# Patient Record
Sex: Male | Born: 1945 | Race: Black or African American | Hispanic: No | Marital: Married | Smoking: Former smoker
Health system: Southern US, Community
[De-identification: ages and names within clinical notes are randomized; demographics above are authoritative.]

## PROBLEM LIST (undated history)

## (undated) DIAGNOSIS — I214 Non-ST elevation (NSTEMI) myocardial infarction: Secondary | ICD-10-CM

## (undated) DIAGNOSIS — N189 Chronic kidney disease, unspecified: Secondary | ICD-10-CM

## (undated) DIAGNOSIS — I6529 Occlusion and stenosis of unspecified carotid artery: Secondary | ICD-10-CM

## (undated) DIAGNOSIS — I2510 Atherosclerotic heart disease of native coronary artery without angina pectoris: Secondary | ICD-10-CM

## (undated) DIAGNOSIS — D62 Acute posthemorrhagic anemia: Secondary | ICD-10-CM

## (undated) DIAGNOSIS — E119 Type 2 diabetes mellitus without complications: Secondary | ICD-10-CM

## (undated) DIAGNOSIS — I10 Essential (primary) hypertension: Secondary | ICD-10-CM

## (undated) DIAGNOSIS — J810 Acute pulmonary edema: Secondary | ICD-10-CM

## (undated) DIAGNOSIS — Z951 Presence of aortocoronary bypass graft: Secondary | ICD-10-CM

## (undated) HISTORY — PX: APPENDECTOMY: SHX54

## (undated) HISTORY — PX: JOINT REPLACEMENT: SHX530

## (undated) HISTORY — DX: Non-ST elevation (NSTEMI) myocardial infarction: I21.4

## (undated) HISTORY — DX: Acute posthemorrhagic anemia: D62

## (undated) HISTORY — DX: Acute pulmonary edema: J81.0

## (undated) HISTORY — PX: TONSILECTOMY, ADENOIDECTOMY, BILATERAL MYRINGOTOMY AND TUBES: SHX2538

## (undated) HISTORY — DX: Presence of aortocoronary bypass graft: Z95.1

---

## 2001-10-22 ENCOUNTER — Encounter: Admission: RE | Admit: 2001-10-22 | Discharge: 2001-10-22 | Payer: Self-pay | Admitting: Orthopaedic Surgery

## 2001-10-22 ENCOUNTER — Encounter: Payer: Self-pay | Admitting: Orthopaedic Surgery

## 2001-11-05 ENCOUNTER — Encounter: Admission: RE | Admit: 2001-11-05 | Discharge: 2001-11-05 | Payer: Self-pay | Admitting: Orthopedic Surgery

## 2001-11-05 ENCOUNTER — Encounter: Payer: Self-pay | Admitting: Orthopedic Surgery

## 2001-11-22 ENCOUNTER — Encounter: Admission: RE | Admit: 2001-11-22 | Discharge: 2001-11-22 | Payer: Self-pay | Admitting: Orthopaedic Surgery

## 2001-11-22 ENCOUNTER — Encounter: Payer: Self-pay | Admitting: Orthopaedic Surgery

## 2002-03-20 ENCOUNTER — Encounter: Payer: Self-pay | Admitting: Neurosurgery

## 2002-03-22 ENCOUNTER — Inpatient Hospital Stay (HOSPITAL_COMMUNITY): Admission: RE | Admit: 2002-03-22 | Discharge: 2002-03-24 | Payer: Self-pay | Admitting: Neurosurgery

## 2002-03-22 ENCOUNTER — Encounter: Payer: Self-pay | Admitting: Neurosurgery

## 2002-04-21 ENCOUNTER — Ambulatory Visit (HOSPITAL_COMMUNITY): Admission: RE | Admit: 2002-04-21 | Discharge: 2002-04-21 | Payer: Self-pay | Admitting: Neurosurgery

## 2002-04-21 ENCOUNTER — Encounter: Payer: Self-pay | Admitting: Neurosurgery

## 2002-04-22 ENCOUNTER — Inpatient Hospital Stay (HOSPITAL_COMMUNITY): Admission: RE | Admit: 2002-04-22 | Discharge: 2002-04-24 | Payer: Self-pay | Admitting: Neurosurgery

## 2003-02-15 HISTORY — PX: LUMBAR FUSION: SHX111

## 2003-07-06 ENCOUNTER — Ambulatory Visit (HOSPITAL_COMMUNITY): Admission: RE | Admit: 2003-07-06 | Discharge: 2003-07-06 | Payer: Self-pay | Admitting: Neurosurgery

## 2003-07-22 ENCOUNTER — Inpatient Hospital Stay (HOSPITAL_COMMUNITY): Admission: RE | Admit: 2003-07-22 | Discharge: 2003-07-26 | Payer: Self-pay | Admitting: Neurosurgery

## 2006-07-24 ENCOUNTER — Encounter: Admission: RE | Admit: 2006-07-24 | Discharge: 2006-07-24 | Payer: Self-pay | Admitting: Neurosurgery

## 2006-10-06 ENCOUNTER — Inpatient Hospital Stay (HOSPITAL_COMMUNITY): Admission: RE | Admit: 2006-10-06 | Discharge: 2006-10-10 | Payer: Self-pay | Admitting: Neurosurgery

## 2008-04-10 ENCOUNTER — Ambulatory Visit (HOSPITAL_COMMUNITY): Admission: RE | Admit: 2008-04-10 | Discharge: 2008-04-10 | Payer: Self-pay | Admitting: Ophthalmology

## 2010-06-01 LAB — BASIC METABOLIC PANEL
BUN: 27 mg/dL — ABNORMAL HIGH (ref 6–23)
Chloride: 105 mEq/L (ref 96–112)
GFR calc Af Amer: 43 mL/min — ABNORMAL LOW (ref >60)
Glucose, Bld: 187 mg/dL — ABNORMAL HIGH (ref 70–99)
Sodium: 138 mEq/L (ref 135–145)

## 2010-06-01 LAB — HEMOGLOBIN AND HEMATOCRIT, BLOOD
HCT: 30.6 % — ABNORMAL LOW (ref 39.0–52.0)
Hemoglobin: 10.6 g/dL — ABNORMAL LOW (ref 13.0–17.0)

## 2010-06-01 LAB — GLUCOSE, CAPILLARY: Glucose-Capillary: 113 mg/dL — ABNORMAL HIGH (ref 70–99)

## 2010-06-29 NOTE — Op Note (Signed)
[name], [name]                 ACCOUNT NO.:  [id number]   MEDICAL RECORD NO.:  [id number]          PATIENT TYPE:  INP   LOCATION:  2899                         FACILITY:  [facility]   PHYSICIAN:  Elizabeth Sauer, M.D.      DATE OF BIRTH:  April 16, 1945   DATE OF PROCEDURE:  10/06/2006  DATE OF DISCHARGE:                               OPERATIVE REPORT   PREOPERATIVE DIAGNOSIS:  Left L5 radiculopathy.   POSTOPERATIVE DIAGNOSIS:  Left L5 radiculopathy.   OPERATIVE PROCEDURE:  Exploration of left sided of L4-L5 fusion.   SURGEON:  Elizabeth Sauer, M.D.   ASSISTANT:  Marchia Meiers. Vertell Limber, M.D.   ANESTHESIA:  General endotracheal anesthesia.   PREPARATION:  Betadine prep with alcohol wipe.   COMPLICATIONS:  None.   BODY OF TEXT:  65 year old gentleman with left L5 radiculopathy and  status post Ray cage fusion at L4-L5 five years ago.  He is taken to the  operating room, smoothly anesthetized and intubated, placed prone on the  operating table. Following shave, prep, and drape in the usual sterile  fashion, the skin was incised in the old incision and the left side was  explored out to the transverse process of L5.  X-ray confirmed  correctness of the level. Working up over the pedicle of L5 and medially  to its medial aspect, which was the old superior articular process of  L5, the high speed drill was used to remove the medial portion as a  superior articular facet.  This allowed access to the lateral aspect of  the lateral recess just medial to the most medial portion of the  pedicle. The L5 nerve root was easily identified and carefully explored  along its length. Just anterior to it was a large mass of scar and  possible disc.  This was removed without difficulty and the nerve root  explored in all quadrants and found to be open.  Following complete  exploration, the wound was irrigated, hemostasis assured, the bone  defect filled with Depo-Medrol soaked fat.  Successive layers of 2-0  Vicryl and 3-0 nylon were used to close.  Betadine and Telfa dressing  was applied and made occlusive with OpSite.  The patient returned to the  recovery room in good condition.           ______________________________  Elizabeth Sauer, M.D.     MWR/MEDQ  D:  10/06/2006  T:  10/07/2006  Job:  [id number]

## 2010-06-29 NOTE — H&P (Signed)
[name], [name]                 ACCOUNT NO.:  [id number]   MEDICAL RECORD NO.:  [id number]          PATIENT TYPE:  INP   LOCATION:  3302                         FACILITY:  [facility]   PHYSICIAN:  Elizabeth Sauer, M.D.      DATE OF BIRTH:  January 21, 1946   DATE OF ADMISSION:  10/06/2006  DATE OF DISCHARGE:                              HISTORY & PHYSICAL   ADMITTING DIAGNOSIS:  Left L5 radiculopathy.   DICTATING PHYSICIAN:  Dr. Carloyn Manner.   SERVICES:  Neurosurgery.   BODY OF TEXT:  This is a very nice, now 65 year old, right-handed, black  gentleman, who I operated on 4 years ago.  He has had 3 disc operations  for recurrences at L4-5 and in 2004 underwent an L4-5 fusion.  He did  well and then he had increasing pain in his left calf.  MR has  demonstrated a narrowing of the left lateral recess at L4-5 and he is  now admitted for decompression of the left lateral recess and  exploration of his fusion.   PAST MEDICAL HISTORY:  Remarkable for:  1. Insulin dependent diabetes.  2. Hypertension.   MEDICATIONS:  He takes:  1. Humulin 70/30, 30 units in the morning and 20 in the evening.  2. Diltiazem 300 mg once a day.  3. Doxazosin 4 mg twice a day.  4. Coreg 12.5 mg twice a day.  5. Lisinopril, hydrochlorothiazide 20/25 once a day.  6. Aspirin a day.  7. He is also on Famvir.  8. Doxycycline.  9. Zantac.   ALLERGIES:  HE IS ALLERGIC TO SULFA.   PAST SURGICAL HISTORY:  A hip replacement, 3 times on the left and once  on the right.   SOCIAL HISTORY:  He does not smoke and does not drink.  He is on  disability.   FAMILY HISTORY:  Mom is 15 and in fair health with hypertension.  Dad  deceased at 65.   REVIEW OF SYSTEMS:  Remarkable for glasses, occasional infection,  tinnitus, balance disturbance, nasal congestion, sinus problems,  headache, chest pain, hypertension, leg pain, shortness of breath,  indigestion, difficulty with bowel habits, UTIs, leg weakness, back  pain, arm pain, leg  pain, joint pain, difficulties with memory,  difficulties with speech, double vision, diabetes, increased thirst and  inhalant allergies.   PHYSICAL EXAMINATION:  HEENT EXAM:  Within normal limits.  He has  reasonable range of motion.  NECK:  Clear.  CHEST:  Clear.  CARDIAC EXAM:  Regular rate and rhythm.  ABDOMEN:  Nontender with no hepatosplenomegaly.  EXTREMITIES:  Without clubbing or cyanosis.  GU EXAM:  Deferred.  EXTREMITIES:  Peripheral pulses are good.  NEUROLOGICALLY:  He is awake, alert and oriented.  Cranial nerves are  intact.  Motor exam shows 5/5 strength throughout the upper extremities  and lower extremities strength is full.  He has a flicker left ankle  jerk.  Ankle jerk is more on the right.  Straight leg raise on the left  is positive.   His MR demonstrates a mass in the left lateral recess, just medial  to  the left L5 pedicle.   CLINICAL IMPRESSION:  Left lumbar radiculopathy.   PLAN:  Exploration of the left lateral recess at the L4-5 level.  The  risks and benefits have been discussed with him and he wished to  proceed.           ______________________________  Elizabeth Sauer, M.D.     MWR/MEDQ  D:  10/06/2006  T:  10/07/2006  Job:  [id number]

## 2010-06-29 NOTE — Discharge Summary (Signed)
NAMEBRAYSON, SHERFEY                 ACCOUNT NO.:  [id number]   MEDICAL RECORD NO.:  [id number]          PATIENT TYPE:  INP   LOCATION:  3031                         FACILITY:  [facility]   PHYSICIAN:  Elizabeth Sauer, M.D.      DATE OF BIRTH:  May 10, 1945   DATE OF ADMISSION:  10/06/2006  DATE OF DISCHARGE:  10/10/2006                               DISCHARGE SUMMARY   ADMITTING DIAGNOSIS:  Left L5 radiculopathy.   DISCHARGE DIAGNOSIS:  Left L5 radiculopathy.   PROCEDURE:  Left L4-5 foraminotomy.   SURGEON:  Dr. Carloyn Manner   COMPLICATIONS:  None.   DISCHARGE STATUS:  Alive and well.   A 65 year old right-handed black gentleman whose history and physical is  recounted in the chart.  He has had fusion at 4-5, developed left L5  radiculopathy.  MR showed entrapment of the left L5 root and he was  admitted for decompression.   HISTORY:  Just remarkable for diabetes and hypertension.   Exam was intact save for the left L5 radiculopathy.   He was admitted after ascertaining normal laboratory values and  underwent a 4-5 decompression.   Postoperatively, he has done well.  He spent one or two days in the ACU  monitoring his sugars, seem to be doing well there.  He has been up and  about walking with PT with marked diminution of his left leg pain.  His  incision is dry and well healing; his strength is full and his sugars  are good.   He is being discharged home in the care of his family with Percocet for  pain.  His followup will be in the [facility] in a week for  sutures.           ______________________________  Elizabeth Sauer, M.D.     MWR/MEDQ  D:  10/10/2006  T:  10/10/2006  Job:  [id number]

## 2010-07-02 NOTE — H&P (Signed)
NAME:  [name], [name]                           ACCOUNT NO.:  [id number]   MEDICAL RECORD NO.:  [id number]                   PATIENT TYPE:  OIB   LOCATION:  3008                                 FACILITY:  [facility]   PHYSICIAN:  Elizabeth Sauer, M.D.                   DATE OF BIRTH:  [birth year]   DATE OF ADMISSION:  04/22/2002  DATE OF DISCHARGE:                                HISTORY & PHYSICAL   ADMISSION DIAGNOSIS:  Epidural abscess.   HISTORY OF PRESENT ILLNESS:  This 65 year old right-handed black gentleman  who had discectomy a little over a month ago.  He has been doing relatively  well.  He has developed some pain in his left leg that has become  intractable.  A MRI demonstrated an enhancing lesion at L4/5 and he is  admitted for exploration of this lesion.   PAST MEDICAL HISTORY:  Remarkable for insulin-dependent diabetes mellitus,  hypertension.   MEDICATIONS:  1. He takes Humulin 70/30 with 30 in the morning and 20 in the evening.  2. Diltiazem 300 mg 1 q.d.  3. Doxazosin 4 mg b.i.d.  4. Coreg 12.5 mg b.i.d.  5. Lisinopril/hydrochlorothiazide 20/25 mg 1 q.d.  6. Aspirin q.d.  7. Legatrin.  8. Stool softener.  9. Famvir 500 mg b.i.d.  10.      Doxycycline 100 mg b.i.d. for his prostate.  11.      Zantac 75  mg p.r.n. basis.   ALLERGIES:  SULFA.   PAST SURGICAL HISTORY:  Hip replacement two or three times on the left and  once on the right.   SOCIAL HISTORY:  Does not smoke and does not drink.  He is on disability.   FAMILY HISTORY:  Mom is 69.  She has a history of hypertension.  Dad is  deceased at 25, cause was not given.   REVIEW OF SYMPTOMS:  Remarkable for glasses and occasional infection.  Sinus  problems.  Headache.  Chest pain.  Hypertension.  Leg pain.  Shortness of  breath.  Indigestion.  Difficulty with bowel habits.  Urinary tract  infection.  Insomnia.  Extreme leg weakness, back pain, arm pain, leg pain,  and joint pain.  Difficulty with memory.   Difficulty with speech and vision.  Diabetes with increased thirst.   ALLERGIES:  INHALANT ALLERGIES.   PHYSICAL EXAMINATION:  HEENT:  Normal.  NECK:  Reasonable range of motion.  BACK:  Clear.  CHEST:  Clear.  CARDIOVASCULAR:  Regular rate and rhythm.  ABDOMEN:  Nontender with no hepatosplenomegaly.  EXTREMITIES:  Without clubbing or cyanosis.  GENITOURINARY:  Deferred.  PULSES:  Good.  NEUROLOGICAL:  Awake, alert, and oriented.  Cranial nerves are intact.  Motor exam shows 5/5 strength throughout the upper and lower extremities  save for the flexion weakness on the left side.  He is still not tripping  over it.  Left foot is numb in the L5 and S1 distribution.  Reflexes are 2  at each knee, 1 to right ankle, absent at the left.   LABORATORY DATA:  MRA results have been reviewed above.   IMPRESSION:  Possible epidural abscess.   PLAN:  Re-exploration of the lumbar wound.  The risks and benefits of this  approach have been discussed and he wishes to proceed.                                               Elizabeth Sauer, M.D.    MWR/MEDQ  D:  04/22/2002  T:  04/22/2002  Job:  [id number]

## 2010-07-02 NOTE — H&P (Signed)
NAME:  JESSIAH, HIPPLE                           ACCOUNT NO.:  [id number]   MEDICAL RECORD NO.:  [id number]                   PATIENT TYPE:  INP   LOCATION:  [id number]                                 FACILITY:  [facility]   PHYSICIAN:  Elizabeth Sauer, M.D.                   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   This very  nice 65 year old right-handed black gentleman who had a  discectomy and a redo discectomy a year ago, at that time it was discussed  whether or not it was appropriate to diffuse him, he declined that.  He has  had marked increase in right leg pain, an MRI that shows another disc  recurrence.  He is now admitted for laminectomy and discectomy at 4-5.   PAST MEDICAL HISTORY:  1. Insulin dependent diabetes.  2. Hypertension.   MEDICATIONS:  1. He takes Humulin 70/30 30 in the morning and 20 in the evening.  2. Diltiazem 30 mg a day.  3. Doxycin 4 mg b.i.d.  4. Coreg 12.5 mg b.i.d.  5. Lisinopril/hydrochlorothiazide 20/25 once a day.  6. Aspirin a day.  7. Legatrin.  8. Stool softener.  9. Pamvir 500 mg b.i.d.  10.      Doxycycline 100 mg b.i.d. for prostate.  11.      Zantac 75 mg on a p.r.n. basis.   ALLERGIES:  SULFA.   PAST SURGICAL HISTORY:  Hip replacement two or three times on the left and  once on the right.   SOCIAL HISTORY:  Does not smoke.  Does not drink.  He is on disability.   FAMILY HISTORY:  Mom is 91 with history of hypertension.  Dad deceased at  51, cause not given.   REVIEW OF SYMPTOMS:  Remarkable for glasses and occasional infections, sinus  problems, headache, chest pain, hypertension, leg pain, shortness of breath,  indigestion, difficulty with bowel habits, urinary tract infection,  insomnia, leg weakness, back pain, arm pain, leg pain, joint pain,  difficulty with memory,  difficulty with speech and vision, and diabetes.   PHYSICAL EXAMINATION:  HEENT:  Within normal limits.  NECK:  He has reasonable range of motion of the neck.  CHEST:  Clear.  CARDIOVASCULAR:  Regular rate and rhythm.  ABDOMEN:  Large but nontender with no hepatosplenomegaly.  EXTREMITIES:  Without clubbing or cyanosis.  GU:  Examination is deferred.  VASCULAR:  Peripheral pulses are good.  NEUROLOGIC:  He is awake, alert and oriented. Cranial nerves are intact.  Motor examination shows 5/5 strength throughout the upper and lower  extremities save for dorsiflexion of the left side.  Left foot is numb in 4-  5 and S1 distribution.  Reflexes are 2 at  each knee, 1 at the right ankle,  absent at the left.  Straight leg raising is positive.   MR demonstrates recurrent disc at 4-5.   IMPRESSION:  Left L5 radiculopathy related to herniated disc.   PLAN:  Laminectomy, discectomy, posterior lumbar interbody fusion L4-5.  The  risks and benefits of this approach have been discussed with him and he  wishes to proceed.                                                Elizabeth Sauer, M.D.    MWR/MEDQ  D:  07/22/2003  T:  07/23/2003  Job:  [id number]

## 2010-07-02 NOTE — Op Note (Signed)
NAME:  BUN, RINNE                           ACCOUNT NO.:  [id number]   MEDICAL RECORD NO.:  [id number]                   PATIENT TYPE:  INP   LOCATION:  [id number]                                 FACILITY:  [facility]   PHYSICIAN:  Elizabeth Sauer, M.D.                   DATE OF BIRTH:  06-20-1945   DATE OF PROCEDURE:  07/22/2003  DATE OF DISCHARGE:                                 OPERATIVE REPORT   PREOPERATIVE DIAGNOSIS:  Recurrent herniated disk, L4-5.   POSTOPERATIVE DIAGNOSIS:  Recurrent herniated disk, L4-5.   OPERATIVE PROCEDURE:  L4-5 laminectomy and diskectomy, posterior lumbar  interbody fusion with Ray Threaded Fusion Cages, posterolateral arthrodesis.   SURGEON:  Elizabeth Sauer, M.D.   NURSE ASSISTANT:  [facility].   DOCTOR ASSISTANT:  Leeroy Cha, M.D.   PREPARATION:  Sterile Betadine prep and scrub with alcohol wipe.   BODY OF TEXT:  This is a 65 year old gentleman with recurrent large disk at  L4-5.  Taken to the operating room and smoothly anesthetized and intubated,  placed prone on the operating table.  Following shave, prep, and drape in  the usual sterile fashion, the skin was infiltrated with 1% lidocaine and  1:400,000 epinephrine.  The skin was incised and the old incision extended  approximately 2 cm at each end.  The laminae of L4 and L5 were exposed  bilaterally in the subperiosteal plane out over the transverse processes of  L4 and L5.  An intraoperative x-ray confirmed correctness of level.  Having  confirmed correctness of level, the pars interarticularis, lamina, and  inferior facet of L4 and the superior facet of L5 were removed bilaterally.  The pars interarticularis on the left side at the site of his old  laminectomy was fractured.  Following complete removal of these, a large  herniated disk was recovered from underneath the left-sided L5 nerve root.  The disk space was completely evacuated.  This was done from both sides.  Ray Threaded Fusion Cages 14 x  26 mm were placed and intraoperative x-ray  showed good placement of the cages.  They were packed with bone graft  harvested from the facet joint.  Morcellized allograft was then mixed with  DBX and placed in the intertransverse position following decortication with  a high-speed drill.  The fascia was reapproximated with 0 Vicryl in  interrupted fashion, the subcutaneous tissue was reapproximated with 0  Vicryl in interrupted fashion, the subcuticular tissue was reapproximated  with 3-0 Vicryl in interrupted fashion.  The skin was closed with 3-0 nylon  in running locked fashion.  A Betadine and Telfa dressing was applied and  made occlusive with OpSite, and the patient returned to the recovery room in  good condition.  Elizabeth Sauer, M.D.    MWR/MEDQ  D:  07/22/2003  T:  07/23/2003  Job:  [id number]

## 2010-07-02 NOTE — Op Note (Signed)
NAME:  [name], [name]                           ACCOUNT NO.:  [id number]   MEDICAL RECORD NO.:  [id number]                   PATIENT TYPE:  INP   LOCATION:  2899                                 FACILITY:  [facility]   PHYSICIAN:  Elizabeth Sauer, M.D.                   DATE OF BIRTH:  [birth year]   DATE OF PROCEDURE:  03/22/2002  DATE OF DISCHARGE:                                 OPERATIVE REPORT   PREOPERATIVE DIAGNOSIS:  Herniated disk on the left side at L4-5.   POSTOPERATIVE DIAGNOSIS:  Herniated disk on the left side at L4-5.   PROCEDURE:  Left L4-5 laminectomy and diskectomy.   SURGEON:  Elizabeth Sauer, M.D.   ASSISTANTS:  Nurse assistant:  Chyrl Civatte.  Doctor assistant:  Ophelia Charter, M.D.   ANESTHESIA:  General endotracheal.   PREPARATION:  Sterile Betadine prep and scrub with alcohol wipe.   COMPLICATIONS:  None.   DESCRIPTION OF PROCEDURE:  This is a 65 year old gentleman with a left L5  radiculopathy secondary to a herniated disk and spondylosis.  Taken to the  operating room and smoothly anesthetized and intubated, placed prone on the  operating table.  Following shave, prep, and drape in the usual sterile  fashion, the skin was infiltrated with 1% lidocaine and 1:400,000  epinephrine.  The skin was incised from mid-L4 to mid-L5.  The lamina of L4  was dissected free on the left side.  Intraoperative x-ray confirmed  correctness of level.  Hemisemilaminectomy of L5 was carried out to the top  of the ligamentum flavum that was removed in a retrograde fashion.  This  uncovered the lateral aspect of the left L5 root.  This was retracted  medially.  Immediately obvious was degenerated disk with a bulging annulus  and some protrusion of nuclear material through the annular fibers.  The  remaining annular fibers were divided and the disk space evacuated with the  [facility] pituitary instrumentation.  The end plates were gently curetted to  dislodge any marginally clinging  fragments.  Having completed evacuation of  all graspable fragments from the disk space, the nerve root was carefully  explored in all quadrants and found to be free, as was the anterior epidural  space.  The wound was irrigated and hemostasis assured.  The laminectomy  defect filled with Depo-Medrol-soaked fat.  The fascia was approximated with  0 Vicryl in an interrupted fashion, the subcutaneous tissue was  reapproximated with 0 Vicryl in interrupted fashion, the subcuticular tissue  was reapproximated with 2-0 Vicryl in interrupted fashion.  The skin was  closed with 3-0 nylon in a running locked fashion.  A Betadine and Telfa  dressing was applied and made occlusive with OpSite and the patient returned  to the recovery room in good condition.  Elizabeth Sauer, M.D.    MWR/MEDQ  D:  03/22/2002  T:  03/23/2002  Job:  [id number]

## 2010-07-02 NOTE — H&P (Signed)
NAME:  [name], [name]                           ACCOUNT NO.:  [id number]   MEDICAL RECORD NO.:  [id number]                   PATIENT TYPE:  INP   LOCATION:  2899                                 FACILITY:  [facility]   PHYSICIAN:  Elizabeth Sauer, M.D.                   DATE OF BIRTH:  04-Sep-1945   DATE OF ADMISSION:  03/22/2002  DATE OF DISCHARGE:                                HISTORY & PHYSICAL   ADMISSION DIAGNOSIS:  Herniated disk L4-5.   HISTORY AND PHYSICAL:  This is a very nice 65 year old right handed black  gentleman who has pain in his left leg that has been going on for about a  year.  He can hardly get up and walk with it.  He has had a hip replacement  several times on that side.  This is different, associated with numbness on  the right side, however, he has had a hip replacement and it does not seem  to trouble him as much.  MRI shows lumbar disk at L4-5 and he was referred  to me.   PAST MEDICAL HISTORY:  Remarkable for insulin-dependent diabetes mellitus  and hypertension.   MEDICATIONS:  Humulin 70/30 30 in the morning and 20 in the evening,  Diltiazem 300 mg once a day, Doxazocin 4 mg b.i.d., Coreg 12.5 mg b.i.d.,  Lisinopril-hydrochlorothiazide 20/25 once a day, an aspirin a day, Legatrin  stool softener, Dulcolax, Famvir b.i.d. for genital herpes, Doxycycline 100  mg b.i.d. for his prostate, Zantac 75 mg on a p.r.n. basis.   ALLERGIES:  Sulfa.   SURGICAL HISTORY:  Hip replacement two or three times on the left and once  on the right.   SOCIAL HISTORY:  He does not smoke and does not drink.  He is on disability.   FAMILY HISTORY:  His Mom is 69 and has hypertension.  Dad is deceased at 59,  cause is not given.   REVIEW OF SYSTEMS:  Remarkable for glasses.  Occasional infection.  Tinnitus.  Bouts of nasal congestion, sinus problems with headache, chest  pain, hypertension, leg pain, shortness of breath, indigestion, difficulty  with urinary tract infections,  starting or stopping stream, leg weakness,  back pain, arm pain, leg pain, joint pain, difficulty with memory,  difficulty with speech.  Diabetes, increased thirst and inhalant allergies.   PHYSICAL EXAMINATION:   HEENT:  Within normal limits.   NECK:  He has reasonable range of motion of his neck.   CHEST:  Clear.   CARDIAC:  Exam is regular rate and rhythm.   ABDOMEN:  Nontender with no hepatosplenomegaly.   EXTREMITIES:  Without clubbing, cyanosis or edema.  Peripheral pulses are  good.   GU EXAM:  Deferred.   NEUROLOGIC:  He is awake, alert and oriented.  Cranial nerves are intact.  Motor exam shows 5/5 strength throughout upper and lower extremities.  Hindered  dorsiflexion movements on the left side with no notice of tripping  over it.  The left foot is numb in an L5-S1 distribution, reflexes are 2 at  each knee, one at the right ankle, absent at the left ankle.   Plain films demonstrate good preservation of disk space.  However, MRA  demonstrates disk protrusion centrally with some accompanying center  arthropathy causing left foraminal narrowing.   CLINICAL IMPRESSION:  Left L5 radiculopathy.   PLAN:  He has little back pain, I do not think this is reason for a fusion.  He has not done well with epidural steroids so the plan is for a lumbar  laminectomy, diskectomy on the left side.  The risks and benefits of this  approach have been discussed with him and he wishes to proceed.                                                 Elizabeth Sauer, M.D.    MWR/MEDQ  D:  03/22/2002  T:  03/22/2002  Job:  [id number]

## 2010-07-02 NOTE — Discharge Summary (Signed)
NAME:  ENGLAND, SCHWIESOW                           ACCOUNT NO.:  [id number]   MEDICAL RECORD NO.:  [id number]                   PATIENT TYPE:  INP   LOCATION:  3030                                 FACILITY:  [facility]   PHYSICIAN:  Elizabeth Sauer, M.D.                   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   PROCEDURES:  L4-5 laminectomy and diskectomy, posterior interbody fusion  with Ray fusion cages, and posterolateral arthrodesis.   COMPLICATIONS:  None.   DISCHARGE STATUS:  Alive and well.   HISTORY OF PRESENT ILLNESS:  A 65 year old, right-handed, black gentleman,  whose history and physical is recounted on the chart.  He has had several  disk operations and has a large recurrence at L4-5 with difficulty with its  gait and bilateral dorsiflexion weakness.  He is admitted with fusion.   PAST MEDICAL HISTORY:  Recounted on the chart.  Basically has insulin-  dependent diabetes and hypertension.  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   MEDICATIONS:  He is on Humulin and diltiazem.   ALLERGIES:  SULFA.   PHYSICAL EXAMINATION:  General exam was intact with bilateral hip  replacements.  On neurologic exam, he had dorsiflexion and weakness of both  feet.   HOSPITAL COURSE:  He was admitted after ascertainment of acceptable  laboratory values.  He was taken to the operating room and under general  anesthesia underwent an L3-4 diskectomy and fusion.  Postoperatively he has  done well save for difficulty with voiding.  The Foley was removed.  He was  unable to void.  It was placed.  I consulted urology, who recommended  sending him home with a leg bag and contact his urologist, Dr. Janice Norrie, upon  discharge.   DISPOSITION:  He is now being discharged  home with oral medications, having  completed physical therapy, competent with his activities of daily living,  and eating and voiding normally.   DISCHARGE MEDICATIONS:  He is being discharged home with Percocet for pain.   FOLLOWUP:  His followup will be in the [facility]  office in about a week for suture removal.                                                Elizabeth Sauer, M.D.    MWR/MEDQ  D:  07/26/2003  T:  07/27/2003  Job:  [id number]

## 2010-07-02 NOTE — Op Note (Signed)
NAME:  [name], [name]                             ACCOUNT NO.:  [id number]   MEDICAL RECORD NO.:  [id number]                   PATIENT TYPE:  OIB   LOCATION:  3008                                 FACILITY:  [facility]   PHYSICIAN:  Elizabeth Sauer, M.D.                   DATE OF BIRTH:  1945-11-12   DATE OF PROCEDURE:  04/22/2002  DATE OF DISCHARGE:                                 OPERATIVE REPORT   PREOPERATIVE DIAGNOSIS:  Left-sided spinal epidural abscess.   POSTOPERATIVE DIAGNOSIS:  Recurrent herniated disk on the left side at L4-5.   OPERATION PERFORMED:  Exploration of left L4-5 laminectomy.   SURGEON:  Elizabeth Sauer, M.D.   ANESTHESIA:  General endotracheal.   PREP:  Sterile Betadine prep and scrub with alcohol wipe.   COMPLICATIONS:  None.   ASSISTANT:  [place].   INDICATIONS FOR PROCEDURE:  The patient is a 65 year old gentleman who had a  4-5 diskectomy done about a month.  She has had increasing pain in his left  leg and had a question of an epidural abscess.   DESCRIPTION OF PROCEDURE:  The patient was taken to the operating room,  smoothly anesthetized and placed  prone on the operating table.  Following  shave, prep and drape in the usual sterile fashion, the old skin incision  was reopened and the scar tissue rapidly dissected down to the laminectomy  at L4-5 on the left side.  The laminectomy site was carefully delineated,  dissected free.  The epidural fat patch was removed and the dura was  immediately evident.  No pus was encountered.  Gentle dissection and lysis  of scar of the dura from the laminectomy defect permitted slight extension  of laminectomy superiorly.  Identifying the top of the L5 nerve root,  careful dissection was carried down the nerve root with lysis of underlying  material.  At no time was any pus encountered.  Following dissection, the  nerve root was retracted medially and a small appearing piece of disk  material was evident under the nerve  root.  It was grasped with the Hartman  forceps and removed and was productive of a thumbnail size piece of disk  that extended above the disk space.  Exploring inferior to the disk space,  similar piece of disk material was found.  The disk space itself had several  fragments which were removed without difficulty.  The entire anterior  epidural space along the nerve root was then carefully explored and found to  be free and the nerve root was found to be free as it traversed the pedicle.  The wound was irrigated and hemostasis assured.  Depo-Medrol soaked fat was  once again placed in the laminectomy defect.  The fascia was reapproximated  with 0 Vicryl in running and  interrupted fashion.  Subcutaneous tissue was reapproximated with 0 Vicryl  in  interrupted fashion.  The skin was closed with 3-0 nylon in a running  locked fashion.  Betadine Telfa dressing was applied.  The patient had a  Foley catheter placed and returned to the recovery room in good condition.                                                Elizabeth Sauer, M.D.    MWR/MEDQ  D:  04/22/2002  T:  04/23/2002  Job:  [id number]

## 2010-11-26 LAB — URINE MICROSCOPIC-ADD ON

## 2010-11-26 LAB — TYPE AND SCREEN: ABO/RH(D): A POS

## 2010-11-26 LAB — DIFFERENTIAL
Basophils Absolute: 0
Basophils Relative: 1
Eosinophils Absolute: 0.4
Eosinophils Relative: 6 — ABNORMAL HIGH
Monocytes Absolute: 0.5
Neutro Abs: 4
Neutrophils Relative %: 63

## 2010-11-26 LAB — COMPREHENSIVE METABOLIC PANEL
Alkaline Phosphatase: 51
BUN: 20
Calcium: 9.6
Chloride: 103
Creatinine, Ser: 1.8 — ABNORMAL HIGH
GFR calc Af Amer: 47 — ABNORMAL LOW
Glucose, Bld: 142 — ABNORMAL HIGH
Sodium: 137
Total Bilirubin: 0.4

## 2010-11-26 LAB — CBC
MCV: 90.5
Platelets: 244

## 2010-11-26 LAB — URINALYSIS, ROUTINE W REFLEX MICROSCOPIC

## 2010-11-26 LAB — POCT I-STAT GLUCOSE: Operator id: 122891

## 2010-11-26 LAB — PROTIME-INR: INR: 1

## 2012-11-28 ENCOUNTER — Other Ambulatory Visit (HOSPITAL_COMMUNITY): Payer: Self-pay | Admitting: Internal Medicine

## 2012-11-28 DIAGNOSIS — R0989 Other specified symptoms and signs involving the circulatory and respiratory systems: Secondary | ICD-10-CM

## 2012-12-24 ENCOUNTER — Ambulatory Visit (HOSPITAL_COMMUNITY)
Admission: RE | Admit: 2012-12-24 | Discharge: 2012-12-24 | Disposition: A | Discharge status: Home / Self Care | Payer: Medicare Other | Source: Ambulatory Visit | Attending: Internal Medicine | Admitting: Internal Medicine

## 2012-12-24 DIAGNOSIS — I379 Nonrheumatic pulmonary valve disorder, unspecified: Secondary | ICD-10-CM | POA: Insufficient documentation

## 2012-12-24 DIAGNOSIS — R0989 Other specified symptoms and signs involving the circulatory and respiratory systems: Secondary | ICD-10-CM

## 2012-12-24 DIAGNOSIS — I517 Cardiomegaly: Secondary | ICD-10-CM

## 2012-12-24 DIAGNOSIS — R0609 Other forms of dyspnea: Secondary | ICD-10-CM | POA: Insufficient documentation

## 2012-12-24 DIAGNOSIS — I079 Rheumatic tricuspid valve disease, unspecified: Secondary | ICD-10-CM | POA: Insufficient documentation

## 2012-12-24 NOTE — Progress Notes (Signed)
VASCULAR LAB PRELIMINARY  PRELIMINARY  PRELIMINARY  PRELIMINARY  Carotid Dopplers completed.    Preliminary report:  1-39% ICA stenosis.  Vertebral artery flow is antegrade.  Ticara Waner, RVT 12/24/2012, 11:02 AM

## 2012-12-24 NOTE — Progress Notes (Signed)
*  PRELIMINARY RESULTS* Echocardiogram 2D Echocardiogram has been performed.  Leavy Cella 12/24/2012, 12:22 PM

## 2013-04-01 ENCOUNTER — Other Ambulatory Visit (HOSPITAL_COMMUNITY): Payer: Self-pay | Admitting: Nephrology

## 2013-04-01 ENCOUNTER — Ambulatory Visit (HOSPITAL_COMMUNITY)
Admission: RE | Admit: 2013-04-01 | Discharge: 2013-04-01 | Disposition: A | Discharge status: Home / Self Care | Payer: Medicare Other | Source: Ambulatory Visit | Attending: Surgery | Admitting: Surgery

## 2013-04-01 DIAGNOSIS — I701 Atherosclerosis of renal artery: Secondary | ICD-10-CM | POA: Insufficient documentation

## 2013-04-01 DIAGNOSIS — N189 Chronic kidney disease, unspecified: Secondary | ICD-10-CM

## 2013-04-01 DIAGNOSIS — Q619 Cystic kidney disease, unspecified: Secondary | ICD-10-CM | POA: Insufficient documentation

## 2013-04-01 DIAGNOSIS — N19 Unspecified kidney failure: Secondary | ICD-10-CM | POA: Insufficient documentation

## 2013-04-03 ENCOUNTER — Encounter: Payer: Self-pay | Admitting: Nephrology

## 2014-02-17 DIAGNOSIS — I129 Hypertensive chronic kidney disease with stage 1 through stage 4 chronic kidney disease, or unspecified chronic kidney disease: Secondary | ICD-10-CM | POA: Diagnosis not present

## 2014-02-17 DIAGNOSIS — E859 Amyloidosis, unspecified: Secondary | ICD-10-CM | POA: Diagnosis not present

## 2014-02-17 DIAGNOSIS — R809 Proteinuria, unspecified: Secondary | ICD-10-CM | POA: Diagnosis not present

## 2014-02-17 DIAGNOSIS — N183 Chronic kidney disease, stage 3 (moderate): Secondary | ICD-10-CM | POA: Diagnosis not present

## 2014-02-17 DIAGNOSIS — Z79899 Other long term (current) drug therapy: Secondary | ICD-10-CM | POA: Diagnosis not present

## 2014-02-17 DIAGNOSIS — D649 Anemia, unspecified: Secondary | ICD-10-CM | POA: Diagnosis not present

## 2014-02-18 DIAGNOSIS — I10 Essential (primary) hypertension: Secondary | ICD-10-CM | POA: Diagnosis not present

## 2014-02-18 DIAGNOSIS — D509 Iron deficiency anemia, unspecified: Secondary | ICD-10-CM | POA: Diagnosis not present

## 2014-02-18 DIAGNOSIS — E1129 Type 2 diabetes mellitus with other diabetic kidney complication: Secondary | ICD-10-CM | POA: Diagnosis not present

## 2014-02-18 DIAGNOSIS — N183 Chronic kidney disease, stage 3 (moderate): Secondary | ICD-10-CM | POA: Diagnosis not present

## 2014-04-03 DIAGNOSIS — R351 Nocturia: Secondary | ICD-10-CM | POA: Diagnosis not present

## 2014-04-03 DIAGNOSIS — R350 Frequency of micturition: Secondary | ICD-10-CM | POA: Diagnosis not present

## 2014-04-03 DIAGNOSIS — N401 Enlarged prostate with lower urinary tract symptoms: Secondary | ICD-10-CM | POA: Diagnosis not present

## 2014-04-21 DIAGNOSIS — I10 Essential (primary) hypertension: Secondary | ICD-10-CM | POA: Diagnosis not present

## 2014-04-21 DIAGNOSIS — Z125 Encounter for screening for malignant neoplasm of prostate: Secondary | ICD-10-CM | POA: Diagnosis not present

## 2014-04-21 DIAGNOSIS — D638 Anemia in other chronic diseases classified elsewhere: Secondary | ICD-10-CM | POA: Diagnosis not present

## 2014-04-21 DIAGNOSIS — E119 Type 2 diabetes mellitus without complications: Secondary | ICD-10-CM | POA: Diagnosis not present

## 2014-04-21 DIAGNOSIS — E039 Hypothyroidism, unspecified: Secondary | ICD-10-CM | POA: Diagnosis not present

## 2014-04-21 DIAGNOSIS — D649 Anemia, unspecified: Secondary | ICD-10-CM | POA: Diagnosis not present

## 2014-05-07 DIAGNOSIS — H2511 Age-related nuclear cataract, right eye: Secondary | ICD-10-CM | POA: Diagnosis not present

## 2014-05-07 DIAGNOSIS — H35372 Puckering of macula, left eye: Secondary | ICD-10-CM | POA: Diagnosis not present

## 2014-05-07 DIAGNOSIS — Z961 Presence of intraocular lens: Secondary | ICD-10-CM | POA: Diagnosis not present

## 2014-05-07 DIAGNOSIS — E119 Type 2 diabetes mellitus without complications: Secondary | ICD-10-CM | POA: Diagnosis not present

## 2014-05-16 DIAGNOSIS — E162 Hypoglycemia, unspecified: Secondary | ICD-10-CM | POA: Diagnosis not present

## 2014-05-16 DIAGNOSIS — R404 Transient alteration of awareness: Secondary | ICD-10-CM | POA: Diagnosis not present

## 2014-05-19 DIAGNOSIS — N183 Chronic kidney disease, stage 3 (moderate): Secondary | ICD-10-CM | POA: Diagnosis not present

## 2014-05-19 DIAGNOSIS — I129 Hypertensive chronic kidney disease with stage 1 through stage 4 chronic kidney disease, or unspecified chronic kidney disease: Secondary | ICD-10-CM | POA: Diagnosis not present

## 2014-05-19 DIAGNOSIS — R809 Proteinuria, unspecified: Secondary | ICD-10-CM | POA: Diagnosis not present

## 2014-05-19 DIAGNOSIS — E559 Vitamin D deficiency, unspecified: Secondary | ICD-10-CM | POA: Diagnosis not present

## 2014-05-19 DIAGNOSIS — Z79899 Other long term (current) drug therapy: Secondary | ICD-10-CM | POA: Diagnosis not present

## 2014-05-19 DIAGNOSIS — D649 Anemia, unspecified: Secondary | ICD-10-CM | POA: Diagnosis not present

## 2014-05-20 DIAGNOSIS — E1129 Type 2 diabetes mellitus with other diabetic kidney complication: Secondary | ICD-10-CM | POA: Diagnosis not present

## 2014-05-20 DIAGNOSIS — I509 Heart failure, unspecified: Secondary | ICD-10-CM | POA: Diagnosis not present

## 2014-05-20 DIAGNOSIS — N183 Chronic kidney disease, stage 3 (moderate): Secondary | ICD-10-CM | POA: Diagnosis not present

## 2014-05-20 DIAGNOSIS — I10 Essential (primary) hypertension: Secondary | ICD-10-CM | POA: Diagnosis not present

## 2014-05-20 DIAGNOSIS — D509 Iron deficiency anemia, unspecified: Secondary | ICD-10-CM | POA: Diagnosis not present

## 2014-07-17 DIAGNOSIS — I129 Hypertensive chronic kidney disease with stage 1 through stage 4 chronic kidney disease, or unspecified chronic kidney disease: Secondary | ICD-10-CM | POA: Diagnosis not present

## 2014-07-17 DIAGNOSIS — R809 Proteinuria, unspecified: Secondary | ICD-10-CM | POA: Diagnosis not present

## 2014-07-17 DIAGNOSIS — Z79899 Other long term (current) drug therapy: Secondary | ICD-10-CM | POA: Diagnosis not present

## 2014-07-17 DIAGNOSIS — N183 Chronic kidney disease, stage 3 (moderate): Secondary | ICD-10-CM | POA: Diagnosis not present

## 2014-07-17 DIAGNOSIS — D649 Anemia, unspecified: Secondary | ICD-10-CM | POA: Diagnosis not present

## 2014-07-17 DIAGNOSIS — E859 Amyloidosis, unspecified: Secondary | ICD-10-CM | POA: Diagnosis not present

## 2014-07-22 DIAGNOSIS — E1129 Type 2 diabetes mellitus with other diabetic kidney complication: Secondary | ICD-10-CM | POA: Diagnosis not present

## 2014-07-22 DIAGNOSIS — I10 Essential (primary) hypertension: Secondary | ICD-10-CM | POA: Diagnosis not present

## 2014-07-22 DIAGNOSIS — D509 Iron deficiency anemia, unspecified: Secondary | ICD-10-CM | POA: Diagnosis not present

## 2014-07-22 DIAGNOSIS — N183 Chronic kidney disease, stage 3 (moderate): Secondary | ICD-10-CM | POA: Diagnosis not present

## 2014-08-20 DIAGNOSIS — I10 Essential (primary) hypertension: Secondary | ICD-10-CM | POA: Diagnosis not present

## 2014-08-20 DIAGNOSIS — N183 Chronic kidney disease, stage 3 (moderate): Secondary | ICD-10-CM | POA: Diagnosis not present

## 2014-08-20 DIAGNOSIS — D649 Anemia, unspecified: Secondary | ICD-10-CM | POA: Diagnosis not present

## 2014-08-20 DIAGNOSIS — E039 Hypothyroidism, unspecified: Secondary | ICD-10-CM | POA: Diagnosis not present

## 2014-08-20 DIAGNOSIS — E119 Type 2 diabetes mellitus without complications: Secondary | ICD-10-CM | POA: Diagnosis not present

## 2014-08-25 DIAGNOSIS — M47816 Spondylosis without myelopathy or radiculopathy, lumbar region: Secondary | ICD-10-CM | POA: Diagnosis not present

## 2014-10-15 DIAGNOSIS — R809 Proteinuria, unspecified: Secondary | ICD-10-CM | POA: Diagnosis not present

## 2014-10-15 DIAGNOSIS — E559 Vitamin D deficiency, unspecified: Secondary | ICD-10-CM | POA: Diagnosis not present

## 2014-10-15 DIAGNOSIS — I129 Hypertensive chronic kidney disease with stage 1 through stage 4 chronic kidney disease, or unspecified chronic kidney disease: Secondary | ICD-10-CM | POA: Diagnosis not present

## 2014-10-15 DIAGNOSIS — D649 Anemia, unspecified: Secondary | ICD-10-CM | POA: Diagnosis not present

## 2014-10-15 DIAGNOSIS — N183 Chronic kidney disease, stage 3 (moderate): Secondary | ICD-10-CM | POA: Diagnosis not present

## 2014-10-15 DIAGNOSIS — Z79899 Other long term (current) drug therapy: Secondary | ICD-10-CM | POA: Diagnosis not present

## 2014-10-21 DIAGNOSIS — I10 Essential (primary) hypertension: Secondary | ICD-10-CM | POA: Diagnosis not present

## 2014-10-21 DIAGNOSIS — R809 Proteinuria, unspecified: Secondary | ICD-10-CM | POA: Diagnosis not present

## 2014-10-21 DIAGNOSIS — E1129 Type 2 diabetes mellitus with other diabetic kidney complication: Secondary | ICD-10-CM | POA: Diagnosis not present

## 2014-10-21 DIAGNOSIS — N183 Chronic kidney disease, stage 3 (moderate): Secondary | ICD-10-CM | POA: Diagnosis not present

## 2014-10-21 DIAGNOSIS — D649 Anemia, unspecified: Secondary | ICD-10-CM | POA: Diagnosis not present

## 2014-11-19 DIAGNOSIS — I10 Essential (primary) hypertension: Secondary | ICD-10-CM | POA: Diagnosis not present

## 2014-11-19 DIAGNOSIS — E039 Hypothyroidism, unspecified: Secondary | ICD-10-CM | POA: Diagnosis not present

## 2014-11-19 DIAGNOSIS — Z23 Encounter for immunization: Secondary | ICD-10-CM | POA: Diagnosis not present

## 2014-11-19 DIAGNOSIS — E119 Type 2 diabetes mellitus without complications: Secondary | ICD-10-CM | POA: Diagnosis not present

## 2014-11-20 DIAGNOSIS — N184 Chronic kidney disease, stage 4 (severe): Secondary | ICD-10-CM | POA: Diagnosis not present

## 2014-11-21 DIAGNOSIS — H2511 Age-related nuclear cataract, right eye: Secondary | ICD-10-CM | POA: Diagnosis not present

## 2014-11-21 DIAGNOSIS — H35373 Puckering of macula, bilateral: Secondary | ICD-10-CM | POA: Diagnosis not present

## 2014-11-21 DIAGNOSIS — Z794 Long term (current) use of insulin: Secondary | ICD-10-CM | POA: Diagnosis not present

## 2014-11-21 DIAGNOSIS — E119 Type 2 diabetes mellitus without complications: Secondary | ICD-10-CM | POA: Diagnosis not present

## 2014-12-04 DIAGNOSIS — N184 Chronic kidney disease, stage 4 (severe): Secondary | ICD-10-CM | POA: Diagnosis not present

## 2014-12-18 DIAGNOSIS — N184 Chronic kidney disease, stage 4 (severe): Secondary | ICD-10-CM | POA: Diagnosis not present

## 2014-12-18 DIAGNOSIS — D509 Iron deficiency anemia, unspecified: Secondary | ICD-10-CM | POA: Diagnosis not present

## 2014-12-31 DIAGNOSIS — E039 Hypothyroidism, unspecified: Secondary | ICD-10-CM | POA: Diagnosis not present

## 2014-12-31 DIAGNOSIS — I10 Essential (primary) hypertension: Secondary | ICD-10-CM | POA: Diagnosis not present

## 2014-12-31 DIAGNOSIS — E119 Type 2 diabetes mellitus without complications: Secondary | ICD-10-CM | POA: Diagnosis not present

## 2014-12-31 DIAGNOSIS — D649 Anemia, unspecified: Secondary | ICD-10-CM | POA: Diagnosis not present

## 2015-01-01 DIAGNOSIS — N184 Chronic kidney disease, stage 4 (severe): Secondary | ICD-10-CM | POA: Diagnosis not present

## 2015-01-01 DIAGNOSIS — D509 Iron deficiency anemia, unspecified: Secondary | ICD-10-CM | POA: Diagnosis not present

## 2015-01-15 DIAGNOSIS — Z79899 Other long term (current) drug therapy: Secondary | ICD-10-CM | POA: Diagnosis not present

## 2015-01-15 DIAGNOSIS — D509 Iron deficiency anemia, unspecified: Secondary | ICD-10-CM | POA: Diagnosis not present

## 2015-01-15 DIAGNOSIS — N184 Chronic kidney disease, stage 4 (severe): Secondary | ICD-10-CM | POA: Diagnosis not present

## 2015-01-15 DIAGNOSIS — R809 Proteinuria, unspecified: Secondary | ICD-10-CM | POA: Diagnosis not present

## 2015-01-15 DIAGNOSIS — E559 Vitamin D deficiency, unspecified: Secondary | ICD-10-CM | POA: Diagnosis not present

## 2015-01-29 DIAGNOSIS — Z79899 Other long term (current) drug therapy: Secondary | ICD-10-CM | POA: Diagnosis not present

## 2015-01-29 DIAGNOSIS — R809 Proteinuria, unspecified: Secondary | ICD-10-CM | POA: Diagnosis not present

## 2015-01-29 DIAGNOSIS — D509 Iron deficiency anemia, unspecified: Secondary | ICD-10-CM | POA: Diagnosis not present

## 2015-01-29 DIAGNOSIS — E559 Vitamin D deficiency, unspecified: Secondary | ICD-10-CM | POA: Diagnosis not present

## 2015-01-29 DIAGNOSIS — N184 Chronic kidney disease, stage 4 (severe): Secondary | ICD-10-CM | POA: Diagnosis not present

## 2015-02-19 DIAGNOSIS — D509 Iron deficiency anemia, unspecified: Secondary | ICD-10-CM | POA: Diagnosis not present

## 2015-02-19 DIAGNOSIS — N184 Chronic kidney disease, stage 4 (severe): Secondary | ICD-10-CM | POA: Diagnosis not present

## 2015-03-05 DIAGNOSIS — D509 Iron deficiency anemia, unspecified: Secondary | ICD-10-CM | POA: Diagnosis not present

## 2015-03-05 DIAGNOSIS — N184 Chronic kidney disease, stage 4 (severe): Secondary | ICD-10-CM | POA: Diagnosis not present

## 2015-03-19 DIAGNOSIS — Z79899 Other long term (current) drug therapy: Secondary | ICD-10-CM | POA: Diagnosis not present

## 2015-03-19 DIAGNOSIS — E559 Vitamin D deficiency, unspecified: Secondary | ICD-10-CM | POA: Diagnosis not present

## 2015-03-19 DIAGNOSIS — R809 Proteinuria, unspecified: Secondary | ICD-10-CM | POA: Diagnosis not present

## 2015-03-19 DIAGNOSIS — N184 Chronic kidney disease, stage 4 (severe): Secondary | ICD-10-CM | POA: Diagnosis not present

## 2015-04-02 DIAGNOSIS — Z79899 Other long term (current) drug therapy: Secondary | ICD-10-CM | POA: Diagnosis not present

## 2015-04-02 DIAGNOSIS — E559 Vitamin D deficiency, unspecified: Secondary | ICD-10-CM | POA: Diagnosis not present

## 2015-04-02 DIAGNOSIS — N184 Chronic kidney disease, stage 4 (severe): Secondary | ICD-10-CM | POA: Diagnosis not present

## 2015-04-02 DIAGNOSIS — R809 Proteinuria, unspecified: Secondary | ICD-10-CM | POA: Diagnosis not present

## 2015-04-06 DIAGNOSIS — N401 Enlarged prostate with lower urinary tract symptoms: Secondary | ICD-10-CM | POA: Diagnosis not present

## 2015-04-06 DIAGNOSIS — N138 Other obstructive and reflux uropathy: Secondary | ICD-10-CM | POA: Diagnosis not present

## 2015-04-07 DIAGNOSIS — D649 Anemia, unspecified: Secondary | ICD-10-CM | POA: Diagnosis not present

## 2015-04-07 DIAGNOSIS — N184 Chronic kidney disease, stage 4 (severe): Secondary | ICD-10-CM | POA: Diagnosis not present

## 2015-04-07 DIAGNOSIS — E1129 Type 2 diabetes mellitus with other diabetic kidney complication: Secondary | ICD-10-CM | POA: Diagnosis not present

## 2015-04-07 DIAGNOSIS — R809 Proteinuria, unspecified: Secondary | ICD-10-CM | POA: Diagnosis not present

## 2015-04-07 DIAGNOSIS — I10 Essential (primary) hypertension: Secondary | ICD-10-CM | POA: Diagnosis not present

## 2015-04-08 DIAGNOSIS — E119 Type 2 diabetes mellitus without complications: Secondary | ICD-10-CM | POA: Diagnosis not present

## 2015-04-08 DIAGNOSIS — M10012 Idiopathic gout, left shoulder: Secondary | ICD-10-CM | POA: Diagnosis not present

## 2015-04-08 DIAGNOSIS — D649 Anemia, unspecified: Secondary | ICD-10-CM | POA: Diagnosis not present

## 2015-04-08 DIAGNOSIS — I10 Essential (primary) hypertension: Secondary | ICD-10-CM | POA: Diagnosis not present

## 2015-04-08 DIAGNOSIS — N189 Chronic kidney disease, unspecified: Secondary | ICD-10-CM | POA: Diagnosis not present

## 2015-04-08 DIAGNOSIS — E039 Hypothyroidism, unspecified: Secondary | ICD-10-CM | POA: Diagnosis not present

## 2015-04-08 DIAGNOSIS — Z125 Encounter for screening for malignant neoplasm of prostate: Secondary | ICD-10-CM | POA: Diagnosis not present

## 2015-04-08 DIAGNOSIS — E109 Type 1 diabetes mellitus without complications: Secondary | ICD-10-CM | POA: Diagnosis not present

## 2015-04-08 DIAGNOSIS — M87059 Idiopathic aseptic necrosis of unspecified femur: Secondary | ICD-10-CM | POA: Diagnosis not present

## 2015-06-11 DIAGNOSIS — N184 Chronic kidney disease, stage 4 (severe): Secondary | ICD-10-CM | POA: Diagnosis not present

## 2015-06-25 DIAGNOSIS — N184 Chronic kidney disease, stage 4 (severe): Secondary | ICD-10-CM | POA: Diagnosis not present

## 2015-07-09 DIAGNOSIS — N184 Chronic kidney disease, stage 4 (severe): Secondary | ICD-10-CM | POA: Diagnosis not present

## 2015-07-21 DIAGNOSIS — E039 Hypothyroidism, unspecified: Secondary | ICD-10-CM | POA: Diagnosis not present

## 2015-07-21 DIAGNOSIS — I10 Essential (primary) hypertension: Secondary | ICD-10-CM | POA: Diagnosis not present

## 2015-07-21 DIAGNOSIS — N189 Chronic kidney disease, unspecified: Secondary | ICD-10-CM | POA: Diagnosis not present

## 2015-07-21 DIAGNOSIS — E119 Type 2 diabetes mellitus without complications: Secondary | ICD-10-CM | POA: Diagnosis not present

## 2015-07-21 DIAGNOSIS — D649 Anemia, unspecified: Secondary | ICD-10-CM | POA: Diagnosis not present

## 2015-07-21 DIAGNOSIS — E1165 Type 2 diabetes mellitus with hyperglycemia: Secondary | ICD-10-CM | POA: Diagnosis not present

## 2015-07-23 DIAGNOSIS — N184 Chronic kidney disease, stage 4 (severe): Secondary | ICD-10-CM | POA: Diagnosis not present

## 2015-08-06 DIAGNOSIS — N184 Chronic kidney disease, stage 4 (severe): Secondary | ICD-10-CM | POA: Diagnosis not present

## 2015-08-20 DIAGNOSIS — Z79899 Other long term (current) drug therapy: Secondary | ICD-10-CM | POA: Diagnosis not present

## 2015-08-20 DIAGNOSIS — E559 Vitamin D deficiency, unspecified: Secondary | ICD-10-CM | POA: Diagnosis not present

## 2015-08-20 DIAGNOSIS — N184 Chronic kidney disease, stage 4 (severe): Secondary | ICD-10-CM | POA: Diagnosis not present

## 2015-08-20 DIAGNOSIS — R809 Proteinuria, unspecified: Secondary | ICD-10-CM | POA: Diagnosis not present

## 2015-08-31 DIAGNOSIS — M47816 Spondylosis without myelopathy or radiculopathy, lumbar region: Secondary | ICD-10-CM | POA: Diagnosis not present

## 2015-09-01 DIAGNOSIS — I10 Essential (primary) hypertension: Secondary | ICD-10-CM | POA: Diagnosis not present

## 2015-09-01 DIAGNOSIS — E1129 Type 2 diabetes mellitus with other diabetic kidney complication: Secondary | ICD-10-CM | POA: Diagnosis not present

## 2015-09-01 DIAGNOSIS — N184 Chronic kidney disease, stage 4 (severe): Secondary | ICD-10-CM | POA: Diagnosis not present

## 2015-09-01 DIAGNOSIS — D638 Anemia in other chronic diseases classified elsewhere: Secondary | ICD-10-CM | POA: Diagnosis not present

## 2015-09-03 DIAGNOSIS — Z79899 Other long term (current) drug therapy: Secondary | ICD-10-CM | POA: Diagnosis not present

## 2015-09-03 DIAGNOSIS — E559 Vitamin D deficiency, unspecified: Secondary | ICD-10-CM | POA: Diagnosis not present

## 2015-09-03 DIAGNOSIS — R809 Proteinuria, unspecified: Secondary | ICD-10-CM | POA: Diagnosis not present

## 2015-09-03 DIAGNOSIS — N184 Chronic kidney disease, stage 4 (severe): Secondary | ICD-10-CM | POA: Diagnosis not present

## 2015-09-17 DIAGNOSIS — N184 Chronic kidney disease, stage 4 (severe): Secondary | ICD-10-CM | POA: Diagnosis not present

## 2015-09-17 DIAGNOSIS — D509 Iron deficiency anemia, unspecified: Secondary | ICD-10-CM | POA: Diagnosis not present

## 2015-10-01 DIAGNOSIS — N184 Chronic kidney disease, stage 4 (severe): Secondary | ICD-10-CM | POA: Diagnosis not present

## 2015-10-15 DIAGNOSIS — N184 Chronic kidney disease, stage 4 (severe): Secondary | ICD-10-CM | POA: Diagnosis not present

## 2015-10-15 DIAGNOSIS — D509 Iron deficiency anemia, unspecified: Secondary | ICD-10-CM | POA: Diagnosis not present

## 2015-10-29 DIAGNOSIS — Z79899 Other long term (current) drug therapy: Secondary | ICD-10-CM | POA: Diagnosis not present

## 2015-10-29 DIAGNOSIS — E559 Vitamin D deficiency, unspecified: Secondary | ICD-10-CM | POA: Diagnosis not present

## 2015-10-29 DIAGNOSIS — R809 Proteinuria, unspecified: Secondary | ICD-10-CM | POA: Diagnosis not present

## 2015-10-29 DIAGNOSIS — N184 Chronic kidney disease, stage 4 (severe): Secondary | ICD-10-CM | POA: Diagnosis not present

## 2015-11-12 DIAGNOSIS — Z79899 Other long term (current) drug therapy: Secondary | ICD-10-CM | POA: Diagnosis not present

## 2015-11-12 DIAGNOSIS — D509 Iron deficiency anemia, unspecified: Secondary | ICD-10-CM | POA: Diagnosis not present

## 2015-11-12 DIAGNOSIS — E559 Vitamin D deficiency, unspecified: Secondary | ICD-10-CM | POA: Diagnosis not present

## 2015-11-12 DIAGNOSIS — R809 Proteinuria, unspecified: Secondary | ICD-10-CM | POA: Diagnosis not present

## 2015-11-12 DIAGNOSIS — N184 Chronic kidney disease, stage 4 (severe): Secondary | ICD-10-CM | POA: Diagnosis not present

## 2015-11-17 DIAGNOSIS — D638 Anemia in other chronic diseases classified elsewhere: Secondary | ICD-10-CM | POA: Diagnosis not present

## 2015-11-17 DIAGNOSIS — N184 Chronic kidney disease, stage 4 (severe): Secondary | ICD-10-CM | POA: Diagnosis not present

## 2015-11-17 DIAGNOSIS — I509 Heart failure, unspecified: Secondary | ICD-10-CM | POA: Diagnosis not present

## 2015-11-17 DIAGNOSIS — D509 Iron deficiency anemia, unspecified: Secondary | ICD-10-CM | POA: Diagnosis not present

## 2015-11-17 DIAGNOSIS — I10 Essential (primary) hypertension: Secondary | ICD-10-CM | POA: Diagnosis not present

## 2015-11-26 DIAGNOSIS — E039 Hypothyroidism, unspecified: Secondary | ICD-10-CM | POA: Diagnosis not present

## 2015-11-26 DIAGNOSIS — D649 Anemia, unspecified: Secondary | ICD-10-CM | POA: Diagnosis not present

## 2015-11-26 DIAGNOSIS — E1165 Type 2 diabetes mellitus with hyperglycemia: Secondary | ICD-10-CM | POA: Diagnosis not present

## 2015-11-26 DIAGNOSIS — I10 Essential (primary) hypertension: Secondary | ICD-10-CM | POA: Diagnosis not present

## 2015-12-03 DIAGNOSIS — N184 Chronic kidney disease, stage 4 (severe): Secondary | ICD-10-CM | POA: Diagnosis not present

## 2015-12-03 DIAGNOSIS — D509 Iron deficiency anemia, unspecified: Secondary | ICD-10-CM | POA: Diagnosis not present

## 2015-12-24 DIAGNOSIS — R809 Proteinuria, unspecified: Secondary | ICD-10-CM | POA: Diagnosis not present

## 2015-12-24 DIAGNOSIS — Z79899 Other long term (current) drug therapy: Secondary | ICD-10-CM | POA: Diagnosis not present

## 2015-12-24 DIAGNOSIS — E559 Vitamin D deficiency, unspecified: Secondary | ICD-10-CM | POA: Diagnosis not present

## 2015-12-24 DIAGNOSIS — N184 Chronic kidney disease, stage 4 (severe): Secondary | ICD-10-CM | POA: Diagnosis not present

## 2016-01-14 DIAGNOSIS — Z79899 Other long term (current) drug therapy: Secondary | ICD-10-CM | POA: Diagnosis not present

## 2016-01-14 DIAGNOSIS — N184 Chronic kidney disease, stage 4 (severe): Secondary | ICD-10-CM | POA: Diagnosis not present

## 2016-01-14 DIAGNOSIS — E559 Vitamin D deficiency, unspecified: Secondary | ICD-10-CM | POA: Diagnosis not present

## 2016-01-14 DIAGNOSIS — R809 Proteinuria, unspecified: Secondary | ICD-10-CM | POA: Diagnosis not present

## 2016-02-02 DIAGNOSIS — N184 Chronic kidney disease, stage 4 (severe): Secondary | ICD-10-CM | POA: Diagnosis not present

## 2016-02-02 DIAGNOSIS — M109 Gout, unspecified: Secondary | ICD-10-CM | POA: Diagnosis not present

## 2016-02-02 DIAGNOSIS — I10 Essential (primary) hypertension: Secondary | ICD-10-CM | POA: Diagnosis not present

## 2016-02-02 DIAGNOSIS — E1129 Type 2 diabetes mellitus with other diabetic kidney complication: Secondary | ICD-10-CM | POA: Diagnosis not present

## 2016-02-04 DIAGNOSIS — N184 Chronic kidney disease, stage 4 (severe): Secondary | ICD-10-CM | POA: Diagnosis not present

## 2016-07-25 ENCOUNTER — Other Ambulatory Visit: Payer: Self-pay

## 2016-07-25 ENCOUNTER — Observation Stay (HOSPITAL_COMMUNITY): Payer: Medicare Other

## 2016-07-25 ENCOUNTER — Inpatient Hospital Stay (HOSPITAL_COMMUNITY)
Admission: AD | Admit: 2016-07-25 | Discharge: 2016-08-17 | DRG: 233 | Disposition: A | Discharge status: Skilled Nursing Facility | Payer: Medicare Other | Source: Other Acute Inpatient Hospital | Attending: Internal Medicine | Admitting: Internal Medicine

## 2016-07-25 ENCOUNTER — Encounter (HOSPITAL_COMMUNITY): Payer: Self-pay | Admitting: Internal Medicine

## 2016-07-25 ENCOUNTER — Observation Stay (HOSPITAL_BASED_OUTPATIENT_CLINIC_OR_DEPARTMENT_OTHER): Payer: Medicare Other

## 2016-07-25 DIAGNOSIS — N138 Other obstructive and reflux uropathy: Secondary | ICD-10-CM | POA: Diagnosis not present

## 2016-07-25 DIAGNOSIS — N170 Acute kidney failure with tubular necrosis: Secondary | ICD-10-CM | POA: Diagnosis not present

## 2016-07-25 DIAGNOSIS — J9601 Acute respiratory failure with hypoxia: Secondary | ICD-10-CM

## 2016-07-25 DIAGNOSIS — E1122 Type 2 diabetes mellitus with diabetic chronic kidney disease: Secondary | ICD-10-CM | POA: Diagnosis present

## 2016-07-25 DIAGNOSIS — E119 Type 2 diabetes mellitus without complications: Secondary | ICD-10-CM

## 2016-07-25 DIAGNOSIS — R338 Other retention of urine: Secondary | ICD-10-CM | POA: Diagnosis not present

## 2016-07-25 DIAGNOSIS — Z7951 Long term (current) use of inhaled steroids: Secondary | ICD-10-CM

## 2016-07-25 DIAGNOSIS — I472 Ventricular tachycardia: Secondary | ICD-10-CM | POA: Diagnosis not present

## 2016-07-25 DIAGNOSIS — E039 Hypothyroidism, unspecified: Secondary | ICD-10-CM

## 2016-07-25 DIAGNOSIS — I2510 Atherosclerotic heart disease of native coronary artery without angina pectoris: Secondary | ICD-10-CM | POA: Diagnosis not present

## 2016-07-25 DIAGNOSIS — I360 Nonrheumatic tricuspid (valve) stenosis: Secondary | ICD-10-CM | POA: Diagnosis not present

## 2016-07-25 DIAGNOSIS — Z09 Encounter for follow-up examination after completed treatment for conditions other than malignant neoplasm: Secondary | ICD-10-CM

## 2016-07-25 DIAGNOSIS — Z9689 Presence of other specified functional implants: Secondary | ICD-10-CM

## 2016-07-25 DIAGNOSIS — I6523 Occlusion and stenosis of bilateral carotid arteries: Secondary | ICD-10-CM | POA: Diagnosis present

## 2016-07-25 DIAGNOSIS — Z79899 Other long term (current) drug therapy: Secondary | ICD-10-CM

## 2016-07-25 DIAGNOSIS — I214 Non-ST elevation (NSTEMI) myocardial infarction: Secondary | ICD-10-CM

## 2016-07-25 DIAGNOSIS — I5043 Acute on chronic combined systolic (congestive) and diastolic (congestive) heart failure: Secondary | ICD-10-CM

## 2016-07-25 DIAGNOSIS — E1022 Type 1 diabetes mellitus with diabetic chronic kidney disease: Secondary | ICD-10-CM | POA: Diagnosis not present

## 2016-07-25 DIAGNOSIS — E1151 Type 2 diabetes mellitus with diabetic peripheral angiopathy without gangrene: Secondary | ICD-10-CM | POA: Diagnosis not present

## 2016-07-25 DIAGNOSIS — R001 Bradycardia, unspecified: Secondary | ICD-10-CM | POA: Diagnosis not present

## 2016-07-25 DIAGNOSIS — R0902 Hypoxemia: Secondary | ICD-10-CM

## 2016-07-25 DIAGNOSIS — D62 Acute posthemorrhagic anemia: Secondary | ICD-10-CM | POA: Diagnosis not present

## 2016-07-25 DIAGNOSIS — Z01818 Encounter for other preprocedural examination: Secondary | ICD-10-CM

## 2016-07-25 DIAGNOSIS — N184 Chronic kidney disease, stage 4 (severe): Secondary | ICD-10-CM | POA: Diagnosis not present

## 2016-07-25 DIAGNOSIS — E876 Hypokalemia: Secondary | ICD-10-CM | POA: Diagnosis not present

## 2016-07-25 DIAGNOSIS — I10 Essential (primary) hypertension: Secondary | ICD-10-CM

## 2016-07-25 DIAGNOSIS — I255 Ischemic cardiomyopathy: Secondary | ICD-10-CM | POA: Diagnosis not present

## 2016-07-25 DIAGNOSIS — R0600 Dyspnea, unspecified: Secondary | ICD-10-CM

## 2016-07-25 DIAGNOSIS — J9690 Respiratory failure, unspecified, unspecified whether with hypoxia or hypercapnia: Secondary | ICD-10-CM

## 2016-07-25 DIAGNOSIS — Z87891 Personal history of nicotine dependence: Secondary | ICD-10-CM

## 2016-07-25 DIAGNOSIS — R079 Chest pain, unspecified: Secondary | ICD-10-CM | POA: Diagnosis present

## 2016-07-25 DIAGNOSIS — I5042 Chronic combined systolic (congestive) and diastolic (congestive) heart failure: Secondary | ICD-10-CM

## 2016-07-25 DIAGNOSIS — Z794 Long term (current) use of insulin: Secondary | ICD-10-CM | POA: Diagnosis not present

## 2016-07-25 DIAGNOSIS — I252 Old myocardial infarction: Secondary | ICD-10-CM

## 2016-07-25 DIAGNOSIS — E785 Hyperlipidemia, unspecified: Secondary | ICD-10-CM | POA: Diagnosis not present

## 2016-07-25 DIAGNOSIS — R748 Abnormal levels of other serum enzymes: Secondary | ICD-10-CM | POA: Diagnosis not present

## 2016-07-25 DIAGNOSIS — N179 Acute kidney failure, unspecified: Secondary | ICD-10-CM | POA: Diagnosis not present

## 2016-07-25 DIAGNOSIS — K219 Gastro-esophageal reflux disease without esophagitis: Secondary | ICD-10-CM | POA: Diagnosis not present

## 2016-07-25 DIAGNOSIS — N401 Enlarged prostate with lower urinary tract symptoms: Secondary | ICD-10-CM

## 2016-07-25 DIAGNOSIS — E669 Obesity, unspecified: Secondary | ICD-10-CM | POA: Diagnosis present

## 2016-07-25 DIAGNOSIS — Z8249 Family history of ischemic heart disease and other diseases of the circulatory system: Secondary | ICD-10-CM

## 2016-07-25 DIAGNOSIS — R7989 Other specified abnormal findings of blood chemistry: Secondary | ICD-10-CM | POA: Diagnosis present

## 2016-07-25 DIAGNOSIS — D631 Anemia in chronic kidney disease: Secondary | ICD-10-CM | POA: Diagnosis present

## 2016-07-25 DIAGNOSIS — R5381 Other malaise: Secondary | ICD-10-CM | POA: Diagnosis not present

## 2016-07-25 DIAGNOSIS — R778 Other specified abnormalities of plasma proteins: Secondary | ICD-10-CM | POA: Diagnosis present

## 2016-07-25 DIAGNOSIS — Z951 Presence of aortocoronary bypass graft: Secondary | ICD-10-CM

## 2016-07-25 DIAGNOSIS — J810 Acute pulmonary edema: Secondary | ICD-10-CM

## 2016-07-25 DIAGNOSIS — I110 Hypertensive heart disease with heart failure: Secondary | ICD-10-CM

## 2016-07-25 DIAGNOSIS — I130 Hypertensive heart and chronic kidney disease with heart failure and stage 1 through stage 4 chronic kidney disease, or unspecified chronic kidney disease: Secondary | ICD-10-CM | POA: Diagnosis not present

## 2016-07-25 DIAGNOSIS — Z6826 Body mass index (BMI) 26.0-26.9, adult: Secondary | ICD-10-CM

## 2016-07-25 DIAGNOSIS — I509 Heart failure, unspecified: Secondary | ICD-10-CM

## 2016-07-25 DIAGNOSIS — N185 Chronic kidney disease, stage 5: Secondary | ICD-10-CM | POA: Diagnosis present

## 2016-07-25 HISTORY — DX: Chronic kidney disease, unspecified: N18.9

## 2016-07-25 HISTORY — DX: Occlusion and stenosis of unspecified carotid artery: I65.29

## 2016-07-25 HISTORY — DX: Essential (primary) hypertension: I10

## 2016-07-25 HISTORY — DX: Type 2 diabetes mellitus without complications: E11.9

## 2016-07-25 HISTORY — DX: Atherosclerotic heart disease of native coronary artery without angina pectoris: I25.10

## 2016-07-25 HISTORY — DX: Non-ST elevation (NSTEMI) myocardial infarction: I21.4

## 2016-07-25 LAB — TROPONIN I
TROPONIN I: 6.14 ng/mL — AB (ref <0.03)
TROPONIN I: 10.36 ng/mL — AB (ref <0.03)

## 2016-07-25 LAB — HEPARIN LEVEL (UNFRACTIONATED): HEPARIN UNFRACTIONATED: 0.2 [IU]/mL — AB (ref 0.30–0.70)

## 2016-07-25 LAB — ECHOCARDIOGRAM COMPLETE
HEIGHTINCHES: 73 in
WEIGHTICAEL: 3412.8 [oz_av]

## 2016-07-25 LAB — TSH: TSH: 0.565 u[IU]/mL (ref 0.350–4.500)

## 2016-07-25 LAB — GLUCOSE, CAPILLARY
GLUCOSE-CAPILLARY: 274 mg/dL — AB (ref 65–99)
GLUCOSE-CAPILLARY: 292 mg/dL — AB (ref 65–99)
Glucose-Capillary: 173 mg/dL — ABNORMAL HIGH (ref 65–99)

## 2016-07-25 MED ORDER — VITAMIN D 1000 UNITS PO TABS
2000.0000 [IU] | ORAL_TABLET | Freq: Every day | ORAL | Status: DC
  Administered 2016-07-26 – 2016-08-04 (×10): 2000 [IU] via ORAL
  Filled 2016-07-25 (×10): qty 2

## 2016-07-25 MED ORDER — ONDANSETRON HCL 4 MG/2ML IJ SOLN
4.0000 mg | Freq: Four times a day (QID) | INTRAMUSCULAR | Status: DC | PRN
  Administered 2016-07-25: 4 mg via INTRAVENOUS
  Filled 2016-07-25: qty 2

## 2016-07-25 MED ORDER — LIDOCAINE HCL 2 % EX GEL
1.0000 "application " | Freq: Once | CUTANEOUS | Status: AC
  Administered 2016-07-25: 1 via URETHRAL
  Filled 2016-07-25: qty 5

## 2016-07-25 MED ORDER — ORAL CARE MOUTH RINSE
15.0000 mL | Freq: Two times a day (BID) | OROMUCOSAL | Status: DC
  Administered 2016-07-26 – 2016-07-27 (×4): 15 mL via OROMUCOSAL

## 2016-07-25 MED ORDER — DOXAZOSIN MESYLATE 4 MG PO TABS
4.0000 mg | ORAL_TABLET | Freq: Two times a day (BID) | ORAL | Status: DC
  Administered 2016-07-26 – 2016-08-01 (×13): 4 mg via ORAL
  Filled 2016-07-25 (×15): qty 1

## 2016-07-25 MED ORDER — CHLORHEXIDINE GLUCONATE 0.12 % MT SOLN
15.0000 mL | Freq: Two times a day (BID) | OROMUCOSAL | Status: DC
  Administered 2016-07-25 – 2016-07-28 (×6): 15 mL via OROMUCOSAL
  Filled 2016-07-25 (×3): qty 15

## 2016-07-25 MED ORDER — LUBIPROSTONE 24 MCG PO CAPS
24.0000 ug | ORAL_CAPSULE | Freq: Two times a day (BID) | ORAL | Status: DC
  Administered 2016-07-26 – 2016-08-17 (×44): 24 ug via ORAL
  Filled 2016-07-25 (×48): qty 1

## 2016-07-25 MED ORDER — INSULIN ASPART 100 UNIT/ML ~~LOC~~ SOLN
0.0000 [IU] | Freq: Three times a day (TID) | SUBCUTANEOUS | Status: DC
  Administered 2016-07-25: 5 [IU] via SUBCUTANEOUS
  Administered 2016-07-26 (×3): 2 [IU] via SUBCUTANEOUS
  Administered 2016-07-27: 3 [IU] via SUBCUTANEOUS
  Administered 2016-07-27 (×2): 2 [IU] via SUBCUTANEOUS
  Administered 2016-07-28 (×2): 3 [IU] via SUBCUTANEOUS
  Administered 2016-07-28: 1 [IU] via SUBCUTANEOUS
  Administered 2016-07-29: 3 [IU] via SUBCUTANEOUS
  Administered 2016-07-29: 2 [IU] via SUBCUTANEOUS
  Administered 2016-07-29 – 2016-07-30 (×2): 3 [IU] via SUBCUTANEOUS
  Administered 2016-07-30 – 2016-07-31 (×3): 5 [IU] via SUBCUTANEOUS
  Administered 2016-07-31 (×2): 3 [IU] via SUBCUTANEOUS
  Administered 2016-08-01 (×3): 2 [IU] via SUBCUTANEOUS
  Administered 2016-08-02: 3 [IU] via SUBCUTANEOUS
  Administered 2016-08-02 – 2016-08-03 (×2): 2 [IU] via SUBCUTANEOUS

## 2016-07-25 MED ORDER — PERFLUTREN LIPID MICROSPHERE
1.0000 mL | INTRAVENOUS | Status: AC | PRN
  Administered 2016-07-25: 2 mL via INTRAVENOUS
  Filled 2016-07-25: qty 10

## 2016-07-25 MED ORDER — NITROGLYCERIN 0.4 MG SL SUBL
0.4000 mg | SUBLINGUAL_TABLET | SUBLINGUAL | Status: DC | PRN
  Administered 2016-08-04: 0.4 mg via SUBLINGUAL
  Filled 2016-07-25: qty 1

## 2016-07-25 MED ORDER — FUROSEMIDE 10 MG/ML IJ SOLN
40.0000 mg | Freq: Once | INTRAMUSCULAR | Status: AC
Start: 2016-07-25 — End: 2016-07-25
  Administered 2016-07-25: 40 mg via INTRAVENOUS
  Filled 2016-07-25: qty 4

## 2016-07-25 MED ORDER — INSULIN ASPART PROT & ASPART (70-30 MIX) 100 UNIT/ML ~~LOC~~ SUSP
5.0000 [IU] | Freq: Two times a day (BID) | SUBCUTANEOUS | Status: DC
  Administered 2016-07-26 – 2016-08-03 (×16): 5 [IU] via SUBCUTANEOUS
  Filled 2016-07-25 (×2): qty 10

## 2016-07-25 MED ORDER — SODIUM BICARBONATE 650 MG PO TABS
650.0000 mg | ORAL_TABLET | Freq: Two times a day (BID) | ORAL | Status: DC
  Administered 2016-07-26 – 2016-08-04 (×20): 650 mg via ORAL
  Filled 2016-07-25 (×20): qty 1

## 2016-07-25 MED ORDER — NITROGLYCERIN IN D5W 200-5 MCG/ML-% IV SOLN
0.0000 ug/min | INTRAVENOUS | Status: DC
  Administered 2016-07-25: 5 ug/min via INTRAVENOUS
  Administered 2016-07-26: 100 ug/min via INTRAVENOUS
  Administered 2016-07-26: 60 ug/min via INTRAVENOUS
  Administered 2016-07-27: 33.333 ug/min via INTRAVENOUS
  Administered 2016-07-28: 30 ug/min via INTRAVENOUS
  Administered 2016-07-29: 33.333 ug/min via INTRAVENOUS
  Filled 2016-07-25 (×6): qty 250

## 2016-07-25 MED ORDER — MORPHINE SULFATE (PF) 4 MG/ML IV SOLN
4.0000 mg | Freq: Once | INTRAVENOUS | Status: AC
  Administered 2016-07-25: 4 mg via INTRAVENOUS

## 2016-07-25 MED ORDER — NICARDIPINE HCL IN NACL 20-0.86 MG/200ML-% IV SOLN
3.0000 mg/h | INTRAVENOUS | Status: DC
  Filled 2016-07-25: qty 200

## 2016-07-25 MED ORDER — ISOSORB DINITRATE-HYDRALAZINE 20-37.5 MG PO TABS
1.0000 | ORAL_TABLET | Freq: Two times a day (BID) | ORAL | Status: DC
  Filled 2016-07-25 (×2): qty 1

## 2016-07-25 MED ORDER — PERFLUTREN LIPID MICROSPHERE
INTRAVENOUS | Status: AC
  Administered 2016-07-25: 20:00:00
  Filled 2016-07-25: qty 10

## 2016-07-25 MED ORDER — HEPARIN (PORCINE) IN NACL 100-0.45 UNIT/ML-% IJ SOLN
INTRAMUSCULAR | Status: AC
  Filled 2016-07-25: qty 250

## 2016-07-25 MED ORDER — CARVEDILOL 25 MG PO TABS
25.0000 mg | ORAL_TABLET | Freq: Two times a day (BID) | ORAL | Status: DC
  Administered 2016-07-26 – 2016-08-04 (×20): 25 mg via ORAL
  Filled 2016-07-25 (×21): qty 1

## 2016-07-25 MED ORDER — ACETAMINOPHEN 325 MG PO TABS
650.0000 mg | ORAL_TABLET | ORAL | Status: DC | PRN
  Filled 2016-07-25: qty 2

## 2016-07-25 MED ORDER — MORPHINE SULFATE (PF) 4 MG/ML IV SOLN
INTRAVENOUS | Status: AC
  Administered 2016-07-25: 4 mg via INTRAVENOUS
  Filled 2016-07-25: qty 1

## 2016-07-25 MED ORDER — FUROSEMIDE 10 MG/ML IJ SOLN
60.0000 mg | Freq: Two times a day (BID) | INTRAMUSCULAR | Status: DC
  Administered 2016-07-25: 60 mg via INTRAVENOUS
  Filled 2016-07-25: qty 6

## 2016-07-25 MED ORDER — ROSUVASTATIN CALCIUM 10 MG PO TABS
10.0000 mg | ORAL_TABLET | Freq: Every evening | ORAL | Status: DC
  Administered 2016-07-26 – 2016-08-15 (×20): 10 mg via ORAL
  Filled 2016-07-25 (×22): qty 1

## 2016-07-25 MED ORDER — HEPARIN (PORCINE) IN NACL 100-0.45 UNIT/ML-% IJ SOLN
1600.0000 [IU]/h | INTRAMUSCULAR | Status: DC
  Administered 2016-07-26 – 2016-07-27 (×3): 1600 [IU]/h via INTRAVENOUS
  Filled 2016-07-25 (×4): qty 250

## 2016-07-25 MED ORDER — LEVOTHYROXINE SODIUM 100 MCG PO TABS
100.0000 ug | ORAL_TABLET | Freq: Every day | ORAL | Status: DC
  Administered 2016-07-26 – 2016-08-17 (×22): 100 ug via ORAL
  Filled 2016-07-25 (×23): qty 1

## 2016-07-25 MED ORDER — AMLODIPINE BESYLATE 10 MG PO TABS
10.0000 mg | ORAL_TABLET | Freq: Every day | ORAL | Status: DC
  Administered 2016-07-26 – 2016-08-04 (×10): 10 mg via ORAL
  Filled 2016-07-25 (×10): qty 1

## 2016-07-25 MED ORDER — ALLOPURINOL 100 MG PO TABS
100.0000 mg | ORAL_TABLET | Freq: Every day | ORAL | Status: DC
  Administered 2016-07-26 – 2016-08-17 (×22): 100 mg via ORAL
  Filled 2016-07-25 (×22): qty 1

## 2016-07-25 MED ORDER — TAMSULOSIN HCL 0.4 MG PO CAPS
0.8000 mg | ORAL_CAPSULE | Freq: Every evening | ORAL | Status: DC
  Administered 2016-07-26 – 2016-08-06 (×11): 0.8 mg via ORAL
  Filled 2016-07-25 (×13): qty 2

## 2016-07-25 MED ORDER — ASPIRIN EC 81 MG PO TBEC
81.0000 mg | DELAYED_RELEASE_TABLET | Freq: Every day | ORAL | Status: DC
  Administered 2016-07-26 – 2016-07-28 (×3): 81 mg via ORAL
  Filled 2016-07-25 (×3): qty 1

## 2016-07-25 MED ORDER — FUROSEMIDE 10 MG/ML IJ SOLN: 60.0000 mg | Freq: Two times a day (BID) | INTRAMUSCULAR | Status: DC

## 2016-07-25 MED ORDER — MORPHINE SULFATE (PF) 4 MG/ML IV SOLN
2.0000 mg | Freq: Once | INTRAVENOUS | Status: AC
  Administered 2016-07-25: 2 mg via INTRAVENOUS
  Filled 2016-07-25: qty 1

## 2016-07-25 MED ORDER — FUROSEMIDE 10 MG/ML IJ SOLN
80.0000 mg | Freq: Two times a day (BID) | INTRAMUSCULAR | Status: DC
  Administered 2016-07-26 – 2016-07-31 (×11): 80 mg via INTRAVENOUS
  Filled 2016-07-25 (×13): qty 8

## 2016-07-25 MED ORDER — HYDRALAZINE HCL 20 MG/ML IJ SOLN
10.0000 mg | INTRAMUSCULAR | Status: DC | PRN
  Administered 2016-07-25: 20 mg via INTRAVENOUS
  Filled 2016-07-25 (×2): qty 1

## 2016-07-25 NOTE — Progress Notes (Signed)
Pt BP 175/81 and unable to come off bipap to take oral blood pressure meds. Paged on call PA for IV nitro gtt. Orders received. Will start pt on nitro gtt for BP support. Will continue to monitor.   Albertina Senegal E

## 2016-07-25 NOTE — Progress Notes (Signed)
PCCM Interval Note  S:  Called to come back to evaluate [name] soon after his initial eval earlier this afternoon. He became more dyspneic, tachypneic just as his transfer was being carried out.   O: Vitals:   07/25/16 1717 07/25/16 1720 07/25/16 1727 07/25/16 1737  BP: (!) 212/102 (!) 208/92 (!) 213/98 (!) 194/90  Pulse: (!) 102 (!) 103 (!) 102 (!) 109  Resp: (!) 32 (!) 41 (!) 31 (!) 44  Temp:      TempSrc:      SpO2: 97% 97% 97% 97%  Weight:      Height:       Gen: Obese man on BiPAP, appears uncomfortable  ENT: BiPAP in place, unable to assess OP  Neck: large neck, unable to assess JVP  Lungs: tachypnea with good volumes, high Ve, bilateral crackles on insp  Cardiovascular: regular, tachy to 107  Abdomen: benign  Musculoskeletal: No deformities, no cyanosis or clubbing  Neuro: awake and appropriate   Impression:  Acute resp failure in setting CHF exacerbation, chronic renal dysfxn.   Plans:  - increase NTG to better control HTN, goal to the 170's - add a second agent, nicardipine if unable to achieve control w the NTG - increased ePAP to 10; continue mandatory biPAP  - morphine 4mg  x 1 - follow trop and ECG - additional dose lasix 40mg  now (received 60mg  before moving) - I explained to him and to family that he is at risk for resp failure but that I believe we are dealing w a reversible cause, that MV would be warranted should he fatigue. Understandably he would like to avoid, but he is willing to be intubated if needed.   Independent CC time 45 minutes   Baltazar Apo, MD, PhD 07/25/2016, 5:56 PM [facility] [phone] or if no answer [phone]

## 2016-07-25 NOTE — Progress Notes (Signed)
ANTICOAGULATION CONSULT NOTE - Initial Consult  Pharmacy Consult for Heparin Indication: chest pain/ACS  No Known Allergies  Patient Measurements: Height: 6\' 1"  (185.4 cm) Weight: 213 lb 4.8 oz (96.8 kg) IBW/kg (Calculated) : 79.9 Heparin Dosing Weight:  96.8 kg  Vital Signs: Temp: 97.3 F (36.3 C) (06/11 1109) Temp Source: Oral (06/11 1109) BP: 173/82 (06/11 1109) Pulse Rate: 77 (06/11 1109)  Labs: No results for input(s): HGB, HCT, PLT, APTT, LABPROT, INR, HEPARINUNFRC, HEPRLOWMOCWT, CREATININE, CKTOTAL, CKMB, TROPONINI in the last 72 hours.  CrCl cannot be calculated (Patient's most recent lab result is older than the maximum 21 days allowed.).   Medical History: Past Medical History:  Diagnosis Date  . Carotid artery occlusion   . Chronic kidney disease   . Diabetes mellitus without complication ([place])   . Hypertension     Assessment: CC/HPI: Transferred from OSH with respiratory distress, CP. Suspect B PNA, aortic atheroscloerosis, NSTEMI, acute respiratory failure, CHF, CKD,   WBC 8.5, H/H 9.1/27.5, Plts 140 PT/INR 10.7/1.1 Ddimer 4.75 elevated Glucose 261, BUN 60, Scr 3.83, Albumin .34, K=3.8,  ProBNP 19808  PMH: DM, HTN, THR bilaterally, CHF, HTN, HLD, DM, BPH, CKD, prior herniated disk in 2005 sp L3/L4 diskectomy and fusion, mild bilateral ICA stenosis in 2014 and EF 60-65% with moderate LVH by echo in 2014, carotid artery occlusion  Anticoag: Elevated troponin. Transferred to [facility] already on IV heparin   Goal of Therapy:  Heparin level 0.3-0.7 units/ml Monitor platelets by anticoagulation protocol: Yes   Plan:  IV heparin started at OSH 4000 unit bolus ( at 0750) and 1000 units/hr. Increase IV heparin to 1350 units/hr Check HL in 6 hrs after rate increased   Alfonsa Vaile S. Alford Highland, PharmD, BCPS Clinical Staff Pharmacist Pager [phone]  Eilene Ghazi Stillinger 07/25/2016,1:34 PM

## 2016-07-25 NOTE — Progress Notes (Signed)
  Echocardiogram 2D Echocardiogram has been performed.  [name] 07/25/2016, 7:53 PM

## 2016-07-25 NOTE — Progress Notes (Signed)
Appreciate PCCM evaluation - respiratory status has declined. Additional lasix has been given. Will obtain a STAT echo which I will read tonight. Agree with additional BP control - try IV hydralazine - may need cardene . If this is acute decompensated CHF, would avoid b-blockers. Troponin has risen higher, more suggestive of acute coronary syndrome.   Pixie Casino, MD, [place]  Attending Cardiologist  Direct Dial: [phone]  Fax: [phone]  Website:  [email or web address]

## 2016-07-25 NOTE — Progress Notes (Signed)
Patient transported to 2H22.

## 2016-07-25 NOTE — H&P (Signed)
ADMISSION HISTORY & PHYSICAL  Patient Name: [name] Date of Encounter: 07/25/2016 Primary Care Physician: Foye Spurling, MD Cardiologist: None [facility])  Hospital Problem List   Principal Problem:   Acute on chronic combined systolic and diastolic CHF (congestive heart failure) ([place]) Active Problems:   Essential hypertension   BPH with obstruction/lower urinary tract symptoms   AKI (acute kidney injury) ([place])   CKD (chronic kidney disease) stage 4, GFR 15-29 ml/min (HCC)   Elevated troponin   Hypothyroidism   Dyslipidemia   Diabetes mellitus, insulin-dependent (IDDM or type I) ([place])    Chief Complaint    "It's hard to breathe"  HPI   This is a 71 y.o. male with a past medical history significant for hypertension, IDDM, BPH, prior herniated disk in 2005 sp L3/L4 diskectomy and fusion, mild bilateral ICA stenosis in 2014 and EF 60-65% with moderate LVH by echo in 2014, there was focal distal anterior and anteroseptal thinning and akinesis, diastolic dysfunction and mild to moderate pulmonary hypertension at the time. He also has CKD with creatinine of 2.65 in 2015 (now 3.83) -sees Dr. Hinda Lenis in [place]. He now presented today to [facility] with complaints of chest pain since last Saturday (started in the abdomen and worked up to the chest) and progressive dyspnea, ultimately brought in by EMS. He has required BIPAP and was weaned off prior to transport, but then restarted when he arrived here for high O2 requirement. Labs significant for Creatinine of 3.83 (GFR 19), Troponin T of 0.26, BNP of 19,808, anemia with H/H 9.1/27.5, glucose of 261, no leukocytosis. CXR shows mild cardiomegaly, CHF/consolidation - suspect pneumonia with superimposed pulmonary edema/ARDS, EKG shows NSR at 76 (personally reviewed) with anterior and lateral ST depression. He received IV lasix but has not voided much - he reports some difficulty in urinating, but similar to baseline. Bladder scan here  shows 700 cc and foley will be placed. He denies any chest pain.  PMHx   Past Medical History:  Diagnosis Date  . Carotid artery occlusion   . Chronic kidney disease   . Diabetes mellitus without complication ([place])   . Hypertension     Past Surgical History:  Procedure Laterality Date  . LUMBAR FUSION  2005    FAMHx   Family History  Problem Relation Age of Onset  . Heart disease Mother   . Hypertension Mother   . Hypertension Father     SOCHx    reports that he has quit smoking. He has never used smokeless tobacco. He reports that he does not drink alcohol or use drugs.  Outpatient Medications   No current facility-administered medications on file prior to encounter.    No current outpatient prescriptions on file prior to encounter.    Inpatient Medications    Scheduled Meds:   Continuous Infusions: . heparin      PRN Meds:    ALLERGIES   Allergies not on file  ROS   Pertinent items noted in HPI and remainder of comprehensive ROS otherwise negative.  Vitals   Vitals:   07/25/16 1050 07/25/16 1109  BP:  (!) 173/82  Pulse: 81 77  Resp: (!) 22 (!) 27  Temp:  97.3 F (36.3 C)  TempSrc:  Oral  SpO2: 93% 95%   No intake or output data in the 24 hours ending 07/25/16 1146 There were no vitals filed for this visit.  Physical Exam   General appearance: alert, mild distress and on bipap Neck: JVD -  3 cm above sternal notch and no carotid bruit Lungs: diminished breath sounds bilaterally and rales bibasilar Heart: regular rate and rhythm Abdomen: soft, non-tender; bowel sounds normal; no masses,  no organomegaly Extremities: extremities normal, atraumatic, no cyanosis or edema Pulses: 2+ and symmetric Skin: Skin color, texture, turgor normal. No rashes or lesions Neurologic: Grossly normal Psych: Pleasant  Labs   Results for orders placed or performed during the hospital encounter of 07/25/16 (from the past 48 hour(s))  Glucose, capillary      Status: Abnormal   Collection Time: 07/25/16 11:13 AM  Result Value Ref Range   Glucose-Capillary 274 (H) 65 - 99 mg/dL    ECG   Sinus rhythm at 76, LAE, anterolateral 2 mm ST depression- Personally Reviewed  Telemetry   NSR- Personally Reviewed  Radiology   No results found.  Cardiac Studies   None  Assessment   Principal Problem:   Acute on chronic combined systolic and diastolic CHF (congestive heart failure) (HCC) Active Problems:   Essential hypertension   BPH with obstruction/lower urinary tract symptoms   AKI (acute kidney injury) (HCC)   CKD (chronic kidney disease) stage 4, GFR 15-29 ml/min (HCC)   Elevated troponin   Hypothyroidism   Dyslipidemia   Diabetes mellitus, insulin-dependent (IDDM or type I) ([place])   Plan   1. Acute congestive heart failure- unclear if systolic or diastolic at this time. BNP ~20K, in the settting of acute on CKD. Given lasix, but high post-void residual - place foley, continue diuresis (may need 80 mg IV or higher). Repeat 2D echo. Wean bipap as tolerated. 2. Elevated troponin - may be type II, or less likely ACS. On IV heparin - will continue. Aspirin 81 mg daily. Not a cath candidate at this time due to AKI on CKD. 3. Dyslipidemia - continue home statin. Check FLP in am tomorrow. 4. IDDM - continue home 70/30 insulin and SSI. 5. AKI on CKD - may be d/t CHF or worsening renal failure or post-obstruction. Place foley. Continue diuresis. If creatinine rises, would consult nephrology. Avoid nephrotoxins. 6. Anemia - likely chronic disease +/- iron deficiency from CKD, monitor - was on iron therapy. 7. Hypothyroidism - check TSH, continue levothyroxine. 8. Hypertension - restart home BP meds and titrate to goal BP of 120/80 or less. 9. DVT prophylaxis - on IV heparin.  Full code.  Time Spent Directly with Patient:  45 minutes  Length of Stay:  LOS: 0 days   Pixie Casino, MD, [place]    Attending Cardiologist  Direct Dial: [phone]  Fax: [phone]  Website: [email or web address] Jannely Henthorn 07/25/2016, 11:46 AM

## 2016-07-25 NOTE — Consult Note (Signed)
PULMONARY / CRITICAL CARE MEDICINE   Name: [name] MRN: [id number] DOB: [birth year]    ADMISSION DATE:  07/25/2016 CONSULTATION DATE:  6/11  REFERRING MD:  [place]   CHIEF COMPLAINT:  Acute respiratory failure   HISTORY OF PRESENT ILLNESS:   This is a 71 year old male w/ known h/o diastolic dysfunction and CKD stage IV. Presented to [facility] w/ cc: chest pain and dyspnea. Had been progressive over 48hrs. In ER creatinine 3.83, PCXR w/ pulmonary edema, trop mildly elevated, pro-BNP >19K, anterior/lateral ECG depression. Placed on NIPPV and transferred to [facility] for cardiology services to the SDU. Was placed on NIPPV, would desaturate to mid to low 80s off BIPAP. PCCM asked to see given high oxygen and continued BIPAP requirements w/ concern that he may require higher level of support.   PAST MEDICAL HISTORY :  He  has a past medical history of Carotid artery occlusion; Chronic kidney disease; Diabetes mellitus without complication ([place]); and Hypertension.  PAST SURGICAL HISTORY: He  has a past surgical history that includes Lumbar fusion (2005).  No Known Allergies  No current facility-administered medications on file prior to encounter.    No current outpatient prescriptions on file prior to encounter.    FAMILY HISTORY:  His indicated that his mother is deceased. He indicated that his father is deceased.    SOCIAL HISTORY: He  reports that he has quit smoking. He has never used smokeless tobacco. He reports that he does not drink alcohol or use drugs.  REVIEW OF SYSTEMS:   Unable   SUBJECTIVE:  Feels better w/ BIPAP   VITAL SIGNS: BP (!) 184/82   Pulse 89   Temp 97.3 F (36.3 C) (Oral)   Resp (!) 22   Ht 6\' 1"  (1.854 m)   Wt 213 lb 4.8 oz (96.8 kg)   SpO2 (!) 89%   BMI 28.14 kg/m   HEMODYNAMICS:    VENTILATOR SETTINGS:    INTAKE / OUTPUT: No intake/output data recorded.  PHYSICAL EXAMINATION: General appearance:  71 Year old  Male well nourished  currently not in acute distress but states this is because of BIPAP,  Eyes: anicteric sclerae, moist conjunctivae; PERRL, EOMI bilaterally. Mouth:  membranes and no mucosal ulcerations; normal hard and soft palate Neck: Trachea midline; neck supple, no JVD Lungs/chest: diffuse rales, with normal respiratory effort and no intercostal retractions CV: RRR, no MRGs  Abdomen: Soft, non-tender; no masses or HSM Extremities: No peripheral edema or extremity lymphadenopathy Skin: Normal temperature, turgor and texture; no rash, ulcers or subcutaneous nodules Psych: Appropriate affect, alert and oriented to person, place and time  LABS:  BMET No results for input(s): NA, K, CL, CO2, BUN, CREATININE, GLUCOSE in the last 168 hours.  Electrolytes No results for input(s): CALCIUM, MG, PHOS in the last 168 hours.  CBC No results for input(s): WBC, HGB, HCT, PLT in the last 168 hours.  Coag's No results for input(s): APTT, INR in the last 168 hours.  Sepsis Markers No results for input(s): LATICACIDVEN, PROCALCITON, O2SATVEN in the last 168 hours.  ABG No results for input(s): PHART, PCO2ART, PO2ART in the last 168 hours.  Liver Enzymes No results for input(s): AST, ALT, ALKPHOS, BILITOT, ALBUMIN in the last 168 hours.  Cardiac Enzymes  Recent Labs Lab 07/25/16 1238  TROPONINI 6.14*    Glucose  Recent Labs Lab 07/25/16 1113  GLUCAP 274*    Imaging Dg Chest Port 1 View  Result Date: 07/25/2016 CLINICAL DATA:  Admitted  yesterday for respiratory distress. EXAM: PORTABLE CHEST 1 VIEW COMPARISON:  07/25/2016 at 7:33 a.m. and 10/03/2006 FINDINGS: Lungs are adequately inflated demonstrate persistent moderate bilateral perihilar airspace opacification unchanged to slightly worse. No definite effusion or pneumothorax. Stable cardiomegaly. Mild calcified plaque over the aortic arch. IMPRESSION: Moderate bilateral perihilar opacification unchanged to slightly worse. Findings may be due to  significant interstitial edema versus infection. Stable cardiomegaly. Aortic atherosclerosis. Electronically Signed   By: Marin Olp M.D.   On: 07/25/2016 15:55     STUDIES:  ECHO 6/11>>>  CULTURES:   ANTIBIOTICS:   SIGNIFICANT EVENTS:   LINES/TUBES:   DISCUSSION: 71 year old male w/ acute on chronic heart failure, c/b CRI stage IV. Admitted w/ resultant pulmonary edema. Moving to ICU for closer observation, aggressive diuresis and NIPPV. If gets worse would need intubation.   ASSESSMENT / PLAN:  Acute Hypoxic respiratory failure in setting of pulmonary edema pcxr personally reviewed: diffuse pulmonary edema  Plan:   Cont IV lasix Cont NIPPV Move to ICU-->high risk intubation   Acute diastolic +/- systolic Heart failure w/ pulmonary edema  Elevated trop (demand ischemia) H/o secondary PAH -BNP 19,808 Plan:  Lasix 80mg  q12 Cont norvasc 10 daily, coreg 25 bid, bidil 20/37.5 NTG gtt per cards Move to ICU Tele F/u echo Heparin gtt per cards  Acute on chronic renal failure -creatinine 3.83 CKD stage 4 (scr baseline 2.6) H/o BPH Plan:   Adjusted lasix  Renal dose meds Strict I&O Cont oral bicarb tab Cont flomax  Repeat chem in am   Hypothyroidism   Plan:   Cont replacement   DM Plan ssi   FAMILY  - Updates: at bedside   - Inter-disciplinary family meet or Palliative Care meeting due by:  6/18  My ccm time 34 minutes  Erick Colace ACNP-BC [place] Pager # [phone] OR # [phone] if no answer  07/25/2016, 3:59 PM  Attending Note:  I have examined patient, reviewed labs, studies and notes. I have discussed the case with Jerrye Bushy, and I agree with the data and plans as amended above. 71 year old gentleman with a history of hypertension, diastolic CHF, diabetes, stage IV chronic renal insufficiency. He was admitted in the setting of progressive dyspnea, bilateral pulmonary infiltrates and a mildly elevated troponin with  nonspecific EKG changes. He has desaturated has required noninvasive ventilation to maintain adequate saturations and control work of breathing. He has received diuretics, currently 1.5 L negative based on recorded data. He has remained on BiPAP and continues to be labile. On my evaluation today he is comfortable, tolerating BiPAP as long as he is not speaking. When he talks to me he does get some dyspnea and tachypnea. Bilateral inspiratory crackles. Heart regular with distant sounds. Abdomen obese soft nontender. No significant peripheral edema. Chest x-ray shows bilateral diffuse alveolar infiltrates. We will plan to continue his BiPAP, move him to the ICU for closer monitoring as he does have a significant risk for intubation and mechanical ventilation. Increase his diuretics and follow clinical response. Follow blood pressure, renal function. Independent critical care time is 35 minutes.   Baltazar Apo, MD, PhD 07/25/2016, 4:26 PM [facility] [phone] or if no answer [phone]

## 2016-07-25 NOTE — Progress Notes (Signed)
Inserted coude catheter 15fr without difficultly.  Return of 687ml of clear yellow fluid.  Pt tolerated well.

## 2016-07-25 NOTE — Progress Notes (Signed)
ANTICOAGULATION CONSULT NOTE   Pharmacy Consult for Heparin Indication: chest pain/ACS  No Known Allergies  Patient Measurements: Height: 6\' 1"  (185.4 cm) Weight: 213 lb 4.8 oz (96.8 kg) IBW/kg (Calculated) : 79.9 Heparin Dosing Weight:  96.8 kg  Vital Signs: Temp: 100.4 F (38 C) (06/11 2000) Temp Source: Axillary (06/11 2000) BP: 154/63 (06/11 2045) Pulse Rate: 98 (06/11 2045)  Labs:  Recent Labs  07/25/16 1238 07/25/16 1810 07/25/16 2012  HEPARINUNFRC  --   --  0.20*  TROPONINI 6.14* 10.36*  --     CrCl cannot be calculated (Patient's most recent lab result is older than the maximum 21 days allowed.).   Medical History: Past Medical History:  Diagnosis Date  . Carotid artery occlusion   . Chronic kidney disease   . Diabetes mellitus without complication ([place])   . Hypertension     Assessment: CC/HPI: Transferred from OSH with respiratory distress, CP. Suspect B PNA, aortic atheroscloerosis, NSTEMI, acute respiratory failure, CHF, CKD,   PMH: DM, HTN, THR bilaterally, CHF, HTN, HLD, DM, BPH, CKD, prior herniated disk in 2005 sp L3/L4 diskectomy and fusion, mild bilateral ICA stenosis in 2014 and EF 60-65% with moderate LVH by echo in 2014, carotid artery occlusion  Anticoag: Elevated troponin. Transferred to [facility] already on IV heparin Heparin level tonight is low at 0.20. No issues noted. Will adjust rate.    Goal of Therapy:  Heparin level 0.3-0.7 units/ml Monitor platelets by anticoagulation protocol: Yes   Plan:  Increase IV heparin to 1600 units/hr Check HL in 8 hrs after rate increased  Erin Hearing PharmD., BCPS Clinical Pharmacist Pager [phone] 07/25/2016 9:13 PM

## 2016-07-25 NOTE — Progress Notes (Signed)
Patient seen and discussed with family in the room. Remains dyspneic and hypoxic with inability to wean off bipap.   About 700 cc urine out in the foley. Will give additional high dose lasix now. Check STAT portable CXR. Called PCCM to evaluate - hemodynamically stable, but does appear to be working a bit harder to breathe - complaining of cough. On nitro gtts as BP was continuing to rise.  Pixie Casino, MD, [place]  Attending Cardiologist  Direct Dial: [phone]  Fax: [phone]  Website:  [email or web address]

## 2016-07-25 NOTE — Progress Notes (Signed)
Began receiving nursing report from Lindrith, RN, from [facility]; she stated that pt would be arriving on [facility] on BiPap.  [facility] unit is inappropriate to care for pts on BiPap; therefore, pt placement was contacted with request to change room assignment and will contact [facility] in route with this updated information.  Loma Sousa was called with this information.

## 2016-07-26 ENCOUNTER — Observation Stay (HOSPITAL_COMMUNITY): Payer: Medicare Other

## 2016-07-26 DIAGNOSIS — I214 Non-ST elevation (NSTEMI) myocardial infarction: Principal | ICD-10-CM

## 2016-07-26 DIAGNOSIS — I6523 Occlusion and stenosis of bilateral carotid arteries: Secondary | ICD-10-CM | POA: Diagnosis present

## 2016-07-26 DIAGNOSIS — R079 Chest pain, unspecified: Secondary | ICD-10-CM | POA: Diagnosis present

## 2016-07-26 DIAGNOSIS — R338 Other retention of urine: Secondary | ICD-10-CM | POA: Diagnosis not present

## 2016-07-26 DIAGNOSIS — E119 Type 2 diabetes mellitus without complications: Secondary | ICD-10-CM | POA: Diagnosis not present

## 2016-07-26 DIAGNOSIS — I130 Hypertensive heart and chronic kidney disease with heart failure and stage 1 through stage 4 chronic kidney disease, or unspecified chronic kidney disease: Secondary | ICD-10-CM | POA: Diagnosis not present

## 2016-07-26 DIAGNOSIS — D631 Anemia in chronic kidney disease: Secondary | ICD-10-CM | POA: Diagnosis present

## 2016-07-26 DIAGNOSIS — J810 Acute pulmonary edema: Secondary | ICD-10-CM | POA: Diagnosis not present

## 2016-07-26 DIAGNOSIS — N179 Acute kidney failure, unspecified: Secondary | ICD-10-CM | POA: Diagnosis not present

## 2016-07-26 DIAGNOSIS — E1122 Type 2 diabetes mellitus with diabetic chronic kidney disease: Secondary | ICD-10-CM | POA: Diagnosis present

## 2016-07-26 DIAGNOSIS — R0902 Hypoxemia: Secondary | ICD-10-CM | POA: Diagnosis not present

## 2016-07-26 DIAGNOSIS — I2510 Atherosclerotic heart disease of native coronary artery without angina pectoris: Secondary | ICD-10-CM | POA: Diagnosis present

## 2016-07-26 DIAGNOSIS — Z951 Presence of aortocoronary bypass graft: Secondary | ICD-10-CM | POA: Diagnosis not present

## 2016-07-26 DIAGNOSIS — I10 Essential (primary) hypertension: Secondary | ICD-10-CM | POA: Diagnosis not present

## 2016-07-26 DIAGNOSIS — R748 Abnormal levels of other serum enzymes: Secondary | ICD-10-CM | POA: Diagnosis not present

## 2016-07-26 DIAGNOSIS — D62 Acute posthemorrhagic anemia: Secondary | ICD-10-CM | POA: Diagnosis not present

## 2016-07-26 DIAGNOSIS — R5381 Other malaise: Secondary | ICD-10-CM | POA: Diagnosis present

## 2016-07-26 DIAGNOSIS — Z794 Long term (current) use of insulin: Secondary | ICD-10-CM | POA: Diagnosis not present

## 2016-07-26 DIAGNOSIS — I472 Ventricular tachycardia: Secondary | ICD-10-CM | POA: Diagnosis not present

## 2016-07-26 DIAGNOSIS — E1151 Type 2 diabetes mellitus with diabetic peripheral angiopathy without gangrene: Secondary | ICD-10-CM | POA: Diagnosis present

## 2016-07-26 DIAGNOSIS — N184 Chronic kidney disease, stage 4 (severe): Secondary | ICD-10-CM | POA: Diagnosis not present

## 2016-07-26 DIAGNOSIS — I5041 Acute combined systolic (congestive) and diastolic (congestive) heart failure: Secondary | ICD-10-CM | POA: Diagnosis not present

## 2016-07-26 DIAGNOSIS — I255 Ischemic cardiomyopathy: Secondary | ICD-10-CM

## 2016-07-26 DIAGNOSIS — R001 Bradycardia, unspecified: Secondary | ICD-10-CM | POA: Diagnosis not present

## 2016-07-26 DIAGNOSIS — I5021 Acute systolic (congestive) heart failure: Secondary | ICD-10-CM | POA: Diagnosis not present

## 2016-07-26 DIAGNOSIS — E1022 Type 1 diabetes mellitus with diabetic chronic kidney disease: Secondary | ICD-10-CM | POA: Diagnosis not present

## 2016-07-26 DIAGNOSIS — N401 Enlarged prostate with lower urinary tract symptoms: Secondary | ICD-10-CM | POA: Diagnosis present

## 2016-07-26 DIAGNOSIS — K219 Gastro-esophageal reflux disease without esophagitis: Secondary | ICD-10-CM | POA: Diagnosis not present

## 2016-07-26 DIAGNOSIS — N138 Other obstructive and reflux uropathy: Secondary | ICD-10-CM | POA: Diagnosis not present

## 2016-07-26 DIAGNOSIS — E876 Hypokalemia: Secondary | ICD-10-CM | POA: Diagnosis not present

## 2016-07-26 DIAGNOSIS — N170 Acute kidney failure with tubular necrosis: Secondary | ICD-10-CM | POA: Diagnosis not present

## 2016-07-26 DIAGNOSIS — Z01810 Encounter for preprocedural cardiovascular examination: Secondary | ICD-10-CM | POA: Diagnosis not present

## 2016-07-26 DIAGNOSIS — E785 Hyperlipidemia, unspecified: Secondary | ICD-10-CM | POA: Diagnosis present

## 2016-07-26 DIAGNOSIS — E039 Hypothyroidism, unspecified: Secondary | ICD-10-CM | POA: Diagnosis present

## 2016-07-26 DIAGNOSIS — J9601 Acute respiratory failure with hypoxia: Secondary | ICD-10-CM | POA: Diagnosis not present

## 2016-07-26 DIAGNOSIS — I5043 Acute on chronic combined systolic (congestive) and diastolic (congestive) heart failure: Secondary | ICD-10-CM | POA: Diagnosis not present

## 2016-07-26 LAB — POCT I-STAT 3, ART BLOOD GAS (G3+)
Acid-base deficit: 1 mmol/L (ref 0.0–2.0)
Bicarbonate: 23.3 mmol/L (ref 20.0–28.0)
O2 Saturation: 97 %
PH ART: 7.432 (ref 7.350–7.450)
Patient temperature: 98.6
TCO2: 24 mmol/L (ref 0–100)
pCO2 arterial: 35 mmHg (ref 32.0–48.0)
pO2, Arterial: 84 mmHg (ref 83.0–108.0)

## 2016-07-26 LAB — BASIC METABOLIC PANEL
ANION GAP: 12 (ref 5–15)
BUN: 63 mg/dL — ABNORMAL HIGH (ref 6–20)
CO2: 20 mmol/L — ABNORMAL LOW (ref 22–32)
Calcium: 8.9 mg/dL (ref 8.9–10.3)
Chloride: 106 mmol/L (ref 101–111)
Creatinine, Ser: 3.9 mg/dL — ABNORMAL HIGH (ref 0.61–1.24)
GFR calc Af Amer: 16 mL/min — ABNORMAL LOW (ref >60)
GFR, EST NON AFRICAN AMERICAN: 14 mL/min — AB (ref >60)
Glucose, Bld: 205 mg/dL — ABNORMAL HIGH (ref 65–99)
POTASSIUM: 3.5 mmol/L (ref 3.5–5.1)
Sodium: 138 mmol/L (ref 135–145)

## 2016-07-26 LAB — GLUCOSE, CAPILLARY
GLUCOSE-CAPILLARY: 185 mg/dL — AB (ref 65–99)
GLUCOSE-CAPILLARY: 174 mg/dL — AB (ref 65–99)
GLUCOSE-CAPILLARY: 136 mg/dL — AB (ref 65–99)
Glucose-Capillary: 138 mg/dL — ABNORMAL HIGH (ref 65–99)

## 2016-07-26 LAB — HEMOGLOBIN A1C
Hgb A1c MFr Bld: 6.6 % — ABNORMAL HIGH (ref 4.8–5.6)
MEAN PLASMA GLUCOSE: 143 mg/dL

## 2016-07-26 LAB — TROPONIN I
TROPONIN I: 8.27 ng/mL — AB (ref <0.03)
Troponin I: 13.49 ng/mL (ref <0.03)
Troponin I: 7.06 ng/mL (ref <0.03)

## 2016-07-26 LAB — LIPID PANEL
Cholesterol: 99 mg/dL (ref 0–200)
HDL: 38 mg/dL — ABNORMAL LOW (ref >40)
LDL CALC: 45 mg/dL (ref 0–99)
TRIGLYCERIDES: 80 mg/dL (ref <150)
Total CHOL/HDL Ratio: 2.6 RATIO
VLDL: 16 mg/dL (ref 0–40)

## 2016-07-26 LAB — HEPARIN LEVEL (UNFRACTIONATED): HEPARIN UNFRACTIONATED: 0.43 [IU]/mL (ref 0.30–0.70)

## 2016-07-26 LAB — CBC
HCT: 26.7 % — ABNORMAL LOW (ref 39.0–52.0)
HEMOGLOBIN: 8.7 g/dL — AB (ref 13.0–17.0)
MCH: 29.2 pg (ref 26.0–34.0)
MCHC: 32.6 g/dL (ref 30.0–36.0)
MCV: 89.6 fL (ref 78.0–100.0)
Platelets: 160 10*3/uL (ref 150–400)
RBC: 2.98 MIL/uL — ABNORMAL LOW (ref 4.22–5.81)
RDW: 14.3 % (ref 11.5–15.5)
WBC: 8.5 10*3/uL (ref 4.0–10.5)

## 2016-07-26 LAB — MRSA PCR SCREENING: MRSA by PCR: NEGATIVE

## 2016-07-26 NOTE — Progress Notes (Signed)
ANTICOAGULATION CONSULT NOTE   Pharmacy Consult for Heparin Indication: chest pain/ACS  No Known Allergies  Patient Measurements: Height: 6\' 1"  (185.4 cm) Weight: 211 lb 6.7 oz (95.9 kg) IBW/kg (Calculated) : 79.9 Heparin Dosing Weight:  96.8 kg  Vital Signs: Temp: 99.9 F (37.7 C) (06/12 0815) Temp Source: Oral (06/12 0815) BP: 154/72 (06/12 1030) Pulse Rate: 94 (06/12 1030)  Labs:  Recent Labs  07/25/16 1238 07/25/16 1810 07/25/16 2012 07/26/16 0022 07/26/16 0844  HGB  --   --   --  8.7*  --   HCT  --   --   --  26.7*  --   PLT  --   --   --  160  --   HEPARINUNFRC  --   --  0.20*  --  0.43  CREATININE  --   --   --  3.90*  --   TROPONINI 6.14* 10.36*  --  13.49*  --     Estimated Creatinine Clearance: 21.2 mL/min (A) (by C-G formula based on SCr of 3.9 mg/dL (H)).   Medical History: Past Medical History:  Diagnosis Date  . Carotid artery occlusion   . Chronic kidney disease   . Diabetes mellitus without complication ([place])   . Hypertension     Assessment: 71 yo male with NSTEMI on heparin and at goal. Her is also noted with HF and plans noted for cath after diuresis.  Goal of Therapy:  Heparin level 0.3-0.7 units/ml Monitor platelets by anticoagulation protocol: Yes   Plan:  -No heparin changes needed -Will recheck a heparin level in am -Daily heparin level and CBC  Hildred Laser, Pharm D 07/26/2016 10:39 AM

## 2016-07-26 NOTE — Progress Notes (Signed)
Pt taken off bipap and placed on 3L [place]. Pt denies sob, no increased WOB, no resp distress. RN aware. RT will continue to monitor.

## 2016-07-26 NOTE — Progress Notes (Signed)
DAILY PROGRESS NOTE   Patient Name: [name] Date of Encounter: 07/26/2016  Hospital Problem List   Principal Problem:   Acute on chronic combined systolic and diastolic CHF (congestive heart failure) (HCC) Active Problems:   Essential hypertension   BPH with obstruction/lower urinary tract symptoms   AKI (acute kidney injury) ([place])   CKD (chronic kidney disease) stage 4, GFR 15-29 ml/min (HCC)   Elevated troponin   Hypothyroidism   Dyslipidemia   Diabetes mellitus, insulin-dependent (IDDM or type I) ([place])   NSTEMI (non-ST elevated myocardial infarction) [facility])    Chief Complaint   Breathing a little better*  Subjective   Weaned off bipap overnight, but still has a high O2 requirement. Echo yesterday shows newly reduced EF to 40-45% with regional wall motion abnormalities. The troponin continues to rise (now >13).  Creatinine also has climbed with diuresis - now 3.9. He was negative 2.2L overnight. Lipid profile shows low LDL of 45. CXR today shows improved perihilar edema.   Objective   Vitals:   07/26/16 0930 07/26/16 0945 07/26/16 1000 07/26/16 1015  BP: (!) 158/77 (!) 159/75 (!) 153/70 (!) 158/73  Pulse: 97 (!) 103 97 96  Resp: 19 18 (!) 28 (!) 21  Temp:      TempSrc:      SpO2: (!) 89% 90% 90% (!) 88%  Weight:      Height:        Intake/Output Summary (Last 24 hours) at 07/26/16 1018 Last data filed at 07/26/16 1000  Gross per 24 hour  Intake           922.14 ml  Output             3185 ml  Net         -2262.86 ml   Filed Weights   07/25/16 1301 07/26/16 0500  Weight: 213 lb 4.8 oz (96.8 kg) 211 lb 6.7 oz (95.9 kg)    Physical Exam   General appearance: alert, no distress and on nasal cannula Neck: JVD - 3 cm above sternal notch and no carotid bruit Lungs: diminished breath sounds bilaterally and rales bibasilar Heart: regular rate and rhythm Abdomen: soft, non-tender; bowel sounds normal; no masses,  no organomegaly Extremities: extremities  normal, atraumatic, no cyanosis or edema Pulses: 2+ and symmetric Skin: Skin color, texture, turgor normal. No rashes or lesions Neurologic: Grossly normal Psych: Pleasant  Inpatient Medications    Scheduled Meds: . allopurinol  100 mg Oral Daily  . amLODipine  10 mg Oral Daily  . aspirin EC  81 mg Oral Daily  . carvedilol  25 mg Oral BID WC  . chlorhexidine  15 mL Mouth Rinse BID  . cholecalciferol  2,000 Units Oral Daily  . doxazosin  4 mg Oral BID  . furosemide  80 mg Intravenous BID  . insulin aspart  0-9 Units Subcutaneous TID WC  . insulin aspart protamine- aspart  5 Units Subcutaneous BID WC  . levothyroxine  100 mcg Oral QAC breakfast  . lubiprostone  24 mcg Oral BID  . mouth rinse  15 mL Mouth Rinse q12n4p  . rosuvastatin  10 mg Oral QPM  . sodium bicarbonate  650 mg Oral BID  . tamsulosin  0.8 mg Oral QPM    Continuous Infusions: . heparin 1,600 Units/hr (07/26/16 1000)  . nitroGLYCERIN 60 mcg/min (07/26/16 1005)    PRN Meds: acetaminophen, hydrALAZINE, nitroGLYCERIN, ondansetron (ZOFRAN) IV   Labs   Results for orders placed or  performed during the hospital encounter of 07/25/16 (from the past 48 hour(s))  Glucose, capillary     Status: Abnormal   Collection Time: 07/25/16 11:13 AM  Result Value Ref Range   Glucose-Capillary 274 (H) 65 - 99 mg/dL  TSH     Status: None   Collection Time: 07/25/16 12:38 PM  Result Value Ref Range   TSH 0.565 0.350 - 4.500 uIU/mL    Comment: Performed by a 3rd Generation assay with a functional sensitivity of <=0.01 uIU/mL.  Troponin I (q 6hr x 3)     Status: Abnormal   Collection Time: 07/25/16 12:38 PM  Result Value Ref Range   Troponin I 6.14 (HH) <0.03 ng/mL    Comment: CRITICAL RESULT CALLED TO, READ BACK BY AND VERIFIED WITH: S.COOPER,RN 1347 07/25/16 CLARK,S   Hemoglobin A1c     Status: Abnormal   Collection Time: 07/25/16 12:38 PM  Result Value Ref Range   Hgb A1c MFr Bld 6.6 (H) 4.8 - 5.6 %    Comment:  (NOTE)         Pre-diabetes: 5.7 - 6.4         Diabetes: >6.4         Glycemic control for adults with diabetes: <7.0    Mean Plasma Glucose 143 mg/dL    Comment: (NOTE) Performed At: [name] [place], [place] [id number] Lindon Romp MD DP:[id number]   Glucose, capillary     Status: Abnormal   Collection Time: 07/25/16  5:30 PM  Result Value Ref Range   Glucose-Capillary 292 (H) 65 - 99 mg/dL   Comment 1 Notify RN   Troponin I (q 6hr x 3)     Status: Abnormal   Collection Time: 07/25/16  6:10 PM  Result Value Ref Range   Troponin I 10.36 (HH) <0.03 ng/mL    Comment: CRITICAL VALUE NOTED.  VALUE IS CONSISTENT WITH PREVIOUSLY REPORTED AND CALLED VALUE.  Heparin level (unfractionated)     Status: Abnormal   Collection Time: 07/25/16  8:12 PM  Result Value Ref Range   Heparin Unfractionated 0.20 (L) 0.30 - 0.70 IU/mL    Comment:        IF HEPARIN RESULTS ARE BELOW EXPECTED VALUES, AND PATIENT DOSAGE HAS BEEN CONFIRMED, SUGGEST FOLLOW UP TESTING OF ANTITHROMBIN III LEVELS.   Glucose, capillary     Status: Abnormal   Collection Time: 07/25/16 10:36 PM  Result Value Ref Range   Glucose-Capillary 173 (H) 65 - 99 mg/dL  Troponin I (q 6hr x 3)     Status: Abnormal   Collection Time: 07/26/16 12:22 AM  Result Value Ref Range   Troponin I 13.49 (HH) <0.03 ng/mL    Comment: CRITICAL VALUE NOTED.  VALUE IS CONSISTENT WITH PREVIOUSLY REPORTED AND CALLED VALUE.  Lipid panel     Status: Abnormal   Collection Time: 07/26/16 12:22 AM  Result Value Ref Range   Cholesterol 99 0 - 200 mg/dL   Triglycerides 80 <150 mg/dL   HDL 38 (L) >40 mg/dL   Total CHOL/HDL Ratio 2.6 RATIO   VLDL 16 0 - 40 mg/dL   LDL Cholesterol 45 0 - 99 mg/dL    Comment:        Total Cholesterol/HDL:CHD Risk Coronary Heart Disease Risk Table                     Men   Women  1/2 Average Risk   3.4   3.3  Average Risk       5.0   4.4  2 X Average Risk   9.6   7.1  3 X Average Risk   23.4   11.0        Use the calculated Patient Ratio above and the CHD Risk Table to determine the patient's CHD Risk.        ATP III CLASSIFICATION (LDL):  <100     mg/dL   Optimal  100-129  mg/dL   Near or Above                    Optimal  130-159  mg/dL   Borderline  160-189  mg/dL   High  >190     mg/dL   Very High   Basic metabolic panel     Status: Abnormal   Collection Time: 07/26/16 12:22 AM  Result Value Ref Range   Sodium 138 135 - 145 mmol/L   Potassium 3.5 3.5 - 5.1 mmol/L   Chloride 106 101 - 111 mmol/L   CO2 20 (L) 22 - 32 mmol/L   Glucose, Bld 205 (H) 65 - 99 mg/dL   BUN 63 (H) 6 - 20 mg/dL   Creatinine, Ser 3.90 (H) 0.61 - 1.24 mg/dL   Calcium 8.9 8.9 - 10.3 mg/dL   GFR calc non Af Amer 14 (L) >60 mL/min   GFR calc Af Amer 16 (L) >60 mL/min    Comment: (NOTE) The eGFR has been calculated using the CKD EPI equation. This calculation has not been validated in all clinical situations. eGFR's persistently <60 mL/min signify possible Chronic Kidney Disease.    Anion gap 12 5 - 15  CBC     Status: Abnormal   Collection Time: 07/26/16 12:22 AM  Result Value Ref Range   WBC 8.5 4.0 - 10.5 K/uL   RBC 2.98 (L) 4.22 - 5.81 MIL/uL   Hemoglobin 8.7 (L) 13.0 - 17.0 g/dL   HCT 26.7 (L) 39.0 - 52.0 %   MCV 89.6 78.0 - 100.0 fL   MCH 29.2 26.0 - 34.0 pg   MCHC 32.6 30.0 - 36.0 g/dL   RDW 14.3 11.5 - 15.5 %   Platelets 160 150 - 400 K/uL  MRSA PCR Screening     Status: None   Collection Time: 07/26/16  5:07 AM  Result Value Ref Range   MRSA by PCR NEGATIVE NEGATIVE    Comment:        The GeneXpert MRSA Assay (FDA approved for NASAL specimens only), is one component of a comprehensive MRSA colonization surveillance program. It is not intended to diagnose MRSA infection nor to guide or monitor treatment for MRSA infections.   Glucose, capillary     Status: Abnormal   Collection Time: 07/26/16  8:23 AM  Result Value Ref Range   Glucose-Capillary 185 (H) 65 -  99 mg/dL  Heparin level (unfractionated)     Status: None   Collection Time: 07/26/16  8:44 AM  Result Value Ref Range   Heparin Unfractionated 0.43 0.30 - 0.70 IU/mL    Comment:        IF HEPARIN RESULTS ARE BELOW EXPECTED VALUES, AND PATIENT DOSAGE HAS BEEN CONFIRMED, SUGGEST FOLLOW UP TESTING OF ANTITHROMBIN III LEVELS.   I-STAT 3, arterial blood gas (G3+)     Status: None   Collection Time: 07/26/16  8:49 AM  Result Value Ref Range   pH, Arterial 7.432 7.350 - 7.450   pCO2 arterial 35.0  32.0 - 48.0 mmHg   pO2, Arterial 84.0 83.0 - 108.0 mmHg   Bicarbonate 23.3 20.0 - 28.0 mmol/L   TCO2 24 0 - 100 mmol/L   O2 Saturation 97.0 %   Acid-base deficit 1.0 0.0 - 2.0 mmol/L   Patient temperature 98.6 F    Collection site RADIAL, ALLEN'S TEST ACCEPTABLE    Drawn by RT    Sample type ARTERIAL     ECG   None today  Telemetry   NSR in 80's - Personally Reviewed  Radiology    Dg Chest Port 1 View  Result Date: 07/26/2016 CLINICAL DATA:  Pt states he came in with a heart attack Saturday, hx htn, diabetes, past smoker is on a bipap, pt states no chest complaints or sob, he thinks her feels fine, RN states r/o pneumonia EXAM: PORTABLE CHEST 1 VIEW COMPARISON:  07/25/2016; 10/03/2006; 07/18/2003 FINDINGS: Grossly unchanged enlarged cardiac silhouette and mediastinal contours with atherosclerotic plaque within the thoracic aorta. Pulmonary vasculature remains indistinct with cephalization of flow. Overall improved aeration of the lungs with persistent perihilar heterogeneous airspace opacities. No definite pleural effusion, though note, the bilateral costophrenic angles excluded from view. No pneumothorax. No acute osseus abnormalities. IMPRESSION: Findings most suggestive of improved perihilar alveolar pulmonary edema. Continued attention on follow-up is recommended. Electronically Signed   By: Sandi Mariscal M.D.   On: 07/26/2016 09:00   Dg Chest Port 1 View  Result Date:  07/25/2016 CLINICAL DATA:  Admitted yesterday for respiratory distress. EXAM: PORTABLE CHEST 1 VIEW COMPARISON:  07/25/2016 at 7:33 a.m. and 10/03/2006 FINDINGS: Lungs are adequately inflated demonstrate persistent moderate bilateral perihilar airspace opacification unchanged to slightly worse. No definite effusion or pneumothorax. Stable cardiomegaly. Mild calcified plaque over the aortic arch. IMPRESSION: Moderate bilateral perihilar opacification unchanged to slightly worse. Findings may be due to significant interstitial edema versus infection. Stable cardiomegaly. Aortic atherosclerosis. Electronically Signed   By: Marin Olp M.D.   On: 07/25/2016 15:55    Cardiac Studies   LV EF: 40% -   45%  ------------------------------------------------------------------- Indications:      CHF - 428.0.  ------------------------------------------------------------------- History:   Risk factors:  Hypertension. Diabetes mellitus. Dyslipidemia.  ------------------------------------------------------------------- Study Conclusions  - Left ventricle: The cavity size was normal. There was moderate   concentric hypertrophy. Systolic function was mildly to   moderately reduced. The estimated ejection fraction was in the   range of 40% to 45%. Mid to distal inferior, apical, lateral and   apical septal severe hypokinesis. Doppler parameters are   consistent with abnormal left ventricular relaxation (grade 1   diastolic dysfunction). The E/e&' ratio is >15, suggesting   elevated LV filling pressure. - Aortic valve: Sclerosis without stenosis. There was no   regurgitation. - Mitral valve: Mildly thickened leaflets . There was trivial   regurgitation. - Left atrium: The atrium was normal in size. - Tricuspid valve: There was mild regurgitation. - Pulmonary arteries: PA peak pressure: 40 mm Hg (S). - Inferior vena cava: The vessel was dilated. The respirophasic   diameter changes were blunted (<  50%), consistent with elevated   central venous pressure.  Impressions:  - Compared to a prior study in 2014, the EF is lower at 40-45% with   regional wall motion abnormalities possibly in the LCX territory   distribution, there is diastolic dysfunction with elevated LV   filling pressure, mild pulmonary hypertension and no pericardial   effusion.  Assessment   1. Principal Problem: 2.   Acute  on chronic combined systolic and diastolic CHF (congestive heart failure) ([place]) 3. Active Problems: 4.   Essential hypertension 5.   BPH with obstruction/lower urinary tract symptoms 6.   AKI (acute kidney injury) ([place]) 7.   CKD (chronic kidney disease) stage 4, GFR 15-29 ml/min (HCC) 8.   Elevated troponin 9.   Hypothyroidism 10.   Dyslipidemia 11.   Diabetes mellitus, insulin-dependent (IDDM or type I) ([place]) 12.   NSTEMI (non-ST elevated myocardial infarction) ([place]) 13.   Plan   1. [name] appears improved somewhat today - he has had 2.2L diuresis. CXR improved - weaned to nasal cannula, but feels dyspneic and wants to go back on Bipap. Still has more fluid to remove. Creatinine bumped to 3.9, but not far from baseline. Echo shows new cardiomyopathy, ischemia with WMA's and troponin rising - on heparin and nitroglycerin. No chest pain currently. Will need to be optimized from a CHF standpoint before we can consider heart catheterization. Will continue to trend troponins. Continue IV diuresis.  Time Spent Directly with Patient:  25 minutes  Length of Stay:  LOS: 1 day   Pixie Casino, MD, [place]  Attending Cardiologist  Direct Dial: [phone]  Fax: [phone]  Website:  [email or web address] Douglas 07/26/2016, 10:18 AM

## 2016-07-26 NOTE — Progress Notes (Signed)
RT NOTE:  During RT evening rounds patient asked for break off of BIPAP. WOB normal, no SOB noted. Pt titrated to 6L [place]. BIPAP is available @ bedside. Pt understands to call RN/RT if he feels breathing is more difficult. Call bell within reach.

## 2016-07-26 NOTE — Progress Notes (Signed)
PULMONARY / CRITICAL CARE MEDICINE   Name: [name] MRN: [id number] DOB: 11-23-1945    ADMISSION DATE:  07/25/2016 CONSULTATION DATE:  6/11  REFERRING MD:  Hilty   CHIEF COMPLAINT:  Acute respiratory failure   HISTORY OF PRESENT ILLNESS:   This is a 71 year old male w/ known h/o diastolic dysfunction and CKD stage IV. Presented to [facility] w/ cc: chest pain and dyspnea. Had been progressive over 48hrs. In ER creatinine 3.83, PCXR w/ pulmonary edema, trop mildly elevated, pro-BNP >19K, anterior/lateral ECG depression. Placed on NIPPV and transferred to [facility] for cardiology services to the SDU. Was placed on NIPPV, would desaturate to mid to low 80s off BIPAP. PCCM asked to see given high oxygen and continued BIPAP requirements w/ concern that he may require higher level of support.      SUBJECTIVE:  Wants to come off bipap  VITAL SIGNS: BP (!) 161/79 (BP Location: Right Arm)   Pulse 87   Temp 98.9 F (37.2 C) (Axillary)   Resp 20   Ht 6\' 1"  (1.854 m)   Wt 211 lb 6.7 oz (95.9 kg)   SpO2 98%   BMI 27.89 kg/m   HEMODYNAMICS:    VENTILATOR SETTINGS: FiO2 (%):  [60 %-100 %] 60 %  INTAKE / OUTPUT: I/O last 3 completed shifts: In: 691.1 [I.V.:691.1] Out: 2925 [Urine:2925]  PHYSICAL EXAMINATION: General:  WN obese AAM on bipap but awake and pain free HEENT: MM pink/moist, No JVD/LAN PSY: Nl affect Neuro: Inatct CV: s1s2 rrr, no m/r/g PULM: even/non-labored, lungs bilaterally decreased bs HQ:IONG, non-tender, bsx4 active  Extremities: warm/dry, + edema  Skin: no rashes or lesions  LABS:  BMET  Recent Labs Lab 07/26/16 0022  NA 138  K 3.5  CL 106  CO2 20*  BUN 63*  CREATININE 3.90*  GLUCOSE 205*    Electrolytes  Recent Labs Lab 07/26/16 0022  CALCIUM 8.9    CBC  Recent Labs Lab 07/26/16 0022  WBC 8.5  HGB 8.7*  HCT 26.7*  PLT 160    Coag's No results for input(s): APTT, INR in the last 168 hours.  Sepsis Markers No results  for input(s): LATICACIDVEN, PROCALCITON, O2SATVEN in the last 168 hours.  ABG No results for input(s): PHART, PCO2ART, PO2ART in the last 168 hours.  Liver Enzymes No results for input(s): AST, ALT, ALKPHOS, BILITOT, ALBUMIN in the last 168 hours.  Cardiac Enzymes  Recent Labs Lab 07/25/16 1238 07/25/16 1810 07/26/16 0022  TROPONINI 6.14* 10.36* 13.49*    Glucose  Recent Labs Lab 07/25/16 1113 07/25/16 1730 07/25/16 2236  GLUCAP 274* 292* 173*    Imaging Dg Chest Port 1 View  Result Date: 07/25/2016 CLINICAL DATA:  Admitted yesterday for respiratory distress. EXAM: PORTABLE CHEST 1 VIEW COMPARISON:  07/25/2016 at 7:33 a.m. and 10/03/2006 FINDINGS: Lungs are adequately inflated demonstrate persistent moderate bilateral perihilar airspace opacification unchanged to slightly worse. No definite effusion or pneumothorax. Stable cardiomegaly. Mild calcified plaque over the aortic arch. IMPRESSION: Moderate bilateral perihilar opacification unchanged to slightly worse. Findings may be due to significant interstitial edema versus infection. Stable cardiomegaly. Aortic atherosclerosis. Electronically Signed   By: Marin Olp M.D.   On: 07/25/2016 15:55     STUDIES:  ECHO 6/11>>>  CULTURES:   ANTIBIOTICS:   SIGNIFICANT EVENTS:   LINES/TUBES:   DISCUSSION: 70 year old male w/ acute on chronic heart failure, c/b CRI stage IV. Admitted w/ resultant pulmonary edema. Moved to ICU for closer observation, aggressive  diuresis and NIPPV. If gets worse would need intubation.   ASSESSMENT / PLAN:  Acute Hypoxic respiratory failure in setting of pulmonary edema pcxr 6/12 ordered and pending Plan:   Cont IV lasix Cont NIPPV ICU Check abg if OK try off nimvs with now negative I/O Still high risk for intubation with worsening renal failure  Acute diastolic +/- systolic Heart failure w/ pulmonary edema  Elevated trop (demand ischemia) H/o secondary PAH -BNP 19,808 Plan:   Lasix 80mg  q12(note rising creatine) Cont norvasc 10 daily, coreg 25 bid, bidil 20/37.5 NTG gtt per cards  ICU F/u echo, done but not read as of 6/12 0800 Heparin gtt per cards  Acute on chronic renal failure Lab Results  Component Value Date   CREATININE 3.90 (H) 07/26/2016   CREATININE 1.91 (H) 04/03/2008   CREATININE 1.80 (H) 10/03/2006    Intake/Output Summary (Last 24 hours) at 07/26/16 6004 Last data filed at 07/26/16 0800  Gross per 24 hour  Intake           728.14 ml  Output             2925 ml  Net         -2196.86 ml    Recent Labs Lab 07/26/16 0022  K 3.5     -creatinine 3.83 CKD stage 4 (scr baseline 2.6) H/o BPH Plan:   Adjusted lasix  Renal dose meds Strict I&O Cont oral bicarb tab Cont flomax  Repeat chem May need renal consult  Hypothyroidism   Plan:   Cont replacement   DM Plan ssi   FAMILY  - Updates: Pt updated at bedside 6/12. He would like to come off bipap.  - Inter-disciplinary family meet or Palliative Care meeting due by:  6/18  My ccm time 30 minutes  Richardson Landry Minor ACNP Maryanna Shape PCCM Pager [phone] till 3 pm If no answer page [phone] 07/26/2016, 8:07 AM  Attending Note:  71 year old male with extensive cardiac disease presenting to PCCM with respiratory failure due to pulmonary edema on BiPAP.  On exam, diffuse crackles noted [name] at the bases.  I reviewed CXR myself, pulmonary edema noted.  Continue lasix, check ABG now, will discuss BiPAP if ABG is ok.  Ambulate when able.  PT.  IS.  Flutter valve.  Diet.  PCCM will follow.  The patient is critically ill with multiple organ systems failure and requires high complexity decision making for assessment and support, frequent evaluation and titration of therapies, application of advanced monitoring technologies and extensive interpretation of multiple databases.   Critical Care Time devoted to patient care services described in this note is  35  Minutes. This time reflects  time of care of this signee Dr Jennet Maduro. This critical care time does not reflect procedure time, or teaching time or supervisory time of PA/NP/Med student/Med Resident etc but could involve care discussion time.  Rush Farmer, M.D. [facility] Pulmonary/Critical Care Medicine. Pager: [phone]. After hours pager: [phone].

## 2016-07-26 NOTE — Progress Notes (Signed)
Pt with increased WOB, c/o sob on 4L [place]. Pt placed back on bipap at this time. RN aware, RT will closely monitor pt

## 2016-07-26 NOTE — Progress Notes (Signed)
PT Cancellation Note  Patient Details Name: [name] MRN: [id number] DOB: [birth year]   Cancelled Treatment:    Reason Eval/Treat Not Completed: Patient not medically ready. RN deferred today due to pt being on BiPap. PT to return as able.   Kloey Cazarez M Vergene Marland 07/26/2016, 3:19 PM  Kittie Plater, PT, DPT Pager #: [phone] Office #: [phone]

## 2016-07-27 ENCOUNTER — Inpatient Hospital Stay (HOSPITAL_COMMUNITY): Payer: Medicare Other

## 2016-07-27 DIAGNOSIS — J9601 Acute respiratory failure with hypoxia: Secondary | ICD-10-CM

## 2016-07-27 DIAGNOSIS — R0902 Hypoxemia: Secondary | ICD-10-CM

## 2016-07-27 DIAGNOSIS — J810 Acute pulmonary edema: Secondary | ICD-10-CM

## 2016-07-27 LAB — CBC
HEMATOCRIT: 25.9 % — AB (ref 39.0–52.0)
Hemoglobin: 8.4 g/dL — ABNORMAL LOW (ref 13.0–17.0)
MCH: 29.4 pg (ref 26.0–34.0)
MCHC: 32.4 g/dL (ref 30.0–36.0)
MCV: 90.6 fL (ref 78.0–100.0)
PLATELETS: 160 10*3/uL (ref 150–400)
RBC: 2.86 MIL/uL — AB (ref 4.22–5.81)
RDW: 14.3 % (ref 11.5–15.5)
WBC: 7 10*3/uL (ref 4.0–10.5)

## 2016-07-27 LAB — BASIC METABOLIC PANEL
ANION GAP: 11 (ref 5–15)
ANION GAP: 12 (ref 5–15)
BUN: 65 mg/dL — ABNORMAL HIGH (ref 6–20)
BUN: 67 mg/dL — AB (ref 6–20)
CO2: 23 mmol/L (ref 22–32)
CO2: 22 mmol/L (ref 22–32)
Calcium: 8.8 mg/dL — ABNORMAL LOW (ref 8.9–10.3)
Calcium: 9.3 mg/dL (ref 8.9–10.3)
Chloride: 103 mmol/L (ref 101–111)
Chloride: 105 mmol/L (ref 101–111)
Creatinine, Ser: 3.71 mg/dL — ABNORMAL HIGH (ref 0.61–1.24)
Creatinine, Ser: 3.67 mg/dL — ABNORMAL HIGH (ref 0.61–1.24)
GFR calc Af Amer: 18 mL/min — ABNORMAL LOW (ref >60)
GFR calc non Af Amer: 15 mL/min — ABNORMAL LOW (ref >60)
GFR, EST AFRICAN AMERICAN: 17 mL/min — AB (ref >60)
GFR, EST NON AFRICAN AMERICAN: 15 mL/min — AB (ref >60)
Glucose, Bld: 145 mg/dL — ABNORMAL HIGH (ref 65–99)
Glucose, Bld: 168 mg/dL — ABNORMAL HIGH (ref 65–99)
POTASSIUM: 3.6 mmol/L (ref 3.5–5.1)
Potassium: 3.1 mmol/L — ABNORMAL LOW (ref 3.5–5.1)
SODIUM: 139 mmol/L (ref 135–145)
SODIUM: 137 mmol/L (ref 135–145)

## 2016-07-27 LAB — TROPONIN I: TROPONIN I: 7.89 ng/mL — AB (ref <0.03)

## 2016-07-27 LAB — GLUCOSE, CAPILLARY
GLUCOSE-CAPILLARY: 171 mg/dL — AB (ref 65–99)
GLUCOSE-CAPILLARY: 160 mg/dL — AB (ref 65–99)
GLUCOSE-CAPILLARY: 231 mg/dL — AB (ref 65–99)

## 2016-07-27 LAB — MAGNESIUM: MAGNESIUM: 2.1 mg/dL (ref 1.7–2.4)

## 2016-07-27 LAB — HEPARIN LEVEL (UNFRACTIONATED): Heparin Unfractionated: 0.47 IU/mL (ref 0.30–0.70)

## 2016-07-27 LAB — PHOSPHORUS: PHOSPHORUS: 3.2 mg/dL (ref 2.5–4.6)

## 2016-07-27 MED ORDER — POTASSIUM CHLORIDE CRYS ER 20 MEQ PO TBCR
40.0000 meq | EXTENDED_RELEASE_TABLET | Freq: Once | ORAL | Status: AC
  Administered 2016-07-27: 40 meq via ORAL
  Filled 2016-07-27: qty 2

## 2016-07-27 NOTE — Progress Notes (Signed)
PULMONARY / CRITICAL CARE MEDICINE   Name: TARRENCE ENCK MRN: [id number] DOB: 1945-11-10    ADMISSION DATE:  07/25/2016 CONSULTATION DATE:  6/11  REFERRING MD:  Hilty   CHIEF COMPLAINT:  Acute respiratory failure   HISTORY OF PRESENT ILLNESS:   This is a 71 year old male w/ known h/o diastolic dysfunction and CKD stage IV. Presented to [facility] w/ cc: chest pain and dyspnea. Had been progressive over 48hrs. In ER creatinine 3.83, PCXR w/ pulmonary edema, trop mildly elevated, pro-BNP >19K, anterior/lateral ECG depression. Placed on NIPPV and transferred to [facility] for cardiology services to the SDU. Was placed on NIPPV, would desaturate to mid to low 80s off BIPAP. PCCM asked to see given high oxygen and continued BIPAP requirements w/ concern that he may require higher level of support.    SUBJECTIVE:  Tolerated BiPAP well overnight.  Required a break but later placed back on.  -3.7L net, CXR improved.  VITAL SIGNS: BP (!) 156/64   Pulse 79   Temp 99.3 F (37.4 C) (Oral)   Resp 16   Ht 6\' 1"  (1.854 m)   Wt 90.3 kg (199 lb 1.2 oz)   SpO2 95%   BMI 26.26 kg/m   HEMODYNAMICS:    VENTILATOR SETTINGS: FiO2 (%):  [40 %-60 %] 40 %  INTAKE / OUTPUT: I/O last 3 completed shifts: In: 1693.8 [P.O.:510; I.V.:1183.8] Out: 3550 [Urine:3550]  PHYSICAL EXAMINATION: General:  Adult male, in NAD, sitting in chair HEENT: MM pink/moist, No JVD/LAN PSY: Nl affect Neuro: Inatct CV: s1s2 rrr, no m/r/g PULM: even/non-labored, lungs bilaterally diminished in bases PN:TIRW, non-tender, bsx4 active  Extremities: warm/dry, 1+ edema  Skin: no rashes or lesions  LABS:  BMET  Recent Labs Lab 07/26/16 0022 07/27/16 0055  NA 138 137  K 3.5 3.1*  CL 106 103  CO2 20* 22  BUN 63* 65*  CREATININE 3.90* 3.71*  GLUCOSE 205* 145*    Electrolytes  Recent Labs Lab 07/26/16 0022 07/27/16 0055  CALCIUM 8.9 8.8*  MG  --  2.1  PHOS  --  3.2    CBC  Recent Labs Lab  07/26/16 0022 07/27/16 0055  WBC 8.5 7.0  HGB 8.7* 8.4*  HCT 26.7* 25.9*  PLT 160 160    Coag's No results for input(s): APTT, INR in the last 168 hours.  Sepsis Markers No results for input(s): LATICACIDVEN, PROCALCITON, O2SATVEN in the last 168 hours.  ABG  Recent Labs Lab 07/26/16 0849  PHART 7.432  PCO2ART 35.0  PO2ART 84.0    Liver Enzymes No results for input(s): AST, ALT, ALKPHOS, BILITOT, ALBUMIN in the last 168 hours.  Cardiac Enzymes  Recent Labs Lab 07/26/16 1303 07/26/16 1832 07/27/16 0055  TROPONINI 8.27* 7.06* 7.89*    Glucose  Recent Labs Lab 07/25/16 1730 07/25/16 2236 07/26/16 0823 07/26/16 1224 07/26/16 1643 07/26/16 2214  GLUCAP 292* 173* 185* 174* 138* 136*    Imaging Dg Chest Port 1 View  Result Date: 07/26/2016 CLINICAL DATA:  Pt states he came in with a heart attack Saturday, hx htn, diabetes, past smoker is on a bipap, pt states no chest complaints or sob, he thinks her feels fine, RN states r/o pneumonia EXAM: PORTABLE CHEST 1 VIEW COMPARISON:  07/25/2016; 10/03/2006; 07/18/2003 FINDINGS: Grossly unchanged enlarged cardiac silhouette and mediastinal contours with atherosclerotic plaque within the thoracic aorta. Pulmonary vasculature remains indistinct with cephalization of flow. Overall improved aeration of the lungs with persistent perihilar heterogeneous airspace opacities. No definite pleural  effusion, though note, the bilateral costophrenic angles excluded from view. No pneumothorax. No acute osseus abnormalities. IMPRESSION: Findings most suggestive of improved perihilar alveolar pulmonary edema. Continued attention on follow-up is recommended. Electronically Signed   By: Sandi Mariscal M.D.   On: 07/26/2016 09:00     STUDIES:  ECHO 6/11 > EF 40 - 45%, G1DD, PAP 40.  CULTURES: None. ANTIBIOTICS: None. SIGNIFICANT EVENTS: 6/11 > admit. LINES/TUBES: None.  DISCUSSION: 71 year old male w/ acute on chronic heart failure,  c/b CRI stage IV. Admitted w/ resultant pulmonary edema. Moved to ICU for closer observation, aggressive diuresis and NIPPV. If gets worse would need intubation.   ASSESSMENT / PLAN:  Acute Hypoxic respiratory failure in setting of acute pulmonary edema - gradually resolving Plan:   Cont IV lasix Cont NIPPV as needed Still at risk for intubation with worsening renal failure Incentive spirometry Mobilize as able Needs ambulatory desaturation study prior to d/c to assess for home O2 needs  Acute combined Heart failure w/ acute pulmonary edema  NSTEMI H/o secondary PAH Plan:  Continue IV lasix Cont norvasc 10 daily, coreg 25 bid NTG gtt per cards Heparin gtt per cards  Acute on chronic renal failure Hypokalemia - s/p repletion AM 6/13 H/o BPH Plan:   Continue IV lasix Renal dose meds Strict I&O May need renal consult Cont oral bicarb tab, flomax  Hypothyroidism   Plan:   Cont synthroid  DM Plan ssi   FAMILY  - Updates: Pt updated at bedside 6/13.  - Inter-disciplinary family meet or Palliative Care meeting due by:  6/18  CC time 30 minutes   Montey Hora, [place] Loletha Grayer [place] [facility] Pager: [phone]  or [phone] 07/27/2016, 7:42 AM  Attending Note:  71 year old male with MI and subsequent pulmonary edema.  Required BIPAP overnight but appears much better today on exam.  I reviewed CXR myself, improving pulmonary edema noted.  Discussed with Dr. Debara Pickett and [place].  Acute pulmonary edema:  - Diureses as ordered  Hypoxemia:  - Titrate O2 for sat of 88-92%  Acute respiratory failure:  - D/C BiPAP  - O2 as above  MI:  - Cath today  PCCM will sign off, please call back if needed.  Patient seen and examined, agree with above note.  I dictated the care and orders written for this patient under my direction.  Rush Farmer, [place]

## 2016-07-27 NOTE — Evaluation (Signed)
Physical Therapy Evaluation Patient Details Name: [name] MRN: [id number] DOB: [birth year] Today's Date: 07/27/2016   History of Present Illness  71 year old male w/ acute on chronic heart failure, c/b CRI stage IV. Admitted w/ resultant pulmonary edema. As of 6/13, moved to ICU for closer observation, aggressive diuresis and NIPPV.   Clinical Impression   Pt admitted with above diagnosis. Pt currently with functional limitations due to the deficits listed below (see PT Problem List). Present with overall weakness and decr activity tolerance;  Pt will benefit from skilled PT to increase their independence and safety with mobility to allow discharge to the venue listed below.       Follow Up Recommendations Home health PT;Supervision/Assistance - 24 hour    Equipment Recommendations  Rolling walker with 5" wheels;3in1 (PT)    Recommendations for Other Services       Precautions / Restrictions Precautions Precautions: Fall Precaution Comments: decr functional capacity; 6 L supplemental O2      Mobility  Bed Mobility                  Transfers Overall transfer level: Needs assistance Equipment used: Rolling walker (2 wheeled) Transfers: Sit to/from Stand Sit to Stand: Mod assist         General transfer comment: Light mod assist to power up  Ambulation/Gait Ambulation/Gait assistance: Min assist;+2 safety/equipment Ambulation Distance (Feet): 40 Feet Assistive device: Rolling walker (2 wheeled) Gait Pattern/deviations: Step-through pattern Gait velocity: slow   General Gait Details: Cues to self-monitor for activity tolerance  Stairs            Wheelchair Mobility    Modified Rankin (Stroke Patients Only)       Balance Overall balance assessment: Needs assistance   Sitting balance-Leahy Scale: Fair       Standing balance-Leahy Scale: Poor Standing balance comment: benefits from UE support                              Pertinent Vitals/Pain Pain Assessment: No/denies pain    Home Living Family/patient expects to be discharged to:: Private residence Living Arrangements: Spouse/significant other;Children Available Help at Discharge: Family;Available 24 hours/day Type of Home: Apartment Home Access: Level entry (in front 3 stpe in back no rails)     Home Layout: One level Home Equipment: None Additional Comments: Enjoys going to church    Prior Function Level of Independence: Independent               Hand Dominance        Extremity/Trunk Assessment   Upper Extremity Assessment Upper Extremity Assessment: Generalized weakness    Lower Extremity Assessment Lower Extremity Assessment: Generalized weakness       Communication   Communication: No difficulties  Cognition Arousal/Alertness: Awake/alert Behavior During Therapy: WFL for tasks assessed/performed Overall Cognitive Status: Within Functional Limits for tasks assessed                                        General Comments General comments (skin integrity, edema, etc.): VSS; HR range 69-80bpm; O2 sats remained at or above 94% on 6 liters via [place]    Exercises     Assessment/Plan    PT Assessment Patient needs continued PT services  PT Problem List Decreased strength;Decreased activity tolerance;Decreased balance;Decreased mobility;Decreased knowledge of use of  DME;Cardiopulmonary status limiting activity       PT Treatment Interventions DME instruction;Gait training;Stair training;Functional mobility training;Therapeutic activities;Therapeutic exercise;Balance training;Patient/family education    PT Goals (Current goals can be found in the Care Plan section)  Acute Rehab PT Goals Patient Stated Goal: Back to church PT Goal Formulation: With patient Time For Goal Achievement: 08/10/16 Potential to Achieve Goals: Good    Frequency Min 3X/week   Barriers to discharge        Co-evaluation                AM-PAC PT "6 Clicks" Daily Activity  Outcome Measure Difficulty turning over in bed (including adjusting bedclothes, sheets and blankets)?: Total Difficulty moving from lying on back to sitting on the side of the bed? : Total Difficulty sitting down on and standing up from a chair with arms (e.g., wheelchair, bedside commode, etc,.)?: Total Help needed moving to and from a bed to chair (including a wheelchair)?: A Little Help needed walking in hospital room?: A Little Help needed climbing 3-5 steps with a railing? : A Lot 6 Click Score: 11    End of Session Equipment Utilized During Treatment: Gait belt;Oxygen Activity Tolerance: Patient tolerated treatment well Patient left: in chair;with call bell/phone within reach Nurse Communication: Mobility status PT Visit Diagnosis: Unsteadiness on feet (R26.81);Muscle weakness (generalized) (M62.81)    Time: 4562-5638 PT Time Calculation (min) (ACUTE ONLY): 21 min   Charges:   PT Evaluation $PT Eval Moderate Complexity: 1 Procedure     PT G Codes:        Roney Marion, PT  Acute Rehabilitation Services Pager [phone] Office [phone]   [place] 07/27/2016, 12:48 PM

## 2016-07-27 NOTE — Progress Notes (Signed)
DAILY PROGRESS NOTE   Patient Name: [name] Date of Encounter: 07/27/2016  Hospital Problem List   Principal Problem:   Acute on chronic combined systolic and diastolic CHF (congestive heart failure) (HCC) Active Problems:   Essential hypertension   BPH with obstruction/lower urinary tract symptoms   AKI (acute kidney injury) ([place])   CKD (chronic kidney disease) stage 4, GFR 15-29 ml/min (HCC)   Elevated troponin   Hypothyroidism   Dyslipidemia   Diabetes mellitus, insulin-dependent (IDDM or type I) ([place])   NSTEMI (non-ST elevated myocardial infarction) [facility])    Chief Complaint   Breathing improved  Subjective   On bipap overnight. Diuresed another 1.5L negative (total 3.7L negative). Troponin trending down. Creatinine improving with diuresis (3.71 yesterday) - BMET pending today. Weight down 4 lbs with diuresis.  Objective   Vitals:   07/27/16 0300 07/27/16 0400 07/27/16 0500 07/27/16 0600  BP: (!) 165/75 (!) 153/48 (!) 154/66 (!) 159/68  Pulse: 73 74 81 80  Resp: _0 (!) 21  Temp:  99.3 F (37.4 C)    TempSrc:  Oral    SpO2: 98% 99% 99% 97%  Weight:   199 lb 1.2 oz (90.3 kg)   Height:        Intake/Output Summary (Last 24 hours) at 07/27/16 0912 Last data filed at 07/27/16 0600  Gross per 24 hour  Intake          1012.23 ml  Output             2465 ml  Net         -1452.77 ml   Filed Weights   07/25/16 1301 07/26/16 0500 07/27/16 0500  Weight: 213 lb 4.8 oz (96.8 kg) 211 lb 6.7 oz (95.9 kg) 199 lb 1.2 oz (90.3 kg)    Physical Exam   General appearance: alert, no distress and sitting up in the chair on nasal cannula Neck: JVD - 1 cm above sternal notch, no carotid bruit and thyroid not enlarged, symmetric, no tenderness/mass/nodules Lungs: diminished breath sounds bibasilar Heart: regular rate and rhythm Abdomen: soft, non-tender; bowel sounds normal; no masses,  no organomegaly Extremities: extremities normal, atraumatic, no cyanosis or  edema Pulses: 2+ and symmetric Skin: Skin color, texture, turgor normal. No rashes or lesions Neurologic: Grossly normal Psych: Pleasant  Inpatient Medications    Scheduled Meds: . allopurinol  100 mg Oral Daily  . amLODipine  10 mg Oral Daily  . aspirin EC  81 mg Oral Daily  . carvedilol  25 mg Oral BID WC  . chlorhexidine  15 mL Mouth Rinse BID  . cholecalciferol  2,000 Units Oral Daily  . doxazosin  4 mg Oral BID  . furosemide  80 mg Intravenous BID  . insulin aspart  0-9 Units Subcutaneous TID WC  . insulin aspart protamine- aspart  5 Units Subcutaneous BID WC  . levothyroxine  100 mcg Oral QAC breakfast  . lubiprostone  24 mcg Oral BID  . mouth rinse  15 mL Mouth Rinse q12n4p  . rosuvastatin  10 mg Oral QPM  . sodium bicarbonate  650 mg Oral BID  . tamsulosin  0.8 mg Oral QPM    Continuous Infusions: . heparin 1,600 Units/hr (07/27/16 0600)  . nitroGLYCERIN 35 mcg/min (07/27/16 0600)    PRN Meds: acetaminophen, hydrALAZINE, nitroGLYCERIN, ondansetron (ZOFRAN) IV   Labs   Results for orders placed or performed during the hospital encounter of 07/25/16 (from the past 48 hour(s))  Glucose,  capillary     Status: Abnormal   Collection Time: 07/25/16 11:13 AM  Result Value Ref Range   Glucose-Capillary 274 (H) 65 - 99 mg/dL  TSH     Status: None   Collection Time: 07/25/16 12:38 PM  Result Value Ref Range   TSH 0.565 0.350 - 4.500 uIU/mL    Comment: Performed by a 3rd Generation assay with a functional sensitivity of <=0.01 uIU/mL.  Troponin I (q 6hr x 3)     Status: Abnormal   Collection Time: 07/25/16 12:38 PM  Result Value Ref Range   Troponin I 6.14 (HH) <0.03 ng/mL    Comment: CRITICAL RESULT CALLED TO, READ BACK BY AND VERIFIED WITH: S.COOPER,RN 1347 07/25/16 CLARK,S   Hemoglobin A1c     Status: Abnormal   Collection Time: 07/25/16 12:38 PM  Result Value Ref Range   Hgb A1c MFr Bld 6.6 (H) 4.8 - 5.6 %    Comment: (NOTE)         Pre-diabetes: 5.7 - 6.4          Diabetes: >6.4         Glycemic control for adults with diabetes: <7.0    Mean Plasma Glucose 143 mg/dL    Comment: (NOTE) Performed At: [facility] [place], [place] [id number] Lindon Romp MD HF:[id number]   Glucose, capillary     Status: Abnormal   Collection Time: 07/25/16  5:30 PM  Result Value Ref Range   Glucose-Capillary 292 (H) 65 - 99 mg/dL   Comment 1 Notify RN   Troponin I (q 6hr x 3)     Status: Abnormal   Collection Time: 07/25/16  6:10 PM  Result Value Ref Range   Troponin I 10.36 (HH) <0.03 ng/mL    Comment: CRITICAL VALUE NOTED.  VALUE IS CONSISTENT WITH PREVIOUSLY REPORTED AND CALLED VALUE.  Heparin level (unfractionated)     Status: Abnormal   Collection Time: 07/25/16  8:12 PM  Result Value Ref Range   Heparin Unfractionated 0.20 (L) 0.30 - 0.70 IU/mL    Comment:        IF HEPARIN RESULTS ARE BELOW EXPECTED VALUES, AND PATIENT DOSAGE HAS BEEN CONFIRMED, SUGGEST FOLLOW UP TESTING OF ANTITHROMBIN III LEVELS.   Glucose, capillary     Status: Abnormal   Collection Time: 07/25/16 10:36 PM  Result Value Ref Range   Glucose-Capillary 173 (H) 65 - 99 mg/dL  Troponin I (q 6hr x 3)     Status: Abnormal   Collection Time: 07/26/16 12:22 AM  Result Value Ref Range   Troponin I 13.49 (HH) <0.03 ng/mL    Comment: CRITICAL VALUE NOTED.  VALUE IS CONSISTENT WITH PREVIOUSLY REPORTED AND CALLED VALUE.  Lipid panel     Status: Abnormal   Collection Time: 07/26/16 12:22 AM  Result Value Ref Range   Cholesterol 99 0 - 200 mg/dL   Triglycerides 80 <150 mg/dL   HDL 38 (L) >40 mg/dL   Total CHOL/HDL Ratio 2.6 RATIO   VLDL 16 0 - 40 mg/dL   LDL Cholesterol 45 0 - 99 mg/dL    Comment:        Total Cholesterol/HDL:CHD Risk Coronary Heart Disease Risk Table                     Men   Women  1/2 Average Risk   3.4   3.3  Average Risk       5.0   4.4  2  X Average Risk   9.6   7.1  3 X Average Risk  23.4   11.0        Use the calculated  Patient Ratio above and the CHD Risk Table to determine the patient's CHD Risk.        ATP III CLASSIFICATION (LDL):  <100     mg/dL   Optimal  100-129  mg/dL   Near or Above                    Optimal  130-159  mg/dL   Borderline  160-189  mg/dL   High  >190     mg/dL   Very High   Basic metabolic panel     Status: Abnormal   Collection Time: 07/26/16 12:22 AM  Result Value Ref Range   Sodium 138 135 - 145 mmol/L   Potassium 3.5 3.5 - 5.1 mmol/L   Chloride 106 101 - 111 mmol/L   CO2 20 (L) 22 - 32 mmol/L   Glucose, Bld 205 (H) 65 - 99 mg/dL   BUN 63 (H) 6 - 20 mg/dL   Creatinine, Ser 3.90 (H) 0.61 - 1.24 mg/dL   Calcium 8.9 8.9 - 10.3 mg/dL   GFR calc non Af Amer 14 (L) >60 mL/min   GFR calc Af Amer 16 (L) >60 mL/min    Comment: (NOTE) The eGFR has been calculated using the CKD EPI equation. This calculation has not been validated in all clinical situations. eGFR's persistently <60 mL/min signify possible Chronic Kidney Disease.    Anion gap 12 5 - 15  CBC     Status: Abnormal   Collection Time: 07/26/16 12:22 AM  Result Value Ref Range   WBC 8.5 4.0 - 10.5 K/uL   RBC 2.98 (L) 4.22 - 5.81 MIL/uL   Hemoglobin 8.7 (L) 13.0 - 17.0 g/dL   HCT 26.7 (L) 39.0 - 52.0 %   MCV 89.6 78.0 - 100.0 fL   MCH 29.2 26.0 - 34.0 pg   MCHC 32.6 30.0 - 36.0 g/dL   RDW 14.3 11.5 - 15.5 %   Platelets 160 150 - 400 K/uL  MRSA PCR Screening     Status: None   Collection Time: 07/26/16  5:07 AM  Result Value Ref Range   MRSA by PCR NEGATIVE NEGATIVE    Comment:        The GeneXpert MRSA Assay (FDA approved for NASAL specimens only), is one component of a comprehensive MRSA colonization surveillance program. It is not intended to diagnose MRSA infection nor to guide or monitor treatment for MRSA infections.   Glucose, capillary     Status: Abnormal   Collection Time: 07/26/16  8:23 AM  Result Value Ref Range   Glucose-Capillary 185 (H) 65 - 99 mg/dL  Heparin level  (unfractionated)     Status: None   Collection Time: 07/26/16  8:44 AM  Result Value Ref Range   Heparin Unfractionated 0.43 0.30 - 0.70 IU/mL    Comment:        IF HEPARIN RESULTS ARE BELOW EXPECTED VALUES, AND PATIENT DOSAGE HAS BEEN CONFIRMED, SUGGEST FOLLOW UP TESTING OF ANTITHROMBIN III LEVELS.   I-STAT 3, arterial blood gas (G3+)     Status: None   Collection Time: 07/26/16  8:49 AM  Result Value Ref Range   pH, Arterial 7.432 7.350 - 7.450   pCO2 arterial 35.0 32.0 - 48.0 mmHg   pO2, Arterial 84.0 83.0 - 108.0 mmHg  Bicarbonate 23.3 20.0 - 28.0 mmol/L   TCO2 24 0 - 100 mmol/L   O2 Saturation 97.0 %   Acid-base deficit 1.0 0.0 - 2.0 mmol/L   Patient temperature 98.6 F    Collection site RADIAL, ALLEN'S TEST ACCEPTABLE    Drawn by RT    Sample type ARTERIAL   Glucose, capillary     Status: Abnormal   Collection Time: 07/26/16 12:24 PM  Result Value Ref Range   Glucose-Capillary 174 (H) 65 - 99 mg/dL   Comment 1 Notify RN   Troponin I     Status: Abnormal   Collection Time: 07/26/16  1:03 PM  Result Value Ref Range   Troponin I 8.27 (HH) <0.03 ng/mL    Comment: CRITICAL VALUE NOTED.  VALUE IS CONSISTENT WITH PREVIOUSLY REPORTED AND CALLED VALUE.  Glucose, capillary     Status: Abnormal   Collection Time: 07/26/16  4:43 PM  Result Value Ref Range   Glucose-Capillary 138 (H) 65 - 99 mg/dL   Comment 1 Notify RN   Troponin I     Status: Abnormal   Collection Time: 07/26/16  6:32 PM  Result Value Ref Range   Troponin I 7.06 (HH) <0.03 ng/mL    Comment: CRITICAL VALUE NOTED.  VALUE IS CONSISTENT WITH PREVIOUSLY REPORTED AND CALLED VALUE.  Glucose, capillary     Status: Abnormal   Collection Time: 07/26/16 10:14 PM  Result Value Ref Range   Glucose-Capillary 136 (H) 65 - 99 mg/dL   Comment 1 Capillary Specimen   Troponin I     Status: Abnormal   Collection Time: 07/27/16 12:55 AM  Result Value Ref Range   Troponin I 7.89 (HH) <0.03 ng/mL    Comment: CRITICAL VALUE  NOTED.  VALUE IS CONSISTENT WITH PREVIOUSLY REPORTED AND CALLED VALUE.  CBC     Status: Abnormal   Collection Time: 07/27/16 12:55 AM  Result Value Ref Range   WBC 7.0 4.0 - 10.5 K/uL   RBC 2.86 (L) 4.22 - 5.81 MIL/uL   Hemoglobin 8.4 (L) 13.0 - 17.0 g/dL   HCT 25.9 (L) 39.0 - 52.0 %   MCV 90.6 78.0 - 100.0 fL   MCH 29.4 26.0 - 34.0 pg   MCHC 32.4 30.0 - 36.0 g/dL   RDW 14.3 11.5 - 15.5 %   Platelets 160 150 - 400 K/uL  Heparin level (unfractionated)     Status: None   Collection Time: 07/27/16 12:55 AM  Result Value Ref Range   Heparin Unfractionated 0.47 0.30 - 0.70 IU/mL    Comment:        IF HEPARIN RESULTS ARE BELOW EXPECTED VALUES, AND PATIENT DOSAGE HAS BEEN CONFIRMED, SUGGEST FOLLOW UP TESTING OF ANTITHROMBIN III LEVELS.   Basic metabolic panel     Status: Abnormal   Collection Time: 07/27/16 12:55 AM  Result Value Ref Range   Sodium 137 135 - 145 mmol/L   Potassium 3.1 (L) 3.5 - 5.1 mmol/L   Chloride 103 101 - 111 mmol/L   CO2 22 22 - 32 mmol/L   Glucose, Bld 145 (H) 65 - 99 mg/dL   BUN 65 (H) 6 - 20 mg/dL   Creatinine, Ser 3.71 (H) 0.61 - 1.24 mg/dL   Calcium 8.8 (L) 8.9 - 10.3 mg/dL   GFR calc non Af Amer 15 (L) >60 mL/min   GFR calc Af Amer 17 (L) >60 mL/min    Comment: (NOTE) The eGFR has been calculated using the CKD EPI equation. This calculation   has not been validated in all clinical situations. eGFR's persistently <60 mL/min signify possible Chronic Kidney Disease.    Anion gap 12 5 - 15  Magnesium     Status: None   Collection Time: 07/27/16 12:55 AM  Result Value Ref Range   Magnesium 2.1 1.7 - 2.4 mg/dL  Phosphorus     Status: None   Collection Time: 07/27/16 12:55 AM  Result Value Ref Range   Phosphorus 3.2 2.5 - 4.6 mg/dL  Glucose, capillary     Status: Abnormal   Collection Time: 07/27/16  8:23 AM  Result Value Ref Range   Glucose-Capillary 171 (H) 65 - 99 mg/dL    ECG   None today  Telemetry   NSR in 80's - Personally  Reviewed  Radiology    Dg Chest Port 1 View  Result Date: 07/27/2016 CLINICAL DATA:  Shortness of breath, respiratory failure. History of hypertension, diabetes, CHF, coronary artery disease, chronic renal insufficiency. EXAM: PORTABLE CHEST 1 VIEW COMPARISON:  Portable chest x-ray of July 26, 2016 FINDINGS: The lungs are well-expanded. The interstitial markings have improved significantly. The pulmonary vascularity is less engorged. The heart is top-normal in size. There is calcification in the wall of the aortic arch. The bony thorax exhibits no acute abnormality. IMPRESSION: Decreased pulmonary edema bilaterally consistent with improving CHF. No alveolar pneumonia. Electronically Signed   By: David  Jordan M.D.   On: 07/27/2016 07:56   Dg Chest Port 1 View  Result Date: 07/26/2016 CLINICAL DATA:  Pt states he came in with a heart attack Saturday, hx htn, diabetes, past smoker is on a bipap, pt states no chest complaints or sob, he thinks her feels fine, RN states r/o pneumonia EXAM: PORTABLE CHEST 1 VIEW COMPARISON:  07/25/2016; 10/03/2006; 07/18/2003 FINDINGS: Grossly unchanged enlarged cardiac silhouette and mediastinal contours with atherosclerotic plaque within the thoracic aorta. Pulmonary vasculature remains indistinct with cephalization of flow. Overall improved aeration of the lungs with persistent perihilar heterogeneous airspace opacities. No definite pleural effusion, though note, the bilateral costophrenic angles excluded from view. No pneumothorax. No acute osseus abnormalities. IMPRESSION: Findings most suggestive of improved perihilar alveolar pulmonary edema. Continued attention on follow-up is recommended. Electronically Signed   By: John  Watts M.D.   On: 07/26/2016 09:00   Dg Chest Port 1 View  Result Date: 07/25/2016 CLINICAL DATA:  Admitted yesterday for respiratory distress. EXAM: PORTABLE CHEST 1 VIEW COMPARISON:  07/25/2016 at 7:33 a.m. and 10/03/2006 FINDINGS: Lungs are  adequately inflated demonstrate persistent moderate bilateral perihilar airspace opacification unchanged to slightly worse. No definite effusion or pneumothorax. Stable cardiomegaly. Mild calcified plaque over the aortic arch. IMPRESSION: Moderate bilateral perihilar opacification unchanged to slightly worse. Findings may be due to significant interstitial edema versus infection. Stable cardiomegaly. Aortic atherosclerosis. Electronically Signed   By: Daniel  Boyle M.D.   On: 07/25/2016 15:55    Cardiac Studies   LV EF: 40% -   45%  ------------------------------------------------------------------- Indications:      CHF - 428.0.  ------------------------------------------------------------------- History:   Risk factors:  Hypertension. Diabetes mellitus. Dyslipidemia.  ------------------------------------------------------------------- Study Conclusions  - Left ventricle: The cavity size was normal. There was moderate   concentric hypertrophy. Systolic function was mildly to   moderately reduced. The estimated ejection fraction was in the   range of 40% to 45%. Mid to distal inferior, apical, lateral and   apical septal severe hypokinesis. Doppler parameters are   consistent with abnormal left ventricular relaxation (grade 1   diastolic dysfunction). The   E/e&' ratio is >15, suggesting   elevated LV filling pressure. - Aortic valve: Sclerosis without stenosis. There was no   regurgitation. - Mitral valve: Mildly thickened leaflets . There was trivial   regurgitation. - Left atrium: The atrium was normal in size. - Tricuspid valve: There was mild regurgitation. - Pulmonary arteries: PA peak pressure: 40 mm Hg (S). - Inferior vena cava: The vessel was dilated. The respirophasic   diameter changes were blunted (< 50%), consistent with elevated   central venous pressure.  Impressions:  - Compared to a prior study in 2014, the EF is lower at 40-45% with   regional wall motion  abnormalities possibly in the LCX territory   distribution, there is diastolic dysfunction with elevated LV   filling pressure, mild pulmonary hypertension and no pericardial   effusion.  Assessment   Principal Problem:   Acute on chronic combined systolic and diastolic CHF (congestive heart failure) (HCC) Active Problems:   Essential hypertension   BPH with obstruction/lower urinary tract symptoms   AKI (acute kidney injury) (HCC)   CKD (chronic kidney disease) stage 4, GFR 15-29 ml/min (HCC)   Elevated troponin   Hypothyroidism   Dyslipidemia   Diabetes mellitus, insulin-dependent (IDDM or type I) (HCC)   NSTEMI (non-ST elevated myocardial infarction) [facility])   Plan   [name] continues to improve with diuresis - creatinine has improved. Today's values are pending. Ok to start diet today since he is off bipap. Continue IV diuresis - plan for tentative left heart catheterization tomorrow at 12 pm with Dr. Irish Lack - with possible percutaneous intervention for NSTEMI. Creatinine was 2.65 in 2015 (sees Dr. Hinda Lenis with nephrology). Continue IV Heparin and medical therapy for NSTEMI. [place] for clear liquids tomorrow at breakfast then NPO afterward.  Time Spent Directly with Patient:  25 minutes  Length of Stay:  LOS: 2 days   Pixie Casino, MD, [place]  Attending Cardiologist  Direct Dial: [phone]  Fax: [phone]  Website:  [email or web address]  Nadean Corwin Hilty 07/27/2016, 9:12 AM

## 2016-07-27 NOTE — Progress Notes (Signed)
eLink Physician-Brief Progress Note Patient Name: CORDIE BUENING DOB: 10-31-1945 MRN: [id number]   Date of Service  07/27/2016  HPI/Events of Note    eICU Interventions  Hypokalemia -repleted Although cr high, diuresing with lasix     Intervention Category Intermediate Interventions: Electrolyte abnormality - evaluation and management  Yaneliz Radebaugh V. 07/27/2016, 2:24 AM

## 2016-07-27 NOTE — Care Management Note (Signed)
Case Management Note Marvetta Gibbons RN, BSN Unit 2W-Case Manager-- [place] coverage [phone]  Patient Details  Name: [name] MRN: [id number] Date of Birth: [birth year]  Subjective/Objective:  Pt admitted with acute on Chronic HF and NSTEMI from outside hospital - aggressive diuresis                 Action/Plan: PTA pt lived at home with spouse- plan for cardiac cath on 6/14- CM to follow for  d/c needs-- per PT eval recommendations for HHPT and DME-RW and 3n1- will need orders prior to discharge.   Expected Discharge Date:                  Expected Discharge Plan:     In-House Referral:     Discharge planning Services  CM Consult  Post Acute Care Choice:    Choice offered to:     DME Arranged:    DME Agency:     HH Arranged:    HH Agency:     Status of Service:  In process, will continue to follow  If discussed at Long Length of Stay Meetings, dates discussed:    Discharge Disposition:   Additional Comments:  Dawayne Patricia, RN 07/27/2016, 2:26 PM

## 2016-07-27 NOTE — Progress Notes (Signed)
[place] for Heparin Indication: chest pain/ACS  No Known Allergies  Patient Measurements: Height: 6\' 1"  (185.4 cm) Weight: 199 lb 1.2 oz (90.3 kg) IBW/kg (Calculated) : 79.9 Heparin Dosing Weight:  96.8 kg  Vital Signs: Temp: 99.3 F (37.4 C) (06/13 0400) Temp Source: Oral (06/13 0400) BP: 159/68 (06/13 0600) Pulse Rate: 80 (06/13 0600)  Labs:  Recent Labs  07/25/16 2012 07/26/16 0022 07/26/16 0844 07/26/16 1303 07/26/16 1832 07/27/16 0055  HGB  --  8.7*  --   --   --  8.4*  HCT  --  26.7*  --   --   --  25.9*  PLT  --  160  --   --   --  160  HEPARINUNFRC 0.20*  --  0.43  --   --  0.47  CREATININE  --  3.90*  --   --   --  3.71*  TROPONINI  --  13.49*  --  8.27* 7.06* 7.89*    Estimated Creatinine Clearance: 20.6 mL/min (A) (by C-G formula based on SCr of 3.71 mg/dL (H)).   Medical History: Past Medical History:  Diagnosis Date  . Carotid artery occlusion   . Chronic kidney disease   . Diabetes mellitus without complication ([place])   . Hypertension     Assessment: 71 yo male with NSTEMI on heparin and at goal. Her is also noted with HF and plans noted for cath on 6/14.  Goal of Therapy:  Heparin level 0.3-0.7 units/ml Monitor platelets by anticoagulation protocol: Yes   Plan:  -No heparin changes needed -Daily heparin level and CBC  Hildred Laser, Pharm D 07/27/2016 9:30 AM

## 2016-07-28 ENCOUNTER — Encounter (HOSPITAL_COMMUNITY)
Admission: AD | Disposition: A | Discharge status: Skilled Nursing Facility | Payer: Self-pay | Source: Other Acute Inpatient Hospital | Attending: Internal Medicine

## 2016-07-28 ENCOUNTER — Inpatient Hospital Stay (HOSPITAL_COMMUNITY): Payer: Medicare Other

## 2016-07-28 ENCOUNTER — Encounter (HOSPITAL_COMMUNITY): Payer: Self-pay | Admitting: Interventional Cardiology

## 2016-07-28 DIAGNOSIS — I2510 Atherosclerotic heart disease of native coronary artery without angina pectoris: Secondary | ICD-10-CM

## 2016-07-28 DIAGNOSIS — I214 Non-ST elevation (NSTEMI) myocardial infarction: Secondary | ICD-10-CM

## 2016-07-28 DIAGNOSIS — N184 Chronic kidney disease, stage 4 (severe): Secondary | ICD-10-CM

## 2016-07-28 DIAGNOSIS — J810 Acute pulmonary edema: Secondary | ICD-10-CM

## 2016-07-28 DIAGNOSIS — I5041 Acute combined systolic (congestive) and diastolic (congestive) heart failure: Secondary | ICD-10-CM

## 2016-07-28 HISTORY — PX: LEFT HEART CATH AND CORONARY ANGIOGRAPHY: CATH118249

## 2016-07-28 LAB — BASIC METABOLIC PANEL
ANION GAP: 13 (ref 5–15)
BUN: 71 mg/dL — AB (ref 6–20)
CHLORIDE: 102 mmol/L (ref 101–111)
CO2: 20 mmol/L — AB (ref 22–32)
Calcium: 8.9 mg/dL (ref 8.9–10.3)
Creatinine, Ser: 3.41 mg/dL — ABNORMAL HIGH (ref 0.61–1.24)
GFR calc Af Amer: 19 mL/min — ABNORMAL LOW (ref >60)
GFR calc non Af Amer: 17 mL/min — ABNORMAL LOW (ref >60)
GLUCOSE: 219 mg/dL — AB (ref 65–99)
POTASSIUM: 3.3 mmol/L — AB (ref 3.5–5.1)
Sodium: 135 mmol/L (ref 135–145)

## 2016-07-28 LAB — GLUCOSE, CAPILLARY
GLUCOSE-CAPILLARY: 141 mg/dL — AB (ref 65–99)
GLUCOSE-CAPILLARY: 223 mg/dL — AB (ref 65–99)
Glucose-Capillary: 227 mg/dL — ABNORMAL HIGH (ref 65–99)
Glucose-Capillary: 267 mg/dL — ABNORMAL HIGH (ref 65–99)

## 2016-07-28 LAB — CBC
HEMATOCRIT: 26 % — AB (ref 39.0–52.0)
HEMOGLOBIN: 8.3 g/dL — AB (ref 13.0–17.0)
MCH: 28.6 pg (ref 26.0–34.0)
MCHC: 31.9 g/dL (ref 30.0–36.0)
MCV: 89.7 fL (ref 78.0–100.0)
Platelets: 173 10*3/uL (ref 150–400)
RBC: 2.9 MIL/uL — AB (ref 4.22–5.81)
RDW: 13.9 % (ref 11.5–15.5)
WBC: 6 10*3/uL (ref 4.0–10.5)

## 2016-07-28 LAB — PROTIME-INR
INR: 1.09
PROTHROMBIN TIME: 14.2 s (ref 11.4–15.2)

## 2016-07-28 LAB — HEPARIN LEVEL (UNFRACTIONATED): Heparin Unfractionated: <0.1 IU/mL — ABNORMAL LOW (ref 0.30–0.70)

## 2016-07-28 SURGERY — LEFT HEART CATH AND CORONARY ANGIOGRAPHY
Anesthesia: LOCAL

## 2016-07-28 MED ORDER — HEPARIN (PORCINE) IN NACL 2-0.9 UNIT/ML-% IJ SOLN
INTRAMUSCULAR | Status: AC | PRN
  Administered 2016-07-28: 1000 mL

## 2016-07-28 MED ORDER — SODIUM CHLORIDE 0.9% FLUSH
3.0000 mL | Freq: Two times a day (BID) | INTRAVENOUS | Status: DC
  Administered 2016-07-28 – 2016-08-04 (×13): 3 mL via INTRAVENOUS

## 2016-07-28 MED ORDER — SODIUM CHLORIDE 0.9 % IV SOLN
INTRAVENOUS | Status: DC
  Administered 2016-07-28: 07:00:00 via INTRAVENOUS

## 2016-07-28 MED ORDER — ACETAMINOPHEN 325 MG PO TABS
650.0000 mg | ORAL_TABLET | ORAL | Status: DC | PRN
  Administered 2016-07-29 – 2016-08-01 (×9): 650 mg via ORAL
  Filled 2016-07-28 (×9): qty 2

## 2016-07-28 MED ORDER — HEPARIN (PORCINE) IN NACL 100-0.45 UNIT/ML-% IJ SOLN: 1600.0000 [IU]/h | INTRAMUSCULAR | Status: DC

## 2016-07-28 MED ORDER — FENTANYL CITRATE (PF) 100 MCG/2ML IJ SOLN
INTRAMUSCULAR | Status: AC
  Filled 2016-07-28: qty 2

## 2016-07-28 MED ORDER — ONDANSETRON HCL 4 MG/2ML IJ SOLN: 4.0000 mg | Freq: Four times a day (QID) | INTRAMUSCULAR | Status: DC | PRN

## 2016-07-28 MED ORDER — LIDOCAINE HCL (PF) 1 % IJ SOLN
INTRAMUSCULAR | Status: DC | PRN
  Administered 2016-07-28: 2 mL

## 2016-07-28 MED ORDER — IOPAMIDOL (ISOVUE-370) INJECTION 76%
INTRAVENOUS | Status: DC | PRN
  Administered 2016-07-28: 60 mL via INTRA_ARTERIAL

## 2016-07-28 MED ORDER — VERAPAMIL HCL 2.5 MG/ML IV SOLN
INTRAVENOUS | Status: AC
  Filled 2016-07-28: qty 2

## 2016-07-28 MED ORDER — SODIUM CHLORIDE 0.9 % IV SOLN
250.0000 mL | INTRAVENOUS | Status: DC | PRN
  Administered 2016-07-29: 250 mL via INTRAVENOUS
  Administered 2016-08-05 (×2): via INTRAVENOUS

## 2016-07-28 MED ORDER — MIDAZOLAM HCL 2 MG/2ML IJ SOLN
INTRAMUSCULAR | Status: AC
  Filled 2016-07-28: qty 2

## 2016-07-28 MED ORDER — SODIUM CHLORIDE 0.9 % IV SOLN: INTRAVENOUS | Status: DC

## 2016-07-28 MED ORDER — SODIUM CHLORIDE 0.9% FLUSH: 3.0000 mL | INTRAVENOUS | Status: DC | PRN

## 2016-07-28 MED ORDER — SODIUM CHLORIDE 0.9 % IV SOLN: 250.0000 mL | INTRAVENOUS | Status: DC | PRN

## 2016-07-28 MED ORDER — HEPARIN SODIUM (PORCINE) 1000 UNIT/ML IJ SOLN
INTRAMUSCULAR | Status: AC
  Filled 2016-07-28: qty 1

## 2016-07-28 MED ORDER — HEPARIN (PORCINE) IN NACL 100-0.45 UNIT/ML-% IJ SOLN
1600.0000 [IU]/h | INTRAMUSCULAR | Status: DC
  Administered 2016-07-28: 1600 [IU]/h via INTRAVENOUS
  Filled 2016-07-28: qty 250

## 2016-07-28 MED ORDER — SODIUM CHLORIDE 0.9% FLUSH
3.0000 mL | INTRAVENOUS | Status: DC | PRN
Start: 2016-07-28 — End: 2016-08-05

## 2016-07-28 MED ORDER — MIDAZOLAM HCL 2 MG/2ML IJ SOLN
INTRAMUSCULAR | Status: DC | PRN
  Administered 2016-07-28: 2 mg via INTRAVENOUS

## 2016-07-28 MED ORDER — LIDOCAINE HCL (PF) 1 % IJ SOLN
INTRAMUSCULAR | Status: AC
  Filled 2016-07-28: qty 30

## 2016-07-28 MED ORDER — SODIUM CHLORIDE 0.9% FLUSH
3.0000 mL | Freq: Two times a day (BID) | INTRAVENOUS | Status: DC
  Administered 2016-07-28: 3 mL via INTRAVENOUS

## 2016-07-28 MED ORDER — SODIUM CHLORIDE 0.9 % IV SOLN: INTRAVENOUS | Status: AC

## 2016-07-28 MED ORDER — HEPARIN SODIUM (PORCINE) 1000 UNIT/ML IJ SOLN
INTRAMUSCULAR | Status: DC | PRN
  Administered 2016-07-28: 5000 [IU] via INTRAVENOUS

## 2016-07-28 MED ORDER — VERAPAMIL HCL 2.5 MG/ML IV SOLN
INTRAVENOUS | Status: DC | PRN
  Administered 2016-07-28: 10 mL via INTRA_ARTERIAL

## 2016-07-28 MED ORDER — FENTANYL CITRATE (PF) 100 MCG/2ML IJ SOLN
INTRAMUSCULAR | Status: DC | PRN
  Administered 2016-07-28: 25 ug via INTRAVENOUS

## 2016-07-28 MED ORDER — ASPIRIN 81 MG PO CHEW
81.0000 mg | CHEWABLE_TABLET | Freq: Every day | ORAL | Status: DC
  Administered 2016-07-29 – 2016-08-04 (×7): 81 mg via ORAL
  Filled 2016-07-28 (×7): qty 1

## 2016-07-28 MED ORDER — HEPARIN (PORCINE) IN NACL 2-0.9 UNIT/ML-% IJ SOLN
INTRAMUSCULAR | Status: AC
Start: 2016-07-28 — End: ?
  Filled 2016-07-28: qty 1000

## 2016-07-28 MED ORDER — IOPAMIDOL (ISOVUE-370) INJECTION 76%
INTRAVENOUS | Status: AC
  Filled 2016-07-28: qty 100

## 2016-07-28 MED ORDER — POTASSIUM CHLORIDE CRYS ER 20 MEQ PO TBCR
40.0000 meq | EXTENDED_RELEASE_TABLET | Freq: Once | ORAL | Status: AC
  Administered 2016-07-28: 40 meq via ORAL
  Filled 2016-07-28: qty 2

## 2016-07-28 SURGICAL SUPPLY — 9 items
CATH 5FR JL3.5 JR4 ANG PIG MP (CATHETERS) ×2 IMPLANT
DEVICE RAD COMP TR BAND LRG (VASCULAR PRODUCTS) ×2 IMPLANT
GLIDESHEATH SLEND SS 6F .021 (SHEATH) ×2 IMPLANT
GUIDEWIRE INQWIRE 1.5J.035X260 (WIRE) ×1 IMPLANT
INQWIRE 1.5J .035X260CM (WIRE) ×2
KIT HEART LEFT (KITS) ×2 IMPLANT
PACK CARDIAC CATHETERIZATION (CUSTOM PROCEDURE TRAY) ×2 IMPLANT
TRANSDUCER W/STOPCOCK (MISCELLANEOUS) ×2 IMPLANT
TUBING CIL FLEX 10 FLL-RA (TUBING) ×2 IMPLANT

## 2016-07-28 NOTE — Progress Notes (Signed)
[address]       [place] [address]             [phone]        [name] [place] Medical Record #[id number] Date of Birth: 1945/09/02  Referring: Dr Irish Lack Primary Care: Foye Spurling, MD Renal:Dr. Hinda Lenis in [place] Chief Complaint:   Acute MI/respiratory failure    History of Present Illness:     Patient presented 5 days ago with respiratory failure. AT church service Saturday night he felt poorly and became sob. He presented to [facility]. With  progressive dyspnea, ultimately brought in by EMS. He has required BIPAP . Labs significant for Creatinine of 3.83 (GFR 19), Troponin T of 0.26, BNP of 19,808, anemia with H/H 9.1/27.5, glucose of 261, no leukocytosis. CXR shows mild cardiomegaly, CHF/consolidation - suspect pneumonia with superimposed pulmonary edema/ARDS, EKG shows NSR at 76 (personally reviewed) with anterior and lateral ST depression.    Current Activity/ Functional Status: Patient is independent with mobility/ambulation, transfers, ADL's, IADL's.   Zubrod Score: At the time of surgery this patient's most appropriate activity status/level should be described as: []     0    Normal activity, no symptoms [x]     1    Restricted in physical strenuous activity but ambulatory, able to do out light work []     2    Ambulatory and capable of self care, unable to do work activities, up and about                 more than 50%  Of the time                            []     3    Only limited self care, in bed greater than 50% of waking hours []     4    Completely disabled, no self care, confined to bed or chair []     5    Moribund  Past Medical History:  Diagnosis Date  . Carotid artery occlusion   . Chronic kidney disease   . Diabetes mellitus without complication ([place])   . Hypertension     Past Surgical History:  Procedure Laterality Date  . LEFT HEART CATH AND CORONARY ANGIOGRAPHY N/A 07/28/2016   Procedure: Left Heart Cath and  Coronary Angiography;  Surgeon: Jettie Booze, MD;  Location: [facility];  Service: Cardiovascular;  Laterality: N/A;  . LUMBAR FUSION  2005    History  Smoking Status  . Former Smoker  Smokeless Tobacco  . Never Used    History  Alcohol Use No    Social History   Social History  . Marital status: Married    Spouse name: N/A  . Number of children: N/A  . Years of education: N/A   Occupational History  . Not on file.   Social History Main Topics  . Smoking status: Former Research scientist (life sciences)  . Smokeless tobacco: Never Used  . Alcohol use No  . Drug use: No  . Sexual activity: Not on file   No Known Allergies  Current Facility-Administered Medications  Medication Dose Route Frequency Provider Last Rate Last Dose  . 0.9 %  sodium chloride infusion  250 mL Intravenous PRN Jettie Booze, MD      . acetaminophen (TYLENOL) tablet 650 mg  650 mg Oral Q4H PRN Jettie Booze, MD      .  allopurinol (ZYLOPRIM) tablet 100 mg  100 mg Oral Daily Bhagat, Bhavinkumar, PA   100 mg at 07/28/16 0942  . amLODipine (NORVASC) tablet 10 mg  10 mg Oral Daily Bhagat, Bhavinkumar, PA   10 mg at 07/28/16 0942  . aspirin chewable tablet 81 mg  81 mg Oral Daily Jettie Booze, MD      . carvedilol (COREG) tablet 25 mg  25 mg Oral BID WC Bhagat, Bhavinkumar, PA   25 mg at 07/28/16 1716  . cholecalciferol (VITAMIN D) tablet 2,000 Units  2,000 Units Oral Daily Bhagat, Bhavinkumar, PA   2,000 Units at 07/28/16 0942  . doxazosin (CARDURA) tablet 4 mg  4 mg Oral BID Bhagat, Bhavinkumar, PA   4 mg at 07/28/16 0942  . furosemide (LASIX) injection 80 mg  80 mg Intravenous BID Erick Colace, NP   80 mg at 07/28/16 1716  . heparin ADULT infusion 100 units/mL (25000 units/256mL sodium chloride 0.45%)  1,600 Units/hr Intravenous Continuous Kris Mouton, RPH      . hydrALAZINE (APRESOLINE) injection 10-40 mg  10-40 mg Intravenous Q4H PRN Corey Harold, NP   20 mg at 07/25/16 1839  .  insulin aspart (novoLOG) injection 0-9 Units  0-9 Units Subcutaneous TID WC Bhagat, Bhavinkumar, PA   3 Units at 07/28/16 1716  . insulin aspart protamine- aspart (NOVOLOG MIX 70/30) injection 5 Units  5 Units Subcutaneous BID WC Bhagat, Bhavinkumar, PA   5 Units at 07/28/16 1717  . levothyroxine (SYNTHROID, LEVOTHROID) tablet 100 mcg  100 mcg Oral QAC breakfast Bhagat, Bhavinkumar, PA   100 mcg at 07/28/16 0830  . lubiprostone (AMITIZA) capsule 24 mcg  24 mcg Oral BID Bhagat, Bhavinkumar, PA   24 mcg at 07/28/16 0942  . nitroGLYCERIN (NITROSTAT) SL tablet 0.4 mg  0.4 mg Sublingual Q5 Min x 3 PRN Bhagat, Bhavinkumar, PA      . nitroGLYCERIN 50 mg in dextrose 5 % 250 mL (0.2 mg/mL) infusion  0-200 mcg/min Intravenous Titrated Collene Gobble, MD 9 mL/hr at 07/28/16 1700 30 mcg/min at 07/28/16 1700  . ondansetron (ZOFRAN) injection 4 mg  4 mg Intravenous Q6H PRN Jettie Booze, MD      . rosuvastatin (CRESTOR) tablet 10 mg  10 mg Oral QPM Bhagat, Bhavinkumar, PA   10 mg at 07/28/16 1716  . sodium bicarbonate tablet 650 mg  650 mg Oral BID Bhagat, Bhavinkumar, PA   650 mg at 07/28/16 0942  . sodium chloride flush (NS) 0.9 % injection 3 mL  3 mL Intravenous Q12H Jettie Booze, MD   Stopped at 07/28/16 1445  . sodium chloride flush (NS) 0.9 % injection 3 mL  3 mL Intravenous PRN Jettie Booze, MD      . tamsulosin [facility]) capsule 0.8 mg  0.8 mg Oral QPM Bhagat, Bhavinkumar, PA   0.8 mg at 07/28/16 1716    Prescriptions Prior to Admission  Medication Sig Dispense Refill Last Dose  . allopurinol (ZYLOPRIM) 100 MG tablet Take 100 mg by mouth daily.  3 07/24/2016 at Unknown time  . AMITIZA 24 MCG capsule Take 24 mcg by mouth 2 (two) times daily.  5 07/24/2016 at Unknown time  . amLODipine (NORVASC) 10 MG tablet Take 10 mg by mouth daily.  2 07/24/2016 at Unknown time  . betamethasone valerate (VALISONE) 0.1 % cream Apply 1 application topically daily.   99 07/24/2016 at Unknown time  .  BIDIL 20-37.5 MG tablet Take 1 tablet by mouth  2 (two) times daily.  11 07/24/2016 at Unknown time  . carvedilol (COREG) 25 MG tablet Take 25 mg by mouth 2 (two) times daily.  3 07/24/2016 at 600 pm  . CVS VITAMIN D 2000 units CAPS Take 1 capsule by mouth daily.  5 07/24/2016 at Unknown time  . doxazosin (CARDURA) 4 MG tablet Take 4 mg by mouth 2 (two) times daily.  2 07/24/2016 at Unknown time  . fluticasone (FLONASE) 50 MCG/ACT nasal spray USE 2 SPRAYS IN EACH NOSTRIL AT BEDTIME  9 07/24/2016 at Unknown time  . furosemide (LASIX) 80 MG tablet Take 80 mg by mouth daily.  2 07/24/2016 at Unknown time  . HUMULIN 70/30 (70-30) 100 UNIT/ML injection Inject 5-10 Units into the skin 2 (two) times daily with a meal.   9 07/24/2016 at Unknown time  . levothyroxine (SYNTHROID, LEVOTHROID) 100 MCG tablet Take 100 mcg by mouth daily.   07/24/2016 at Unknown time  . rosuvastatin (CRESTOR) 10 MG tablet Take 10 mg by mouth every evening.  3 07/24/2016 at Unknown time  . sodium bicarbonate 650 MG tablet Take 650 mg by mouth 2 (two) times daily.  4 07/24/2016 at Unknown time  . tamsulosin (FLOMAX) 0.4 MG CAPS capsule Take 0.8 mg by mouth every evening.  9 07/24/2016 at Unknown time    Family History  Problem Relation Age of Onset  . Heart disease Mother   . Hypertension Mother   . Hypertension Father      Review of Systems:      Cardiac Review of Systems: Y or N  Chest Pain [ y   ]  Resting SOB [  y ] Exertional SOB  Blue.Reese  ]  Orthopnea [ y ]   Pedal Edema [  y ]    Palpitations [  y] Syncope  [ n ]   Presyncope [  y ]  General Review of Systems: [Y] = yes [  ]=no Constitional: recent weight change [  ]; anorexia [  ]; fatigue [  ]; nausea [  ]; night sweats [  ]; fever [  ]; or chills [  ]                                                               Dental: poor dentition[  ]; Last Dentist visit:   Eye : blurred vision [  ]; diplopia [   ]; vision changes [  ];  Amaurosis fugax[n  ]; Resp: cough [ y ];   wheezing[ y ];  hemoptysis[n  ]; shortness of breath[ h ]; paroxysmal nocturnal dyspnea[h  ]; dyspnea on exertion[h  ]; or orthopnea[  ];  GI:  gallstones[  ], vomiting[  ];  dysphagia[  ]; melena[  ];  hematochezia [  ]; heartburn[  ];   Hx of  Colonoscopy[  ]; GU: kidney stones [  ]; hematuria[  ];   dysuria [  ];  nocturia[  ];  history of     obstruction [  ]; urinary frequency [  ]             Skin: rash, swelling[  ];, hair loss[  ];  peripheral edema[  ];  or itching[  ]; Musculosketetal: myalgias[  ];  joint  swelling[  ];  joint erythema[  ];  joint pain[  ];  back pain[  ];  Heme/Lymph: bruising[  ];  bleeding[  ];  anemia[  ];  Neuro: TIA[  ];  headaches[  ];  stroke[  ];  vertigo[  ];  seizures[  ];   paresthesias[  ];  difficulty walking[n  ];  Psych:depression[  ]; anxiety[  ];  Endocrine: diabetes[ y ];  thyroid dysfunction[  y];  Immunizations: Flu [  ]; Pneumococcal[  ];  Other:  Physical Exam: BP (!) 162/61   Pulse 70   Temp 97.9 F (36.6 C) (Oral)   Resp (!) 26   Ht 6\' 1"  (1.854 m)   Wt 205 lb 14.6 oz (93.4 kg)   SpO2 93%   BMI 27.17 kg/m    General appearance: alert, cooperative, appears older than stated age and no distress Head: Normocephalic, without obvious abnormality, atraumatic Neck: no adenopathy, no JVD, supple, symmetrical, trachea midline, thyroid not enlarged, symmetric, no tenderness/mass/nodules and bilaterial carotid bruits Lymph nodes: Cervical, supraclavicular, and axillary nodes normal. Resp: diminished breath sounds bibasilar Back: symmetric, no curvature. ROM normal. No CVA tenderness. Cardio: regular rate and rhythm, S1, S2 normal, no murmur, click, rub or gallop GI: soft, non-tender; bowel sounds normal; no masses,  no organomegaly Extremities: extremities normal, atraumatic, no cyanosis or edema and Homans sign is negative, no sign of DVT Neurologic: Grossly normal  Diagnostic Studies & Laboratory data:     Recent Radiology Findings:    Dg Chest Port 1 View  Result Date: 07/28/2016 CLINICAL DATA:  Respiratory failure EXAM: PORTABLE CHEST 1 VIEW COMPARISON:  Yesterday FINDINGS: Mild cardiomegaly and vascular pedicle widening. Progressive interstitial opacity with Kerley lines and perihilar airspace opacity. No effusions noted. Artifact from EKG leads. IMPRESSION: CHF pattern that is increased from yesterday. Electronically Signed   By: Monte Fantasia M.D.   On: 07/28/2016 08:55   Dg Chest Port 1 View  Result Date: 07/27/2016 CLINICAL DATA:  Shortness of breath, respiratory failure. History of hypertension, diabetes, CHF, coronary artery disease, chronic renal insufficiency. EXAM: PORTABLE CHEST 1 VIEW COMPARISON:  Portable chest x-ray of July 26, 2016 FINDINGS: The lungs are well-expanded. The interstitial markings have improved significantly. The pulmonary vascularity is less engorged. The heart is top-normal in size. There is calcification in the wall of the aortic arch. The bony thorax exhibits no acute abnormality. IMPRESSION: Decreased pulmonary edema bilaterally consistent with improving CHF. No alveolar pneumonia. Electronically Signed   By: David  Martinique M.D.   On: 07/27/2016 07:56     I have independently reviewed the above radiologic studies.  Recent Lab Findings: Lab Results  Component Value Date   WBC 6.0 07/28/2016   HGB 8.3 (L) 07/28/2016   HCT 26.0 (L) 07/28/2016   PLT 173 07/28/2016   GLUCOSE 219 (H) 07/28/2016   CHOL 99 07/26/2016   TRIG 80 07/26/2016   HDL 38 (L) 07/26/2016   LDLCALC 45 07/26/2016   ALT 11 10/03/2006   AST 15 10/03/2006   NA 135 07/28/2016   K 3.3 (L) 07/28/2016   CL 102 07/28/2016   CREATININE 3.41 (H) 07/28/2016   BUN 71 (H) 07/28/2016   CO2 20 (L) 07/28/2016   TSH 0.565 07/25/2016   INR 1.09 07/28/2016   HGBA1C 6.6 (H) 07/25/2016   Chronic Kidney Disease   Stage I     GFR >90  Stage II    GFR 60-89  Stage IIIA GFR 45-59  Stage  IIIB GFR 30-44  Stage IV   GFR 15-29  Stage  V    GFR  <15  Lab Results  Component Value Date   CREATININE 3.41 (H) 07/28/2016   Estimated Creatinine Clearance: 22.5 mL/min (A) (by C-G formula based on SCr of 3.41 mg/dL (H)).  CATH: Procedures   Left Heart Cath and Coronary Angiography  Conclusion     LM lesion, 75 %stenosed.  Ost Cx lesion, 60 %stenosed.  Ost LAD lesion, 60 %stenosed.  Mid LAD lesion, 90 %stenosed.  Ost 2nd Diag to 2nd Diag lesion, 95 %stenosed.  Mid Cx to Dist Cx lesion, 25 %stenosed.  Prox RCA to Mid RCA lesion, 100 %stenosed.  LV end diastolic pressure is moderately elevated.  There is no aortic valve stenosis.  No ventriculogram done to minimize contrast exposure.   Severe multivessel CAD.  Plan for cardiac surgery consult.  Continue diuresis as renal function allows.     Coronary Diagrams   Diagnostic Diagram       Implants     No implant documentation for this case.  PACS Images   Show images for Cardiac catheterization   Link to Procedure Log   Procedure Log    Hemo Data    Most Recent Value  AO Systolic Pressure 174 mmHg  AO Diastolic Pressure 59 mmHg  AO Mean 96 mmHg  LV Systolic Pressure 944 mmHg  LV Diastolic Pressure 2 mmHg  LV EDP 29 mmHg  Arterial Occlusion Pressure Extended Systolic Pressure 967 mmHg  Arterial Occlusion Pressure Extended Diastolic Pressure 60 mmHg  Arterial Occlusion Pressure Extended Mean Pressure 92 mmHg  Left Ventricular Apex Extended Systolic Pressure 591 mmHg  Left Ventricular Apex Extended Diastolic Pressure 1 mmHg  Left Ventricular Apex Extended EDP Pressure 29 mmHg    I have independently reviewed the above  cath films and reviewed the findings with the  patient .  ECHO: Transthoracic Echocardiography  Patient:    Nabor, Thomann MR #:       [id number] Study Date: 07/25/2016 Gender:     M Age:        71 Height:     185.4 cm Weight:     96.8 kg BSA:        2.25 m^2 Pt. Status: Room:       2H22C   ADMITTING    Lyman Bishop MD  Myrtle MD  Screven MD  PERFORMING   Lyman Bishop MD  REFERRING    Lyman Bishop MD  SONOGRAPHER  [facility]  cc:  ------------------------------------------------------------------- LV EF: 40% -   45%  ------------------------------------------------------------------- Indications:      CHF - 428.0.  ------------------------------------------------------------------- History:   Risk factors:  Hypertension. Diabetes mellitus. Dyslipidemia.  ------------------------------------------------------------------- Study Conclusions  - Left ventricle: The cavity size was normal. There was moderate   concentric hypertrophy. Systolic function was mildly to   moderately reduced. The estimated ejection fraction was in the   range of 40% to 45%. Mid to distal inferior, apical, lateral and   apical septal severe hypokinesis. Doppler parameters are   consistent with abnormal left ventricular relaxation (grade 1   diastolic dysfunction). The E/e&' ratio is >15, suggesting   elevated LV filling pressure. - Aortic valve: Sclerosis without stenosis. There was no   regurgitation. - Mitral valve: Mildly thickened leaflets . There was trivial   regurgitation. - Left atrium: The atrium was normal in size. -  Tricuspid valve: There was mild regurgitation. - Pulmonary arteries: PA peak pressure: 40 mm Hg (S). - Inferior vena cava: The vessel was dilated. The respirophasic   diameter changes were blunted (< 50%), consistent with elevated   central venous pressure.  Impressions:  - Compared to a prior study in 2014, the EF is lower at 40-45% with   regional wall motion abnormalities possibly in the LCX territory   distribution, there is diastolic dysfunction with elevated LV   filling pressure, mild pulmonary hypertension and no pericardial   effusion.  ------------------------------------------------------------------- Study  data:  The previous study was not available, so comparison was made to the report of 12/24/2012.  Study status:  Routine. Procedure:  Transthoracic echocardiography. Image quality was adequate. The study was technically difficult, as a result of poor acoustic windows. Intravenous contrast (Definity) was administered.  Study completion:  There were no complications. Transthoracic echocardiography.  M-mode, complete 2D, spectral Doppler, and color Doppler.  Birthdate:  Patient birthdate: [birth year].  Age:  Patient is 71 yr old.  Sex:  Gender: male. BMI: 28.1 kg/m^2.  Blood pressure:     175/93  Patient status: Inpatient.  Study date:  Study date: 07/25/2016. Study time: 06:55 PM.  Location:  Bedside.  -------------------------------------------------------------------  ------------------------------------------------------------------- Left ventricle:  The cavity size was normal. There was moderate concentric hypertrophy. Systolic function was mildly to moderately reduced. The estimated ejection fraction was in the range of 40% to 45%. Mid to distal inferior, apical, lateral and apical septal severe hypokinesis. Doppler parameters are consistent with abnormal left ventricular relaxation (grade 1 diastolic dysfunction). The E/e&' ratio is >15, suggesting elevated LV filling pressure.  ------------------------------------------------------------------- Aortic valve:  Sclerosis without stenosis.  Doppler:  There was no regurgitation.  ------------------------------------------------------------------- Aorta:  Aortic root: The aortic root was normal in size. Ascending aorta: The ascending aorta was normal in size.  ------------------------------------------------------------------- Mitral valve:   Mildly thickened leaflets .  Doppler:  There was trivial regurgitation.    Valve area by pressure half-time: 5.95 cm^2. Indexed valve area by pressure half-time: 2.65 cm^2/m^2. Peak  gradient (D): 6 mm Hg.  ------------------------------------------------------------------- Left atrium:  The atrium was normal in size.  ------------------------------------------------------------------- Right ventricle:  The cavity size was normal. Wall thickness was normal. The moderator band was prominent. Systolic function was normal.  ------------------------------------------------------------------- Pulmonic valve:   Poorly visualized.  Doppler:  There was no significant regurgitation.  ------------------------------------------------------------------- Tricuspid valve:   Doppler:  There was mild regurgitation.  ------------------------------------------------------------------- Pulmonary artery:   Poorly visualized.  ------------------------------------------------------------------- Right atrium:  The atrium was normal in size.  ------------------------------------------------------------------- Pericardium:  There was no pericardial effusion.  ------------------------------------------------------------------- Systemic veins: Inferior vena cava: The vessel was dilated. The respirophasic diameter changes were blunted (< 50%), consistent with elevated central venous pressure.  ------------------------------------------------------------------- Measurements   Left ventricle                           Value          Reference  LV ID, ED, PLAX chordal                  45    mm       43 - 52  LV ID, ES, PLAX chordal                  30    mm       23 - 38  LV fx shortening, PLAX  chordal           33    %        >=29  LV PW thickness, ED                      13    mm       ----------  IVS/LV PW ratio, ED                      1              <=1.3  Stroke volume, 2D                        65    ml       ----------  Stroke volume/bsa, 2D                    29    ml/m^2   ----------  LV end-diastolic volume, 1-p O7S         202   ml       ----------  LV  end-systolic volume, 1-p J6G          128   ml       ----------  LV end-diastolic volume, 1-p E3M         185   ml       ----------  LV end-systolic volume, 1-p O2H          127   ml       ----------  LV ejection fraction, 1-p A4C            32    %        ----------  Stroke volume, 1-p A4C                   59    ml       ----------  LV end-diastolic volume/bsa, 1-p         82    ml/m^2   ----------  U7M  LV end-systolic volume/bsa, 1-p          56    ml/m^2   ----------  A4C  Stroke volume/bsa, 1-p A4C               26    ml/m^2   ----------  LV end-diastolic volume, 2-p             196   ml       ----------  LV end-systolic volume, 2-p              133   ml       ----------  LV ejection fraction, 2-p                32    %        ----------  Stroke volume, 2-p                       63    ml       ----------  LV end-diastolic volume/bsa, 2-p         87    ml/m^2   ----------  LV end-systolic volume/bsa, 2-p          59    ml/m^2   ----------  Stroke volume/bsa, 2-p  28    ml/m^2   ----------  LV e&', medial                            5.77  cm/s     ----------  LV E/e&', medial                          21.14          ----------    Ventricular septum                       Value          Reference  IVS thickness, ED                        13    mm       ----------    LVOT                                     Value          Reference  LVOT ID, S                               19    mm       ----------  LVOT area                                2.84  cm^2     ----------  LVOT peak velocity, S                    127   cm/s     ----------  LVOT mean velocity, S                    84.4  cm/s     ----------  LVOT VTI, S                              22.8  cm       ----------  LVOT peak gradient, S                    6     mm Hg    ----------    Aorta                                    Value          Reference  Aortic root ID, ED                       35    mm       ----------      Left atrium                              Value          Reference  LA ID, A-P, ES  39    mm       ----------  LA ID/bsa, A-P                           1.73  cm/m^2   <=2.2  LA volume, S                             72.1  ml       ----------  LA volume/bsa, S                         32.1  ml/m^2   ----------  LA volume, ES, 1-p A4C                   59.8  ml       ----------  LA volume/bsa, ES, 1-p A4C               26.6  ml/m^2   ----------  LA volume, ES, 1-p A2C                   86.1  ml       ----------  LA volume/bsa, ES, 1-p A2C               38.3  ml/m^2   ----------    Mitral valve                             Value          Reference  Mitral E-wave peak velocity              122   cm/s     ----------  Mitral deceleration time         (L)     127   ms       150 - 230  Mitral pressure half-time                37    ms       ----------  Mitral peak gradient, D                  6     mm Hg    ----------  Mitral valve area, PHT, DP               5.95  cm^2     ----------  Mitral valve area/bsa, PHT, DP           2.65  cm^2/m^2 ----------    Pulmonary arteries                       Value          Reference  PA pressure, S, DP               (H)     40    mm Hg    <=30    Tricuspid valve                          Value          Reference  Tricuspid regurg peak velocity           304   cm/s     ----------  Tricuspid peak RV-RA gradient  37    mm Hg    ----------    Right atrium                             Value          Reference  RA ID, S-I, ES, A4C                      46.3  mm       34 - 49  RA area, ES, A4C                         13.2  cm^2     8.3 - 19.5  RA volume, ES, A/L                       30.5  ml       ----------  RA volume/bsa, ES, A/L                   13.6  ml/m^2   ----------    Systemic veins                           Value          Reference  Estimated CVP                            3     mm Hg    ----------    Right ventricle                           Value          Reference  TAPSE                                    19.7  mm       ----------  RV pressure, S, DP               (H)     40    mm Hg    <=30  RV s&', lateral, S                        13.1  cm/s     ----------  Legend: (L)  and  (H)  mark values outside specified reference range.  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 I have discussed with the patient risks and options of surgery and with very poor renal function and decreased lv function surgery carries increased risk STS Risk Mortality 5.3 % Morbidity or Mortality  39% Long  length of stay 23% Stroke 3.6 %  I  spent 40 minutes counseling the patient face to face and 50% or more the  time was spent in counseling and coordination of care. The total time spent in the appointment was 60 minutes. [name] MD      [address] [place] [address] Office [phone]   Beeper [phone]  07/28/2016 5:43 PM

## 2016-07-28 NOTE — Progress Notes (Signed)
ANTICOAGULATION CONSULT NOTE   Pharmacy Consult for Heparin Indication: chest pain/ACS  No Known Allergies  Patient Measurements: Height: 6\' 1"  (185.4 cm) Weight: 210 lb 12.2 oz (95.6 kg) IBW/kg (Calculated) : 79.9 Heparin Dosing Weight:  96.8 kg  Vital Signs: Temp: 98.5 F (36.9 C) (06/13 2300) Temp Source: Oral (06/13 2300) BP: 165/64 (06/14 0300) Pulse Rate: 72 (06/14 0300)  Labs:  Recent Labs  07/26/16 0022 07/26/16 0844 07/26/16 1303 07/26/16 1832 07/27/16 0055 07/27/16 1044 07/28/16 0246  HGB 8.7*  --   --   --  8.4*  --  8.3*  HCT 26.7*  --   --   --  25.9*  --  26.0*  PLT 160  --   --   --  160  --  173  HEPARINUNFRC  --  0.43  --   --  0.47  --  <0.10*  CREATININE 3.90*  --   --   --  3.71* 3.67* 3.41*  TROPONINI 13.49*  --  8.27* 7.06* 7.89*  --   --     Estimated Creatinine Clearance: 22.5 mL/min (A) (by C-G formula based on SCr of 3.41 mg/dL (H)).  Assessment: 71 yo male with NSTEMI on heparin. Heparin level down to undetectable after being therapeutic x 2 on 1400 units/hr. Spoke with RN and she turned heparin off at midnight as pt going to cath today - appears this was a misunderstanding - typically heparin runs until on call to cath so she will restart. Plans noted for cath on 6/14.  Goal of Therapy:  Heparin level 0.3-0.7 units/ml Monitor platelets by anticoagulation protocol: Yes   Plan:  Continue heparin 1400 units/hr Will f/u post cath  Sherlon Handing, PharmD, BCPS Clinical pharmacist, pager [phone]  07/28/2016 3:52 AM

## 2016-07-28 NOTE — Progress Notes (Signed)
Patient is on room air at this time with SpO2 od 96%. No acute distress noted. BIPAP not indicated at this time. RT will continue to monitor as needed.

## 2016-07-28 NOTE — Progress Notes (Signed)
[facility] for Heparin Indication: chest pain/ACS  No Known Allergies  Patient Measurements: Height: 6\' 1"  (185.4 cm) Weight: 205 lb 14.6 oz (93.4 kg) IBW/kg (Calculated) : 79.9 Heparin Dosing Weight:  96.8 kg  Vital Signs: Temp: 98.5 F (36.9 C) (06/14 0700) Temp Source: Oral (06/14 0700) BP: 126/92 (06/14 1200) Pulse Rate: 66 (06/14 1200)  Labs:  Recent Labs  07/26/16 0022 07/26/16 0844 07/26/16 1303 07/26/16 1832 07/27/16 0055 07/27/16 1044 07/28/16 0246  HGB 8.7*  --   --   --  8.4*  --  8.3*  HCT 26.7*  --   --   --  25.9*  --  26.0*  PLT 160  --   --   --  160  --  173  LABPROT  --   --   --   --   --   --  14.2  INR  --   --   --   --   --   --  1.09  HEPARINUNFRC  --  0.43  --   --  0.47  --  <0.10*  CREATININE 3.90*  --   --   --  3.71* 3.67* 3.41*  TROPONINI 13.49*  --  8.27* 7.06* 7.89*  --   --     Estimated Creatinine Clearance: 22.5 mL/min (A) (by C-G formula based on SCr of 3.41 mg/dL (H)).   Medical History: Past Medical History:  Diagnosis Date  . Carotid artery occlusion   . Chronic kidney disease   . Diabetes mellitus without complication ([place])   . Hypertension     Assessment: 71 yo male with NSTEMI s/p cath with severe multivessel CAD and plans for cardiac surgery consult. Heparin to restart 8 hrs post sheath removal (removed at 10:40am) -previously on heparin at 1600 units/hr with heparin level= 0.47  Goal of Therapy:  Heparin level 0.3-0.7 units/ml Monitor platelets by anticoagulation protocol: Yes   Plan:  -restart heparin 1600/hr at 7pm -Daily heparin level and CBC  Hildred Laser, Pharm D 07/28/2016 12:03 PM

## 2016-07-28 NOTE — Progress Notes (Signed)
Inpatient Diabetes Program Recommendations  AACE/ADA: New Consensus Statement on Inpatient Glycemic Control (2015)  Target Ranges:  Prepandial:   less than 140 mg/dL      Peak postprandial:   less than 180 mg/dL (1-2 hours)      Critically ill patients:  140 - 180 mg/dL  Results for ELBY, BLACKWELDER (MRN [id number]) as of 07/28/2016 12:45  Ref. Range 07/27/2016 08:23 07/27/2016 11:49 07/27/2016 16:11 07/28/2016 07:50 07/28/2016 11:38  Glucose-Capillary Latest Ref Range: 65 - 99 mg/dL 171 (H) 160 (H) 231 (H) 227 (H) 141 (H)    Review of Glycemic Control  Current orders for Inpatient glycemic control: Novolog 70/30 5 units BID, Novolog 0-9 units TID with meals  Inpatient Diabetes Program Recommendations: Insulin - Basal: Please consider increasing 70/30 to 6 units BID. Correction (SSI): Please consider ordering Novolog 0-5 units QHS for bedtime correction scale.  Thanks, Barnie Alderman, RN, MSN, CDE Diabetes Coordinator Inpatient Diabetes Program [phone] (Team Pager from 8am to 5pm)

## 2016-07-28 NOTE — H&P (View-Only) (Signed)
DAILY PROGRESS NOTE   Patient Name: [name] Date of Encounter: 07/27/2016  Hospital Problem List   Principal Problem:   Acute on chronic combined systolic and diastolic CHF (congestive heart failure) (HCC) Active Problems:   Essential hypertension   BPH with obstruction/lower urinary tract symptoms   AKI (acute kidney injury) ([place])   CKD (chronic kidney disease) stage 4, GFR 15-29 ml/min (HCC)   Elevated troponin   Hypothyroidism   Dyslipidemia   Diabetes mellitus, insulin-dependent (IDDM or type I) ([place])   NSTEMI (non-ST elevated myocardial infarction) [facility])    Chief Complaint   Breathing improved  Subjective   On bipap overnight. Diuresed another 1.5L negative (total 3.7L negative). Troponin trending down. Creatinine improving with diuresis (3.71 yesterday) - BMET pending today. Weight down 4 lbs with diuresis.  Objective   Vitals:   07/27/16 0300 07/27/16 0400 07/27/16 0500 07/27/16 0600  BP: (!) 165/75 (!) 153/48 (!) 154/66 (!) 159/68  Pulse: 73 74 81 80  Resp: _0 (!) 21  Temp:  99.3 F (37.4 C)    TempSrc:  Oral    SpO2: 98% 99% 99% 97%  Weight:   199 lb 1.2 oz (90.3 kg)   Height:        Intake/Output Summary (Last 24 hours) at 07/27/16 0912 Last data filed at 07/27/16 0600  Gross per 24 hour  Intake          1012.23 ml  Output             2465 ml  Net         -1452.77 ml   Filed Weights   07/25/16 1301 07/26/16 0500 07/27/16 0500  Weight: 213 lb 4.8 oz (96.8 kg) 211 lb 6.7 oz (95.9 kg) 199 lb 1.2 oz (90.3 kg)    Physical Exam   General appearance: alert, no distress and sitting up in the chair on nasal cannula Neck: JVD - 1 cm above sternal notch, no carotid bruit and thyroid not enlarged, symmetric, no tenderness/mass/nodules Lungs: diminished breath sounds bibasilar Heart: regular rate and rhythm Abdomen: soft, non-tender; bowel sounds normal; no masses,  no organomegaly Extremities: extremities normal, atraumatic, no cyanosis or  edema Pulses: 2+ and symmetric Skin: Skin color, texture, turgor normal. No rashes or lesions Neurologic: Grossly normal Psych: Pleasant  Inpatient Medications    Scheduled Meds: . allopurinol  100 mg Oral Daily  . amLODipine  10 mg Oral Daily  . aspirin EC  81 mg Oral Daily  . carvedilol  25 mg Oral BID WC  . chlorhexidine  15 mL Mouth Rinse BID  . cholecalciferol  2,000 Units Oral Daily  . doxazosin  4 mg Oral BID  . furosemide  80 mg Intravenous BID  . insulin aspart  0-9 Units Subcutaneous TID WC  . insulin aspart protamine- aspart  5 Units Subcutaneous BID WC  . levothyroxine  100 mcg Oral QAC breakfast  . lubiprostone  24 mcg Oral BID  . mouth rinse  15 mL Mouth Rinse q12n4p  . rosuvastatin  10 mg Oral QPM  . sodium bicarbonate  650 mg Oral BID  . tamsulosin  0.8 mg Oral QPM    Continuous Infusions: . heparin 1,600 Units/hr (07/27/16 0600)  . nitroGLYCERIN 35 mcg/min (07/27/16 0600)    PRN Meds: acetaminophen, hydrALAZINE, nitroGLYCERIN, ondansetron (ZOFRAN) IV   Labs   Results for orders placed or performed during the hospital encounter of 07/25/16 (from the past 48 hour(s))  Glucose,  capillary     Status: Abnormal   Collection Time: 07/25/16 11:13 AM  Result Value Ref Range   Glucose-Capillary 274 (H) 65 - 99 mg/dL  TSH     Status: None   Collection Time: 07/25/16 12:38 PM  Result Value Ref Range   TSH 0.565 0.350 - 4.500 uIU/mL    Comment: Performed by a 3rd Generation assay with a functional sensitivity of <=0.01 uIU/mL.  Troponin I (q 6hr x 3)     Status: Abnormal   Collection Time: 07/25/16 12:38 PM  Result Value Ref Range   Troponin I 6.14 (HH) <0.03 ng/mL    Comment: CRITICAL RESULT CALLED TO, READ BACK BY AND VERIFIED WITH: S.COOPER,RN 1347 07/25/16 CLARK,S   Hemoglobin A1c     Status: Abnormal   Collection Time: 07/25/16 12:38 PM  Result Value Ref Range   Hgb A1c MFr Bld 6.6 (H) 4.8 - 5.6 %    Comment: (NOTE)         Pre-diabetes: 5.7 - 6.4          Diabetes: >6.4         Glycemic control for adults with diabetes: <7.0    Mean Plasma Glucose 143 mg/dL    Comment: (NOTE) Performed At: [facility] [place], [place] [id number] Lindon Romp MD HF:[id number]   Glucose, capillary     Status: Abnormal   Collection Time: 07/25/16  5:30 PM  Result Value Ref Range   Glucose-Capillary 292 (H) 65 - 99 mg/dL   Comment 1 Notify RN   Troponin I (q 6hr x 3)     Status: Abnormal   Collection Time: 07/25/16  6:10 PM  Result Value Ref Range   Troponin I 10.36 (HH) <0.03 ng/mL    Comment: CRITICAL VALUE NOTED.  VALUE IS CONSISTENT WITH PREVIOUSLY REPORTED AND CALLED VALUE.  Heparin level (unfractionated)     Status: Abnormal   Collection Time: 07/25/16  8:12 PM  Result Value Ref Range   Heparin Unfractionated 0.20 (L) 0.30 - 0.70 IU/mL    Comment:        IF HEPARIN RESULTS ARE BELOW EXPECTED VALUES, AND PATIENT DOSAGE HAS BEEN CONFIRMED, SUGGEST FOLLOW UP TESTING OF ANTITHROMBIN III LEVELS.   Glucose, capillary     Status: Abnormal   Collection Time: 07/25/16 10:36 PM  Result Value Ref Range   Glucose-Capillary 173 (H) 65 - 99 mg/dL  Troponin I (q 6hr x 3)     Status: Abnormal   Collection Time: 07/26/16 12:22 AM  Result Value Ref Range   Troponin I 13.49 (HH) <0.03 ng/mL    Comment: CRITICAL VALUE NOTED.  VALUE IS CONSISTENT WITH PREVIOUSLY REPORTED AND CALLED VALUE.  Lipid panel     Status: Abnormal   Collection Time: 07/26/16 12:22 AM  Result Value Ref Range   Cholesterol 99 0 - 200 mg/dL   Triglycerides 80 <150 mg/dL   HDL 38 (L) >40 mg/dL   Total CHOL/HDL Ratio 2.6 RATIO   VLDL 16 0 - 40 mg/dL   LDL Cholesterol 45 0 - 99 mg/dL    Comment:        Total Cholesterol/HDL:CHD Risk Coronary Heart Disease Risk Table                     Men   Women  1/2 Average Risk   3.4   3.3  Average Risk       5.0   4.4  2  X Average Risk   9.6   7.1  3 X Average Risk  23.4   11.0        Use the calculated  Patient Ratio above and the CHD Risk Table to determine the patient's CHD Risk.        ATP III CLASSIFICATION (LDL):  <100     mg/dL   Optimal  100-129  mg/dL   Near or Above                    Optimal  130-159  mg/dL   Borderline  160-189  mg/dL   High  >190     mg/dL   Very High   Basic metabolic panel     Status: Abnormal   Collection Time: 07/26/16 12:22 AM  Result Value Ref Range   Sodium 138 135 - 145 mmol/L   Potassium 3.5 3.5 - 5.1 mmol/L   Chloride 106 101 - 111 mmol/L   CO2 20 (L) 22 - 32 mmol/L   Glucose, Bld 205 (H) 65 - 99 mg/dL   BUN 63 (H) 6 - 20 mg/dL   Creatinine, Ser 3.90 (H) 0.61 - 1.24 mg/dL   Calcium 8.9 8.9 - 10.3 mg/dL   GFR calc non Af Amer 14 (L) >60 mL/min   GFR calc Af Amer 16 (L) >60 mL/min    Comment: (NOTE) The eGFR has been calculated using the CKD EPI equation. This calculation has not been validated in all clinical situations. eGFR's persistently <60 mL/min signify possible Chronic Kidney Disease.    Anion gap 12 5 - 15  CBC     Status: Abnormal   Collection Time: 07/26/16 12:22 AM  Result Value Ref Range   WBC 8.5 4.0 - 10.5 K/uL   RBC 2.98 (L) 4.22 - 5.81 MIL/uL   Hemoglobin 8.7 (L) 13.0 - 17.0 g/dL   HCT 26.7 (L) 39.0 - 52.0 %   MCV 89.6 78.0 - 100.0 fL   MCH 29.2 26.0 - 34.0 pg   MCHC 32.6 30.0 - 36.0 g/dL   RDW 14.3 11.5 - 15.5 %   Platelets 160 150 - 400 K/uL  MRSA PCR Screening     Status: None   Collection Time: 07/26/16  5:07 AM  Result Value Ref Range   MRSA by PCR NEGATIVE NEGATIVE    Comment:        The GeneXpert MRSA Assay (FDA approved for NASAL specimens only), is one component of a comprehensive MRSA colonization surveillance program. It is not intended to diagnose MRSA infection nor to guide or monitor treatment for MRSA infections.   Glucose, capillary     Status: Abnormal   Collection Time: 07/26/16  8:23 AM  Result Value Ref Range   Glucose-Capillary 185 (H) 65 - 99 mg/dL  Heparin level  (unfractionated)     Status: None   Collection Time: 07/26/16  8:44 AM  Result Value Ref Range   Heparin Unfractionated 0.43 0.30 - 0.70 IU/mL    Comment:        IF HEPARIN RESULTS ARE BELOW EXPECTED VALUES, AND PATIENT DOSAGE HAS BEEN CONFIRMED, SUGGEST FOLLOW UP TESTING OF ANTITHROMBIN III LEVELS.   I-STAT 3, arterial blood gas (G3+)     Status: None   Collection Time: 07/26/16  8:49 AM  Result Value Ref Range   pH, Arterial 7.432 7.350 - 7.450   pCO2 arterial 35.0 32.0 - 48.0 mmHg   pO2, Arterial 84.0 83.0 - 108.0 mmHg  Bicarbonate 23.3 20.0 - 28.0 mmol/L   TCO2 24 0 - 100 mmol/L   O2 Saturation 97.0 %   Acid-base deficit 1.0 0.0 - 2.0 mmol/L   Patient temperature 98.6 F    Collection site RADIAL, ALLEN'S TEST ACCEPTABLE    Drawn by RT    Sample type ARTERIAL   Glucose, capillary     Status: Abnormal   Collection Time: 07/26/16 12:24 PM  Result Value Ref Range   Glucose-Capillary 174 (H) 65 - 99 mg/dL   Comment 1 Notify RN   Troponin I     Status: Abnormal   Collection Time: 07/26/16  1:03 PM  Result Value Ref Range   Troponin I 8.27 (HH) <0.03 ng/mL    Comment: CRITICAL VALUE NOTED.  VALUE IS CONSISTENT WITH PREVIOUSLY REPORTED AND CALLED VALUE.  Glucose, capillary     Status: Abnormal   Collection Time: 07/26/16  4:43 PM  Result Value Ref Range   Glucose-Capillary 138 (H) 65 - 99 mg/dL   Comment 1 Notify RN   Troponin I     Status: Abnormal   Collection Time: 07/26/16  6:32 PM  Result Value Ref Range   Troponin I 7.06 (HH) <0.03 ng/mL    Comment: CRITICAL VALUE NOTED.  VALUE IS CONSISTENT WITH PREVIOUSLY REPORTED AND CALLED VALUE.  Glucose, capillary     Status: Abnormal   Collection Time: 07/26/16 10:14 PM  Result Value Ref Range   Glucose-Capillary 136 (H) 65 - 99 mg/dL   Comment 1 Capillary Specimen   Troponin I     Status: Abnormal   Collection Time: 07/27/16 12:55 AM  Result Value Ref Range   Troponin I 7.89 (HH) <0.03 ng/mL    Comment: CRITICAL VALUE  NOTED.  VALUE IS CONSISTENT WITH PREVIOUSLY REPORTED AND CALLED VALUE.  CBC     Status: Abnormal   Collection Time: 07/27/16 12:55 AM  Result Value Ref Range   WBC 7.0 4.0 - 10.5 K/uL   RBC 2.86 (L) 4.22 - 5.81 MIL/uL   Hemoglobin 8.4 (L) 13.0 - 17.0 g/dL   HCT 25.9 (L) 39.0 - 52.0 %   MCV 90.6 78.0 - 100.0 fL   MCH 29.4 26.0 - 34.0 pg   MCHC 32.4 30.0 - 36.0 g/dL   RDW 14.3 11.5 - 15.5 %   Platelets 160 150 - 400 K/uL  Heparin level (unfractionated)     Status: None   Collection Time: 07/27/16 12:55 AM  Result Value Ref Range   Heparin Unfractionated 0.47 0.30 - 0.70 IU/mL    Comment:        IF HEPARIN RESULTS ARE BELOW EXPECTED VALUES, AND PATIENT DOSAGE HAS BEEN CONFIRMED, SUGGEST FOLLOW UP TESTING OF ANTITHROMBIN III LEVELS.   Basic metabolic panel     Status: Abnormal   Collection Time: 07/27/16 12:55 AM  Result Value Ref Range   Sodium 137 135 - 145 mmol/L   Potassium 3.1 (L) 3.5 - 5.1 mmol/L   Chloride 103 101 - 111 mmol/L   CO2 22 22 - 32 mmol/L   Glucose, Bld 145 (H) 65 - 99 mg/dL   BUN 65 (H) 6 - 20 mg/dL   Creatinine, Ser 3.71 (H) 0.61 - 1.24 mg/dL   Calcium 8.8 (L) 8.9 - 10.3 mg/dL   GFR calc non Af Amer 15 (L) >60 mL/min   GFR calc Af Amer 17 (L) >60 mL/min    Comment: (NOTE) The eGFR has been calculated using the CKD EPI equation. This calculation  has not been validated in all clinical situations. eGFR's persistently <60 mL/min signify possible Chronic Kidney Disease.    Anion gap 12 5 - 15  Magnesium     Status: None   Collection Time: 07/27/16 12:55 AM  Result Value Ref Range   Magnesium 2.1 1.7 - 2.4 mg/dL  Phosphorus     Status: None   Collection Time: 07/27/16 12:55 AM  Result Value Ref Range   Phosphorus 3.2 2.5 - 4.6 mg/dL  Glucose, capillary     Status: Abnormal   Collection Time: 07/27/16  8:23 AM  Result Value Ref Range   Glucose-Capillary 171 (H) 65 - 99 mg/dL    ECG   None today  Telemetry   NSR in 80's - Personally  Reviewed  Radiology    Dg Chest Port 1 View  Result Date: 07/27/2016 CLINICAL DATA:  Shortness of breath, respiratory failure. History of hypertension, diabetes, CHF, coronary artery disease, chronic renal insufficiency. EXAM: PORTABLE CHEST 1 VIEW COMPARISON:  Portable chest x-ray of July 26, 2016 FINDINGS: The lungs are well-expanded. The interstitial markings have improved significantly. The pulmonary vascularity is less engorged. The heart is top-normal in size. There is calcification in the wall of the aortic arch. The bony thorax exhibits no acute abnormality. IMPRESSION: Decreased pulmonary edema bilaterally consistent with improving CHF. No alveolar pneumonia. Electronically Signed   By: David  Martinique M.D.   On: 07/27/2016 07:56   Dg Chest Port 1 View  Result Date: 07/26/2016 CLINICAL DATA:  Pt states he came in with a heart attack Saturday, hx htn, diabetes, past smoker is on a bipap, pt states no chest complaints or sob, he thinks her feels fine, RN states r/o pneumonia EXAM: PORTABLE CHEST 1 VIEW COMPARISON:  07/25/2016; 10/03/2006; 07/18/2003 FINDINGS: Grossly unchanged enlarged cardiac silhouette and mediastinal contours with atherosclerotic plaque within the thoracic aorta. Pulmonary vasculature remains indistinct with cephalization of flow. Overall improved aeration of the lungs with persistent perihilar heterogeneous airspace opacities. No definite pleural effusion, though note, the bilateral costophrenic angles excluded from view. No pneumothorax. No acute osseus abnormalities. IMPRESSION: Findings most suggestive of improved perihilar alveolar pulmonary edema. Continued attention on follow-up is recommended. Electronically Signed   By: Sandi Mariscal M.D.   On: 07/26/2016 09:00   Dg Chest Port 1 View  Result Date: 07/25/2016 CLINICAL DATA:  Admitted yesterday for respiratory distress. EXAM: PORTABLE CHEST 1 VIEW COMPARISON:  07/25/2016 at 7:33 a.m. and 10/03/2006 FINDINGS: Lungs are  adequately inflated demonstrate persistent moderate bilateral perihilar airspace opacification unchanged to slightly worse. No definite effusion or pneumothorax. Stable cardiomegaly. Mild calcified plaque over the aortic arch. IMPRESSION: Moderate bilateral perihilar opacification unchanged to slightly worse. Findings may be due to significant interstitial edema versus infection. Stable cardiomegaly. Aortic atherosclerosis. Electronically Signed   By: Marin Olp M.D.   On: 07/25/2016 15:55    Cardiac Studies   LV EF: 40% -   45%  ------------------------------------------------------------------- Indications:      CHF - 428.0.  ------------------------------------------------------------------- History:   Risk factors:  Hypertension. Diabetes mellitus. Dyslipidemia.  ------------------------------------------------------------------- Study Conclusions  - Left ventricle: The cavity size was normal. There was moderate   concentric hypertrophy. Systolic function was mildly to   moderately reduced. The estimated ejection fraction was in the   range of 40% to 45%. Mid to distal inferior, apical, lateral and   apical septal severe hypokinesis. Doppler parameters are   consistent with abnormal left ventricular relaxation (grade 1   diastolic dysfunction). The  E/e&' ratio is >15, suggesting   elevated LV filling pressure. - Aortic valve: Sclerosis without stenosis. There was no   regurgitation. - Mitral valve: Mildly thickened leaflets . There was trivial   regurgitation. - Left atrium: The atrium was normal in size. - Tricuspid valve: There was mild regurgitation. - Pulmonary arteries: PA peak pressure: 40 mm Hg (S). - Inferior vena cava: The vessel was dilated. The respirophasic   diameter changes were blunted (< 50%), consistent with elevated   central venous pressure.  Impressions:  - Compared to a prior study in 2014, the EF is lower at 40-45% with   regional wall motion  abnormalities possibly in the LCX territory   distribution, there is diastolic dysfunction with elevated LV   filling pressure, mild pulmonary hypertension and no pericardial   effusion.  Assessment   Principal Problem:   Acute on chronic combined systolic and diastolic CHF (congestive heart failure) (HCC) Active Problems:   Essential hypertension   BPH with obstruction/lower urinary tract symptoms   AKI (acute kidney injury) (HCC)   CKD (chronic kidney disease) stage 4, GFR 15-29 ml/min (HCC)   Elevated troponin   Hypothyroidism   Dyslipidemia   Diabetes mellitus, insulin-dependent (IDDM or type I) (HCC)   NSTEMI (non-ST elevated myocardial infarction) [facility])   Plan   [name] continues to improve with diuresis - creatinine has improved. Today's values are pending. Ok to start diet today since he is off bipap. Continue IV diuresis - plan for tentative left heart catheterization tomorrow at 12 pm with Dr. Irish Lack - with possible percutaneous intervention for NSTEMI. Creatinine was 2.65 in 2015 (sees Dr. Hinda Lenis with nephrology). Continue IV Heparin and medical therapy for NSTEMI. [place] for clear liquids tomorrow at breakfast then NPO afterward.  Time Spent Directly with Patient:  25 minutes  Length of Stay:  LOS: 2 days   Pixie Casino, MD, [place]  Attending Cardiologist  Direct Dial: [phone]  Fax: [phone]  Website:  [email or web address]  Nadean Corwin Hilty 07/27/2016, 9:12 AM

## 2016-07-28 NOTE — Interval H&P Note (Signed)
Cath Lab Visit (complete for each Cath Lab visit)  Clinical Evaluation Leading to the Procedure:   ACS: Yes.    Non-ACS:    Anginal Classification: CCS IV  Anti-ischemic medical therapy: Minimal Therapy (1 class of medications)  Non-Invasive Test Results: No non-invasive testing performed  Prior CABG: No previous CABG      History and Physical Interval Note:  07/28/2016 10:11 AM  [name]  has presented today for surgery, with the diagnosis of nstemi  The various methods of treatment have been discussed with the patient and family. After consideration of risks, benefits and other options for treatment, the patient has consented to  Procedure(s): Left Heart Cath and Coronary Angiography (N/A) as a surgical intervention .  The patient's history has been reviewed, patient examined, no change in status, stable for surgery.  I have reviewed the patient's chart and labs.  Questions were answered to the patient's satisfaction.     Larae Grooms

## 2016-07-28 NOTE — Progress Notes (Addendum)
Cath films reviewed and discussed with Dr. Irish Lack - there is multivessel CAD. CABG evaluation is recommended and has been placed. Appreciate TCTS recommendations. May need nephrology evaluation as well given his CKD, to help determine risk of progression to dialysis with surgery. Replete potassium (3.3 this am) - check magnesium and repeat BMET in am tomorrow.  Pixie Casino, MD, [place]  Attending Cardiologist  Direct Dial: [phone]  Fax: [phone]  Website:  [email or web address]

## 2016-07-29 ENCOUNTER — Inpatient Hospital Stay (HOSPITAL_COMMUNITY): Payer: Medicare Other

## 2016-07-29 DIAGNOSIS — Z01810 Encounter for preprocedural cardiovascular examination: Secondary | ICD-10-CM

## 2016-07-29 LAB — CBC
HCT: 25.8 % — ABNORMAL LOW (ref 39.0–52.0)
HEMOGLOBIN: 8.6 g/dL — AB (ref 13.0–17.0)
MCH: 29.4 pg (ref 26.0–34.0)
MCHC: 33.3 g/dL (ref 30.0–36.0)
MCV: 88.1 fL (ref 78.0–100.0)
PLATELETS: 183 10*3/uL (ref 150–400)
RBC: 2.93 MIL/uL — ABNORMAL LOW (ref 4.22–5.81)
RDW: 13.6 % (ref 11.5–15.5)
WBC: 5.3 10*3/uL (ref 4.0–10.5)

## 2016-07-29 LAB — MAGNESIUM: Magnesium: 1.9 mg/dL (ref 1.7–2.4)

## 2016-07-29 LAB — GLUCOSE, CAPILLARY
GLUCOSE-CAPILLARY: 236 mg/dL — AB (ref 65–99)
GLUCOSE-CAPILLARY: 173 mg/dL — AB (ref 65–99)
Glucose-Capillary: 212 mg/dL — ABNORMAL HIGH (ref 65–99)
Glucose-Capillary: 237 mg/dL — ABNORMAL HIGH (ref 65–99)

## 2016-07-29 LAB — VAS US DOPPLER PRE CABG
LEFT ECA DIAS: 0 cm/s
LEFT VERTEBRAL DIAS: 21 cm/s
Left CCA dist dias: -10 cm/s
Left CCA dist sys: -155 cm/s
Left CCA prox dias: 9 cm/s
Left CCA prox sys: 98 cm/s
Left ICA dist dias: -10 cm/s
Left ICA dist sys: -69 cm/s
RIGHT ECA DIAS: 0 cm/s
RIGHT VERTEBRAL DIAS: -7 cm/s
Right CCA prox dias: 13 cm/s
Right CCA prox sys: 143 cm/s
Right cca dist sys: -80 cm/s

## 2016-07-29 LAB — BASIC METABOLIC PANEL
Anion gap: 11 (ref 5–15)
BUN: 62 mg/dL — ABNORMAL HIGH (ref 6–20)
CO2: 23 mmol/L (ref 22–32)
Calcium: 9 mg/dL (ref 8.9–10.3)
Chloride: 100 mmol/L — ABNORMAL LOW (ref 101–111)
Creatinine, Ser: 3.23 mg/dL — ABNORMAL HIGH (ref 0.61–1.24)
GFR calc Af Amer: 21 mL/min — ABNORMAL LOW (ref >60)
GFR, EST NON AFRICAN AMERICAN: 18 mL/min — AB (ref >60)
GLUCOSE: 204 mg/dL — AB (ref 65–99)
POTASSIUM: 3.2 mmol/L — AB (ref 3.5–5.1)
Sodium: 134 mmol/L — ABNORMAL LOW (ref 135–145)

## 2016-07-29 LAB — HEPARIN LEVEL (UNFRACTIONATED)
HEPARIN UNFRACTIONATED: 0.29 [IU]/mL — AB (ref 0.30–0.70)
HEPARIN UNFRACTIONATED: 0.42 [IU]/mL (ref 0.30–0.70)

## 2016-07-29 MED ORDER — NITROGLYCERIN IN D5W 200-5 MCG/ML-% IV SOLN
0.0000 ug/min | INTRAVENOUS | Status: DC
  Administered 2016-07-29: 35 ug/min via INTRAVENOUS
  Administered 2016-07-29: 30 ug/min via INTRAVENOUS
  Administered 2016-07-29: 35 ug/min via INTRAVENOUS
  Filled 2016-07-29: qty 250

## 2016-07-29 MED ORDER — HEPARIN (PORCINE) IN NACL 100-0.45 UNIT/ML-% IJ SOLN
1300.0000 [IU]/h | INTRAMUSCULAR | Status: DC
  Administered 2016-07-29 (×2): 1750 [IU]/h via INTRAVENOUS
  Administered 2016-07-30 – 2016-08-03 (×4): 1400 [IU]/h via INTRAVENOUS
  Administered 2016-08-04 (×2): 1300 [IU]/h via INTRAVENOUS
  Filled 2016-07-29 (×9): qty 250

## 2016-07-29 MED ORDER — ISOSORB DINITRATE-HYDRALAZINE 20-37.5 MG PO TABS
1.0000 | ORAL_TABLET | Freq: Three times a day (TID) | ORAL | Status: DC
  Administered 2016-07-29 (×3): 1 via ORAL
  Filled 2016-07-29 (×4): qty 1

## 2016-07-29 NOTE — Progress Notes (Signed)
ANTICOAGULATION CONSULT NOTE   Pharmacy Consult for Heparin Indication: chest pain/ACS  No Known Allergies  Patient Measurements: Height: 6\' 1"  (185.4 cm) Weight: 205 lb 14.6 oz (93.4 kg) IBW/kg (Calculated) : 79.9 Heparin Dosing Weight:  96.8 kg  Vital Signs: Temp: 98.8 F (37.1 C) (06/15 0318) Temp Source: Oral (06/15 0318) BP: 165/70 (06/15 0318) Pulse Rate: 72 (06/15 0318)  Labs:  Recent Labs  07/26/16 1303 07/26/16 1832  07/27/16 0055 07/27/16 1044 07/28/16 0246 07/29/16 0318  HGB  --   --   < > 8.4*  --  8.3* 8.6*  HCT  --   --   --  25.9*  --  26.0* 25.8*  PLT  --   --   --  160  --  173 183  LABPROT  --   --   --   --   --  14.2  --   INR  --   --   --   --   --  1.09  --   HEPARINUNFRC  --   --   --  0.47  --  <0.10* 0.29*  CREATININE  --   --   --  3.71* 3.67* 3.41*  --   TROPONINI 8.27* 7.06*  --  7.89*  --   --   --   < > = values in this interval not displayed.  Estimated Creatinine Clearance: 22.5 mL/min (A) (by C-G formula based on SCr of 3.41 mg/dL (H)).  Assessment: 71 yo male with NSTEMI s/p cath with severe multivessel CAD and plans for cardiac surgery consult. Heparin restarted post cath. Heparin level slightly subtherapeutic (0.29) on gtt at 1600 units/hr. CBC stable. No issues with line or bleeding reported per RN.  Goal of Therapy:  Heparin level 0.3-0.7 units/ml Monitor platelets by anticoagulation protocol: Yes   Plan:  Increase heparin to 1750 units/hr Will f/u 8 hr heparin level  Sherlon Handing, PharmD, BCPS Clinical pharmacist, pager [phone]  07/29/2016 3:58 AM

## 2016-07-29 NOTE — Progress Notes (Signed)
PT Cancellation Note  Patient Details Name: [name] MRN: [id number] DOB: [birth year]   Cancelled Treatment:    Reason Eval/Treat Not Completed: Medical issues which prohibited therapy. Nsg reports pt had chest pain when amb to bathroom earlier.   Shary Decamp Maycok 07/29/2016, 8:55 AM Suanne Marker PT [phone]

## 2016-07-29 NOTE — Progress Notes (Signed)
Report given to [facility] on 3W at this time.  Pt remains pain free.  No s/s of any acute distress.

## 2016-07-29 NOTE — Progress Notes (Signed)
Arrived to unit in stable condition transferred from [facility].  Nitro gtt titrated to 48mcg/min for SBP>170 upon arrival.  Heparin gtt infusing without difficulty.  Pt denies any CP or discomfort.  Educated on importance of notifying nursing staff if CP arises; understanding verbalized.  Educated on room and unit.  Will continue to monitor.

## 2016-07-29 NOTE — Progress Notes (Signed)
Inpatient Diabetes Program Recommendations  AACE/ADA: New Consensus Statement on Inpatient Glycemic Control (2015)  Target Ranges:  Prepandial:   less than 140 mg/dL      Peak postprandial:   less than 180 mg/dL (1-2 hours)      Critically ill patients:  140 - 180 mg/dL   Lab Results  Component Value Date   GLUCAP 236 (H) 07/29/2016   HGBA1C 6.6 (H) 07/25/2016    Review of Glycemic Control:  Results for RAMIRO, PANGILINAN (MRN [id number]) as of 07/29/2016 10:31  Ref. Range 07/28/2016 07:50 07/28/2016 11:38 07/28/2016 16:07 07/28/2016 23:22 07/29/2016 07:45  Glucose-Capillary Latest Ref Range: 65 - 99 mg/dL 227 (H) 141 (H) 223 (H) 267 (H) 236 (H)   Diabetes history: Type 2 diabetes Outpatient Diabetes medications: 70/30 5-10 units bid Current orders for Inpatient glycemic control:  Novolog sensitive tid with meals, Novolog 70/30 5 units bid Inpatient Diabetes Program Recommendations:  Consider d/c of Novolog 70/30 while in the hospital.  May consider adding Lantus 10 units daily.  Also consider adding Novolog meal coverage 3 units tid with meals-Hold if patient eats less than 50%.   Thanks, Adah Perl, RN, BC-ADM Inpatient Diabetes Coordinator Pager [phone] (8a-5p)

## 2016-07-29 NOTE — Progress Notes (Signed)
ANTICOAGULATION CONSULT NOTE   Pharmacy Consult for Heparin Indication: chest pain/ACS  No Known Allergies  Patient Measurements: Height: 6\' 1"  (185.4 cm) Weight: 204 lb 2.3 oz (92.6 kg) IBW/kg (Calculated) : 79.9 Heparin Dosing Weight:  96.8 kg  Vital Signs: Temp: 98.3 F (36.8 C) (06/15 1200) Temp Source: Oral (06/15 1200) BP: 163/69 (06/15 1200) Pulse Rate: 67 (06/15 1300)  Labs:  Recent Labs  07/26/16 1832  07/27/16 0055 07/27/16 1044 07/28/16 0246 07/29/16 0318 07/29/16 1146  HGB  --   < > 8.4*  --  8.3* 8.6*  --   HCT  --   --  25.9*  --  26.0* 25.8*  --   PLT  --   --  160  --  173 183  --   LABPROT  --   --   --   --  14.2  --   --   INR  --   --   --   --  1.09  --   --   HEPARINUNFRC  --   < > 0.47  --  <0.10* 0.29* 0.42  CREATININE  --   < > 3.71* 3.67* 3.41* 3.23*  --   TROPONINI 7.06*  --  7.89*  --   --   --   --   < > = values in this interval not displayed.  Estimated Creatinine Clearance: 23.7 mL/min (A) (by C-G formula based on SCr of 3.23 mg/dL (H)).  Assessment: 71 yo male with NSTEMI s/p cath with severe multivessel CAD and plans for CABG next week.  -Heparin level is at goal on 1750 units/hr Goal of Therapy:  Heparin level 0.3-0.7 units/ml Monitor platelets by anticoagulation protocol: Yes   Plan:  -No heparin changes needed -Daily heparin level and CBC  Hildred Laser, Pharm D 07/29/2016 1:08 PM

## 2016-07-29 NOTE — Progress Notes (Signed)
[id number]:  After weaning ntg gtt to 42mcg/min, pt began to c/o CP rated 7/10.  Notified Dr. Debara Pickett and he ordered to continue gtt and change pt's transfer to [place].  Increased gtt to 62mcg and within a few minutes, pt felt relief and now reports pain to be 0/10.  No other s/s of any acute distress.

## 2016-07-29 NOTE — Progress Notes (Signed)
PT Cancellation Note  Patient Details Name: [name] MRN: [id number] DOB: January 31, 1946   Cancelled Treatment:    Reason Eval/Treat Not Completed: Medical issues which prohibited therapy. Another episode of chest pain.   O[place] 07/29/2016, 4:11 PM  [place]

## 2016-07-29 NOTE — Progress Notes (Signed)
Pre-op Cardiac Surgery  Carotid Findings:   Waveforms appear high resistive in the right and left internal carotid artery. Diastolic velocities are within normal limits, while systolic velocities are elevated of an unknown etiology. Elevated velocities could be attributed to vessel tortuosity. Percentage of stenosis is difficult to classify, may need additional testing to determine degree of stenosis.   Upper Extremity Right Left  Brachial Pressures 171  Triphasic 185  Triphasic  Radial Waveforms Triphasic Triphasic  Ulnar Waveforms Triphasic Triphasic  Palmar Arch (Allen's Test) Palmar waveforms remain within normal limits with radial compression and are obliterated with ulnar compression. Palmar waveforms are decreased greater than fifty percent with radial compression and remain within normal limits with ulnar compression.    Lower  Extremity Right Left  Dorsalis Pedis 103  Monophasic 85  Monophasic  Posterior Tibial 104  Monophasic 97  Monophasic  Ankle/Brachial Indices 0.54 0.51   Findings:   Right ABI of 0.54 and left ABI of 0.51 are suggestive of moderate arterial occlusive disease at rest.

## 2016-07-29 NOTE — Progress Notes (Signed)
CARDIAC REHAB PHASE I   Pt in bed, reports he had chest pain ambulating to the bathroom, will hold ambulation for now. Completed cardiac surgery pre-op education. Reviewed IS, sternal precautions, activity progression, cardiac surgery booklet and cardiac surgery guidelines. Pt verbalized understanding, declined cardiac surgery videos. Pt in bed, call bell within reach. Will follow.   [place], RN, BSN 07/29/2016 10:19 AM

## 2016-07-29 NOTE — Progress Notes (Signed)
DAILY PROGRESS NOTE   Patient Name: [name] Date of Encounter: 07/29/2016  Hospital Problem List   Principal Problem:   Acute on chronic combined systolic and diastolic CHF (congestive heart failure) (HCC) Active Problems:   Essential hypertension   BPH with obstruction/lower urinary tract symptoms   AKI (acute kidney injury) ([place])   CKD (chronic kidney disease) stage 4, GFR 15-29 ml/min (HCC)   Elevated troponin   Hypothyroidism   Dyslipidemia   Diabetes mellitus, insulin-dependent (IDDM or type I) ([place])   NSTEMI (non-ST elevated myocardial infarction) ([place])   Acute pulmonary edema (HCC)   Hypoxemia   Acute respiratory failure with hypoxia [facility])    Chief Complaint   Breathing improved, no further chest pain  Subjective   Appreciate TCTS recommendations - possible CABG on 6/22 with Dr. Servando Snare. Will need stabilization of renal function and additional diuresis. Diuresed another 1L negative overnight - creatinine continues to improve (down to 3.23 today - from 3.41) may be nearing baseline.  Objective   Vitals:   07/29/16 0800 07/29/16 0815 07/29/16 0900 07/29/16 1000  BP: (!) 170/91  (!) 176/70 (!) 166/76  Pulse: (!) 58  67 77  Resp: (!) 22 (!) 24 (!) 22 (!) 22  Temp:      TempSrc:      SpO2: 100%  97% 97%  Weight:      Height:        Intake/Output Summary (Last 24 hours) at 07/29/16 1101 Last data filed at 07/29/16 1000  Gross per 24 hour  Intake          1269.14 ml  Output             1775 ml  Net          -505.86 ml   Filed Weights   07/28/16 0051 07/28/16 0500 07/29/16 0421  Weight: 210 lb 12.2 oz (95.6 kg) 205 lb 14.6 oz (93.4 kg) 204 lb 2.3 oz (92.6 kg)    Physical Exam   General appearance: alert and no distress Neck: no carotid bruit and no JVD Lungs: diminished breath sounds bibasilar and RLL and rales RLL Heart: regular rate and rhythm Abdomen: soft, non-tender; bowel sounds normal; no masses,  no organomegaly Extremities:  extremities normal, atraumatic, no cyanosis or edema Pulses: 2+ and symmetric Skin: Skin color, texture, turgor normal. No rashes or lesions Neurologic: Grossly normal Psych: Pleasant  Inpatient Medications    Scheduled Meds: . allopurinol  100 mg Oral Daily  . amLODipine  10 mg Oral Daily  . aspirin  81 mg Oral Daily  . carvedilol  25 mg Oral BID WC  . cholecalciferol  2,000 Units Oral Daily  . doxazosin  4 mg Oral BID  . furosemide  80 mg Intravenous BID  . insulin aspart  0-9 Units Subcutaneous TID WC  . insulin aspart protamine- aspart  5 Units Subcutaneous BID WC  . levothyroxine  100 mcg Oral QAC breakfast  . lubiprostone  24 mcg Oral BID  . rosuvastatin  10 mg Oral QPM  . sodium bicarbonate  650 mg Oral BID  . sodium chloride flush  3 mL Intravenous Q12H  . tamsulosin  0.8 mg Oral QPM    Continuous Infusions: . sodium chloride    . heparin 1,750 Units/hr (07/29/16 1000)  . nitroGLYCERIN 45 mcg/min (07/29/16 1000)    PRN Meds: sodium chloride, acetaminophen, hydrALAZINE, nitroGLYCERIN, ondansetron (ZOFRAN) IV, sodium chloride flush   Labs   Results for orders  placed or performed during the hospital encounter of 07/25/16 (from the past 48 hour(s))  Glucose, capillary     Status: Abnormal   Collection Time: 07/27/16 11:49 AM  Result Value Ref Range   Glucose-Capillary 160 (H) 65 - 99 mg/dL  Glucose, capillary     Status: Abnormal   Collection Time: 07/27/16  4:11 PM  Result Value Ref Range   Glucose-Capillary 231 (H) 65 - 99 mg/dL   Comment 1 Capillary Specimen   CBC     Status: Abnormal   Collection Time: 07/28/16  2:46 AM  Result Value Ref Range   WBC 6.0 4.0 - 10.5 K/uL   RBC 2.90 (L) 4.22 - 5.81 MIL/uL   Hemoglobin 8.3 (L) 13.0 - 17.0 g/dL   HCT 26.0 (L) 39.0 - 52.0 %   MCV 89.7 78.0 - 100.0 fL   MCH 28.6 26.0 - 34.0 pg   MCHC 31.9 30.0 - 36.0 g/dL   RDW 13.9 11.5 - 15.5 %   Platelets 173 150 - 400 K/uL  Heparin level (unfractionated)     Status:  Abnormal   Collection Time: 07/28/16  2:46 AM  Result Value Ref Range   Heparin Unfractionated <0.10 (L) 0.30 - 0.70 IU/mL    Comment:        IF HEPARIN RESULTS ARE BELOW EXPECTED VALUES, AND PATIENT DOSAGE HAS BEEN CONFIRMED, SUGGEST FOLLOW UP TESTING OF ANTITHROMBIN III LEVELS. REPEATED TO VERIFY   Basic metabolic panel     Status: Abnormal   Collection Time: 07/28/16  2:46 AM  Result Value Ref Range   Sodium 135 135 - 145 mmol/L   Potassium 3.3 (L) 3.5 - 5.1 mmol/L   Chloride 102 101 - 111 mmol/L   CO2 20 (L) 22 - 32 mmol/L   Glucose, Bld 219 (H) 65 - 99 mg/dL   BUN 71 (H) 6 - 20 mg/dL   Creatinine, Ser 3.41 (H) 0.61 - 1.24 mg/dL   Calcium 8.9 8.9 - 10.3 mg/dL   GFR calc non Af Amer 17 (L) >60 mL/min   GFR calc Af Amer 19 (L) >60 mL/min    Comment: (NOTE) The eGFR has been calculated using the CKD EPI equation. This calculation has not been validated in all clinical situations. eGFR's persistently <60 mL/min signify possible Chronic Kidney Disease.    Anion gap 13 5 - 15  Protime-INR     Status: None   Collection Time: 07/28/16  2:46 AM  Result Value Ref Range   Prothrombin Time 14.2 11.4 - 15.2 seconds   INR 1.09   Glucose, capillary     Status: Abnormal   Collection Time: 07/28/16  7:50 AM  Result Value Ref Range   Glucose-Capillary 227 (H) 65 - 99 mg/dL   Comment 1 Notify RN   Glucose, capillary     Status: Abnormal   Collection Time: 07/28/16 11:38 AM  Result Value Ref Range   Glucose-Capillary 141 (H) 65 - 99 mg/dL   Comment 1 Notify RN   Glucose, capillary     Status: Abnormal   Collection Time: 07/28/16  4:07 PM  Result Value Ref Range   Glucose-Capillary 223 (H) 65 - 99 mg/dL   Comment 1 Capillary Specimen    Comment 2 Notify RN   Glucose, capillary     Status: Abnormal   Collection Time: 07/28/16 11:22 PM  Result Value Ref Range   Glucose-Capillary 267 (H) 65 - 99 mg/dL   Comment 1 Capillary Specimen   CBC  Status: Abnormal   Collection Time:  07/29/16  3:18 AM  Result Value Ref Range   WBC 5.3 4.0 - 10.5 K/uL   RBC 2.93 (L) 4.22 - 5.81 MIL/uL   Hemoglobin 8.6 (L) 13.0 - 17.0 g/dL   HCT 25.8 (L) 39.0 - 52.0 %   MCV 88.1 78.0 - 100.0 fL   MCH 29.4 26.0 - 34.0 pg   MCHC 33.3 30.0 - 36.0 g/dL   RDW 13.6 11.5 - 15.5 %   Platelets 183 150 - 400 K/uL  Heparin level (unfractionated)     Status: Abnormal   Collection Time: 07/29/16  3:18 AM  Result Value Ref Range   Heparin Unfractionated 0.29 (L) 0.30 - 0.70 IU/mL    Comment:        IF HEPARIN RESULTS ARE BELOW EXPECTED VALUES, AND PATIENT DOSAGE HAS BEEN CONFIRMED, SUGGEST FOLLOW UP TESTING OF ANTITHROMBIN III LEVELS.   Basic metabolic panel     Status: Abnormal   Collection Time: 07/29/16  3:18 AM  Result Value Ref Range   Sodium 134 (L) 135 - 145 mmol/L   Potassium 3.2 (L) 3.5 - 5.1 mmol/L   Chloride 100 (L) 101 - 111 mmol/L   CO2 23 22 - 32 mmol/L   Glucose, Bld 204 (H) 65 - 99 mg/dL   BUN 62 (H) 6 - 20 mg/dL   Creatinine, Ser 3.23 (H) 0.61 - 1.24 mg/dL   Calcium 9.0 8.9 - 10.3 mg/dL   GFR calc non Af Amer 18 (L) >60 mL/min   GFR calc Af Amer 21 (L) >60 mL/min    Comment: (NOTE) The eGFR has been calculated using the CKD EPI equation. This calculation has not been validated in all clinical situations. eGFR's persistently <60 mL/min signify possible Chronic Kidney Disease.    Anion gap 11 5 - 15  Magnesium     Status: None   Collection Time: 07/29/16  3:18 AM  Result Value Ref Range   Magnesium 1.9 1.7 - 2.4 mg/dL  Glucose, capillary     Status: Abnormal   Collection Time: 07/29/16  7:45 AM  Result Value Ref Range   Glucose-Capillary 236 (H) 65 - 99 mg/dL   Comment 1 Capillary Specimen     ECG   None today  Telemetry   NSR in 60's - Personally Reviewed  Radiology    Dg Chest Port 1 View  Result Date: 07/28/2016 CLINICAL DATA:  Respiratory failure EXAM: PORTABLE CHEST 1 VIEW COMPARISON:  Yesterday FINDINGS: Mild cardiomegaly and vascular pedicle  widening. Progressive interstitial opacity with Kerley lines and perihilar airspace opacity. No effusions noted. Artifact from EKG leads. IMPRESSION: CHF pattern that is increased from yesterday. Electronically Signed   By: Monte Fantasia M.D.   On: 07/28/2016 08:55    Cardiac Studies   LV EF: 40% -   45%  ------------------------------------------------------------------- Indications:      CHF - 428.0.  ------------------------------------------------------------------- History:   Risk factors:  Hypertension. Diabetes mellitus. Dyslipidemia.  ------------------------------------------------------------------- Study Conclusions  - Left ventricle: The cavity size was normal. There was moderate   concentric hypertrophy. Systolic function was mildly to   moderately reduced. The estimated ejection fraction was in the   range of 40% to 45%. Mid to distal inferior, apical, lateral and   apical septal severe hypokinesis. Doppler parameters are   consistent with abnormal left ventricular relaxation (grade 1   diastolic dysfunction). The E/e&' ratio is >15, suggesting   elevated LV filling pressure. - Aortic valve:  Sclerosis without stenosis. There was no   regurgitation. - Mitral valve: Mildly thickened leaflets . There was trivial   regurgitation. - Left atrium: The atrium was normal in size. - Tricuspid valve: There was mild regurgitation. - Pulmonary arteries: PA peak pressure: 40 mm Hg (S). - Inferior vena cava: The vessel was dilated. The respirophasic   diameter changes were blunted (< 50%), consistent with elevated   central venous pressure.  Impressions:  - Compared to a prior study in 2014, the EF is lower at 40-45% with   regional wall motion abnormalities possibly in the LCX territory   distribution, there is diastolic dysfunction with elevated LV   filling pressure, mild pulmonary hypertension and no pericardial   effusion.  Assessment   Principal Problem:    Acute on chronic combined systolic and diastolic CHF (congestive heart failure) (HCC) Active Problems:   Essential hypertension   BPH with obstruction/lower urinary tract symptoms   AKI (acute kidney injury) (HCC)   CKD (chronic kidney disease) stage 4, GFR 15-29 ml/min (HCC)   Elevated troponin   Hypothyroidism   Dyslipidemia   Diabetes mellitus, insulin-dependent (IDDM or type I) (HCC)   NSTEMI (non-ST elevated myocardial infarction) ([place])   Acute pulmonary edema (HCC)   Hypoxemia   Acute respiratory failure with hypoxia [facility])   Plan   Mr. Hammerschmidt was found to have multivessel CAD on cath - plan for CABG with Dr. Servando Snare, tentatively next week. Continues to diurese on IV lasix - will work to optimize BP. Plan wean off nitroglycerin today and start bidil 20/37.5 - 1 tab TID. On coreg 25 mg BID. Continue IV lasix and heparin. LDL-C 45 on Crestor 10 mg daily. Ok to transfer to telemetry floor.  Time Spent Directly with Patient:  25 minutes  Length of Stay:  LOS: 4 days   Pixie Casino, MD, [place]  Attending Cardiologist  Direct Dial: [phone]  Fax: [phone]  Website:  [email or web address] 07/29/2016, 11:01 AM

## 2016-07-30 LAB — CBC
HCT: 26.7 % — ABNORMAL LOW (ref 39.0–52.0)
Hemoglobin: 8.7 g/dL — ABNORMAL LOW (ref 13.0–17.0)
MCH: 29 pg (ref 26.0–34.0)
MCHC: 32.6 g/dL (ref 30.0–36.0)
MCV: 89 fL (ref 78.0–100.0)
PLATELETS: 205 10*3/uL (ref 150–400)
RBC: 3 MIL/uL — AB (ref 4.22–5.81)
RDW: 13.7 % (ref 11.5–15.5)
WBC: 5.2 10*3/uL (ref 4.0–10.5)

## 2016-07-30 LAB — GLUCOSE, CAPILLARY
GLUCOSE-CAPILLARY: 259 mg/dL — AB (ref 65–99)
GLUCOSE-CAPILLARY: 227 mg/dL — AB (ref 65–99)
GLUCOSE-CAPILLARY: 225 mg/dL — AB (ref 65–99)
Glucose-Capillary: 202 mg/dL — ABNORMAL HIGH (ref 65–99)

## 2016-07-30 LAB — BASIC METABOLIC PANEL
ANION GAP: 13 (ref 5–15)
BUN: 56 mg/dL — ABNORMAL HIGH (ref 6–20)
CALCIUM: 8.8 mg/dL — AB (ref 8.9–10.3)
CO2: 22 mmol/L (ref 22–32)
Chloride: 99 mmol/L — ABNORMAL LOW (ref 101–111)
Creatinine, Ser: 3.18 mg/dL — ABNORMAL HIGH (ref 0.61–1.24)
GFR, EST AFRICAN AMERICAN: 21 mL/min — AB (ref >60)
GFR, EST NON AFRICAN AMERICAN: 18 mL/min — AB (ref >60)
GLUCOSE: 242 mg/dL — AB (ref 65–99)
Potassium: 3.2 mmol/L — ABNORMAL LOW (ref 3.5–5.1)
Sodium: 134 mmol/L — ABNORMAL LOW (ref 135–145)

## 2016-07-30 LAB — HEPARIN LEVEL (UNFRACTIONATED)
HEPARIN UNFRACTIONATED: 0.75 [IU]/mL — AB (ref 0.30–0.70)
HEPARIN UNFRACTIONATED: 0.84 [IU]/mL — AB (ref 0.30–0.70)

## 2016-07-30 MED ORDER — POTASSIUM CHLORIDE CRYS ER 20 MEQ PO TBCR
40.0000 meq | EXTENDED_RELEASE_TABLET | ORAL | Status: AC
  Administered 2016-07-30 (×2): 40 meq via ORAL
  Filled 2016-07-30 (×2): qty 2

## 2016-07-30 MED ORDER — MAGNESIUM SULFATE 2 GM/50ML IV SOLN
2.0000 g | Freq: Once | INTRAVENOUS | Status: AC
  Administered 2016-07-30: 2 g via INTRAVENOUS
  Filled 2016-07-30: qty 50

## 2016-07-30 MED ORDER — ISOSORB DINITRATE-HYDRALAZINE 20-37.5 MG PO TABS
2.0000 | ORAL_TABLET | Freq: Three times a day (TID) | ORAL | Status: DC
  Administered 2016-07-30 – 2016-08-04 (×18): 2 via ORAL
  Filled 2016-07-30 (×18): qty 2

## 2016-07-30 NOTE — Progress Notes (Signed)
ANTICOAGULATION CONSULT NOTE - Follow Up Consult  Pharmacy Consult for Heparin Indication: CAD  No Known Allergies  Patient Measurements: Height: 6\' 1"  (185.4 cm) Weight: 204 lb 9.6 oz (92.8 kg) IBW/kg (Calculated) : 79.9 Heparin Dosing Weight: 92.8 kg  Vital Signs: Temp: 98.4 F (36.9 C) (06/16 1640) Temp Source: Oral (06/16 1640) BP: 144/51 (06/16 1640) Pulse Rate: 71 (06/16 1242)  Labs:  Recent Labs  07/28/16 0246 07/29/16 0318 07/29/16 1146 07/30/16 0531 07/30/16 1451  HGB 8.3* 8.6*  --  8.7*  --   HCT 26.0* 25.8*  --  26.7*  --   PLT 173 183  --  205  --   LABPROT 14.2  --   --   --   --   INR 1.09  --   --   --   --   HEPARINUNFRC <0.10* 0.29* 0.42 0.75* 0.84*  CREATININE 3.41* 3.23*  --  3.18*  --     Estimated Creatinine Clearance: 24.1 mL/min (A) (by C-G formula based on SCr of 3.18 mg/dL (H)).   Assessment:  Anticoag: Heparin s/p cath for CABG. Cath 6/14 w/ severe multivessel CAD. CBC low - stable. No overt s/s bleeding noted.  - HL 0.75 has increase to 0.84.   Goal of Therapy:  Heparin level 0.3-0.7 units/ml Monitor platelets by anticoagulation protocol: Yes   Plan:  Decrease IV heparin to 1400 units/hr Recheck HL in 6-8 hrs Daily HL and CBC   Byren Pankow S. Alford Highland, PharmD, BCPS Clinical Staff Pharmacist Pager [phone]  Eilene Ghazi [place] 07/30/2016,4:41 PM

## 2016-07-30 NOTE — Progress Notes (Addendum)
DAILY PROGRESS NOTE   Patient Name: [name] Date of Encounter: 07/30/2016  Hospital Problem List   Principal Problem:   Acute on chronic combined systolic and diastolic CHF (congestive heart failure) (HCC) Active Problems:   Essential hypertension   BPH with obstruction/lower urinary tract symptoms   AKI (acute kidney injury) ([place])   CKD (chronic kidney disease) stage 4, GFR 15-29 ml/min (HCC)   Elevated troponin   Hypothyroidism   Dyslipidemia   Diabetes mellitus, insulin-dependent (IDDM or type I) ([place])   NSTEMI (non-ST elevated myocardial infarction) ([place])   Acute pulmonary edema (HCC)   Hypoxemia   Acute respiratory failure with hypoxia [facility])    Chief Complaint   Some chest pain yesterday, went back on nitro gtts  Subjective   BP remains elevated, despite starting Bidil. Required addition of nitroglycerin gtts. Pending CABG next week. Remains on IV heparin. Diuresed an additional 700 cc. Now 5.4L negative. Creatinine continues to improve - now down to 3.18. Potassium remains low at 3.2 today (mag 1.9 yesterday).  Objective   Vitals:   07/30/16 0145 07/30/16 0345 07/30/16 0426 07/30/16 0545  BP: (!) 136/94 (!) 171/73 (!) 148/66 (!) 154/61  Pulse:  74 77   Resp: 17 (!) 30 19 20   Temp:  98.5 F (36.9 C) 98.9 F (37.2 C)   TempSrc:  Oral Oral   SpO2:  98%    Weight:   204 lb 9.6 oz (92.8 kg)   Height:        Intake/Output Summary (Last 24 hours) at 07/30/16 0806 Last data filed at 07/30/16 4132  Gross per 24 hour  Intake           1677.5 ml  Output             2425 ml  Net           -747.5 ml   Filed Weights   07/28/16 0500 07/29/16 0421 07/30/16 0426  Weight: 205 lb 14.6 oz (93.4 kg) 204 lb 2.3 oz (92.6 kg) 204 lb 9.6 oz (92.8 kg)    Physical Exam   General appearance: alert, appears stated age and no distress Neck: no carotid bruit, no JVD and thyroid not enlarged, symmetric, no tenderness/mass/nodules Lungs: clear to auscultation  bilaterally Heart: regular rate and rhythm, S1, S2 normal and systolic murmur: early systolic 2/6, blowing at apex Abdomen: soft, non-tender; bowel sounds normal; no masses,  no organomegaly Extremities: extremities normal, atraumatic, no cyanosis or edema Pulses: 2+ and symmetric Skin: Skin color, texture, turgor normal. No rashes or lesions Neurologic: Grossly normal Psych: Pleasant  Inpatient Medications    Scheduled Meds: . allopurinol  100 mg Oral Daily  . amLODipine  10 mg Oral Daily  . aspirin  81 mg Oral Daily  . carvedilol  25 mg Oral BID WC  . cholecalciferol  2,000 Units Oral Daily  . doxazosin  4 mg Oral BID  . furosemide  80 mg Intravenous BID  . insulin aspart  0-9 Units Subcutaneous TID WC  . insulin aspart protamine- aspart  5 Units Subcutaneous BID WC  . isosorbide-hydrALAZINE  1 tablet Oral TID  . levothyroxine  100 mcg Oral QAC breakfast  . lubiprostone  24 mcg Oral BID  . rosuvastatin  10 mg Oral QPM  . sodium bicarbonate  650 mg Oral BID  . sodium chloride flush  3 mL Intravenous Q12H  . tamsulosin  0.8 mg Oral QPM    Continuous Infusions: . sodium  chloride 250 mL (07/29/16 2130)  . heparin 1,750 Units/hr (07/29/16 1957)  . nitroGLYCERIN 60 mcg/min (07/30/16 0500)    PRN Meds: sodium chloride, acetaminophen, hydrALAZINE, nitroGLYCERIN, ondansetron (ZOFRAN) IV, sodium chloride flush   Labs   Results for orders placed or performed during the hospital encounter of 07/25/16 (from the past 48 hour(s))  Glucose, capillary     Status: Abnormal   Collection Time: 07/28/16 11:38 AM  Result Value Ref Range   Glucose-Capillary 141 (H) 65 - 99 mg/dL   Comment 1 Notify RN   Glucose, capillary     Status: Abnormal   Collection Time: 07/28/16  4:07 PM  Result Value Ref Range   Glucose-Capillary 223 (H) 65 - 99 mg/dL   Comment 1 Capillary Specimen    Comment 2 Notify RN   Glucose, capillary     Status: Abnormal   Collection Time: 07/28/16 11:22 PM  Result  Value Ref Range   Glucose-Capillary 267 (H) 65 - 99 mg/dL   Comment 1 Capillary Specimen   CBC     Status: Abnormal   Collection Time: 07/29/16  3:18 AM  Result Value Ref Range   WBC 5.3 4.0 - 10.5 K/uL   RBC 2.93 (L) 4.22 - 5.81 MIL/uL   Hemoglobin 8.6 (L) 13.0 - 17.0 g/dL   HCT 25.8 (L) 39.0 - 52.0 %   MCV 88.1 78.0 - 100.0 fL   MCH 29.4 26.0 - 34.0 pg   MCHC 33.3 30.0 - 36.0 g/dL   RDW 13.6 11.5 - 15.5 %   Platelets 183 150 - 400 K/uL  Heparin level (unfractionated)     Status: Abnormal   Collection Time: 07/29/16  3:18 AM  Result Value Ref Range   Heparin Unfractionated 0.29 (L) 0.30 - 0.70 IU/mL    Comment:        IF HEPARIN RESULTS ARE BELOW EXPECTED VALUES, AND PATIENT DOSAGE HAS BEEN CONFIRMED, SUGGEST FOLLOW UP TESTING OF ANTITHROMBIN III LEVELS.   Basic metabolic panel     Status: Abnormal   Collection Time: 07/29/16  3:18 AM  Result Value Ref Range   Sodium 134 (L) 135 - 145 mmol/L   Potassium 3.2 (L) 3.5 - 5.1 mmol/L   Chloride 100 (L) 101 - 111 mmol/L   CO2 23 22 - 32 mmol/L   Glucose, Bld 204 (H) 65 - 99 mg/dL   BUN 62 (H) 6 - 20 mg/dL   Creatinine, Ser 3.23 (H) 0.61 - 1.24 mg/dL   Calcium 9.0 8.9 - 10.3 mg/dL   GFR calc non Af Amer 18 (L) >60 mL/min   GFR calc Af Amer 21 (L) >60 mL/min    Comment: (NOTE) The eGFR has been calculated using the CKD EPI equation. This calculation has not been validated in all clinical situations. eGFR's persistently <60 mL/min signify possible Chronic Kidney Disease.    Anion gap 11 5 - 15  Magnesium     Status: None   Collection Time: 07/29/16  3:18 AM  Result Value Ref Range   Magnesium 1.9 1.7 - 2.4 mg/dL  Glucose, capillary     Status: Abnormal   Collection Time: 07/29/16  7:45 AM  Result Value Ref Range   Glucose-Capillary 236 (H) 65 - 99 mg/dL   Comment 1 Capillary Specimen   Glucose, capillary     Status: Abnormal   Collection Time: 07/29/16 11:27 AM  Result Value Ref Range   Glucose-Capillary 173 (H) 65 -  99 mg/dL  Heparin level (unfractionated)  Status: None   Collection Time: 07/29/16 11:46 AM  Result Value Ref Range   Heparin Unfractionated 0.42 0.30 - 0.70 IU/mL    Comment:        IF HEPARIN RESULTS ARE BELOW EXPECTED VALUES, AND PATIENT DOSAGE HAS BEEN CONFIRMED, SUGGEST FOLLOW UP TESTING OF ANTITHROMBIN III LEVELS.   Glucose, capillary     Status: Abnormal   Collection Time: 07/29/16  4:20 PM  Result Value Ref Range   Glucose-Capillary 212 (H) 65 - 99 mg/dL  Glucose, capillary     Status: Abnormal   Collection Time: 07/29/16  8:25 PM  Result Value Ref Range   Glucose-Capillary 237 (H) 65 - 99 mg/dL  CBC     Status: Abnormal   Collection Time: 07/30/16  5:31 AM  Result Value Ref Range   WBC 5.2 4.0 - 10.5 K/uL   RBC 3.00 (L) 4.22 - 5.81 MIL/uL   Hemoglobin 8.7 (L) 13.0 - 17.0 g/dL   HCT 26.7 (L) 39.0 - 52.0 %   MCV 89.0 78.0 - 100.0 fL   MCH 29.0 26.0 - 34.0 pg   MCHC 32.6 30.0 - 36.0 g/dL   RDW 13.7 11.5 - 15.5 %   Platelets 205 150 - 400 K/uL  Heparin level (unfractionated)     Status: Abnormal   Collection Time: 07/30/16  5:31 AM  Result Value Ref Range   Heparin Unfractionated 0.75 (H) 0.30 - 0.70 IU/mL    Comment:        IF HEPARIN RESULTS ARE BELOW EXPECTED VALUES, AND PATIENT DOSAGE HAS BEEN CONFIRMED, SUGGEST FOLLOW UP TESTING OF ANTITHROMBIN III LEVELS.   Basic metabolic panel     Status: Abnormal   Collection Time: 07/30/16  5:31 AM  Result Value Ref Range   Sodium 134 (L) 135 - 145 mmol/L   Potassium 3.2 (L) 3.5 - 5.1 mmol/L   Chloride 99 (L) 101 - 111 mmol/L   CO2 22 22 - 32 mmol/L   Glucose, Bld 242 (H) 65 - 99 mg/dL   BUN 56 (H) 6 - 20 mg/dL   Creatinine, Ser 3.18 (H) 0.61 - 1.24 mg/dL   Calcium 8.8 (L) 8.9 - 10.3 mg/dL   GFR calc non Af Amer 18 (L) >60 mL/min   GFR calc Af Amer 21 (L) >60 mL/min    Comment: (NOTE) The eGFR has been calculated using the CKD EPI equation. This calculation has not been validated in all clinical  situations. eGFR's persistently <60 mL/min signify possible Chronic Kidney Disease.    Anion gap 13 5 - 15  Glucose, capillary     Status: Abnormal   Collection Time: 07/30/16  7:17 AM  Result Value Ref Range   Glucose-Capillary 259 (H) 65 - 99 mg/dL    ECG   None today  Telemetry   NSR in 60-70 range - Personally Reviewed  Radiology    No results found.  Cardiac Studies   LV EF: 40% -   45%  ------------------------------------------------------------------- Indications:      CHF - 428.0.  ------------------------------------------------------------------- History:   Risk factors:  Hypertension. Diabetes mellitus. Dyslipidemia.  ------------------------------------------------------------------- Study Conclusions  - Left ventricle: The cavity size was normal. There was moderate   concentric hypertrophy. Systolic function was mildly to   moderately reduced. The estimated ejection fraction was in the   range of 40% to 45%. Mid to distal inferior, apical, lateral and   apical septal severe hypokinesis. Doppler parameters are   consistent with abnormal left ventricular  relaxation (grade 1   diastolic dysfunction). The E/e&' ratio is >15, suggesting   elevated LV filling pressure. - Aortic valve: Sclerosis without stenosis. There was no   regurgitation. - Mitral valve: Mildly thickened leaflets . There was trivial   regurgitation. - Left atrium: The atrium was normal in size. - Tricuspid valve: There was mild regurgitation. - Pulmonary arteries: PA peak pressure: 40 mm Hg (S). - Inferior vena cava: The vessel was dilated. The respirophasic   diameter changes were blunted (< 50%), consistent with elevated   central venous pressure.  Impressions:  - Compared to a prior study in 2014, the EF is lower at 40-45% with   regional wall motion abnormalities possibly in the LCX territory   distribution, there is diastolic dysfunction with elevated LV   filling  pressure, mild pulmonary hypertension and no pericardial   effusion.  Assessment   Principal Problem:   Acute on chronic combined systolic and diastolic CHF (congestive heart failure) (HCC) Active Problems:   Essential hypertension   BPH with obstruction/lower urinary tract symptoms   AKI (acute kidney injury) (HCC)   CKD (chronic kidney disease) stage 4, GFR 15-29 ml/min (HCC)   Elevated troponin   Hypothyroidism   Dyslipidemia   Diabetes mellitus, insulin-dependent (IDDM or type I) (HCC)   NSTEMI (non-ST elevated myocardial infarction) ([place])   Acute pulmonary edema (HCC)   Hypoxemia   Acute respiratory failure with hypoxia [facility])   Plan   Mr. Winker continues to be hypertensive - will increase bidil to 2 tablets TID - try to wean off nitroglycerin gtts today. Creatinine improving with diuresis - will continue until it stabilizes or urine output tails off. Optimizing medically for CABG next week. Replete K+ and Mg2+ today.  Time Spent Directly with Patient:  15 minutes  Length of Stay:  LOS: 5 days   Pixie Casino, MD, [place]  Attending Cardiologist  Direct Dial: [phone]  Fax: [phone]  Website:  [email or web address] Detric Scalisi 07/30/2016, 8:06 AM

## 2016-07-30 NOTE — Progress Notes (Signed)
Contacted PA to inquire if foley catheter needed to be removed, advised to leave for now to facilitate accurate I&Os and also in consideration of BPH.  Edward Qualia RN

## 2016-07-30 NOTE — Progress Notes (Signed)
Rec'd a call from RN regarding foley cath, Coude type. Asking about removal. Advised that it would be much better to remove during morning hours, he may have trouble voiding. Recommended to continue catheter for now, since he is still on IV Lasix. Dr Debara Pickett to address in am.  Lenoard Aden 07/30/2016 12:50 PM Beeper [phone]

## 2016-07-30 NOTE — Progress Notes (Signed)
ANTICOAGULATION CONSULT NOTE   Pharmacy Consult for Heparin Indication: chest pain/ACS  No Known Allergies  Patient Measurements: Height: 6\' 1"  (185.4 cm) Weight: 204 lb 9.6 oz (92.8 kg) IBW/kg (Calculated) : 79.9 Heparin Dosing Weight:  96.8 kg  Vital Signs: Temp: 98.9 F (37.2 C) (06/16 0426) Temp Source: Oral (06/16 0426) BP: 154/61 (06/16 0545) Pulse Rate: 77 (06/16 0426)  Labs:  Recent Labs  07/27/16 1044  07/28/16 0246 07/29/16 0318 07/29/16 1146 07/30/16 0531  HGB  --   < > 8.3* 8.6*  --  8.7*  HCT  --   --  26.0* 25.8*  --  26.7*  PLT  --   --  173 183  --  205  LABPROT  --   --  14.2  --   --   --   INR  --   --  1.09  --   --   --   HEPARINUNFRC  --   < > <0.10* 0.29* 0.42 0.75*  CREATININE 3.67*  --  3.41* 3.23*  --   --   < > = values in this interval not displayed.  Estimated Creatinine Clearance: 23.7 mL/min (A) (by C-G formula based on SCr of 3.23 mg/dL (H)).  Assessment: 71 yo male with NSTEMI s/p cath with severe multivessel CAD and plans for CABG next week. Heparin level slightly supratherapeutic (0.75) on gtt at 1750 units/hr. No bleeding noted.  Goal of Therapy:  Heparin level 0.3-0.7 units/ml Monitor platelets by anticoagulation protocol: Yes   Plan:  Will decrease heparin to 1650 units/hr Will f/u 8 hr heparin level  Sherlon Handing, PharmD, BCPS Clinical pharmacist, pager [phone]  07/30/2016 7:11 AM

## 2016-07-31 LAB — HEPARIN LEVEL (UNFRACTIONATED)
HEPARIN UNFRACTIONATED: 0.55 [IU]/mL (ref 0.30–0.70)
HEPARIN UNFRACTIONATED: 0.58 [IU]/mL (ref 0.30–0.70)

## 2016-07-31 LAB — CBC
HCT: 27.1 % — ABNORMAL LOW (ref 39.0–52.0)
Hemoglobin: 8.7 g/dL — ABNORMAL LOW (ref 13.0–17.0)
MCH: 28.8 pg (ref 26.0–34.0)
MCHC: 32.1 g/dL (ref 30.0–36.0)
MCV: 89.7 fL (ref 78.0–100.0)
PLATELETS: 233 10*3/uL (ref 150–400)
RBC: 3.02 MIL/uL — ABNORMAL LOW (ref 4.22–5.81)
RDW: 14.3 % (ref 11.5–15.5)
WBC: 6.5 10*3/uL (ref 4.0–10.5)

## 2016-07-31 LAB — GLUCOSE, CAPILLARY
GLUCOSE-CAPILLARY: 251 mg/dL — AB (ref 65–99)
GLUCOSE-CAPILLARY: 218 mg/dL — AB (ref 65–99)
Glucose-Capillary: 207 mg/dL — ABNORMAL HIGH (ref 65–99)
Glucose-Capillary: 185 mg/dL — ABNORMAL HIGH (ref 65–99)

## 2016-07-31 LAB — BASIC METABOLIC PANEL
Anion gap: 11 (ref 5–15)
BUN: 52 mg/dL — AB (ref 6–20)
CALCIUM: 8.9 mg/dL (ref 8.9–10.3)
CHLORIDE: 101 mmol/L (ref 101–111)
CO2: 24 mmol/L (ref 22–32)
CREATININE: 3.3 mg/dL — AB (ref 0.61–1.24)
GFR calc non Af Amer: 17 mL/min — ABNORMAL LOW (ref >60)
GFR, EST AFRICAN AMERICAN: 20 mL/min — AB (ref >60)
Glucose, Bld: 224 mg/dL — ABNORMAL HIGH (ref 65–99)
Potassium: 3.4 mmol/L — ABNORMAL LOW (ref 3.5–5.1)
SODIUM: 136 mmol/L (ref 135–145)

## 2016-07-31 NOTE — Progress Notes (Signed)
ANTICOAGULATION CONSULT NOTE - Follow Up Consult  Pharmacy Consult for heparin Indication: CAD  Labs:  Recent Labs  07/28/16 0246 07/29/16 0318  07/30/16 0531 07/30/16 1451 07/31/16 0021  HGB 8.3* 8.6*  --  8.7*  --   --   HCT 26.0* 25.8*  --  26.7*  --   --   PLT 173 183  --  205  --   --   LABPROT 14.2  --   --   --   --   --   INR 1.09  --   --   --   --   --   HEPARINUNFRC <0.10* 0.29*  < > 0.75* 0.84* 0.58  CREATININE 3.41* 3.23*  --  3.18*  --   --   < > = values in this interval not displayed.   Assessment/Plan:  71yo male therapeutic on heparin after rate changes. Will continue gtt at current rate and confirm stable with additional level.   Wynona Neat, PharmD, BCPS  07/31/2016,1:17 AM

## 2016-07-31 NOTE — Progress Notes (Signed)
ANTICOAGULATION CONSULT NOTE - Follow Up Consult  Pharmacy Consult for Heparin Indication: ACS/STEMI  No Known Allergies  Patient Measurements: Height: 6\' 1"  (185.4 cm) Weight: 203 lb 14.4 oz (92.5 kg) IBW/kg (Calculated) : 79.9 Heparin Dosing Weight: 92.8 kg  Vital Signs: Temp: 98.6 F (37 C) (06/17 0759) Temp Source: Oral (06/17 0759) BP: 163/74 (06/17 0759) Pulse Rate: 69 (06/17 0759)  Labs:  Recent Labs  07/29/16 0318  07/30/16 0531 07/30/16 1451 07/31/16 0021 07/31/16 0505  HGB 8.6*  --  8.7*  --   --  8.7*  HCT 25.8*  --  26.7*  --   --  27.1*  PLT 183  --  205  --   --  233  HEPARINUNFRC 0.29*  < > 0.75* 0.84* 0.58 0.55  CREATININE 3.23*  --  3.18*  --   --  3.30*  < > = values in this interval not displayed.  Estimated Creatinine Clearance: 23.2 mL/min (A) (by C-G formula based on SCr of 3.3 mg/dL (H)).   Assessment: 71 yo male s/p cath for CABG next week. Cath 6/14 with severe multivessel CAD. Pharmacy to dose heparin. Heparin level therapeutic at 0.55. CBC low - stable. No overt s/s bleeding noted.   Goal of Therapy:  Heparin level 0.3-0.7 units/ml Monitor platelets by anticoagulation protocol: Yes   Plan:  Continue heparin gtt at 1400 units/hr Daily heparin level and CBC Monitor for s/s bleeding F/u surgery plans   Argie Ramming, PharmD Pharmacy Resident  Pager [phone] 07/31/16 9:02 AM

## 2016-07-31 NOTE — Progress Notes (Signed)
DAILY PROGRESS NOTE   Patient Name: [name] Date of Encounter: 07/31/2016  Hospital Problem List   Principal Problem:   Acute on chronic combined systolic and diastolic CHF (congestive heart failure) (HCC) Active Problems:   Essential hypertension   BPH with obstruction/lower urinary tract symptoms   AKI (acute kidney injury) ([place])   CKD (chronic kidney disease) stage 4, GFR 15-29 ml/min (HCC)   Elevated troponin   Hypothyroidism   Dyslipidemia   Diabetes mellitus, insulin-dependent (IDDM or type I) ([place])   NSTEMI (non-ST elevated myocardial infarction) ([place])   Acute pulmonary edema (HCC)   Hypoxemia   Acute respiratory failure with hypoxia ([place])    Chief Complaint   No complaints  Subjective   Now 6L negative - BP still elevated, despite increasing bidil - may be related to ischemia. Creatinine trending up. Will hold lasix. Off nitroglycerin gtts.  Objective   Vitals:   07/30/16 2030 07/31/16 0002 07/31/16 0435 07/31/16 0759  BP: (!) 127/43 (!) 152/52 (!) 152/70 (!) 163/74  Pulse: 71 72 72 69  Resp: 20 15 16 12   Temp: 98.7 F (37.1 C) 98.6 F (37 C)  98.6 F (37 C)  TempSrc: Oral Oral  Oral  SpO2: 100% 100% 100% 100%  Weight:   203 lb 14.4 oz (92.5 kg)   Height:        Intake/Output Summary (Last 24 hours) at 07/31/16 1102 Last data filed at 07/31/16 1026  Gross per 24 hour  Intake           611.93 ml  Output             2100 ml  Net         -1488.07 ml   Filed Weights   07/29/16 0421 07/30/16 0426 07/31/16 0435  Weight: 204 lb 2.3 oz (92.6 kg) 204 lb 9.6 oz (92.8 kg) 203 lb 14.4 oz (92.5 kg)    Physical Exam   General appearance: alert, appears stated age and no distress Neck: no carotid bruit and no JVD Lungs: clear to auscultation bilaterally Heart: regular rate and rhythm, S1, S2 normal and systolic murmur: early systolic 2/6, blowing at apex Abdomen: soft, non-tender; bowel sounds normal; no masses,  no organomegaly Extremities:  extremities normal, atraumatic, no cyanosis or edema Pulses: 2+ and symmetric Skin: Skin color, texture, turgor normal. No rashes or lesions Neurologic: Grossly normal Psych: Pleasant  Inpatient Medications    Scheduled Meds: . allopurinol  100 mg Oral Daily  . amLODipine  10 mg Oral Daily  . aspirin  81 mg Oral Daily  . carvedilol  25 mg Oral BID WC  . cholecalciferol  2,000 Units Oral Daily  . doxazosin  4 mg Oral BID  . furosemide  80 mg Intravenous BID  . insulin aspart  0-9 Units Subcutaneous TID WC  . insulin aspart protamine- aspart  5 Units Subcutaneous BID WC  . isosorbide-hydrALAZINE  2 tablet Oral TID  . levothyroxine  100 mcg Oral QAC breakfast  . lubiprostone  24 mcg Oral BID  . rosuvastatin  10 mg Oral QPM  . sodium bicarbonate  650 mg Oral BID  . sodium chloride flush  3 mL Intravenous Q12H  . tamsulosin  0.8 mg Oral QPM    Continuous Infusions: . sodium chloride 250 mL (07/29/16 2130)  . heparin 1,400 Units/hr (07/30/16 2210)  . nitroGLYCERIN Stopped (07/30/16 1926)    PRN Meds: sodium chloride, acetaminophen, hydrALAZINE, nitroGLYCERIN, ondansetron (ZOFRAN) IV, sodium chloride  flush   Labs   Results for orders placed or performed during the hospital encounter of 07/25/16 (from the past 48 hour(s))  Glucose, capillary     Status: Abnormal   Collection Time: 07/29/16 11:27 AM  Result Value Ref Range   Glucose-Capillary 173 (H) 65 - 99 mg/dL  Heparin level (unfractionated)     Status: None   Collection Time: 07/29/16 11:46 AM  Result Value Ref Range   Heparin Unfractionated 0.42 0.30 - 0.70 IU/mL    Comment:        IF HEPARIN RESULTS ARE BELOW EXPECTED VALUES, AND PATIENT DOSAGE HAS BEEN CONFIRMED, SUGGEST FOLLOW UP TESTING OF ANTITHROMBIN III LEVELS.   Glucose, capillary     Status: Abnormal   Collection Time: 07/29/16  4:20 PM  Result Value Ref Range   Glucose-Capillary 212 (H) 65 - 99 mg/dL  Glucose, capillary     Status: Abnormal    Collection Time: 07/29/16  8:25 PM  Result Value Ref Range   Glucose-Capillary 237 (H) 65 - 99 mg/dL  CBC     Status: Abnormal   Collection Time: 07/30/16  5:31 AM  Result Value Ref Range   WBC 5.2 4.0 - 10.5 K/uL   RBC 3.00 (L) 4.22 - 5.81 MIL/uL   Hemoglobin 8.7 (L) 13.0 - 17.0 g/dL   HCT 26.7 (L) 39.0 - 52.0 %   MCV 89.0 78.0 - 100.0 fL   MCH 29.0 26.0 - 34.0 pg   MCHC 32.6 30.0 - 36.0 g/dL   RDW 13.7 11.5 - 15.5 %   Platelets 205 150 - 400 K/uL  Heparin level (unfractionated)     Status: Abnormal   Collection Time: 07/30/16  5:31 AM  Result Value Ref Range   Heparin Unfractionated 0.75 (H) 0.30 - 0.70 IU/mL    Comment:        IF HEPARIN RESULTS ARE BELOW EXPECTED VALUES, AND PATIENT DOSAGE HAS BEEN CONFIRMED, SUGGEST FOLLOW UP TESTING OF ANTITHROMBIN III LEVELS.   Basic metabolic panel     Status: Abnormal   Collection Time: 07/30/16  5:31 AM  Result Value Ref Range   Sodium 134 (L) 135 - 145 mmol/L   Potassium 3.2 (L) 3.5 - 5.1 mmol/L   Chloride 99 (L) 101 - 111 mmol/L   CO2 22 22 - 32 mmol/L   Glucose, Bld 242 (H) 65 - 99 mg/dL   BUN 56 (H) 6 - 20 mg/dL   Creatinine, Ser 3.18 (H) 0.61 - 1.24 mg/dL   Calcium 8.8 (L) 8.9 - 10.3 mg/dL   GFR calc non Af Amer 18 (L) >60 mL/min   GFR calc Af Amer 21 (L) >60 mL/min    Comment: (NOTE) The eGFR has been calculated using the CKD EPI equation. This calculation has not been validated in all clinical situations. eGFR's persistently <60 mL/min signify possible Chronic Kidney Disease.    Anion gap 13 5 - 15  Glucose, capillary     Status: Abnormal   Collection Time: 07/30/16  7:17 AM  Result Value Ref Range   Glucose-Capillary 259 (H) 65 - 99 mg/dL  Glucose, capillary     Status: Abnormal   Collection Time: 07/30/16 11:17 AM  Result Value Ref Range   Glucose-Capillary 227 (H) 65 - 99 mg/dL  Heparin level (unfractionated)     Status: Abnormal   Collection Time: 07/30/16  2:51 PM  Result Value Ref Range   Heparin  Unfractionated 0.84 (H) 0.30 - 0.70 IU/mL  Comment:        IF HEPARIN RESULTS ARE BELOW EXPECTED VALUES, AND PATIENT DOSAGE HAS BEEN CONFIRMED, SUGGEST FOLLOW UP TESTING OF ANTITHROMBIN III LEVELS.   Glucose, capillary     Status: Abnormal   Collection Time: 07/30/16  4:38 PM  Result Value Ref Range   Glucose-Capillary 225 (H) 65 - 99 mg/dL  Glucose, capillary     Status: Abnormal   Collection Time: 07/30/16  8:34 PM  Result Value Ref Range   Glucose-Capillary 202 (H) 65 - 99 mg/dL  Heparin level (unfractionated)     Status: None   Collection Time: 07/31/16 12:21 AM  Result Value Ref Range   Heparin Unfractionated 0.58 0.30 - 0.70 IU/mL    Comment:        IF HEPARIN RESULTS ARE BELOW EXPECTED VALUES, AND PATIENT DOSAGE HAS BEEN CONFIRMED, SUGGEST FOLLOW UP TESTING OF ANTITHROMBIN III LEVELS.   CBC     Status: Abnormal   Collection Time: 07/31/16  5:05 AM  Result Value Ref Range   WBC 6.5 4.0 - 10.5 K/uL   RBC 3.02 (L) 4.22 - 5.81 MIL/uL   Hemoglobin 8.7 (L) 13.0 - 17.0 g/dL   HCT 27.1 (L) 39.0 - 52.0 %   MCV 89.7 78.0 - 100.0 fL   MCH 28.8 26.0 - 34.0 pg   MCHC 32.1 30.0 - 36.0 g/dL   RDW 14.3 11.5 - 15.5 %   Platelets 233 150 - 400 K/uL  Heparin level (unfractionated)     Status: None   Collection Time: 07/31/16  5:05 AM  Result Value Ref Range   Heparin Unfractionated 0.55 0.30 - 0.70 IU/mL    Comment:        IF HEPARIN RESULTS ARE BELOW EXPECTED VALUES, AND PATIENT DOSAGE HAS BEEN CONFIRMED, SUGGEST FOLLOW UP TESTING OF ANTITHROMBIN III LEVELS.   Basic metabolic panel     Status: Abnormal   Collection Time: 07/31/16  5:05 AM  Result Value Ref Range   Sodium 136 135 - 145 mmol/L   Potassium 3.4 (L) 3.5 - 5.1 mmol/L   Chloride 101 101 - 111 mmol/L   CO2 24 22 - 32 mmol/L   Glucose, Bld 224 (H) 65 - 99 mg/dL   BUN 52 (H) 6 - 20 mg/dL   Creatinine, Ser 3.30 (H) 0.61 - 1.24 mg/dL   Calcium 8.9 8.9 - 10.3 mg/dL   GFR calc non Af Amer 17 (L) >60 mL/min    GFR calc Af Amer 20 (L) >60 mL/min    Comment: (NOTE) The eGFR has been calculated using the CKD EPI equation. This calculation has not been validated in all clinical situations. eGFR's persistently <60 mL/min signify possible Chronic Kidney Disease.    Anion gap 11 5 - 15  Glucose, capillary     Status: Abnormal   Collection Time: 07/31/16  7:57 AM  Result Value Ref Range   Glucose-Capillary 251 (H) 65 - 99 mg/dL   Comment 1 Document in Chart     ECG   None today  Telemetry   NSR in 60-70 range - Personally Reviewed  Radiology    No results found.  Cardiac Studies   LV EF: 40% -   45%  ------------------------------------------------------------------- Indications:      CHF - 428.0.  ------------------------------------------------------------------- History:   Risk factors:  Hypertension. Diabetes mellitus. Dyslipidemia.  ------------------------------------------------------------------- Study Conclusions  - Left ventricle: The cavity size was normal. There was moderate   concentric hypertrophy. Systolic function was mildly  to   moderately reduced. The estimated ejection fraction was in the   range of 40% to 45%. Mid to distal inferior, apical, lateral and   apical septal severe hypokinesis. Doppler parameters are   consistent with abnormal left ventricular relaxation (grade 1   diastolic dysfunction). The E/e&' ratio is >15, suggesting   elevated LV filling pressure. - Aortic valve: Sclerosis without stenosis. There was no   regurgitation. - Mitral valve: Mildly thickened leaflets . There was trivial   regurgitation. - Left atrium: The atrium was normal in size. - Tricuspid valve: There was mild regurgitation. - Pulmonary arteries: PA peak pressure: 40 mm Hg (S). - Inferior vena cava: The vessel was dilated. The respirophasic   diameter changes were blunted (< 50%), consistent with elevated   central venous pressure.  Impressions:  - Compared to  a prior study in 2014, the EF is lower at 40-45% with   regional wall motion abnormalities possibly in the LCX territory   distribution, there is diastolic dysfunction with elevated LV   filling pressure, mild pulmonary hypertension and no pericardial   effusion.  Assessment   Principal Problem:   Acute on chronic combined systolic and diastolic CHF (congestive heart failure) (HCC) Active Problems:   Essential hypertension   BPH with obstruction/lower urinary tract symptoms   AKI (acute kidney injury) (HCC)   CKD (chronic kidney disease) stage 4, GFR 15-29 ml/min (HCC)   Elevated troponin   Hypothyroidism   Dyslipidemia   Diabetes mellitus, insulin-dependent (IDDM or type I) (HCC)   NSTEMI (non-ST elevated myocardial infarction) ([place])   Acute pulmonary edema (HCC)   Hypoxemia   Acute respiratory failure with hypoxia [facility])   Plan   Mr. Jarchow is improving - creatinine rising. Hold lasix. BP still elevated - will not further increase meds - suspect will improve with revascularization. Off nitro gtts. No further chest pain. Plan for CABG 6/22.  Time Spent Directly with Patient:  15 minutes  Length of Stay:  LOS: 6 days   Pixie Casino, MD, [place]  Attending Cardiologist  Direct Dial: [phone]  Fax: [phone]  Website:  [email or web address] 07/31/2016, 11:02 AM

## 2016-08-01 ENCOUNTER — Inpatient Hospital Stay (HOSPITAL_COMMUNITY): Payer: Medicare Other

## 2016-08-01 LAB — CBC
HCT: 26.6 % — ABNORMAL LOW (ref 39.0–52.0)
Hemoglobin: 8.6 g/dL — ABNORMAL LOW (ref 13.0–17.0)
MCH: 29 pg (ref 26.0–34.0)
MCHC: 32.3 g/dL (ref 30.0–36.0)
MCV: 89.6 fL (ref 78.0–100.0)
PLATELETS: 239 10*3/uL (ref 150–400)
RBC: 2.97 MIL/uL — ABNORMAL LOW (ref 4.22–5.81)
RDW: 14.2 % (ref 11.5–15.5)
WBC: 6.6 10*3/uL (ref 4.0–10.5)

## 2016-08-01 LAB — BASIC METABOLIC PANEL
Anion gap: 10 (ref 5–15)
BUN: 52 mg/dL — AB (ref 6–20)
CALCIUM: 8.9 mg/dL (ref 8.9–10.3)
CHLORIDE: 100 mmol/L — AB (ref 101–111)
CO2: 23 mmol/L (ref 22–32)
CREATININE: 3.35 mg/dL — AB (ref 0.61–1.24)
GFR calc Af Amer: 20 mL/min — ABNORMAL LOW (ref >60)
GFR calc non Af Amer: 17 mL/min — ABNORMAL LOW (ref >60)
Glucose, Bld: 182 mg/dL — ABNORMAL HIGH (ref 65–99)
Potassium: 3.5 mmol/L (ref 3.5–5.1)
SODIUM: 133 mmol/L — AB (ref 135–145)

## 2016-08-01 LAB — PULMONARY FUNCTION TEST
FEF 25-75 Pre: 3.78 L/sec
FEF2575-%Pred-Pre: 139 %
FEV1-%Pred-Pre: 88 %
FEV1-Pre: 2.84 L
FEV1FVC-%Pred-Pre: 117 %
FEV6-%Pred-Pre: 78 %
FEV6-Pre: 3.19 L
FEV6FVC-%Pred-Pre: 104 %
FVC-%Pred-Pre: 74 %
FVC-Pre: 3.19 L
Pre FEV1/FVC ratio: 89 %
Pre FEV6/FVC Ratio: 100 %

## 2016-08-01 LAB — GLUCOSE, CAPILLARY
GLUCOSE-CAPILLARY: 205 mg/dL — AB (ref 65–99)
Glucose-Capillary: 194 mg/dL — ABNORMAL HIGH (ref 65–99)
Glucose-Capillary: 179 mg/dL — ABNORMAL HIGH (ref 65–99)
Glucose-Capillary: 227 mg/dL — ABNORMAL HIGH (ref 65–99)

## 2016-08-01 LAB — HEPARIN LEVEL (UNFRACTIONATED): Heparin Unfractionated: 0.58 IU/mL (ref 0.30–0.70)

## 2016-08-01 MED ORDER — ACETAMINOPHEN 325 MG PO TABS
650.0000 mg | ORAL_TABLET | Freq: Four times a day (QID) | ORAL | Status: DC | PRN
  Administered 2016-08-01 – 2016-08-04 (×3): 650 mg via ORAL
  Filled 2016-08-01 (×3): qty 2

## 2016-08-01 MED ORDER — DOXAZOSIN MESYLATE 8 MG PO TABS
8.0000 mg | ORAL_TABLET | Freq: Two times a day (BID) | ORAL | Status: DC
  Administered 2016-08-01 – 2016-08-05 (×8): 8 mg via ORAL
  Filled 2016-08-01 (×11): qty 1

## 2016-08-01 NOTE — Progress Notes (Signed)
Physical Therapy Treatment Patient Details Name: [name] MRN: [id number] DOB: [birth year] Today's Date: 08/01/2016    History of Present Illness 71 year old male w/ acute on chronic heart failure, c/b CRI stage IV. Admitted w/ resultant pulmonary edema. As of 6/13, moved to ICU for closer observation, aggressive diuresis and NIPPV.     PT Comments    Pt with overall improved activity tolerance however remains to have decreased endurance compared to PTA. Pt is planned for CABG procedure on 6/22. Acute PT to con't to follow to improve strength endurance prior to surgery. Pt's O2 >97% on RA t/o PT session.   Follow Up Recommendations  Home health PT;Supervision/Assistance - 24 hour     Equipment Recommendations  Rolling walker with 5" wheels;3in1 (PT)    Recommendations for Other Services       Precautions / Restrictions Precautions Precautions: Fall Restrictions Weight Bearing Restrictions: No    Mobility  Bed Mobility Overal bed mobility: Modified Independent             General bed mobility comments: HOB elevated, used bed rail, increased time  Transfers Overall transfer level: Needs assistance Equipment used: None Transfers: Sit to/from Stand Sit to Stand: Min assist         General transfer comment: increased time, minA for power up to achieve full upright posture  Ambulation/Gait Ambulation/Gait assistance: Min assist Ambulation Distance (Feet): 125 Feet Assistive device:  (pushed IV pole) Gait Pattern/deviations: Step-through pattern Gait velocity: slow   General Gait Details: decreased pace, 1 standing rest break due to poor endurance, SpO2 >97% on RA   Stairs            Wheelchair Mobility    Modified Rankin (Stroke Patients Only)       Balance Overall balance assessment: Needs assistance Sitting-balance support: Single extremity supported;Feet supported Sitting balance-Leahy Scale: Fair Sitting balance - Comments: pt with poor  endurance, pt leaned onto elevated HOB while waiting to take pills     Standing balance-Leahy Scale: Fair Standing balance comment: benefits from UE support during amb                            Cognition Arousal/Alertness: Awake/alert Behavior During Therapy: WFL for tasks assessed/performed Overall Cognitive Status: Within Functional Limits for tasks assessed                                        Exercises      General Comments        Pertinent Vitals/Pain Pain Assessment: 0-10 Pain Score: 3  Pain Location: headache Pain Descriptors / Indicators: Headache    Home Living                      Prior Function            PT Goals (current goals can now be found in the care plan section) Acute Rehab PT Goals Patient Stated Goal: home Progress towards PT goals: Progressing toward goals    Frequency    Min 3X/week      PT Plan Current plan remains appropriate    Co-evaluation              AM-PAC PT "6 Clicks" Daily Activity  Outcome Measure  Difficulty turning over in bed (including adjusting bedclothes, sheets and blankets)?:  A Little Difficulty moving from lying on back to sitting on the side of the bed? : A Little Difficulty sitting down on and standing up from a chair with arms (e.g., wheelchair, bedside commode, etc,.)?: A Little Help needed moving to and from a bed to chair (including a wheelchair)?: A Little Help needed walking in hospital room?: A Little Help needed climbing 3-5 steps with a railing? : A Little 6 Click Score: 18    End of Session Equipment Utilized During Treatment: Gait belt Activity Tolerance: Patient tolerated treatment well Patient left: in chair;with call bell/phone within reach (respiratory therapist) Nurse Communication: Mobility status PT Visit Diagnosis: Unsteadiness on feet (R26.81);Muscle weakness (generalized) (M62.81)     Time: 5916-3846 PT Time Calculation (min) (ACUTE  ONLY): 29 min  Charges:  $Gait Training: 23-37 mins                    G Codes:       Kittie Plater, PT, DPT Pager #: [phone] Office #: [phone]    [place] 08/01/2016, 8:53 AM

## 2016-08-01 NOTE — Progress Notes (Addendum)
Progress Note  Patient Name: [name] Date of Encounter: 08/01/2016  Primary Cardiologist: New- Dr Debara Pickett  Subjective   The patient denies chest pain or SOB, he has mild headache. He denies palpitations or dizziness.   Inpatient Medications    Scheduled Meds: . allopurinol  100 mg Oral Daily  . amLODipine  10 mg Oral Daily  . aspirin  81 mg Oral Daily  . carvedilol  25 mg Oral BID WC  . cholecalciferol  2,000 Units Oral Daily  . doxazosin  4 mg Oral BID  . insulin aspart  0-9 Units Subcutaneous TID WC  . insulin aspart protamine- aspart  5 Units Subcutaneous BID WC  . isosorbide-hydrALAZINE  2 tablet Oral TID  . levothyroxine  100 mcg Oral QAC breakfast  . lubiprostone  24 mcg Oral BID  . rosuvastatin  10 mg Oral QPM  . sodium bicarbonate  650 mg Oral BID  . sodium chloride flush  3 mL Intravenous Q12H  . tamsulosin  0.8 mg Oral QPM   Continuous Infusions: . sodium chloride 250 mL (07/29/16 2130)  . heparin 1,400 Units/hr (07/31/16 2013)  . nitroGLYCERIN Stopped (07/30/16 1926)   PRN Meds: sodium chloride, acetaminophen, hydrALAZINE, nitroGLYCERIN, ondansetron (ZOFRAN) IV, sodium chloride flush   Vital Signs    Vitals:   07/31/16 2050 08/01/16 0441 08/01/16 0450 08/01/16 0759  BP: (!) 146/68  (!) 159/62   Pulse: (!) 59   69  Resp: 14   18  Temp: 98.5 F (36.9 C)  98.2 F (36.8 C) 98.1 F (36.7 C)  TempSrc: Oral  Oral Oral  SpO2: 99%  100% 99%  Weight:  205 lb (93 kg)    Height:        Intake/Output Summary (Last 24 hours) at 08/01/16 0954 Last data filed at 08/01/16 0454  Gross per 24 hour  Intake            983.6 ml  Output             1650 ml  Net           -666.4 ml   Filed Weights   07/30/16 0426 07/31/16 0435 08/01/16 0441  Weight: 204 lb 9.6 oz (92.8 kg) 203 lb 14.4 oz (92.5 kg) 205 lb (93 kg)    Telemetry    1st degree AVB, rates in the 60's. 11 beats of NSVT this am, 4 beats last evening.  - Personally Reviewed  ECG    No new  tracings  Physical Exam   GEN: No acute distress.   Neck: No JVD Cardiac: RRR, 2/6 systolic murmurs, rubs, or gallops.  Respiratory: Clear to auscultation bilaterally. GI: Soft, nontender, non-distended  MS: No edema; No deformity. Neuro:  Nonfocal  Psych: Normal affect   Labs    Chemistry Recent Labs Lab 07/30/16 0531 07/31/16 0505 08/01/16 0548  NA 134* 136 133*  K 3.2* 3.4* 3.5  CL 99* 101 100*  CO2 22 24 23   GLUCOSE 242* 224* 182*  BUN 56* 52* 52*  CREATININE 3.18* 3.30* 3.35*  CALCIUM 8.8* 8.9 8.9  GFRNONAA 18* 17* 17*  GFRAA 21* 20* 20*  ANIONGAP 13 11 10      Hematology Recent Labs Lab 07/30/16 0531 07/31/16 0505 08/01/16 0548  WBC 5.2 6.5 6.6  RBC 3.00* 3.02* 2.97*  HGB 8.7* 8.7* 8.6*  HCT 26.7* 27.1* 26.6*  MCV 89.0 89.7 89.6  MCH 29.0 28.8 29.0  MCHC 32.6 32.1 32.3  RDW 13.7  14.3 14.2  PLT 205 233 239    Cardiac Enzymes Recent Labs Lab 07/26/16 0022 07/26/16 1303 07/26/16 1832 07/27/16 0055  TROPONINI 13.49* 8.27* 7.06* 7.89*   No results for input(s): TROPIPOC in the last 168 hours.   BNPNo results for input(s): BNP, PROBNP in the last 168 hours.   DDimer No results for input(s): DDIMER in the last 168 hours.   Radiology    No results found.  Cardiac Studies   Left Heart Cath and Coronary Angiography 07/28/16  Conclusion     LM lesion, 75 %stenosed.  Ost Cx lesion, 60 %stenosed.  Ost LAD lesion, 60 %stenosed.  Mid LAD lesion, 90 %stenosed.  Ost 2nd Diag to 2nd Diag lesion, 95 %stenosed.  Mid Cx to Dist Cx lesion, 25 %stenosed.  Prox RCA to Mid RCA lesion, 100 %stenosed.  LV end diastolic pressure is moderately elevated.  There is no aortic valve stenosis.  No ventriculogram done to minimize contrast exposure.   Severe multivessel CAD.  Plan for cardiac surgery consult.  Continue diuresis as renal function allows.     __ Diagnostic Diagram         ________________________________________________________________________  Echo 07/25/16 Study Conclusions  - Left ventricle: The cavity size was normal. There was moderate   concentric hypertrophy. Systolic function was mildly to   moderately reduced. The estimated ejection fraction was in the   range of 40% to 45%. Mid to distal inferior, apical, lateral and   apical septal severe hypokinesis. Doppler parameters are   consistent with abnormal left ventricular relaxation (grade 1   diastolic dysfunction). The E/e&' ratio is >15, suggesting   elevated LV filling pressure. - Aortic valve: Sclerosis without stenosis. There was no   regurgitation. - Mitral valve: Mildly thickened leaflets . There was trivial   regurgitation. - Left atrium: The atrium was normal in size. - Tricuspid valve: There was mild regurgitation. - Pulmonary arteries: PA peak pressure: 40 mm Hg (S). - Inferior vena cava: The vessel was dilated. The respirophasic   diameter changes were blunted (< 50%), consistent with elevated   central venous pressure.  Impressions:  - Compared to a prior study in 2014, the EF is lower at 40-45% with   regional wall motion abnormalities possibly in the LCX territory   distribution, there is diastolic dysfunction with elevated LV   filling pressure, mild pulmonary hypertension and no pericardial   effusion.   Patient Profile     71 y.o. male with a past medical history significant for hypertension, IDDM, BPH, prior herniated disk in 2005 sp L3/L4 diskectomy and fusion, mild bilateral ICA stenosis in 2014 and EF 60-65% with moderate LVH by echo in 2014, there was focal distal anterior and anteroseptal thinning and akinesis, diastolic dysfunction and mild to moderate pulmonary hypertension at the time.  CKD with creatinine of 2.65 in 2015 (now 3.83) -sees Dr. Hinda Lenis in [place]. Presented to [facility] with chest pain and progressive dyspnea by EMS. Required BIPAP. Troponin  T of 0.26, BNP of 19,808, anemia with H/H 9.1/27.5, glucose of 261, no leukocytosis. CXR shows mild cardiomegaly, CHF/consolidation   Assessment & Plan    CAD- non-STEMI -Max troponin 13.49 -Severe multivessel CAD per left heart cath on 07/28/16. Planned CABG on 6/22. Optimizing medications.  -Off nitroglycerin drip. No further chest pain -On BB, Bidil, statin, aspirin. IV heparin infusing -Pt has been having NSVT, likely ischemic.  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  BNP 19k on admission.  -Was on lasix 80  mg, now on hold due to renal function.  -Fluid status - euvolemic  Hypertension -Blood pressures still elevated 140s-160's/60-70's -Currently on Norvasc 10 mg, coreg 25 mg, Cardura 4 mg bid, Bidil 20-37.5 mg TID,   CKD stage IV -SCr 3.9 on admit, trended down to 3.18 on 6/16, trending back up, today- 3.35. -last dose of lasix was yesterday. Will continue to hold.   Anemia -Hgb 8.6, stable   Signed, Daune Perch, NP  08/01/2016, 9:54 AM    The patient was seen, examined and discussed with Daune Perch, NP-C and I agree with the above.   71 year old male post NSTEMI, 3 VD awaiting CABG on 6/22, asymptomatic, BP remains high despite multiple medications, we are unable to add ACEI/ARB sec to CKD stage 4. I will increase cardura to 8 mg po daily. He has mild headache, I will give tylenol 500 mg po x 1. There are nsVTs on telemetry up to 11 beats, those asymptomatic. The patient is on max dose of carvedilol already and is bradycardic at baseline.  Ena Dawley, MD 08/01/2016

## 2016-08-01 NOTE — Plan of Care (Signed)
Problem: Pain Managment: Goal: General experience of comfort will improve Outcome: Progressing Requests PRN tylenol and hot packs for management of head and back pain

## 2016-08-01 NOTE — Progress Notes (Signed)
Inpatient Diabetes Program Recommendations  AACE/ADA: New Consensus Statement on Inpatient Glycemic Control (2015)  Target Ranges:  Prepandial:   less than 140 mg/dL      Peak postprandial:   less than 180 mg/dL (1-2 hours)      Critically ill patients:  140 - 180 mg/dL   Results for [name], [name] (MRN [id number]) as of 08/01/2016 11:25  Ref. Range 07/31/2016 07:57 07/31/2016 11:25 07/31/2016 16:24 07/31/2016 23:23 08/01/2016 07:32  Glucose-Capillary Latest Ref Range: 65 - 99 mg/dL 251 (H) 207 (H) 218 (H) 185 (H) 194 (H)    Home DM Meds: Humulin 70/30 Insulin: 5-10 units BID  Current Insulin Orders: 70/30 Insulin: 5 units BID        Novolog Sensitive Correction Scale/ SSI (0-9 units) TID AC        MD- Please consider increasing 70/30 Insulin to 8 units BID with meals     --Will follow patient during hospitalization--  Wyn Quaker RN, MSN, CDE Diabetes Coordinator Inpatient Glycemic Control Team Team Pager: [phone] (8a-5p)

## 2016-08-01 NOTE — Progress Notes (Signed)
ANTICOAGULATION CONSULT NOTE - Follow Up Consult  Pharmacy Consult for Heparin Indication: ACS/STEMI  No Known Allergies  Patient Measurements: Height: 6\' 1"  (185.4 cm) Weight: 205 lb (93 kg) IBW/kg (Calculated) : 79.9 Heparin Dosing Weight: 92.8 kg  Vital Signs: Temp: 98.1 F (36.7 C) (06/18 0759) Temp Source: Oral (06/18 0759) BP: 159/62 (06/18 0450) Pulse Rate: 69 (06/18 0759)  Labs:  Recent Labs  07/30/16 0531  07/31/16 0021 07/31/16 0505 08/01/16 0548  HGB 8.7*  --   --  8.7* 8.6*  HCT 26.7*  --   --  27.1* 26.6*  PLT 205  --   --  233 239  HEPARINUNFRC 0.75*  < > 0.58 0.55 0.58  CREATININE 3.18*  --   --  3.30* 3.35*  < > = values in this interval not displayed.  Estimated Creatinine Clearance: 22.9 mL/min (A) (by C-G formula based on SCr of 3.35 mg/dL (H)).   Assessment: 71 yo male on heparin infusion for severe CAD pending CABG on 6/22. CBC stable.   Goal of Therapy:  Heparin level 0.3-0.7 units/ml Monitor platelets by anticoagulation protocol: Yes   Plan:  1. Continue heparin gtt at 1400 units/hr 2. Daily heparin level and CBC 3. Monitor for s/s bleeding   Vincenza Hews, PharmD, BCPS 08/01/2016, 10:32 AM

## 2016-08-01 NOTE — Care Management Important Message (Signed)
Important Message  Patient Details  Name: [name] MRN: [id number] Date of Birth: 1946-02-08   Medicare Important Message Given:  Yes    Nathen May 08/01/2016, 1:39 PM

## 2016-08-01 NOTE — Progress Notes (Signed)
CARDIAC REHAB PHASE I   PRE:  Rate/Rhythm: 68 SR    BP: sitting 145/67    SaO2: 98 RA  MODE:  Ambulation: 100 ft   POST:  Rate/Rhythm: 77 SR    BP: sitting 149/71     SaO2: 100 RA  Pt able to get out of chair and ambulate pushing IV pole. Pt with slow gait, I held gait belt. I asked pt to turn around before he fatigued too far from room, pt seemed to struggle to know how far his body would allow. C/o fatigue with distance. To bed, VSS. He practiced IS, doing well, 2000 mL. Will f/u. [place], ACSM 08/01/2016 2:08 PM

## 2016-08-02 DIAGNOSIS — I2510 Atherosclerotic heart disease of native coronary artery without angina pectoris: Secondary | ICD-10-CM

## 2016-08-02 LAB — BASIC METABOLIC PANEL
ANION GAP: 11 (ref 5–15)
BUN: 52 mg/dL — ABNORMAL HIGH (ref 6–20)
CALCIUM: 8.9 mg/dL (ref 8.9–10.3)
CHLORIDE: 101 mmol/L (ref 101–111)
CO2: 21 mmol/L — AB (ref 22–32)
Creatinine, Ser: 3.2 mg/dL — ABNORMAL HIGH (ref 0.61–1.24)
GFR calc Af Amer: 21 mL/min — ABNORMAL LOW (ref >60)
GFR calc non Af Amer: 18 mL/min — ABNORMAL LOW (ref >60)
Glucose, Bld: 253 mg/dL — ABNORMAL HIGH (ref 65–99)
POTASSIUM: 3.7 mmol/L (ref 3.5–5.1)
Sodium: 133 mmol/L — ABNORMAL LOW (ref 135–145)

## 2016-08-02 LAB — CBC
HEMATOCRIT: 26.7 % — AB (ref 39.0–52.0)
HEMOGLOBIN: 8.5 g/dL — AB (ref 13.0–17.0)
MCH: 29.2 pg (ref 26.0–34.0)
MCHC: 31.8 g/dL (ref 30.0–36.0)
MCV: 91.8 fL (ref 78.0–100.0)
Platelets: 224 10*3/uL (ref 150–400)
RBC: 2.91 MIL/uL — AB (ref 4.22–5.81)
RDW: 14.5 % (ref 11.5–15.5)
WBC: 7.1 10*3/uL (ref 4.0–10.5)

## 2016-08-02 LAB — GLUCOSE, CAPILLARY
GLUCOSE-CAPILLARY: 244 mg/dL — AB (ref 65–99)
GLUCOSE-CAPILLARY: 129 mg/dL — AB (ref 65–99)
GLUCOSE-CAPILLARY: 174 mg/dL — AB (ref 65–99)
Glucose-Capillary: 165 mg/dL — ABNORMAL HIGH (ref 65–99)

## 2016-08-02 LAB — HEPARIN LEVEL (UNFRACTIONATED): Heparin Unfractionated: 0.6 IU/mL (ref 0.30–0.70)

## 2016-08-02 MED ORDER — ZOLPIDEM TARTRATE 5 MG PO TABS
5.0000 mg | ORAL_TABLET | Freq: Every evening | ORAL | Status: DC | PRN
  Administered 2016-08-02 – 2016-08-04 (×3): 5 mg via ORAL
  Filled 2016-08-02 (×3): qty 1

## 2016-08-02 MED ORDER — PANTOPRAZOLE SODIUM 40 MG PO TBEC
40.0000 mg | DELAYED_RELEASE_TABLET | Freq: Every day | ORAL | Status: DC
  Administered 2016-08-02 – 2016-08-04 (×3): 40 mg via ORAL
  Filled 2016-08-02 (×3): qty 1

## 2016-08-02 NOTE — Progress Notes (Signed)
CARDIAC REHAB PHASE I   PRE:  Rate/Rhythm: 90 SR  BP:  Sitting: 161/73        SaO2: 100 RA  MODE:  Ambulation: 140 ft   POST:  Rate/Rhythm: 72 SR  BP:  Sitting: 171/71         SaO2: 98 RA  Pt ambulated 140 ft on RA, rolling walker, IV, assist x1, slow, steady gait, tolerated well with no complaints. Pt to bed per pt request after walk, call bell within reach. Will follow.   [place], RN, BSN 08/02/2016 2:04 PM

## 2016-08-02 NOTE — Plan of Care (Signed)
Problem: Pain Managment: Goal: General experience of comfort will improve Outcome: Progressing No pain at present

## 2016-08-02 NOTE — Progress Notes (Signed)
Patient ID: [name], male   DOB: 29-Jul-1945, 71 y.o.   MRN: [id number]      [address]       [place] [address]             [phone]                 5 Days Post-Op Procedure(s) (LRB): Left Heart Cath and Coronary Angiography (N/A)  LOS: 7 days   Subjective: Patient comfortable.  Objective: Vital signs in last 24 hours: Patient Vitals for the past 24 hrs:  BP Temp Temp src Pulse Resp SpO2 Weight  08/02/16 1612 (!) 166/62 98.3 F (36.8 C) Oral 68 10 100 % -  08/02/16 1132 (!) 149/64 98.6 F (37 C) Oral 67 19 100 % -  08/02/16 0752 (!) 168/63 97.9 F (36.6 C) Oral 68 12 98 % -  08/02/16 0457 (!) 142/89 97.8 F (36.6 C) Oral 69 19 100 % -  08/02/16 0328 - - - - - - 205 lb 4.8 oz (93.1 kg)  08/02/16 0009 (!) 163/63 98 F (36.7 C) Oral 76 14 100 % -  08/01/16 1951 (!) 150/61 98.5 F (36.9 C) Oral 62 16 100 % -  08/01/16 1758 (!) 137/48 98.4 F (36.9 C) Oral 71 14 100 % -    Filed Weights   07/31/16 0435 08/01/16 0441 08/02/16 0328  Weight: 203 lb 14.4 oz (92.5 kg) 205 lb (93 kg) 205 lb 4.8 oz (93.1 kg)    Hemodynamic parameters for last 24 hours:    Intake/Output from previous day: 06/18 0701 - 06/19 0700 In: 1325.4 [P.O.:960; I.V.:365.4] Out: 950 [Urine:950] Intake/Output this shift: Total I/O In: 220 [P.O.:220] Out: 500 [Urine:500]  Scheduled Meds: . allopurinol  100 mg Oral Daily  . amLODipine  10 mg Oral Daily  . aspirin  81 mg Oral Daily  . carvedilol  25 mg Oral BID WC  . cholecalciferol  2,000 Units Oral Daily  . doxazosin  8 mg Oral BID  . insulin aspart  0-9 Units Subcutaneous TID WC  . insulin aspart protamine- aspart  5 Units Subcutaneous BID WC  . isosorbide-hydrALAZINE  2 tablet Oral TID  . levothyroxine  100 mcg Oral QAC breakfast  . lubiprostone  24 mcg Oral BID  . pantoprazole  40 mg Oral Q1200  . rosuvastatin  10 mg Oral QPM  . sodium bicarbonate  650 mg Oral BID  . sodium chloride flush  3 mL Intravenous Q12H    . tamsulosin  0.8 mg Oral QPM   Continuous Infusions: . sodium chloride 250 mL (07/29/16 2130)  . heparin 1,400 Units/hr (08/01/16 1610)  . nitroGLYCERIN Stopped (07/30/16 1926)   PRN Meds:.sodium chloride, acetaminophen, hydrALAZINE, nitroGLYCERIN, ondansetron (ZOFRAN) IV, sodium chloride flush, zolpidem  General appearance: alert and cooperative Neurologic: intact Heart: regular rate and rhythm, S1, S2 normal, no murmur, click, rub or gallop Lungs: clear to auscultation bilaterally Abdomen: soft, non-tender; bowel sounds normal; no masses,  no organomegaly Extremities: extremities normal, atraumatic, no cyanosis or edema and Homans sign is negative, no sign of DVT Wound: cath site ok  Lab Results: CBC: Recent Labs  08/01/16 0548 08/02/16 0619  WBC 6.6 7.1  HGB 8.6* 8.5*  HCT 26.6* 26.7*  PLT 239 224   BMET:  Recent Labs  08/01/16 0548 08/02/16 0619  NA 133* 133*  K 3.5 3.7  CL 100* 101  CO2 23 21*  GLUCOSE 182* 253*  BUN 52*  52*  CREATININE 3.35* 3.20*  CALCIUM 8.9 8.9    PT/INR: No results for input(s): LABPROT, INR in the last 72 hours.   Radiology No results found.  Chronic Kidney Disease   Stage I     GFR >90  Stage II    GFR 60-89  Stage IIIA GFR 45-59  Stage IIIB GFR 30-44  Stage IV   GFR 15-29  Stage V    GFR  <15  Lab Results  Component Value Date   CREATININE 3.20 (H) 08/02/2016   Estimated Creatinine Clearance: 23.9 mL/min (A) (by C-G formula based on SCr of 3.2 mg/dL (H)).  Assessment/Plan: S/P Procedure(s) (LRB): Left Heart Cath and Coronary Angiography (N/A) Remains with elevated cr, Stage IV 3.2 today  Discussed with patient proceeding with CABG Friday  Needs better glucose control preop, per cardiology or if they prefer can consult medicine  Glucose 253 today Plan cabg Friday, discussed with patient who will let his family know.  [name] MD 08/02/2016 5:40 PM

## 2016-08-02 NOTE — Progress Notes (Signed)
Progress Note  Patient Name: [name] Date of Encounter: 08/02/2016  Primary Cardiologist: New- Dr Debara Pickett  Subjective   The patient denies chest pain or SOB or orthopnea. He denies palpitations or dizziness. He did not sleep well last night and would like something to help. States that he takes something at home but does not know what it is.  Inpatient Medications    Scheduled Meds: . allopurinol  100 mg Oral Daily  . amLODipine  10 mg Oral Daily  . aspirin  81 mg Oral Daily  . carvedilol  25 mg Oral BID WC  . cholecalciferol  2,000 Units Oral Daily  . doxazosin  8 mg Oral BID  . insulin aspart  0-9 Units Subcutaneous TID WC  . insulin aspart protamine- aspart  5 Units Subcutaneous BID WC  . isosorbide-hydrALAZINE  2 tablet Oral TID  . levothyroxine  100 mcg Oral QAC breakfast  . lubiprostone  24 mcg Oral BID  . rosuvastatin  10 mg Oral QPM  . sodium bicarbonate  650 mg Oral BID  . sodium chloride flush  3 mL Intravenous Q12H  . tamsulosin  0.8 mg Oral QPM   Continuous Infusions: . sodium chloride 250 mL (07/29/16 2130)  . heparin 1,400 Units/hr (08/01/16 1610)  . nitroGLYCERIN Stopped (07/30/16 1926)   PRN Meds: sodium chloride, acetaminophen, hydrALAZINE, nitroGLYCERIN, ondansetron (ZOFRAN) IV, sodium chloride flush   Vital Signs    Vitals:   08/02/16 0009 08/02/16 0328 08/02/16 0457 08/02/16 0752  BP: (!) 163/63  (!) 142/89 (!) 168/63  Pulse: 76  69 68  Resp: 14  19 12   Temp: 98 F (36.7 C)  97.8 F (36.6 C) 97.9 F (36.6 C)  TempSrc: Oral  Oral Oral  SpO2: 100%  100% 98%  Weight:  205 lb 4.8 oz (93.1 kg)    Height:        Intake/Output Summary (Last 24 hours) at 08/02/16 0923 Last data filed at 08/02/16 0700  Gross per 24 hour  Intake           1085.4 ml  Output              950 ml  Net            135.4 ml   Filed Weights   07/31/16 0435 08/01/16 0441 08/02/16 0328  Weight: 203 lb 14.4 oz (92.5 kg) 205 lb (93 kg) 205 lb 4.8 oz (93.1 kg)     Telemetry    1st degree AVB, rates in the 60's.  - Personally Reviewed  ECG    No new tracings  Physical Exam   GEN: No acute distress.   Neck: No JVD Cardiac: RRR, 2/6 systolic murmurs and LUSB, No rubs, or gallops.  Respiratory: Clear to auscultation bilaterally. GI: Soft, nontender, non-distended  MS: No edema; No deformity. Neuro:  Nonfocal  Psych: Normal affect   Labs    Chemistry  Recent Labs Lab 07/31/16 0505 08/01/16 0548 08/02/16 0619  NA 136 133* 133*  K 3.4* 3.5 3.7  CL 101 100* 101  CO2 24 23 21*  GLUCOSE 224* 182* 253*  BUN 52* 52* 52*  CREATININE 3.30* 3.35* 3.20*  CALCIUM 8.9 8.9 8.9  GFRNONAA 17* 17* 18*  GFRAA 20* 20* 21*  ANIONGAP 11 10 11      Hematology  Recent Labs Lab 07/31/16 0505 08/01/16 0548 08/02/16 0619  WBC 6.5 6.6 7.1  RBC 3.02* 2.97* 2.91*  HGB 8.7* 8.6* 8.5*  HCT 27.1* 26.6* 26.7*  MCV 89.7 89.6 91.8  MCH 28.8 29.0 29.2  MCHC 32.1 32.3 31.8  RDW 14.3 14.2 14.5  PLT 233 239 224    Cardiac Enzymes  Recent Labs Lab 07/26/16 1303 07/26/16 1832 07/27/16 0055  TROPONINI 8.27* 7.06* 7.89*   No results for input(s): TROPIPOC in the last 168 hours.   BNPNo results for input(s): BNP, PROBNP in the last 168 hours.   DDimer No results for input(s): DDIMER in the last 168 hours.   Radiology    No results found.  Cardiac Studies   Left Heart Cath and Coronary Angiography 07/28/16  Conclusion     LM lesion, 75 %stenosed.  Ost Cx lesion, 60 %stenosed.  Ost LAD lesion, 60 %stenosed.  Mid LAD lesion, 90 %stenosed.  Ost 2nd Diag to 2nd Diag lesion, 95 %stenosed.  Mid Cx to Dist Cx lesion, 25 %stenosed.  Prox RCA to Mid RCA lesion, 100 %stenosed.  LV end diastolic pressure is moderately elevated.  There is no aortic valve stenosis.  No ventriculogram done to minimize contrast exposure.   Severe multivessel CAD.  Plan for cardiac surgery consult.  Continue diuresis as renal function allows.      __ Diagnostic Diagram        ________________________________________________________________________  Echo 07/25/16 Study Conclusions  - Left ventricle: The cavity size was normal. There was moderate   concentric hypertrophy. Systolic function was mildly to   moderately reduced. The estimated ejection fraction was in the   range of 40% to 45%. Mid to distal inferior, apical, lateral and   apical septal severe hypokinesis. Doppler parameters are   consistent with abnormal left ventricular relaxation (grade 1   diastolic dysfunction). The E/e&' ratio is >15, suggesting   elevated LV filling pressure. - Aortic valve: Sclerosis without stenosis. There was no   regurgitation. - Mitral valve: Mildly thickened leaflets . There was trivial   regurgitation. - Left atrium: The atrium was normal in size. - Tricuspid valve: There was mild regurgitation. - Pulmonary arteries: PA peak pressure: 40 mm Hg (S). - Inferior vena cava: The vessel was dilated. The respirophasic   diameter changes were blunted (< 50%), consistent with elevated   central venous pressure.  Impressions:  - Compared to a prior study in 2014, the EF is lower at 40-45% with   regional wall motion abnormalities possibly in the LCX territory   distribution, there is diastolic dysfunction with elevated LV   filling pressure, mild pulmonary hypertension and no pericardial   effusion.   Patient Profile     71 y.o. male with a past medical history significant for hypertension, IDDM, BPH, prior herniated disk in 2005 sp L3/L4 diskectomy and fusion, mild bilateral ICA stenosis in 2014 and EF 60-65% with moderate LVH by echo in 2014, there was focal distal anterior and anteroseptal thinning and akinesis, diastolic dysfunction and mild to moderate pulmonary hypertension at the time.  CKD with creatinine of 2.65 in 2015 (now 3.83) -sees Dr. Hinda Lenis in [place]. Presented to [facility] with chest pain and progressive dyspnea  by EMS. Required BIPAP. Troponin T of 0.26, BNP of 19,808, anemia with H/H 9.1/27.5, glucose of 261, no leukocytosis. CXR shows mild cardiomegaly, CHF/consolidation   Assessment & Plan    CAD- non-STEMI -Max troponin 13.49 -Severe multivessel CAD per left heart cath on 07/28/16. Planned CABG on 6/22. Optimizing medications.  -Off nitroglycerin drip. No further chest pain -On Beta blocker, Bidil, statin, aspirin. IV heparin infusing -Pt  has been having NSVT, likely ischemic, none since yesterday am. On max dose carvedilol and HR in 60's at baseline.  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  BNP 19k on admission.  -Was on lasix 80 mg, now on hold due to renal function. Patient is -6 L since admission. Max weight was 213 pounds, now is stable at 205 pounds -Fluid status - euvolemic, no edema or orthopnea  Hypertension -Blood pressures still elevated 140s-160's/60-70's -Currently on Norvasc 10 mg, coreg 25 mg BID, Bidil 20-37.5 mg TID, Cardura was increased to 8 mg bid yesterday. Continue to monitor -Unable to add ACEI/ARB due to CKD stage 4  CKD stage IV -SCr 3.9 on admit, trended down to 3.18 on 6/16, now stable at 3.20. -last dose of lasix was 6/16. Will continue to hold.   Anemia -Hgb 8.5, stable  Signed, Daune Perch, NP  08/02/2016, 9:23 AM    The patient was seen, examined and discussed with Daune Perch, NP-C and I agree with the above.   71 year old male post NSTEMI, 3 VD awaiting CABG on 6/22, asymptomatic, BP remains high despite multiple medications, we are unable to add ACEI/ARB sec to CKD stage 4. I will increase cardura to 8 mg po daily. He has mild headache, I will give tylenol 500 mg po x 1. nsVTs on telemetry have resolved, he feels indigestion today, I will start protonix 40 mg po daily.  BP still elevated, but increased dose of cardura started just today.  Ena Dawley, MD 08/02/2016

## 2016-08-02 NOTE — Progress Notes (Signed)
Physical Therapy Treatment Patient Details Name: [name] MRN: [id number] DOB: 09/14/1945 Today's Date: 08/02/2016    History of Present Illness 71 year old male w/ acute on chronic heart failure, c/b CRI stage IV. Admitted w/ resultant pulmonary edema. As of 6/13, moved to ICU for closer observation, aggressive diuresis and NIPPV. 71 y.o. male admitted on 07/25/16 for SOB and chest pain.  Dx with NSTEMI and severe multi vessel CAD per L heart cath 07/28/16.  Plan for CABG 6/22.  Pt has had runs of NSVT (likely due to ischemia). Pt with significant PMH of HTN, DM, CKD, and lumbar fusion.      PT Comments    Pt continues to be limited by general weakness and activity intolerance.  DOE 2/4 O2 on RA 94% and HR in the 80s during gait with no NSVT during our session.  Pt's gait speed is very slow.  Education initiated re: post op mobility course and precautions.  PT will continue to follow acutely.    Follow Up Recommendations  Home health PT;Supervision/Assistance - 24 hour     Equipment Recommendations  Rolling walker with 5" wheels;3in1 (PT)    Recommendations for Other Services   NA     Precautions / Restrictions Precautions Precautions: Fall Precaution Comments: Pt unsteady on his feet, prefers RW    Mobility  Bed Mobility Overal bed mobility: Needs Assistance Bed Mobility: Supine to Sit;Sit to Supine     Supine to sit: Modified independent (Device/Increase time);HOB elevated Sit to supine: Modified independent (Device/Increase time)   General bed mobility comments: Pt able to get EOB by himself with increased time and effort.   Transfers Overall transfer level: Needs assistance Equipment used: Rolling walker (2 wheeled) Transfers: Sit to/from Stand Sit to Stand: Min guard         General transfer comment: Min guard assist for safety and balance to get to standing without RW, Pt, once standing, reported he preferred walking with the RW for support.    Ambulation/Gait Ambulation/Gait assistance: Min guard Ambulation Distance (Feet): 180 Feet Assistive device: Rolling walker (2 wheeled) Gait Pattern/deviations: Step-through pattern Gait velocity: 0.79 ft/sec (very slow) Gait velocity interpretation: <1.8 ft/sec, indicative of risk for recurrent falls General Gait Details: very slow pace, 2/4 DOE despite good HR and sats.            Balance Overall balance assessment: Needs assistance Sitting-balance support: Feet supported;No upper extremity supported Sitting balance-Leahy Scale: Good     Standing balance support: Bilateral upper extremity supported;Single extremity supported Standing balance-Leahy Scale: Fair                              Cognition Arousal/Alertness: Awake/alert Behavior During Therapy: WFL for tasks assessed/performed Overall Cognitive Status: Within Functional Limits for tasks assessed                                        Exercises Other Exercises Other Exercises: IS x 5 reps max inspired volume 2000 mL    General Comments General comments (skin integrity, edema, etc.): Spoke with pt re: post op course inculding early mobility, sternal precautions and importance of incentive spirometer (IS) use.        Pertinent Vitals/Pain Pain Assessment: No/denies pain           PT Goals (current goals can now  be found in the care plan section) Acute Rehab PT Goals Patient Stated Goal: to get stronger and return home.  Progress towards PT goals: Progressing toward goals    Frequency    Min 3X/week      PT Plan Current plan remains appropriate       AM-PAC PT "6 Clicks" Daily Activity  Outcome Measure  Difficulty turning over in bed (including adjusting bedclothes, sheets and blankets)?: A Little Difficulty moving from lying on back to sitting on the side of the bed? : A Little Difficulty sitting down on and standing up from a chair with arms (e.g., wheelchair,  bedside commode, etc,.)?: Total Help needed moving to and from a bed to chair (including a wheelchair)?: A Little Help needed walking in hospital room?: A Little Help needed climbing 3-5 steps with a railing? : A Little 6 Click Score: 16    End of Session   Activity Tolerance: Patient limited by fatigue Patient left: in bed;with call bell/phone within reach   PT Visit Diagnosis: Unsteadiness on feet (R26.81);Muscle weakness (generalized) (M62.81)     Time: 2423-5361 PT Time Calculation (min) (ACUTE ONLY): 17 min  Charges:  $Gait Training: 8-22 mins             Airyanna Dipalma B. [place], [place], DPT [phone]           08/02/2016, 6:28 PM

## 2016-08-02 NOTE — Progress Notes (Signed)
ANTICOAGULATION CONSULT NOTE - Follow Up Consult  Pharmacy Consult for Heparin Indication: ACS/STEMI  No Known Allergies  Patient Measurements: Height: 6\' 1"  (185.4 cm) Weight: 205 lb 4.8 oz (93.1 kg) IBW/kg (Calculated) : 79.9 Heparin Dosing Weight: 92.8 kg  Vital Signs: Temp: 97.9 F (36.6 C) (06/19 0752) Temp Source: Oral (06/19 0752) BP: 168/63 (06/19 0752) Pulse Rate: 68 (06/19 0752)  Labs:  Recent Labs  07/31/16 0505 08/01/16 0548 08/02/16 0619  HGB 8.7* 8.6* 8.5*  HCT 27.1* 26.6* 26.7*  PLT 233 239 224  HEPARINUNFRC 0.55 0.58 0.60  CREATININE 3.30* 3.35* 3.20*    Estimated Creatinine Clearance: 23.9 mL/min (A) (by C-G formula based on SCr of 3.2 mg/dL (H)).   Assessment: 71 yo male on heparin infusion for severe CAD pending CABG on 08/05/16. CBC stable.   Goal of Therapy:  Heparin level 0.3-0.7 units/ml Monitor platelets by anticoagulation protocol: Yes   Plan:  1. Continue heparin gtt at 1400 units/hr 2. Daily heparin level and CBC   Vincenza Hews, PharmD, BCPS 08/02/2016, 8:20 AM

## 2016-08-02 NOTE — Progress Notes (Signed)
Inpatient Diabetes Program Recommendations  AACE/ADA: New Consensus Statement on Inpatient Glycemic Control (2015)  Target Ranges:  Prepandial:   less than 140 mg/dL      Peak postprandial:   less than 180 mg/dL (1-2 hours)      Critically ill patients:  140 - 180 mg/dL   Results for ROSSI, BURDO (MRN [id number]) as of 08/02/2016 10:56  Ref. Range 08/01/2016 07:32 08/01/2016 11:27 08/01/2016 17:57 08/01/2016 19:54 08/02/2016 07:48  Glucose-Capillary Latest Ref Range: 65 - 99 mg/dL 194 (H) 179 (H) 227 (H) 205 (H) 244 (H)   Review of Glycemic Control  Current orders for Inpatient glycemic control: 70/30 5 units BID with meals, Novolog 0-9 units TID with meals  Inpatient Diabetes Program Recommendations: Insulin - Basal: Please consider increasing 70/30 to 8 units BID. Correction (SSI): Please consider ordering Novolog 0-5 units QHS for bedtime correction scale  Thanks, Barnie Alderman, RN, MSN, CDE Diabetes Coordinator Inpatient Diabetes Program [phone] (Team Pager from 8am to 5pm)

## 2016-08-03 LAB — BASIC METABOLIC PANEL
Anion gap: 11 (ref 5–15)
BUN: 49 mg/dL — ABNORMAL HIGH (ref 6–20)
CALCIUM: 9.1 mg/dL (ref 8.9–10.3)
CHLORIDE: 103 mmol/L (ref 101–111)
CO2: 22 mmol/L (ref 22–32)
Creatinine, Ser: 3.22 mg/dL — ABNORMAL HIGH (ref 0.61–1.24)
GFR calc Af Amer: 21 mL/min — ABNORMAL LOW (ref >60)
GFR calc non Af Amer: 18 mL/min — ABNORMAL LOW (ref >60)
GLUCOSE: 164 mg/dL — AB (ref 65–99)
Potassium: 3.6 mmol/L (ref 3.5–5.1)
Sodium: 136 mmol/L (ref 135–145)

## 2016-08-03 LAB — CBC
HCT: 27.1 % — ABNORMAL LOW (ref 39.0–52.0)
HEMOGLOBIN: 8.6 g/dL — AB (ref 13.0–17.0)
MCH: 28.8 pg (ref 26.0–34.0)
MCHC: 31.7 g/dL (ref 30.0–36.0)
MCV: 90.6 fL (ref 78.0–100.0)
Platelets: 262 10*3/uL (ref 150–400)
RBC: 2.99 MIL/uL — ABNORMAL LOW (ref 4.22–5.81)
RDW: 14.5 % (ref 11.5–15.5)
WBC: 7.6 10*3/uL (ref 4.0–10.5)

## 2016-08-03 LAB — GLUCOSE, CAPILLARY
GLUCOSE-CAPILLARY: 175 mg/dL — AB (ref 65–99)
GLUCOSE-CAPILLARY: 168 mg/dL — AB (ref 65–99)
GLUCOSE-CAPILLARY: 177 mg/dL — AB (ref 65–99)
Glucose-Capillary: 206 mg/dL — ABNORMAL HIGH (ref 65–99)

## 2016-08-03 LAB — HEPARIN LEVEL (UNFRACTIONATED): Heparin Unfractionated: 0.69 IU/mL (ref 0.30–0.70)

## 2016-08-03 MED ORDER — INSULIN ASPART 100 UNIT/ML ~~LOC~~ SOLN
0.0000 [IU] | Freq: Every day | SUBCUTANEOUS | Status: DC
  Administered 2016-08-03: 2 [IU] via SUBCUTANEOUS

## 2016-08-03 MED ORDER — INSULIN ASPART 100 UNIT/ML ~~LOC~~ SOLN
0.0000 [IU] | Freq: Three times a day (TID) | SUBCUTANEOUS | Status: DC
  Administered 2016-08-03 (×2): 2 [IU] via SUBCUTANEOUS
  Administered 2016-08-04: 1 [IU] via SUBCUTANEOUS
  Administered 2016-08-04: 2 [IU] via SUBCUTANEOUS

## 2016-08-03 MED ORDER — INSULIN ASPART 100 UNIT/ML ~~LOC~~ SOLN: 0.0000 [IU] | Freq: Every day | SUBCUTANEOUS | Status: DC

## 2016-08-03 MED ORDER — INSULIN ASPART PROT & ASPART (70-30 MIX) 100 UNIT/ML ~~LOC~~ SUSP
7.0000 [IU] | Freq: Every day | SUBCUTANEOUS | Status: DC
  Administered 2016-08-04: 7 [IU] via SUBCUTANEOUS
  Filled 2016-08-03 (×2): qty 10

## 2016-08-03 MED ORDER — INSULIN ASPART PROT & ASPART (70-30 MIX) 100 UNIT/ML ~~LOC~~ SUSP
6.0000 [IU] | Freq: Every day | SUBCUTANEOUS | Status: DC
  Administered 2016-08-03 – 2016-08-04 (×2): 6 [IU] via SUBCUTANEOUS
  Filled 2016-08-03: qty 10

## 2016-08-03 MED ORDER — INSULIN ASPART 100 UNIT/ML ~~LOC~~ SOLN: 0.0000 [IU] | Freq: Three times a day (TID) | SUBCUTANEOUS | Status: DC

## 2016-08-03 NOTE — Progress Notes (Signed)
ANTICOAGULATION CONSULT NOTE - Follow Up Consult  Pharmacy Consult for Heparin Indication: ACS/STEMI  No Known Allergies  Patient Measurements: Height: 6\' 1"  (185.4 cm) Weight: 203 lb 12.8 oz (92.4 kg) IBW/kg (Calculated) : 79.9 Heparin Dosing Weight: 92.8 kg  Vital Signs: Temp: 98.9 F (37.2 C) (06/20 0700) Temp Source: Oral (06/20 0700) BP: 158/53 (06/20 0410) Pulse Rate: 77 (06/20 0410)  Labs:  Recent Labs  08/01/16 0548 08/02/16 0619 08/03/16 0421  HGB 8.6* 8.5* 8.6*  HCT 26.6* 26.7* 27.1*  PLT 239 224 262  HEPARINUNFRC 0.58 0.60 0.69  CREATININE 3.35* 3.20* 3.22*    Estimated Creatinine Clearance: 23.8 mL/min (A) (by C-G formula based on SCr of 3.22 mg/dL (H)).   Assessment: 71 yo male on heparin infusion for severe CAD pending CABG on 08/05/16. Heparin level remains therapeutic but has trended up to 0.69 (goal 0.3 -0.7). CBC stable.   Goal of Therapy:  Heparin level 0.3-0.7 units/ml Monitor platelets by anticoagulation protocol: Yes   Plan:  1. Decrease heparin gtt slightly to 1300 units/hr 2. Daily heparin level and CBC   Vincenza Hews, PharmD, BCPS 08/03/2016, 9:27 AM

## 2016-08-03 NOTE — Progress Notes (Signed)
Inpatient Diabetes Program Recommendations  AACE/ADA: New Consensus Statement on Inpatient Glycemic Control (2015)  Target Ranges:  Prepandial:   less than 140 mg/dL      Peak postprandial:   less than 180 mg/dL (1-2 hours)      Critically ill patients:  140 - 180 mg/dL  Results for ANDREWS, TENER (MRN [id number]) as of 08/03/2016 10:59  Ref. Range 08/02/2016 07:48 08/02/2016 11:09 08/02/2016 16:15 08/02/2016 21:51 08/03/2016 07:38  Glucose-Capillary Latest Ref Range: 65 - 99 mg/dL 244 (H)  Novolog 3 units  70/30 5 units 165 (H)  Novolog 2 units 129 (H)  70/30 5 units 174 (H) 175 (H)  Novolog 2 units  70/30 5 units   Results for SRIKAR, CHIANG (MRN [id number]) as of 08/03/2016 10:59  Ref. Range 08/01/2016 07:32 08/01/2016 11:27 08/01/2016 17:57 08/01/2016 19:54  Glucose-Capillary Latest Ref Range: 65 - 99 mg/dL 194 (H)  Novolog 2 units  70/30 5 units 179 (H)  Novolog 2 units 227 (H)  Novolog 2 units  70/30 5 units 205 (H)   Review of Glycemic Control  Outpatient Diabetes medications: Humulin 70/30 Insulin: 5-10 units BID Current orders for Inpatient glycemic control: 70/30 5 units BID, Novolog 0-15 units TID with meals, Novolog 0-5 units QHS  Inpatient Diabetes Program Recommendations: Insulin - Basal: Please consider increasing 70/30 to 7 units QAM with breakfast and 70/30 6 units QPM with supper.  Insulin-Correction: Noted Novolog correction scale was increased to Moderate scale today. Recommend decreasing back down to sensitive scale as patient is sensitive to insulin and has chronic kidney disease. Anticipate small increase in 70/30 will be more beneficial to further improve glycemic control. HgbA1C: A1C 6.6% on 07/25/16 indicating good glycemic control with an average glucose equal to an average of 143 mg/dl over the past 2-3 months (A1C goal is less than 7%).  NOTE: Noted consult for Diabetes Coordinator due to poor glycemic control. Over the past 24 hours, glucose ranged from  129-244 mg/dl (fasting 244 mg/dl on 08/02/16 likely due to bedtime glucose of 205 mg/dl on 6/18 which was not corrected with bedtime Novolog correction; otherwise glucose 129-175 mg/dl). A1C 6.6% on 07/25/16 which is within A1C goal of less than 7%. Noted plan for CABG on 08/05/16 at which time patient will be placed on protocol and IV insulin will be initiated on morning of procedure.  Thanks, Barnie Alderman, RN, MSN, CDE Diabetes Coordinator Inpatient Diabetes Program [phone] (Team Pager from 8am to 5pm)

## 2016-08-03 NOTE — Plan of Care (Signed)
Problem: Pain Managment: Goal: General experience of comfort will improve Outcome: Progressing Assess pain routine and prn.    

## 2016-08-03 NOTE — Progress Notes (Signed)
CARDIAC REHAB PHASE I   PRE:  Rate/Rhythm: 70 SR    BP: sitting 181/69    SaO2:   MODE:  Ambulation: 200 ft   POST:  Rate/Rhythm: 93 SR    BP: sitting 174/65     SaO2:   Pt able to stand independently, assist x1 walking around foot of bed. Used RW for ambulating in hall, much steadier/stable with RW. Increased distance as well. No c/o. Return to recliner. Encouraged more walking and IS. [place], ACSM 08/03/2016 8:55 AM

## 2016-08-03 NOTE — Care Management Important Message (Signed)
Important Message  Patient Details  Name: [name] MRN: [id number] Date of Birth: 07-Dec-1945   Medicare Important Message Given:  Yes    Kendallyn Lippold Abena 08/03/2016, 10:10 AM

## 2016-08-03 NOTE — Care Management (Addendum)
Case Management Note Initial Note Started by Marvetta Gibbons RN, BSN 07-27-16  Unit 2W-Case Manager-- [place] coverage [phone]  Patient Details  Name: [name] MRN: [id number] Date of Birth: Jan 16, 1946  Subjective/Objective:  Pt admitted with acute on Chronic HF and NSTEMI from outside hospital - aggressive diuresis                 Action/Plan: PTA pt lived at home with spouse- plan for cardiac cath on 6/14- CM to follow for  d/c needs-- per PT eval recommendations for HHPT and DME-RW and 3n1- will need orders prior to discharge.   Expected Discharge Date:                         Expected Discharge Plan:     In-House Referral:     Discharge planning Services  CM Consult  Post Acute Care Choice:    Choice offered to:     DME Arranged:    DME Agency:     HH Arranged:    HH Agency:     Status of Service:  In process, will continue to follow  If discussed at Long Length of Stay Meetings, dates discussed:  08-02-16, 08-04-16  Discharge Disposition:   Additional Comments: 1620 08-03-16 Jacqlyn Krauss, RN,BSN [phone] Awaiting CABG 08-05-16 due to Cr. Pt continues on IV Heparin and Nitro gtt. PT/OT recommendations for Home once stable. Pt will be seen post CABG for further recommendations. CM will continue to monitor for disposition needs.

## 2016-08-03 NOTE — Progress Notes (Signed)
Progress Note  Patient Name: [name] Date of Encounter: 08/03/2016  Primary Cardiologist: New- Dr Debara Pickett  Subjective   The patient denies chest pain or SOB or orthopnea. He denies palpitations or dizziness. He has been up in the halls with out chest pain or shortness of breath. He slept better last night with ambien.   Inpatient Medications    Scheduled Meds: . allopurinol  100 mg Oral Daily  . amLODipine  10 mg Oral Daily  . aspirin  81 mg Oral Daily  . carvedilol  25 mg Oral BID WC  . cholecalciferol  2,000 Units Oral Daily  . doxazosin  8 mg Oral BID  . insulin aspart  0-9 Units Subcutaneous TID WC  . insulin aspart protamine- aspart  5 Units Subcutaneous BID WC  . isosorbide-hydrALAZINE  2 tablet Oral TID  . levothyroxine  100 mcg Oral QAC breakfast  . lubiprostone  24 mcg Oral BID  . pantoprazole  40 mg Oral Q1200  . rosuvastatin  10 mg Oral QPM  . sodium bicarbonate  650 mg Oral BID  . sodium chloride flush  3 mL Intravenous Q12H  . tamsulosin  0.8 mg Oral QPM   Continuous Infusions: . sodium chloride 250 mL (07/29/16 2130)  . heparin 1,400 Units/hr (08/03/16 0824)  . nitroGLYCERIN Stopped (07/30/16 1926)   PRN Meds: sodium chloride, acetaminophen, hydrALAZINE, nitroGLYCERIN, ondansetron (ZOFRAN) IV, sodium chloride flush, zolpidem   Vital Signs    Vitals:   08/02/16 1930 08/03/16 0016 08/03/16 0410 08/03/16 0700  BP: (!) 155/62 (!) 153/63 (!) 158/53   Pulse: 63 78 77   Resp: 10 16 (!) 24   Temp: 98.2 F (36.8 C) 98 F (36.7 C) 98.7 F (37.1 C) 98.9 F (37.2 C)  TempSrc: Oral Oral Oral Oral  SpO2: 100% 100% 100% 100%  Weight:   203 lb 12.8 oz (92.4 kg)   Height:        Intake/Output Summary (Last 24 hours) at 08/03/16 0916 Last data filed at 08/03/16 0855  Gross per 24 hour  Intake            631.2 ml  Output              802 ml  Net           -170.8 ml   Filed Weights   08/01/16 0441 08/02/16 0328 08/03/16 0410  Weight: 205 lb (93 kg)  205 lb 4.8 oz (93.1 kg) 203 lb 12.8 oz (92.4 kg)    Telemetry    1st degree AVB, rates in the 60's. Pt had 13 beat run of NSVT yesterday.  - Personally Reviewed  ECG    No new tracings  Physical Exam   GEN: No acute distress.   Neck: No JVD Cardiac: RRR, 2/6 systolic murmurs and LUSB, No rubs, or gallops.  Respiratory: Clear to auscultation bilaterally. GI: Soft, nontender, non-distended  MS: No edema; No deformity. Neuro:  Nonfocal  Psych: Normal affect   Labs    Chemistry  Recent Labs Lab 08/01/16 0548 08/02/16 0619 08/03/16 0421  NA 133* 133* 136  K 3.5 3.7 3.6  CL 100* 101 103  CO2 23 21* 22  GLUCOSE 182* 253* 164*  BUN 52* 52* 49*  CREATININE 3.35* 3.20* 3.22*  CALCIUM 8.9 8.9 9.1  GFRNONAA 17* 18* 18*  GFRAA 20* 21* 21*  ANIONGAP 10 11 11      Hematology  Recent Labs Lab 08/01/16 0548 08/02/16 7322 08/03/16  0421  WBC 6.6 7.1 7.6  RBC 2.97* 2.91* 2.99*  HGB 8.6* 8.5* 8.6*  HCT 26.6* 26.7* 27.1*  MCV 89.6 91.8 90.6  MCH 29.0 29.2 28.8  MCHC 32.3 31.8 31.7  RDW 14.2 14.5 14.5  PLT 239 224 262    Cardiac Enzymes No results for input(s): TROPONINI in the last 168 hours. No results for input(s): TROPIPOC in the last 168 hours.   BNPNo results for input(s): BNP, PROBNP in the last 168 hours.   DDimer No results for input(s): DDIMER in the last 168 hours.   Radiology    No results found.  Cardiac Studies   Left Heart Cath and Coronary Angiography 07/28/16  Conclusion     LM lesion, 75 %stenosed.  Ost Cx lesion, 60 %stenosed.  Ost LAD lesion, 60 %stenosed.  Mid LAD lesion, 90 %stenosed.  Ost 2nd Diag to 2nd Diag lesion, 95 %stenosed.  Mid Cx to Dist Cx lesion, 25 %stenosed.  Prox RCA to Mid RCA lesion, 100 %stenosed.  LV end diastolic pressure is moderately elevated.  There is no aortic valve stenosis.  No ventriculogram done to minimize contrast exposure.   Severe multivessel CAD.  Plan for cardiac surgery consult.   Continue diuresis as renal function allows.     __ Diagnostic Diagram        ________________________________________________________________________  Echo 07/25/16 Study Conclusions  - Left ventricle: The cavity size was normal. There was moderate   concentric hypertrophy. Systolic function was mildly to   moderately reduced. The estimated ejection fraction was in the   range of 40% to 45%. Mid to distal inferior, apical, lateral and   apical septal severe hypokinesis. Doppler parameters are   consistent with abnormal left ventricular relaxation (grade 1   diastolic dysfunction). The E/e&' ratio is >15, suggesting   elevated LV filling pressure. - Aortic valve: Sclerosis without stenosis. There was no   regurgitation. - Mitral valve: Mildly thickened leaflets . There was trivial   regurgitation. - Left atrium: The atrium was normal in size. - Tricuspid valve: There was mild regurgitation. - Pulmonary arteries: PA peak pressure: 40 mm Hg (S). - Inferior vena cava: The vessel was dilated. The respirophasic   diameter changes were blunted (< 50%), consistent with elevated   central venous pressure.  Impressions:  - Compared to a prior study in 2014, the EF is lower at 40-45% with   regional wall motion abnormalities possibly in the LCX territory   distribution, there is diastolic dysfunction with elevated LV   filling pressure, mild pulmonary hypertension and no pericardial   effusion.   Patient Profile     72 y.o. male with a past medical history significant for hypertension, IDDM, BPH, prior herniated disk in 2005 sp L3/L4 diskectomy and fusion, mild bilateral ICA stenosis in 2014 and EF 60-65% with moderate LVH by echo in 2014, there was focal distal anterior and anteroseptal thinning and akinesis, diastolic dysfunction and mild to moderate pulmonary hypertension at the time.  CKD with creatinine of 2.65 in 2015 (now 3.83) -sees Dr. Hinda Lenis in [place]. Presented to  [facility] with chest pain and progressive dyspnea by EMS. Required BIPAP. Troponin T of 0.26, BNP of 19,808, anemia with H/H 9.1/27.5, glucose of 261, no leukocytosis. CXR shows mild cardiomegaly, CHF/consolidation   Assessment & Plan    CAD- non-STEMI -Max troponin 13.49 -Severe multivessel CAD per left heart cath on 07/28/16. Planned CABG on 6/22. Optimizing medications.  -Off nitroglycerin drip. No further chest pain -  On Beta blocker, Bidil, statin, aspirin. IV heparin infusing -Pt has been having NSVT, likely ischemic, 13 beat run yesterday. On max dose carvedilol and HR in 60's at baseline.  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  BNP 19k on admission.  -Was on lasix 80 mg, now on hold due to renal function. Patient is -6 L since admission. Max weight was 213 pounds, now is stable at 205 pounds -Fluid status - euvolemic, no edema or orthopnea  Hypertension -Blood pressures still elevated 140s-160's/60-70's, with increase in Cardura -Currently on Norvasc 10 mg, coreg 25 mg BID, Bidil 20-37.5 mg TID, Cardura was increased to 8 mg bid with no improvement. Will discuss increasing Bidil with Dr. Meda Coffee -Unable to add ACEI/ARB due to CKD stage 4  CKD stage IV -SCr 3.9 on admit, trended down to 3.18 on 6/16, continues to be elevated, now stable at 3.22. -last dose of lasix was 6/16. Will continue to hold.   Diabetes type 2 -Hemoglobin A1c on 07/25/16 was 6.6 -Inpatient CBG monitoring and sliding-scale insulin, will increase sliding scale to moderate sensitivity -Patient with poor glucose control. Consult placed to diabetes coordinator  Anemia -Hgb 8.5, stable  Signed, Daune Perch, NP  08/03/2016, 9:16 AM    The patient was seen, examined and discussed with Daune Perch, NP-C and I agree with the above.   71 year old male post NSTEMI, 3 VD awaiting CABG on 6/22, asymptomatic, BP remains high despite multiple medications, we are unable to add ACEI/ARB sec to CKD stage 4. I  will increase cardura to 8 mg po daily. He has mild headache, I will give tylenol 500 mg po x 1. nsVTs on telemetry have resolved, he feels indigestion today, I will start protonix 40 mg po daily.  BP still elevated, despite increased dose of cardura. We will follow closely post op as BP might be lower then. We will switch glucose sliding scale to moderate.   Ena Dawley, MD 08/03/2016

## 2016-08-04 LAB — CBC
HEMATOCRIT: 26.8 % — AB (ref 39.0–52.0)
Hemoglobin: 8.4 g/dL — ABNORMAL LOW (ref 13.0–17.0)
MCH: 28.5 pg (ref 26.0–34.0)
MCHC: 31.3 g/dL (ref 30.0–36.0)
MCV: 90.8 fL (ref 78.0–100.0)
PLATELETS: 261 10*3/uL (ref 150–400)
RBC: 2.95 MIL/uL — ABNORMAL LOW (ref 4.22–5.81)
RDW: 14.7 % (ref 11.5–15.5)
WBC: 6.5 10*3/uL (ref 4.0–10.5)

## 2016-08-04 LAB — COMPREHENSIVE METABOLIC PANEL
ALT: 37 U/L (ref 17–63)
AST: 31 U/L (ref 15–41)
Albumin: 3.4 g/dL — ABNORMAL LOW (ref 3.5–5.0)
Alkaline Phosphatase: 44 U/L (ref 38–126)
Anion gap: 10 (ref 5–15)
BUN: 45 mg/dL — ABNORMAL HIGH (ref 6–20)
CO2: 22 mmol/L (ref 22–32)
Calcium: 9.1 mg/dL (ref 8.9–10.3)
Chloride: 104 mmol/L (ref 101–111)
Creatinine, Ser: 3 mg/dL — ABNORMAL HIGH (ref 0.61–1.24)
GFR calc Af Amer: 23 mL/min — ABNORMAL LOW (ref >60)
GFR calc non Af Amer: 20 mL/min — ABNORMAL LOW (ref >60)
Glucose, Bld: 164 mg/dL — ABNORMAL HIGH (ref 65–99)
Potassium: 3.6 mmol/L (ref 3.5–5.1)
Sodium: 136 mmol/L (ref 135–145)
Total Bilirubin: 0.6 mg/dL (ref 0.3–1.2)
Total Protein: 7.3 g/dL (ref 6.5–8.1)

## 2016-08-04 LAB — HEPARIN LEVEL (UNFRACTIONATED): Heparin Unfractionated: 0.51 IU/mL (ref 0.30–0.70)

## 2016-08-04 LAB — GLUCOSE, CAPILLARY
GLUCOSE-CAPILLARY: 149 mg/dL — AB (ref 65–99)
Glucose-Capillary: 159 mg/dL — ABNORMAL HIGH (ref 65–99)
Glucose-Capillary: 188 mg/dL — ABNORMAL HIGH (ref 65–99)
Glucose-Capillary: 168 mg/dL — ABNORMAL HIGH (ref 65–99)

## 2016-08-04 MED ORDER — CHLORHEXIDINE GLUCONATE CLOTH 2 % EX PADS
6.0000 | MEDICATED_PAD | Freq: Once | CUTANEOUS | Status: AC
Start: 2016-08-04 — End: 2016-08-04
  Administered 2016-08-04: 6 via TOPICAL

## 2016-08-04 MED ORDER — TRANEXAMIC ACID (OHS) PUMP PRIME SOLUTION
2.0000 mg/kg | INTRAVENOUS | Status: DC
  Filled 2016-08-04 (×2): qty 1.81

## 2016-08-04 MED ORDER — VANCOMYCIN HCL 10 G IV SOLR
1500.0000 mg | INTRAVENOUS | Status: AC
  Administered 2016-08-05: 1500 mg via INTRAVENOUS
  Filled 2016-08-04 (×2): qty 1500

## 2016-08-04 MED ORDER — PLASMA-LYTE 148 IV SOLN
INTRAVENOUS | Status: AC
  Administered 2016-08-05: 09:00:00
  Filled 2016-08-04 (×2): qty 2.5

## 2016-08-04 MED ORDER — DEXTROSE 5 % IV SOLN
1.5000 g | INTRAVENOUS | Status: AC
  Administered 2016-08-05: 1.5 g via INTRAVENOUS
  Administered 2016-08-05: .75 g via INTRAVENOUS
  Filled 2016-08-04 (×2): qty 1.5

## 2016-08-04 MED ORDER — CHLORHEXIDINE GLUCONATE CLOTH 2 % EX PADS
6.0000 | MEDICATED_PAD | Freq: Once | CUTANEOUS | Status: AC
  Administered 2016-08-04: 6 via TOPICAL

## 2016-08-04 MED ORDER — CHLORHEXIDINE GLUCONATE 0.12 % MT SOLN
15.0000 mL | Freq: Once | OROMUCOSAL | Status: DC
  Filled 2016-08-04: qty 15

## 2016-08-04 MED ORDER — BISACODYL 5 MG PO TBEC
5.0000 mg | DELAYED_RELEASE_TABLET | Freq: Once | ORAL | Status: AC
  Administered 2016-08-04: 5 mg via ORAL
  Filled 2016-08-04: qty 1

## 2016-08-04 MED ORDER — TEMAZEPAM 15 MG PO CAPS: 15.0000 mg | ORAL_CAPSULE | Freq: Once | ORAL | Status: DC | PRN

## 2016-08-04 MED ORDER — NITROGLYCERIN IN D5W 200-5 MCG/ML-% IV SOLN
2.0000 ug/min | INTRAVENOUS | Status: DC
  Filled 2016-08-04: qty 250

## 2016-08-04 MED ORDER — SODIUM CHLORIDE 0.9 % IV SOLN
INTRAVENOUS | Status: AC
  Administered 2016-08-05: 2.5 [IU]/h via INTRAVENOUS
  Filled 2016-08-04 (×2): qty 1

## 2016-08-04 MED ORDER — MAGNESIUM SULFATE 50 % IJ SOLN
40.0000 meq | INTRAMUSCULAR | Status: DC
  Filled 2016-08-04 (×2): qty 10

## 2016-08-04 MED ORDER — TRANEXAMIC ACID 1000 MG/10ML IV SOLN
1.5000 mg/kg/h | INTRAVENOUS | Status: AC
  Administered 2016-08-05: 1.5 mg/kg/h via INTRAVENOUS
  Filled 2016-08-04 (×2): qty 25

## 2016-08-04 MED ORDER — TRANEXAMIC ACID (OHS) BOLUS VIA INFUSION
15.0000 mg/kg | INTRAVENOUS | Status: AC
  Administered 2016-08-05: 1357.5 mg via INTRAVENOUS
  Filled 2016-08-04: qty 1358

## 2016-08-04 MED ORDER — SODIUM CHLORIDE 0.9 % IV SOLN
30.0000 ug/min | INTRAVENOUS | Status: AC
  Administered 2016-08-05: 25 ug/min via INTRAVENOUS
  Filled 2016-08-04 (×2): qty 2

## 2016-08-04 MED ORDER — SODIUM CHLORIDE 0.9 % IV SOLN
INTRAVENOUS | Status: DC
  Filled 2016-08-04 (×2): qty 30

## 2016-08-04 MED ORDER — DOPAMINE-DEXTROSE 3.2-5 MG/ML-% IV SOLN
0.0000 ug/kg/min | INTRAVENOUS | Status: DC
  Filled 2016-08-04: qty 250

## 2016-08-04 MED ORDER — DEXTROSE 5 % IV SOLN
750.0000 mg | INTRAVENOUS | Status: DC
  Filled 2016-08-04 (×2): qty 750

## 2016-08-04 MED ORDER — METOPROLOL TARTRATE 12.5 MG HALF TABLET
12.5000 mg | ORAL_TABLET | Freq: Once | ORAL | Status: AC
  Administered 2016-08-05: 12.5 mg via ORAL
  Filled 2016-08-04: qty 1

## 2016-08-04 MED ORDER — EPINEPHRINE PF 1 MG/ML IJ SOLN
0.0000 ug/min | INTRAVENOUS | Status: DC
  Filled 2016-08-04 (×2): qty 4

## 2016-08-04 MED ORDER — POTASSIUM CHLORIDE 2 MEQ/ML IV SOLN
80.0000 meq | INTRAVENOUS | Status: DC
  Filled 2016-08-04 (×2): qty 40

## 2016-08-04 MED ORDER — DEXMEDETOMIDINE HCL IN NACL 400 MCG/100ML IV SOLN
0.1000 ug/kg/h | INTRAVENOUS | Status: AC
  Administered 2016-08-05: .2 ug/kg/h via INTRAVENOUS
  Filled 2016-08-04 (×2): qty 100

## 2016-08-04 NOTE — Progress Notes (Signed)
Progress Note  Patient Name: [name] Date of Encounter: 08/04/2016  Primary Cardiologist: New- Dr Debara Pickett  Subjective   The patient denies chest pain or SOB. He denies palpitations or dizziness. He has been up in the halls with out chest pain or shortness of breath. He slept better last night with ambien. He was having brief feelings of indigestion, PPI started and no discomfort today.   Inpatient Medications    Scheduled Meds: . allopurinol  100 mg Oral Daily  . amLODipine  10 mg Oral Daily  . aspirin  81 mg Oral Daily  . carvedilol  25 mg Oral BID WC  . cholecalciferol  2,000 Units Oral Daily  . doxazosin  8 mg Oral BID  . insulin aspart  0-5 Units Subcutaneous QHS  . insulin aspart  0-9 Units Subcutaneous TID WC  . insulin aspart protamine- aspart  6 Units Subcutaneous Q supper  . insulin aspart protamine- aspart  7 Units Subcutaneous Q breakfast  . isosorbide-hydrALAZINE  2 tablet Oral TID  . levothyroxine  100 mcg Oral QAC breakfast  . lubiprostone  24 mcg Oral BID  . pantoprazole  40 mg Oral Q1200  . rosuvastatin  10 mg Oral QPM  . sodium bicarbonate  650 mg Oral BID  . sodium chloride flush  3 mL Intravenous Q12H  . tamsulosin  0.8 mg Oral QPM   Continuous Infusions: . sodium chloride 250 mL (07/29/16 2130)  . heparin 1,300 Units/hr (08/04/16 0647)  . nitroGLYCERIN Stopped (07/30/16 1926)   PRN Meds: sodium chloride, acetaminophen, hydrALAZINE, nitroGLYCERIN, ondansetron (ZOFRAN) IV, sodium chloride flush, zolpidem   Vital Signs    Vitals:   08/03/16 2110 08/04/16 0043 08/04/16 0419 08/04/16 0744  BP: (!) 146/65 (!) 157/77 (!) 156/65   Pulse: 71 72 75 73  Resp: 13 15 14 16   Temp: 98.9 F (37.2 C) 98 F (36.7 C) 98.7 F (37.1 C) 98.3 F (36.8 C)  TempSrc: Oral Oral Oral Oral  SpO2: 99% 97% 99% 98%  Weight:   199 lb 9.6 oz (90.5 kg)   Height:        Intake/Output Summary (Last 24 hours) at 08/04/16 1024 Last data filed at 08/04/16 0900  Gross  per 24 hour  Intake          1357.77 ml  Output              900 ml  Net           457.77 ml   Filed Weights   08/02/16 0328 08/03/16 0410 08/04/16 0419  Weight: 205 lb 4.8 oz (93.1 kg) 203 lb 12.8 oz (92.4 kg) 199 lb 9.6 oz (90.5 kg)    Telemetry    1st degree AVB, rates in the 60's. No NSVT in last 24 hrs.  - Personally Reviewed  ECG    No new tracings  Physical Exam   GEN: No acute distress.   Neck: No JVD Cardiac: RRR, 2/6 systolic murmurs and LUSB, No rubs, or gallops.  Respiratory: Clear to auscultation bilaterally. GI: Soft, nontender, non-distended  MS: No edema; No deformity. Neuro:  Nonfocal  Psych: Normal affect   Labs    Chemistry  Recent Labs Lab 08/02/16 0619 08/03/16 0421 08/04/16 0520  NA 133* 136 136  K 3.7 3.6 3.6  CL 101 103 104  CO2 21* 22 22  GLUCOSE 253* 164* 164*  BUN 52* 49* 45*  CREATININE 3.20* 3.22* 3.00*  CALCIUM 8.9 9.1 9.1  PROT  --   --  7.3  ALBUMIN  --   --  3.4*  AST  --   --  31  ALT  --   --  37  ALKPHOS  --   --  44  BILITOT  --   --  0.6  GFRNONAA 18* 18* 20*  GFRAA 21* 21* 23*  ANIONGAP 11 11 10      Hematology  Recent Labs Lab 08/02/16 0619 08/03/16 0421 08/04/16 0520  WBC 7.1 7.6 6.5  RBC 2.91* 2.99* 2.95*  HGB 8.5* 8.6* 8.4*  HCT 26.7* 27.1* 26.8*  MCV 91.8 90.6 90.8  MCH 29.2 28.8 28.5  MCHC 31.8 31.7 31.3  RDW 14.5 14.5 14.7  PLT 224 262 261    Cardiac Enzymes No results for input(s): TROPONINI in the last 168 hours. No results for input(s): TROPIPOC in the last 168 hours.   BNPNo results for input(s): BNP, PROBNP in the last 168 hours.   DDimer No results for input(s): DDIMER in the last 168 hours.   Radiology    No results found.  Cardiac Studies   Left Heart Cath and Coronary Angiography 07/28/16  Conclusion     LM lesion, 75 %stenosed.  Ost Cx lesion, 60 %stenosed.  Ost LAD lesion, 60 %stenosed.  Mid LAD lesion, 90 %stenosed.  Ost 2nd Diag to 2nd Diag lesion, 95  %stenosed.  Mid Cx to Dist Cx lesion, 25 %stenosed.  Prox RCA to Mid RCA lesion, 100 %stenosed.  LV end diastolic pressure is moderately elevated.  There is no aortic valve stenosis.  No ventriculogram done to minimize contrast exposure.   Severe multivessel CAD.  Plan for cardiac surgery consult.  Continue diuresis as renal function allows.     __ Diagnostic Diagram        ________________________________________________________________________  Echo 07/25/16 Study Conclusions  - Left ventricle: The cavity size was normal. There was moderate   concentric hypertrophy. Systolic function was mildly to   moderately reduced. The estimated ejection fraction was in the   range of 40% to 45%. Mid to distal inferior, apical, lateral and   apical septal severe hypokinesis. Doppler parameters are   consistent with abnormal left ventricular relaxation (grade 1   diastolic dysfunction). The E/e&' ratio is >15, suggesting   elevated LV filling pressure. - Aortic valve: Sclerosis without stenosis. There was no   regurgitation. - Mitral valve: Mildly thickened leaflets . There was trivial   regurgitation. - Left atrium: The atrium was normal in size. - Tricuspid valve: There was mild regurgitation. - Pulmonary arteries: PA peak pressure: 40 mm Hg (S). - Inferior vena cava: The vessel was dilated. The respirophasic   diameter changes were blunted (< 50%), consistent with elevated   central venous pressure.  Impressions:  - Compared to a prior study in 2014, the EF is lower at 40-45% with   regional wall motion abnormalities possibly in the LCX territory   distribution, there is diastolic dysfunction with elevated LV   filling pressure, mild pulmonary hypertension and no pericardial   effusion.   Patient Profile     71 y.o. male with a past medical history significant for hypertension, IDDM, BPH, prior herniated disk in 2005 sp L3/L4 diskectomy and fusion, mild bilateral ICA  stenosis in 2014 and EF 60-65% with moderate LVH by echo in 2014, there was focal distal anterior and anteroseptal thinning and akinesis, diastolic dysfunction and mild to moderate pulmonary hypertension at the time.  CKD with  creatinine of 2.65 in 2015 (now 3.83) -sees Dr. Hinda Lenis in [place]. Presented to [facility] with chest pain and progressive dyspnea by EMS. Required BIPAP. Troponin T of 0.26, BNP of 19,808, anemia with H/H 9.1/27.5, glucose of 261, no leukocytosis. CXR shows mild cardiomegaly, CHF/consolidation   Assessment & Plan    CAD- non-STEMI -Max troponin 13.49 -Severe multivessel CAD per left heart cath on 07/28/16. Planned CABG on 6/22. Optimizing medications.  -Off nitroglycerin drip. No further chest pain -On Beta blocker, Bidil, statin, aspirin. IV heparin infusing -Pt has been having NSVT, likely ischemic, 13 beat run yesterday. On max dose carvedilol and HR in 60's at baseline.  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  BNP 19k on admission.  -Was on lasix 80 mg, now on hold due to renal function. Patient is -6 L since admission. Max weight was 213 pounds, now is stable at 205 pounds -Fluid status - euvolemic, no edema or orthopnea  Hypertension -Blood pressures still elevated 140s-160's/60-70's, with increase in Cardura -Currently on Norvasc 10 mg, coreg 25 mg BID, Bidil 20-37.5 mg TID, Cardura was increased to 8 mg bid with no improvement. Will discuss increasing Bidil with Dr. Meda Coffee -Unable to add ACEI/ARB due to CKD stage 4  CKD stage IV -SCr 3.9 on admit, trended down to 3.18 on 6/16, continues to be elevated, now stable at 3.00 (3.22 yesterday). -last dose of lasix was 6/16. Will continue to hold. Patient is euvolemic  Diabetes type 2 -Hemoglobin A1c on 07/25/16 was 6.6 -Inpatient CBG monitoring and sliding-scale insulin -Adjustments and insulin regimen as recommended by diabetes coordinator with improvement in a.m. blood sugar. We'll continue to  monitor  GERD -Patient complains of indigestion and Protonix started yesterday with no indigestion today.   Anemia -Hgb 8.4, stable  Signed, Daune Perch, NP  08/04/2016, 10:24 AM    The patient was seen, examined and discussed with Daune Perch, NP-C and I agree with the above.   71 year old male post NSTEMI, 3 VD awaiting CABG on 6/22, asymptomatic, BP remains high despite multiple medications, we are unable to add ACEI/ARB sec to CKD stage 4. I will increase cardura to 8 mg po daily. nsVTs on telemetry have resolved. Indigestion has resolved with initiation of protonix. BP still elevated, despite increased dose of cardura. We will follow closely post op as BP might be lower then. We switched glucose sliding scale to moderate yesterday for better DM control. Hb stable at 8.4. Stable for CABG tomorrow.  Ena Dawley, MD 08/04/2016

## 2016-08-04 NOTE — Plan of Care (Signed)
Problem: Safety: Goal: Ability to remain free from injury will improve Outcome: Progressing Fall risk bundle remains in place. No injuries, skin break down or falls this shift. Will continue to monitor and assess pt.

## 2016-08-04 NOTE — Progress Notes (Signed)
CARDIAC REHAB PHASE I   PRE:  Rate/Rhythm: 63 SR  BP:  Sitting: 147/60        SaO2: 100 RA  MODE:  Ambulation: 210 ft   POST:  Rate/Rhythm: 72 SR  BP:  Sitting: 169/61         SaO2: 94 RA  Pt ambulating 210 ft on RA, IV, rolling walker, assist x1, slow, steady gait, tolerated well with no complaints other than some fatigue upon returning to room, appreciative of walk. Pt to bed per pt request after walk, call bell within reach. Will follow post-op.   [place], RN, BSN 08/04/2016 2:35 PM

## 2016-08-04 NOTE — Progress Notes (Signed)
ANTICOAGULATION CONSULT NOTE - Follow Up Consult  Pharmacy Consult for Heparin Indication: ACS/STEMI  No Known Allergies  Patient Measurements: Height: 6\' 1"  (185.4 cm) Weight: 199 lb 9.6 oz (90.5 kg) IBW/kg (Calculated) : 79.9 Heparin Dosing Weight: 92.8 kg  Vital Signs: Temp: 98.3 F (36.8 C) (06/21 0744) Temp Source: Oral (06/21 0744) BP: 156/65 (06/21 0419) Pulse Rate: 73 (06/21 0744)  Labs:  Recent Labs  08/02/16 0619 08/03/16 0421 08/04/16 0520  HGB 8.5* 8.6* 8.4*  HCT 26.7* 27.1* 26.8*  PLT 224 262 261  HEPARINUNFRC 0.60 0.69 0.51  CREATININE 3.20* 3.22* 3.00*    Estimated Creatinine Clearance: 25.5 mL/min (A) (by C-G formula based on SCr of 3 mg/dL (H)).   Assessment: 71 yo male on heparin infusion for severe CAD pending CABG on 08/05/16. Heparin level remains therapeutic at 0.51. CBC stable.   Goal of Therapy:  Heparin level 0.3-0.7 units/ml Monitor platelets by anticoagulation protocol: Yes   Plan:  1. Continue heparin infusion at 1300 units/hr 2. Daily heparin level and CBC 3. CABG planned 08/05/16   Vincenza Hews, PharmD, BCPS 08/04/2016, 8:15 AM

## 2016-08-04 NOTE — Progress Notes (Signed)
Patient ID: FRUTOSO DIMARE, male   DOB: 1945-10-28, 71 y.o.   MRN: [id number] Patient ID: LONZO SAULTER, male   DOB: 1945-07-26, 71 y.o.   MRN: [id number]      [address]       [place] [address]             [phone]                 7 Days Post-Op Procedure(s) (LRB): Left Heart Cath and Coronary Angiography (N/A)  LOS: 9 days   Subjective: No complaints today   Objective: Vital signs in last 24 hours: Patient Vitals for the past 24 hrs:  BP Temp Temp src Pulse Resp SpO2 Weight  08/04/16 1138 (!) 151/61 98.6 F (37 C) Oral 74 15 99 % -  08/04/16 0744 - 98.3 F (36.8 C) Oral 73 16 98 % -  08/04/16 0419 (!) 156/65 98.7 F (37.1 C) Oral 75 14 99 % 199 lb 9.6 oz (90.5 kg)  08/04/16 0043 (!) 157/77 98 F (36.7 C) Oral 72 15 97 % -  08/03/16 2110 (!) 146/65 98.9 F (37.2 C) Oral 71 13 99 % -  08/03/16 1657 - 98.8 F (37.1 C) Oral - - - -    Filed Weights   08/02/16 0328 08/03/16 0410 08/04/16 0419  Weight: 205 lb 4.8 oz (93.1 kg) 203 lb 12.8 oz (92.4 kg) 199 lb 9.6 oz (90.5 kg)    Hemodynamic parameters for last 24 hours:    Intake/Output from previous day: 06/20 0701 - 06/21 0700 In: 1357.8 [P.O.:960; I.V.:397.8] Out: 902 [Urine:901; Stool:1] Intake/Output this shift: Total I/O In: 240 [P.O.:240] Out: -   Scheduled Meds: . allopurinol  100 mg Oral Daily  . amLODipine  10 mg Oral Daily  . aspirin  81 mg Oral Daily  . carvedilol  25 mg Oral BID WC  . cholecalciferol  2,000 Units Oral Daily  . doxazosin  8 mg Oral BID  . insulin aspart  0-5 Units Subcutaneous QHS  . insulin aspart  0-9 Units Subcutaneous TID WC  . insulin aspart protamine- aspart  6 Units Subcutaneous Q supper  . insulin aspart protamine- aspart  7 Units Subcutaneous Q breakfast  . isosorbide-hydrALAZINE  2 tablet Oral TID  . levothyroxine  100 mcg Oral QAC breakfast  . lubiprostone  24 mcg Oral BID  . pantoprazole  40 mg Oral Q1200  . rosuvastatin  10 mg Oral QPM  .  sodium bicarbonate  650 mg Oral BID  . sodium chloride flush  3 mL Intravenous Q12H  . tamsulosin  0.8 mg Oral QPM   Continuous Infusions: . sodium chloride 250 mL (07/29/16 2130)  . heparin 1,300 Units/hr (08/04/16 0647)  . nitroGLYCERIN Stopped (07/30/16 1926)   PRN Meds:.sodium chloride, acetaminophen, hydrALAZINE, nitroGLYCERIN, ondansetron (ZOFRAN) IV, sodium chloride flush, zolpidem  General appearance: alert and cooperative Neurologic: intact Heart: regular rate and rhythm, S1, S2 normal, no murmur, click, rub or gallop Lungs: clear to auscultation bilaterally Abdomen: soft, non-tender; bowel sounds normal; no masses,  no organomegaly Extremities: extremities normal, atraumatic, no cyanosis or edema and Homans sign is negative, no sign of DVT   Lab Results: CBC:  Recent Labs  08/03/16 0421 08/04/16 0520  WBC 7.6 6.5  HGB 8.6* 8.4*  HCT 27.1* 26.8*  PLT 262 261   BMET:   Recent Labs  08/03/16 0421 08/04/16 0520  NA 136 136  K 3.6 3.6  CL 103  104  CO2 22 22  GLUCOSE 164* 164*  BUN 49* 45*  CREATININE 3.22* 3.00*  CALCIUM 9.1 9.1    PT/INR: No results for input(s): LABPROT, INR in the last 72 hours.   Radiology No results found.  Chronic Kidney Disease   Stage I     GFR >90  Stage II    GFR 60-89  Stage IIIA GFR 45-59  Stage IIIB GFR 30-44  Stage IV   GFR 15-29  Stage V    GFR  <15  Lab Results  Component Value Date   CREATININE 3.00 (H) 08/04/2016   Estimated Creatinine Clearance: 25.5 mL/min (A) (by C-G formula based on SCr of 3 mg/dL (H)).  Assessment/Plan: S/P Procedure(s) (LRB): Left Heart Cath and Coronary Angiography (N/A) Remains with elevated cr, Stage IV 3.0 today  Discussed with patient proceeding with CABG Friday  Plan cabgin am, discussed with patient who will let his family know.  The goals risks and alternatives of the planned surgical procedure Procedure(s): Left Heart Cath and Coronary Angiography (N/A)  have been  discussed with the patient in detail. The risks of the procedure including death, infection, stroke, myocardial infarction, bleeding, blood transfusion and worsening of renal function  have all been discussed specifically.  I have quoted Benay Pillow a 4 % of perioperative mortality and a complication rate as high as 50 %. The patient's questions have been answered.Benay Pillow is willing  to proceed with the planned procedure.   [name] MD 08/04/2016 2:05 PM

## 2016-08-04 NOTE — Progress Notes (Signed)
PT Cancellation Note  Patient Details Name: [name] MRN: [id number] DOB: 1945-10-19   Cancelled Treatment:    Reason Eval/Treat Not Completed: Patient declined, no reason specified.  "already been this afternoon", not wanting to do more now. 08/04/2016  Donnella Sham, PT [phone]  (pager)   [name] 08/04/2016, 5:33 PM

## 2016-08-05 ENCOUNTER — Inpatient Hospital Stay (HOSPITAL_COMMUNITY): Payer: Medicare Other | Admitting: Certified Registered"

## 2016-08-05 ENCOUNTER — Inpatient Hospital Stay (HOSPITAL_COMMUNITY): Payer: Medicare Other

## 2016-08-05 ENCOUNTER — Encounter (HOSPITAL_COMMUNITY)
Admission: AD | Disposition: A | Discharge status: Skilled Nursing Facility | Payer: Self-pay | Source: Other Acute Inpatient Hospital | Attending: Internal Medicine

## 2016-08-05 DIAGNOSIS — I2510 Atherosclerotic heart disease of native coronary artery without angina pectoris: Secondary | ICD-10-CM

## 2016-08-05 HISTORY — PX: TEE WITHOUT CARDIOVERSION: SHX5443

## 2016-08-05 HISTORY — PX: CORONARY ARTERY BYPASS GRAFT: SHX141

## 2016-08-05 LAB — POCT I-STAT 3, ART BLOOD GAS (G3+)
ACID-BASE DEFICIT: 2 mmol/L (ref 0.0–2.0)
Acid-base deficit: 1 mmol/L (ref 0.0–2.0)
Acid-base deficit: 5 mmol/L — ABNORMAL HIGH (ref 0.0–2.0)
Acid-base deficit: 8 mmol/L — ABNORMAL HIGH (ref 0.0–2.0)
Acid-base deficit: 6 mmol/L — ABNORMAL HIGH (ref 0.0–2.0)
Acid-base deficit: 3 mmol/L — ABNORMAL HIGH (ref 0.0–2.0)
BICARBONATE: 24.5 mmol/L (ref 20.0–28.0)
Bicarbonate: 19.7 mmol/L — ABNORMAL LOW (ref 20.0–28.0)
Bicarbonate: 17.3 mmol/L — ABNORMAL LOW (ref 20.0–28.0)
Bicarbonate: 18.8 mmol/L — ABNORMAL LOW (ref 20.0–28.0)
Bicarbonate: 21 mmol/L (ref 20.0–28.0)
Bicarbonate: 20.2 mmol/L (ref 20.0–28.0)
O2 SAT: 97 %
O2 Saturation: 100 %
O2 Saturation: 94 %
O2 Saturation: 94 %
O2 Saturation: 97 %
O2 Saturation: 93 %
PCO2 ART: 46.1 mmHg (ref 32.0–48.0)
PCO2 ART: 34.4 mmHg (ref 32.0–48.0)
PCO2 ART: 32 mmHg (ref 32.0–48.0)
PCO2 ART: 32.8 mmHg (ref 32.0–48.0)
PCO2 ART: 31.2 mmHg — AB (ref 32.0–48.0)
PH ART: 7.334 — AB (ref 7.350–7.450)
PH ART: 7.362 (ref 7.350–7.450)
PH ART: 7.341 — AB (ref 7.350–7.450)
PH ART: 7.368 (ref 7.350–7.450)
PH ART: 7.437 (ref 7.350–7.450)
PH ART: 7.423 (ref 7.350–7.450)
PO2 ART: 366 mmHg — AB (ref 83.0–108.0)
PO2 ART: 70 mmHg — AB (ref 83.0–108.0)
Patient temperature: 35.9
TCO2: 26 mmol/L (ref 0–100)
TCO2: 21 mmol/L (ref 0–100)
TCO2: 18 mmol/L (ref 0–100)
TCO2: 20 mmol/L (ref 0–100)
TCO2: 22 mmol/L (ref 0–100)
TCO2: 21 mmol/L (ref 0–100)
pCO2 arterial: 31.3 mmHg — ABNORMAL LOW (ref 32.0–48.0)
pO2, Arterial: 74 mmHg — ABNORMAL LOW (ref 83.0–108.0)
pO2, Arterial: 92 mmHg (ref 83.0–108.0)
pO2, Arterial: 95 mmHg (ref 83.0–108.0)
pO2, Arterial: 69 mmHg — ABNORMAL LOW (ref 83.0–108.0)

## 2016-08-05 LAB — POCT I-STAT, CHEM 8
BUN: 39 mg/dL — ABNORMAL HIGH (ref 6–20)
BUN: 38 mg/dL — ABNORMAL HIGH (ref 6–20)
BUN: 37 mg/dL — AB (ref 6–20)
BUN: 37 mg/dL — AB (ref 6–20)
BUN: 38 mg/dL — AB (ref 6–20)
BUN: 37 mg/dL — AB (ref 6–20)
BUN: 36 mg/dL — ABNORMAL HIGH (ref 6–20)
CALCIUM ION: 1.16 mmol/L (ref 1.15–1.40)
CHLORIDE: 106 mmol/L (ref 101–111)
CHLORIDE: 104 mmol/L (ref 101–111)
CHLORIDE: 105 mmol/L (ref 101–111)
CHLORIDE: 105 mmol/L (ref 101–111)
CREATININE: 2.6 mg/dL — AB (ref 0.61–1.24)
CREATININE: 2.7 mg/dL — AB (ref 0.61–1.24)
CREATININE: 2.7 mg/dL — AB (ref 0.61–1.24)
Calcium, Ion: 1.31 mmol/L (ref 1.15–1.40)
Calcium, Ion: 1.3 mmol/L (ref 1.15–1.40)
Calcium, Ion: 1.12 mmol/L — ABNORMAL LOW (ref 1.15–1.40)
Calcium, Ion: 1.17 mmol/L (ref 1.15–1.40)
Calcium, Ion: 1.18 mmol/L (ref 1.15–1.40)
Calcium, Ion: 1.27 mmol/L (ref 1.15–1.40)
Chloride: 105 mmol/L (ref 101–111)
Chloride: 105 mmol/L (ref 101–111)
Chloride: 110 mmol/L (ref 101–111)
Creatinine, Ser: 2.8 mg/dL — ABNORMAL HIGH (ref 0.61–1.24)
Creatinine, Ser: 2.7 mg/dL — ABNORMAL HIGH (ref 0.61–1.24)
Creatinine, Ser: 2.6 mg/dL — ABNORMAL HIGH (ref 0.61–1.24)
Creatinine, Ser: 2.6 mg/dL — ABNORMAL HIGH (ref 0.61–1.24)
GLUCOSE: 134 mg/dL — AB (ref 65–99)
GLUCOSE: 147 mg/dL — AB (ref 65–99)
Glucose, Bld: 186 mg/dL — ABNORMAL HIGH (ref 65–99)
Glucose, Bld: 155 mg/dL — ABNORMAL HIGH (ref 65–99)
Glucose, Bld: 142 mg/dL — ABNORMAL HIGH (ref 65–99)
Glucose, Bld: 147 mg/dL — ABNORMAL HIGH (ref 65–99)
Glucose, Bld: 136 mg/dL — ABNORMAL HIGH (ref 65–99)
HCT: 25 % — ABNORMAL LOW (ref 39.0–52.0)
HCT: 26 % — ABNORMAL LOW (ref 39.0–52.0)
HEMATOCRIT: 25 % — AB (ref 39.0–52.0)
HEMATOCRIT: 21 % — AB (ref 39.0–52.0)
HEMATOCRIT: 22 % — AB (ref 39.0–52.0)
HEMATOCRIT: 26 % — AB (ref 39.0–52.0)
HEMATOCRIT: 28 % — AB (ref 39.0–52.0)
HEMOGLOBIN: 8.5 g/dL — AB (ref 13.0–17.0)
HEMOGLOBIN: 7.1 g/dL — AB (ref 13.0–17.0)
HEMOGLOBIN: 7.5 g/dL — AB (ref 13.0–17.0)
HEMOGLOBIN: 9.5 g/dL — AB (ref 13.0–17.0)
Hemoglobin: 8.5 g/dL — ABNORMAL LOW (ref 13.0–17.0)
Hemoglobin: 8.8 g/dL — ABNORMAL LOW (ref 13.0–17.0)
Hemoglobin: 8.8 g/dL — ABNORMAL LOW (ref 13.0–17.0)
POTASSIUM: 3.6 mmol/L (ref 3.5–5.1)
POTASSIUM: 4 mmol/L (ref 3.5–5.1)
POTASSIUM: 4.5 mmol/L (ref 3.5–5.1)
POTASSIUM: 3.7 mmol/L (ref 3.5–5.1)
POTASSIUM: 4.2 mmol/L (ref 3.5–5.1)
Potassium: 3.7 mmol/L (ref 3.5–5.1)
Potassium: 3.9 mmol/L (ref 3.5–5.1)
SODIUM: 139 mmol/L (ref 135–145)
SODIUM: 138 mmol/L (ref 135–145)
Sodium: 139 mmol/L (ref 135–145)
Sodium: 137 mmol/L (ref 135–145)
Sodium: 139 mmol/L (ref 135–145)
Sodium: 140 mmol/L (ref 135–145)
Sodium: 137 mmol/L (ref 135–145)
TCO2: 23 mmol/L (ref 0–100)
TCO2: 24 mmol/L (ref 0–100)
TCO2: 24 mmol/L (ref 0–100)
TCO2: 26 mmol/L (ref 0–100)
TCO2: 24 mmol/L (ref 0–100)
TCO2: 21 mmol/L (ref 0–100)
TCO2: 19 mmol/L (ref 0–100)

## 2016-08-05 LAB — URINALYSIS, ROUTINE W REFLEX MICROSCOPIC
Bilirubin Urine: NEGATIVE
Glucose, UA: NEGATIVE mg/dL
Ketones, ur: NEGATIVE mg/dL
Leukocytes, UA: NEGATIVE
Nitrite: NEGATIVE
Protein, ur: 100 mg/dL — AB
Specific Gravity, Urine: 1.015 (ref 1.005–1.030)
pH: 5 (ref 5.0–8.0)

## 2016-08-05 LAB — BASIC METABOLIC PANEL
Anion gap: 9 (ref 5–15)
BUN: 42 mg/dL — AB (ref 6–20)
CO2: 22 mmol/L (ref 22–32)
CREATININE: 2.99 mg/dL — AB (ref 0.61–1.24)
Calcium: 9.2 mg/dL (ref 8.9–10.3)
Chloride: 105 mmol/L (ref 101–111)
GFR calc Af Amer: 23 mL/min — ABNORMAL LOW (ref >60)
GFR, EST NON AFRICAN AMERICAN: 20 mL/min — AB (ref >60)
GLUCOSE: 192 mg/dL — AB (ref 65–99)
Potassium: 3.6 mmol/L (ref 3.5–5.1)
Sodium: 136 mmol/L (ref 135–145)

## 2016-08-05 LAB — CBC
HCT: 26.6 % — ABNORMAL LOW (ref 39.0–52.0)
HCT: 30.5 % — ABNORMAL LOW (ref 39.0–52.0)
HCT: 29.7 % — ABNORMAL LOW (ref 39.0–52.0)
HEMOGLOBIN: 9.9 g/dL — AB (ref 13.0–17.0)
Hemoglobin: 8.3 g/dL — ABNORMAL LOW (ref 13.0–17.0)
Hemoglobin: 9.7 g/dL — ABNORMAL LOW (ref 13.0–17.0)
MCH: 28.4 pg (ref 26.0–34.0)
MCH: 29.3 pg (ref 26.0–34.0)
MCH: 29.2 pg (ref 26.0–34.0)
MCHC: 31.2 g/dL (ref 30.0–36.0)
MCHC: 32.5 g/dL (ref 30.0–36.0)
MCHC: 32.7 g/dL (ref 30.0–36.0)
MCV: 91.1 fL (ref 78.0–100.0)
MCV: 90.2 fL (ref 78.0–100.0)
MCV: 89.5 fL (ref 78.0–100.0)
PLATELETS: 148 10*3/uL — AB (ref 150–400)
Platelets: 229 10*3/uL (ref 150–400)
Platelets: 174 10*3/uL (ref 150–400)
RBC: 2.92 MIL/uL — ABNORMAL LOW (ref 4.22–5.81)
RBC: 3.38 MIL/uL — ABNORMAL LOW (ref 4.22–5.81)
RBC: 3.32 MIL/uL — ABNORMAL LOW (ref 4.22–5.81)
RDW: 14.7 % (ref 11.5–15.5)
RDW: 15 % (ref 11.5–15.5)
RDW: 15.2 % (ref 11.5–15.5)
WBC: 7 10*3/uL (ref 4.0–10.5)
WBC: 11.6 10*3/uL — ABNORMAL HIGH (ref 4.0–10.5)
WBC: 12 10*3/uL — ABNORMAL HIGH (ref 4.0–10.5)

## 2016-08-05 LAB — PROTIME-INR
INR: 1.12
INR: 1.27
PROTHROMBIN TIME: 14.4 s (ref 11.4–15.2)
PROTHROMBIN TIME: 16 s — AB (ref 11.4–15.2)

## 2016-08-05 LAB — POCT I-STAT 4, (NA,K, GLUC, HGB,HCT)
Glucose, Bld: 92 mg/dL (ref 65–99)
HCT: 31 % — ABNORMAL LOW (ref 39.0–52.0)
HEMOGLOBIN: 10.5 g/dL — AB (ref 13.0–17.0)
POTASSIUM: 3.7 mmol/L (ref 3.5–5.1)
Sodium: 142 mmol/L (ref 135–145)

## 2016-08-05 LAB — BLOOD GAS, ARTERIAL
ACID-BASE DEFICIT: 3.9 mmol/L — AB (ref 0.0–2.0)
Bicarbonate: 19.9 mmol/L — ABNORMAL LOW (ref 20.0–28.0)
DRAWN BY: 405301
FIO2: 21
O2 Saturation: 94.4 %
PCO2 ART: 32 mmHg (ref 32.0–48.0)
PH ART: 7.412 (ref 7.350–7.450)
Patient temperature: 99
pO2, Arterial: 73.3 mmHg — ABNORMAL LOW (ref 83.0–108.0)

## 2016-08-05 LAB — GLUCOSE, CAPILLARY
GLUCOSE-CAPILLARY: 110 mg/dL — AB (ref 65–99)
GLUCOSE-CAPILLARY: 128 mg/dL — AB (ref 65–99)
GLUCOSE-CAPILLARY: 148 mg/dL — AB (ref 65–99)
GLUCOSE-CAPILLARY: 158 mg/dL — AB (ref 65–99)
GLUCOSE-CAPILLARY: 141 mg/dL — AB (ref 65–99)
Glucose-Capillary: 88 mg/dL (ref 65–99)
Glucose-Capillary: 82 mg/dL (ref 65–99)
Glucose-Capillary: 144 mg/dL — ABNORMAL HIGH (ref 65–99)
Glucose-Capillary: 110 mg/dL — ABNORMAL HIGH (ref 65–99)

## 2016-08-05 LAB — APTT
APTT: 99 s — AB (ref 24–36)
aPTT: 34 seconds (ref 24–36)

## 2016-08-05 LAB — PLATELET COUNT: Platelets: 168 10*3/uL (ref 150–400)

## 2016-08-05 LAB — SURGICAL PCR SCREEN
MRSA, PCR: NEGATIVE
STAPHYLOCOCCUS AUREUS: NEGATIVE

## 2016-08-05 LAB — CREATININE, SERUM
Creatinine, Ser: 2.93 mg/dL — ABNORMAL HIGH (ref 0.61–1.24)
GFR calc Af Amer: 23 mL/min — ABNORMAL LOW (ref >60)
GFR calc non Af Amer: 20 mL/min — ABNORMAL LOW (ref >60)

## 2016-08-05 LAB — MAGNESIUM: Magnesium: 2 mg/dL (ref 1.7–2.4)

## 2016-08-05 LAB — HEPARIN LEVEL (UNFRACTIONATED): HEPARIN UNFRACTIONATED: 0.56 [IU]/mL (ref 0.30–0.70)

## 2016-08-05 LAB — HEMOGLOBIN AND HEMATOCRIT, BLOOD
HCT: 24.8 % — ABNORMAL LOW (ref 39.0–52.0)
Hemoglobin: 8 g/dL — ABNORMAL LOW (ref 13.0–17.0)

## 2016-08-05 LAB — PREPARE RBC (CROSSMATCH)

## 2016-08-05 SURGERY — CORONARY ARTERY BYPASS GRAFTING (CABG)
Anesthesia: General | Site: Chest

## 2016-08-05 MED ORDER — SODIUM CHLORIDE 0.9 % IJ SOLN
OROMUCOSAL | Status: DC | PRN
  Administered 2016-08-05 (×3): 4 mL via TOPICAL

## 2016-08-05 MED ORDER — FAMOTIDINE IN NACL 20-0.9 MG/50ML-% IV SOLN
20.0000 mg | Freq: Two times a day (BID) | INTRAVENOUS | Status: AC
  Administered 2016-08-05: 20 mg via INTRAVENOUS

## 2016-08-05 MED ORDER — SODIUM CHLORIDE 0.9% FLUSH
3.0000 mL | Freq: Two times a day (BID) | INTRAVENOUS | Status: DC
  Administered 2016-08-06 – 2016-08-09 (×6): 3 mL via INTRAVENOUS

## 2016-08-05 MED ORDER — OXYCODONE HCL 5 MG PO TABS
5.0000 mg | ORAL_TABLET | ORAL | Status: DC | PRN
  Administered 2016-08-06 – 2016-08-08 (×7): 10 mg via ORAL
  Filled 2016-08-05 (×7): qty 2

## 2016-08-05 MED ORDER — BISACODYL 5 MG PO TBEC
10.0000 mg | DELAYED_RELEASE_TABLET | Freq: Every day | ORAL | Status: DC
  Administered 2016-08-06 – 2016-08-09 (×3): 10 mg via ORAL
  Filled 2016-08-05 (×3): qty 2

## 2016-08-05 MED ORDER — ORAL CARE MOUTH RINSE: 15.0000 mL | Freq: Four times a day (QID) | OROMUCOSAL | Status: DC

## 2016-08-05 MED ORDER — DOPAMINE-DEXTROSE 3.2-5 MG/ML-% IV SOLN: 3.0000 ug/kg/min | INTRAVENOUS | Status: DC

## 2016-08-05 MED ORDER — PROTAMINE SULFATE 10 MG/ML IV SOLN
INTRAVENOUS | Status: DC | PRN
  Administered 2016-08-05: 300 mg via INTRAVENOUS

## 2016-08-05 MED ORDER — INSULIN REGULAR BOLUS VIA INFUSION
0.0000 [IU] | Freq: Three times a day (TID) | INTRAVENOUS | Status: DC
  Administered 2016-08-06: 4.3 [IU] via INTRAVENOUS
  Filled 2016-08-05: qty 10

## 2016-08-05 MED ORDER — SODIUM CHLORIDE 0.9% FLUSH
3.0000 mL | INTRAVENOUS | Status: DC | PRN
  Administered 2016-08-09: 3 mL via INTRAVENOUS
  Filled 2016-08-05: qty 3

## 2016-08-05 MED ORDER — SODIUM CHLORIDE 0.9 % IV SOLN
INTRAVENOUS | Status: DC
  Administered 2016-08-05: 3.9 [IU]/h via INTRAVENOUS
  Filled 2016-08-05 (×2): qty 1

## 2016-08-05 MED ORDER — METOPROLOL TARTRATE 12.5 MG HALF TABLET: 12.5000 mg | ORAL_TABLET | Freq: Two times a day (BID) | ORAL | Status: DC

## 2016-08-05 MED ORDER — SODIUM CHLORIDE 0.9 % IV SOLN: 250.0000 mL | INTRAVENOUS | Status: DC

## 2016-08-05 MED ORDER — SODIUM BICARBONATE 8.4 % IV SOLN
50.0000 meq | Freq: Once | INTRAVENOUS | Status: AC
  Administered 2016-08-05: 50 meq via INTRAVENOUS

## 2016-08-05 MED ORDER — SODIUM CHLORIDE 0.45 % IV SOLN: INTRAVENOUS | Status: DC | PRN

## 2016-08-05 MED ORDER — TRAMADOL HCL 50 MG PO TABS: 50.0000 mg | ORAL_TABLET | ORAL | Status: DC | PRN

## 2016-08-05 MED ORDER — DOPAMINE-DEXTROSE 1.6-5 MG/ML-% IV SOLN
INTRAVENOUS | Status: DC | PRN
  Administered 2016-08-05: 2.5 ug/kg/min via INTRAVENOUS

## 2016-08-05 MED ORDER — 0.9 % SODIUM CHLORIDE (POUR BTL) OPTIME
TOPICAL | Status: DC | PRN
  Administered 2016-08-05: 6000 mL

## 2016-08-05 MED ORDER — CHLORHEXIDINE GLUCONATE 0.12 % MT SOLN
15.0000 mL | OROMUCOSAL | Status: AC
  Administered 2016-08-05: 15 mL via OROMUCOSAL

## 2016-08-05 MED ORDER — MIDAZOLAM HCL 5 MG/5ML IJ SOLN
INTRAMUSCULAR | Status: DC | PRN
  Administered 2016-08-05: 2 mg via INTRAVENOUS
  Administered 2016-08-05 (×5): 1 mg via INTRAVENOUS

## 2016-08-05 MED ORDER — ORAL CARE MOUTH RINSE
15.0000 mL | Freq: Two times a day (BID) | OROMUCOSAL | Status: DC
  Administered 2016-08-05 – 2016-08-06 (×2): 15 mL via OROMUCOSAL

## 2016-08-05 MED ORDER — FENTANYL CITRATE (PF) 250 MCG/5ML IJ SOLN
INTRAMUSCULAR | Status: DC | PRN
  Administered 2016-08-05: 50 ug via INTRAVENOUS
  Administered 2016-08-05: 100 ug via INTRAVENOUS
  Administered 2016-08-05 (×3): 150 ug via INTRAVENOUS
  Administered 2016-08-05: 100 ug via INTRAVENOUS
  Administered 2016-08-05: 50 ug via INTRAVENOUS
  Administered 2016-08-05: 300 ug via INTRAVENOUS
  Administered 2016-08-05 (×2): 100 ug via INTRAVENOUS

## 2016-08-05 MED ORDER — MORPHINE SULFATE (PF) 2 MG/ML IV SOLN: 1.0000 mg | INTRAVENOUS | Status: AC | PRN

## 2016-08-05 MED ORDER — SODIUM CHLORIDE 0.9 % IV SOLN
0.0000 ug/min | INTRAVENOUS | Status: DC
  Filled 2016-08-05: qty 2

## 2016-08-05 MED ORDER — METOPROLOL TARTRATE 25 MG/10 ML ORAL SUSPENSION
12.5000 mg | Freq: Two times a day (BID) | ORAL | Status: DC
  Filled 2016-08-05: qty 5

## 2016-08-05 MED ORDER — ONDANSETRON HCL 4 MG/2ML IJ SOLN
4.0000 mg | Freq: Four times a day (QID) | INTRAMUSCULAR | Status: DC | PRN
  Administered 2016-08-06 (×2): 4 mg via INTRAVENOUS
  Filled 2016-08-05 (×2): qty 2

## 2016-08-05 MED ORDER — LACTATED RINGERS IV SOLN
500.0000 mL | Freq: Once | INTRAVENOUS | Status: DC | PRN
Start: 2016-08-05 — End: 2016-08-06

## 2016-08-05 MED ORDER — PANTOPRAZOLE SODIUM 40 MG PO TBEC
40.0000 mg | DELAYED_RELEASE_TABLET | Freq: Every day | ORAL | Status: DC
  Administered 2016-08-07 – 2016-08-09 (×3): 40 mg via ORAL
  Filled 2016-08-05 (×3): qty 1

## 2016-08-05 MED ORDER — FENTANYL CITRATE (PF) 250 MCG/5ML IJ SOLN
INTRAMUSCULAR | Status: AC
Start: 2016-08-05 — End: ?
  Filled 2016-08-05: qty 25

## 2016-08-05 MED ORDER — HEPARIN SODIUM (PORCINE) 1000 UNIT/ML IJ SOLN
INTRAMUSCULAR | Status: DC | PRN
  Administered 2016-08-05: 38000 [IU] via INTRAVENOUS
  Administered 2016-08-05: 5000 [IU] via INTRAVENOUS

## 2016-08-05 MED ORDER — NITROGLYCERIN IN D5W 200-5 MCG/ML-% IV SOLN
0.0000 ug/min | INTRAVENOUS | Status: DC
  Filled 2016-08-05 (×2): qty 250

## 2016-08-05 MED ORDER — ASPIRIN EC 325 MG PO TBEC
325.0000 mg | DELAYED_RELEASE_TABLET | Freq: Every day | ORAL | Status: DC
  Administered 2016-08-06 – 2016-08-09 (×4): 325 mg via ORAL
  Filled 2016-08-05 (×4): qty 1

## 2016-08-05 MED ORDER — FUROSEMIDE 10 MG/ML IJ SOLN
INTRAMUSCULAR | Status: DC | PRN
  Administered 2016-08-05: 80 mg via INTRAMUSCULAR

## 2016-08-05 MED ORDER — DEXMEDETOMIDINE HCL IN NACL 400 MCG/100ML IV SOLN
0.0000 ug/kg/h | INTRAVENOUS | Status: DC
  Filled 2016-08-05: qty 100

## 2016-08-05 MED ORDER — DOCUSATE SODIUM 100 MG PO CAPS
200.0000 mg | ORAL_CAPSULE | Freq: Every day | ORAL | Status: DC
  Administered 2016-08-06 – 2016-08-09 (×4): 200 mg via ORAL
  Filled 2016-08-05 (×4): qty 2

## 2016-08-05 MED ORDER — PROPOFOL 10 MG/ML IV BOLUS
INTRAVENOUS | Status: DC | PRN
  Administered 2016-08-05: 10 mg via INTRAVENOUS
  Administered 2016-08-05: 20 mg via INTRAVENOUS
  Administered 2016-08-05: 30 mg via INTRAVENOUS
  Administered 2016-08-05: 20 mg via INTRAVENOUS

## 2016-08-05 MED ORDER — MORPHINE SULFATE (PF) 2 MG/ML IV SOLN: 2.0000 mg | INTRAVENOUS | Status: DC | PRN

## 2016-08-05 MED ORDER — SODIUM CHLORIDE 0.9 % IV SOLN: Freq: Once | INTRAVENOUS | Status: DC

## 2016-08-05 MED ORDER — SODIUM CHLORIDE 0.9 % IV SOLN: INTRAVENOUS | Status: DC

## 2016-08-05 MED ORDER — METOPROLOL TARTRATE 5 MG/5ML IV SOLN
2.5000 mg | INTRAVENOUS | Status: DC | PRN
  Administered 2016-08-05 (×2): 2.5 mg via INTRAVENOUS
  Filled 2016-08-05: qty 5

## 2016-08-05 MED ORDER — DEXTROSE 5 % IV SOLN
1.5000 g | Freq: Two times a day (BID) | INTRAVENOUS | Status: AC
  Administered 2016-08-05 – 2016-08-07 (×4): 1.5 g via INTRAVENOUS
  Filled 2016-08-05 (×4): qty 1.5

## 2016-08-05 MED ORDER — SODIUM CHLORIDE 0.9 % IV SOLN
0.0000 ug/kg/h | INTRAVENOUS | Status: DC
  Filled 2016-08-05 (×2): qty 2

## 2016-08-05 MED ORDER — ACETAMINOPHEN 650 MG RE SUPP
650.0000 mg | Freq: Once | RECTAL | Status: AC
  Administered 2016-08-05: 650 mg via RECTAL

## 2016-08-05 MED ORDER — VANCOMYCIN HCL IN DEXTROSE 1-5 GM/200ML-% IV SOLN
1000.0000 mg | Freq: Once | INTRAVENOUS | Status: AC
  Administered 2016-08-05: 1000 mg via INTRAVENOUS
  Filled 2016-08-05: qty 200

## 2016-08-05 MED ORDER — CHLORHEXIDINE GLUCONATE 0.12% ORAL RINSE (MEDLINE KIT)
15.0000 mL | Freq: Two times a day (BID) | OROMUCOSAL | Status: DC
  Administered 2016-08-05 – 2016-08-06 (×3): 15 mL via OROMUCOSAL

## 2016-08-05 MED ORDER — METOCLOPRAMIDE HCL 5 MG/ML IJ SOLN
10.0000 mg | Freq: Four times a day (QID) | INTRAMUSCULAR | Status: AC
  Administered 2016-08-05 – 2016-08-06 (×3): 10 mg via INTRAVENOUS
  Filled 2016-08-05 (×2): qty 2

## 2016-08-05 MED ORDER — MORPHINE SULFATE (PF) 4 MG/ML IV SOLN
2.0000 mg | INTRAVENOUS | Status: DC | PRN
Start: 2016-08-06 — End: 2016-08-07
  Administered 2016-08-05 (×2): 2 mg via INTRAVENOUS
  Administered 2016-08-06 (×4): 4 mg via INTRAVENOUS
  Filled 2016-08-05 (×6): qty 1

## 2016-08-05 MED ORDER — MIDAZOLAM HCL 2 MG/2ML IJ SOLN: 2.0000 mg | INTRAMUSCULAR | Status: DC | PRN

## 2016-08-05 MED ORDER — ROCURONIUM BROMIDE 10 MG/ML (PF) SYRINGE
PREFILLED_SYRINGE | INTRAVENOUS | Status: DC | PRN
  Administered 2016-08-05: 100 mg via INTRAVENOUS
  Administered 2016-08-05 (×2): 20 mg via INTRAVENOUS
  Administered 2016-08-05: 30 mg via INTRAVENOUS

## 2016-08-05 MED ORDER — ACETAMINOPHEN 500 MG PO TABS
1000.0000 mg | ORAL_TABLET | Freq: Four times a day (QID) | ORAL | Status: DC
  Administered 2016-08-05 – 2016-08-10 (×16): 1000 mg via ORAL
  Filled 2016-08-05 (×17): qty 2

## 2016-08-05 MED ORDER — LABETALOL HCL 5 MG/ML IV SOLN
10.0000 mg | Freq: Once | INTRAVENOUS | Status: AC
  Administered 2016-08-05: 10 mg via INTRAVENOUS
  Filled 2016-08-05: qty 4

## 2016-08-05 MED ORDER — ALBUMIN HUMAN 5 % IV SOLN
250.0000 mL | INTRAVENOUS | Status: AC | PRN
  Administered 2016-08-05: 250 mL via INTRAVENOUS

## 2016-08-05 MED ORDER — ACETAMINOPHEN 160 MG/5ML PO SOLN: 1000.0000 mg | Freq: Four times a day (QID) | ORAL | Status: DC

## 2016-08-05 MED ORDER — LACTATED RINGERS IV SOLN: INTRAVENOUS | Status: DC

## 2016-08-05 MED ORDER — MIDAZOLAM HCL 10 MG/2ML IJ SOLN
INTRAMUSCULAR | Status: AC
  Filled 2016-08-05: qty 2

## 2016-08-05 MED ORDER — HEMOSTATIC AGENTS (NO CHARGE) OPTIME
TOPICAL | Status: DC | PRN
  Administered 2016-08-05 (×2): 1 via TOPICAL

## 2016-08-05 MED ORDER — BISACODYL 10 MG RE SUPP: 10.0000 mg | Freq: Every day | RECTAL | Status: DC

## 2016-08-05 MED ORDER — ACETAMINOPHEN 160 MG/5ML PO SOLN: 650.0000 mg | Freq: Once | ORAL | Status: AC

## 2016-08-05 MED ORDER — ASPIRIN 81 MG PO CHEW
324.0000 mg | CHEWABLE_TABLET | Freq: Every day | ORAL | Status: DC
  Filled 2016-08-05: qty 4

## 2016-08-05 MED ORDER — PROPOFOL 10 MG/ML IV BOLUS
INTRAVENOUS | Status: AC
  Filled 2016-08-05: qty 20

## 2016-08-05 MED FILL — Sodium Bicarbonate IV Soln 8.4%: INTRAVENOUS | Qty: 50 | Status: AC

## 2016-08-05 MED FILL — Heparin Sodium (Porcine) Inj 1000 Unit/ML: INTRAMUSCULAR | Qty: 60 | Status: AC

## 2016-08-05 MED FILL — Lidocaine HCl IV Inj 20 MG/ML: INTRAVENOUS | Qty: 5 | Status: AC

## 2016-08-05 MED FILL — Electrolyte-R (PH 7.4) Solution: INTRAVENOUS | Qty: 3000 | Status: AC

## 2016-08-05 MED FILL — Magnesium Sulfate Inj 50%: INTRAMUSCULAR | Qty: 10 | Status: AC

## 2016-08-05 MED FILL — Sodium Chloride IV Soln 0.9%: INTRAVENOUS | Qty: 3000 | Status: AC

## 2016-08-05 MED FILL — Mannitol IV Soln 20%: INTRAVENOUS | Qty: 500 | Status: AC

## 2016-08-05 MED FILL — Heparin Sodium (Porcine) Inj 1000 Unit/ML: INTRAMUSCULAR | Qty: 30 | Status: AC

## 2016-08-05 MED FILL — Potassium Chloride Inj 2 mEq/ML: INTRAVENOUS | Qty: 10 | Status: AC

## 2016-08-05 SURGICAL SUPPLY — 74 items
BAG DECANTER FOR FLEXI CONT (MISCELLANEOUS) ×3 IMPLANT
BANDAGE ACE 4X5 VEL STRL LF (GAUZE/BANDAGES/DRESSINGS) ×3 IMPLANT
BANDAGE ACE 6X5 VEL STRL LF (GAUZE/BANDAGES/DRESSINGS) ×3 IMPLANT
BLADE STERNUM SYSTEM 6 (BLADE) ×3 IMPLANT
BLADE SURG 11 STRL SS (BLADE) ×3 IMPLANT
BLOOD HAEMOCONCENTR 700 MIDI (MISCELLANEOUS) ×3 IMPLANT
BNDG GAUZE ELAST 4 BULKY (GAUZE/BANDAGES/DRESSINGS) ×3 IMPLANT
CANISTER SUCT 3000ML PPV (MISCELLANEOUS) ×3 IMPLANT
CATH CPB KIT GERHARDT (MISCELLANEOUS) ×3 IMPLANT
CATH THORACIC 28FR (CATHETERS) ×3 IMPLANT
CLIP FOGARTY SPRING 6M (CLIP) ×3 IMPLANT
CLIP TI MEDIUM 24 (CLIP) ×3 IMPLANT
CRADLE DONUT ADULT HEAD (MISCELLANEOUS) ×3 IMPLANT
DERMABOND ADVANCED (GAUZE/BANDAGES/DRESSINGS) ×2
DERMABOND ADVANCED .7 DNX12 (GAUZE/BANDAGES/DRESSINGS) ×4 IMPLANT
DRAIN CHANNEL 28F RND 3/8 FF (WOUND CARE) ×3 IMPLANT
DRAPE CARDIOVASCULAR INCISE (DRAPES) ×1
DRAPE SLUSH/WARMER DISC (DRAPES) ×3 IMPLANT
DRAPE SRG 135X102X78XABS (DRAPES) ×2 IMPLANT
DRSG AQUACEL AG ADV 3.5X14 (GAUZE/BANDAGES/DRESSINGS) ×3 IMPLANT
ELECT BLADE 4.0 EZ CLEAN MEGAD (MISCELLANEOUS) ×3
ELECT REM PT RETURN 9FT ADLT (ELECTROSURGICAL) ×6
ELECTRODE BLDE 4.0 EZ CLN MEGD (MISCELLANEOUS) ×2 IMPLANT
ELECTRODE REM PT RTRN 9FT ADLT (ELECTROSURGICAL) ×4 IMPLANT
FELT TEFLON 1X6 (MISCELLANEOUS) ×3 IMPLANT
GAUZE SPONGE 4X4 12PLY STRL (GAUZE/BANDAGES/DRESSINGS) ×6 IMPLANT
GAUZE SPONGE 4X4 12PLY STRL LF (GAUZE/BANDAGES/DRESSINGS) ×9 IMPLANT
GLOVE BIO SURGEON STRL SZ 6.5 (GLOVE) ×21 IMPLANT
GLOVE BIOGEL PI IND STRL 6 (GLOVE) ×12 IMPLANT
GLOVE BIOGEL PI INDICATOR 6 (GLOVE) ×6
GOWN STRL REUS W/ TWL LRG LVL3 (GOWN DISPOSABLE) ×20 IMPLANT
GOWN STRL REUS W/TWL LRG LVL3 (GOWN DISPOSABLE) ×10
HEMOSTAT POWDER SURGIFOAM 1G (HEMOSTASIS) ×9 IMPLANT
HEMOSTAT SURGICEL 2X14 (HEMOSTASIS) ×3 IMPLANT
KIT BASIN OR (CUSTOM PROCEDURE TRAY) ×3 IMPLANT
KIT CATH SUCT 8FR (CATHETERS) ×3 IMPLANT
KIT ROOM TURNOVER OR (KITS) ×3 IMPLANT
KIT SUCTION CATH 14FR (SUCTIONS) ×6 IMPLANT
KIT VASOVIEW HEMOPRO VH 3000 (KITS) ×3 IMPLANT
LEAD PACING MYOCARDI (MISCELLANEOUS) ×3 IMPLANT
MARKER GRAFT CORONARY BYPASS (MISCELLANEOUS) ×9 IMPLANT
NS IRRIG 1000ML POUR BTL (IV SOLUTION) ×18 IMPLANT
PACK OPEN HEART (CUSTOM PROCEDURE TRAY) ×3 IMPLANT
PAD ARMBOARD 7.5X6 YLW CONV (MISCELLANEOUS) ×6 IMPLANT
PAD ELECT DEFIB RADIOL ZOLL (MISCELLANEOUS) ×3 IMPLANT
PENCIL BUTTON HOLSTER BLD 10FT (ELECTRODE) ×3 IMPLANT
PUNCH AORTIC ROTATE  4.5MM 8IN (MISCELLANEOUS) ×3 IMPLANT
SET CARDIOPLEGIA MPS 5001102 (MISCELLANEOUS) ×3 IMPLANT
SOLUTION ANTI FOG 6CC (MISCELLANEOUS) ×3 IMPLANT
SUT BONE WAX W31G (SUTURE) ×3 IMPLANT
SUT ETHILON 3 0 FSL (SUTURE) ×3 IMPLANT
SUT MNCRL AB 4-0 PS2 18 (SUTURE) ×6 IMPLANT
SUT PROLENE 3 0 SH1 36 (SUTURE) ×3 IMPLANT
SUT PROLENE 4 0 TF (SUTURE) ×6 IMPLANT
SUT PROLENE 6 0 C 1 30 (SUTURE) ×3 IMPLANT
SUT PROLENE 6 0 CC (SUTURE) ×12 IMPLANT
SUT PROLENE 7 0 BV 1 (SUTURE) ×3 IMPLANT
SUT PROLENE 7 0 BV1 MDA (SUTURE) ×9 IMPLANT
SUT PROLENE 8 0 BV175 6 (SUTURE) ×9 IMPLANT
SUT STEEL 6MS V (SUTURE) ×3 IMPLANT
SUT STEEL SZ 6 DBL 3X14 BALL (SUTURE) ×3 IMPLANT
SUT VIC AB 1 CTX 18 (SUTURE) ×6 IMPLANT
SUT VIC AB 2-0 CT1 27 (SUTURE) ×2
SUT VIC AB 2-0 CT1 TAPERPNT 27 (SUTURE) ×4 IMPLANT
SUTURE E-PAK OPEN HEART (SUTURE) ×3 IMPLANT
SYSTEM SAHARA CHEST DRAIN ATS (WOUND CARE) ×3 IMPLANT
TAPE CLOTH SURG 4X10 WHT LF (GAUZE/BANDAGES/DRESSINGS) ×6 IMPLANT
TAPE PAPER 2X10 WHT MICROPORE (GAUZE/BANDAGES/DRESSINGS) ×3 IMPLANT
TOWEL GREEN STERILE (TOWEL DISPOSABLE) ×6 IMPLANT
TOWEL GREEN STERILE FF (TOWEL DISPOSABLE) IMPLANT
TRAY FOLEY SILVER 16FR TEMP (SET/KITS/TRAYS/PACK) ×3 IMPLANT
TUBING INSUFFLATION (TUBING) ×3 IMPLANT
UNDERPAD 30X30 (UNDERPADS AND DIAPERS) ×3 IMPLANT
WATER STERILE IRR 1000ML POUR (IV SOLUTION) ×6 IMPLANT

## 2016-08-05 NOTE — Plan of Care (Signed)
Problem: Safety: Goal: Ability to remain free from injury will improve Outcome: Progressing No injuries, falls or skin breakdown this shift. Fall risk bundle in place.

## 2016-08-05 NOTE — Progress Notes (Signed)
      [address]       [place] [address]             [phone]     Pre Procedure note for inpatients:   [name] has been scheduled for Procedure(s): CORONARY ARTERY BYPASS GRAFTING (CABG) (N/A) TRANSESOPHAGEAL ECHOCARDIOGRAM (TEE) (N/A) today. The various methods of treatment have been discussed with the patient. After consideration of the risks, benefits and treatment options the patient has consented to the planned procedure. Patient aware of increased risk of postop renal failure due to chronic kidney disease   The patient has been seen and labs reviewed. There are no changes in the patient's condition to prevent proceeding with the planned procedure today.  Recent labs:  Lab Results  Component Value Date   WBC 7.0 08/05/2016   HGB 8.3 (L) 08/05/2016   HCT 26.6 (L) 08/05/2016   PLT 229 08/05/2016   GLUCOSE 192 (H) 08/05/2016   CHOL 99 07/26/2016   TRIG 80 07/26/2016   HDL 38 (L) 07/26/2016   LDLCALC 45 07/26/2016   ALT 37 08/04/2016   AST 31 08/04/2016   NA 136 08/05/2016   K 3.6 08/05/2016   CL 105 08/05/2016   CREATININE 2.99 (H) 08/05/2016   BUN 42 (H) 08/05/2016   CO2 22 08/05/2016   TSH 0.565 07/25/2016   INR 1.12 08/05/2016   HGBA1C 6.6 (H) 07/25/2016    [name], MD 08/05/2016 7:12 AM

## 2016-08-05 NOTE — Progress Notes (Signed)
  Echocardiogram Echocardiogram Transesophageal has been performed.  [name] 08/05/2016, 9:37 AM

## 2016-08-05 NOTE — Progress Notes (Signed)
CT surgery p.m. Rounds  Patient hypertensive after CABG earlier today Ventilator wean to extubate in progress Adequate urine output

## 2016-08-05 NOTE — Anesthesia Procedure Notes (Signed)
Central Venous Catheter Insertion Performed by: Marcie Bal Margalit Leece, anesthesiologist Start/End6/22/2018 7:02 AM, 08/05/2016 7:14 AM Patient location: Pre-op. Preanesthetic checklist: patient identified, IV checked, site marked, risks and benefits discussed, surgical consent, monitors and equipment checked, pre-op evaluation, timeout performed and anesthesia consent Position: Trendelenburg Lidocaine 1% used for infiltration and patient sedated Hand hygiene performed , maximum sterile barriers used  and Seldinger technique used Catheter size: 9 Fr Central line was placed.Sheath introducer Swan type:thermodilation Procedure performed using ultrasound guided technique. Ultrasound Notes:anatomy identified, needle tip was noted to be adjacent to the nerve/plexus identified, no ultrasound evidence of intravascular and/or intraneural injection and image(s) printed for medical record Attempts: 1 Following insertion, line sutured, dressing applied and Biopatch. Post procedure assessment: blood return through all ports, free fluid flow and no air  Patient tolerated the procedure well with no immediate complications.

## 2016-08-05 NOTE — Brief Op Note (Addendum)
      [address]       [place] [address]             [phone]       08/05/2016  11:54 AM  PATIENT:  [name]  71 y.o. male  PRE-OPERATIVE DIAGNOSIS:  CAD recent MI  POST-OPERATIVE DIAGNOSIS:  Same   PROCEDURE:  Procedure(s):  CORONARY ARTERY BYPASS GRAFTING x 4 -LIMA to LAD -SVG to DIAGONAL -SVG to OM1 -SVG to DISTAL RCA  ENDOSCOPIC HARVEST GREATER SAPHENOUS VEIN -Right Leg -Left Leg explored, no vein harvested  TRANSESOPHAGEAL ECHOCARDIOGRAM (TEE) (N/A)  SURGEON:  Surgeon(s) and Role:    [name], MD - Primary  PHYSICIAN ASSISTANT: Ellwood Handler PA-C  ANESTHESIA:   general  EBL:  Total I/O In: 1000 [I.V.:1000] Out: 60 [Urine:60]  BLOOD ADMINISTERED: 2 unit packed cells, cell saver  DRAINS: Left Pleural Chest Tube, Mediastinal Chest drains a and v wires placed   LOCAL MEDICATIONS USED:  NONE  SPECIMEN:  No Specimen  DISPOSITION OF SPECIMEN:  N/A  COUNTS:  YES   DICTATION: .Dragon Dictation  PLAN OF CARE: Admit to inpatient   PATIENT DISPOSITION:  ICU - intubated and hemodynamically stable.   Delay start of Pharmacological VTE agent (>24hrs) due to surgical blood loss or risk of bleeding: yes

## 2016-08-05 NOTE — Progress Notes (Signed)
Pt placed on PRVC RR 12 per Dr. Prescott Gum.

## 2016-08-05 NOTE — Procedures (Signed)
Extubation Procedure Note Pt completed Cardiac Rapid Wean without complication. Pt follows all commands. ABG acceptable. NIF -28, VC 1.1L, cuff leak +.  Pt extubated to 4L [place] (Sats 98%), no stridor. Pt A&O to place, speaks name clearly. Clear BBS  Patient Details:   Name: [name] DOB: Nov 16, 1945 MRN: [id number]   Airway Documentation:     Evaluation  O2 sats: stable throughout Complications: No apparent complications Patient did tolerate procedure well. Bilateral Breath Sounds: Clear   Yes  Sharen Hint 08/05/2016, 9:42 PM

## 2016-08-05 NOTE — Anesthesia Preprocedure Evaluation (Signed)
Anesthesia Evaluation  Patient identified by MRN, date of birth, ID band Patient awake    Reviewed: Allergy & Precautions, NPO status , Patient's Chart, lab work & pertinent test results  History of Anesthesia Complications Negative for: history of anesthetic complications  Airway Mallampati: II  TM Distance: >3 FB Neck ROM: Full    Dental  (+) Poor Dentition, Missing   Pulmonary former smoker,    + rhonchi        Cardiovascular hypertension, Pt. on medications + angina + CAD, + Past MI, + Peripheral Vascular Disease and +CHF  (-) dysrhythmias  Rhythm:Regular     Neuro/Psych negative neurological ROS  negative psych ROS   GI/Hepatic negative GI ROS, Neg liver ROS,   Endo/Other  diabetes, Type 2, Insulin DependentHypothyroidism   Renal/GU Renal InsufficiencyRenal disease     Musculoskeletal   Abdominal   Peds  Hematology  (+) anemia ,   Anesthesia Other Findings   Reproductive/Obstetrics                             Anesthesia Physical Anesthesia Plan  ASA: IV  Anesthesia Plan: General   Post-op Pain Management:    Induction: Intravenous  PONV Risk Score and Plan: 2  Airway Management Planned: Oral ETT  Additional Equipment: Arterial line, CVP, PA Cath, TEE and Ultrasound Guidance Line Placement  Intra-op Plan:   Post-operative Plan: Post-operative intubation/ventilation  Informed Consent: I have reviewed the patients History and Physical, chart, labs and discussed the procedure including the risks, benefits and alternatives for the proposed anesthesia with the patient or authorized representative who has indicated his/her understanding and acceptance.   Dental advisory given  Plan Discussed with: CRNA and Surgeon  Anesthesia Plan Comments:         Anesthesia Quick Evaluation

## 2016-08-05 NOTE — Progress Notes (Signed)
RT NOTE:  Cardiac Rapid Wean initiated

## 2016-08-05 NOTE — Anesthesia Procedure Notes (Signed)
Procedure Name: Intubation Date/Time: 08/05/2016 7:52 AM Performed by: Gaylene Brooks Pre-anesthesia Checklist: Patient identified, Emergency Drugs available, Suction available and Patient being monitored Patient Re-evaluated:Patient Re-evaluated prior to inductionOxygen Delivery Method: Circle System Utilized Preoxygenation: Pre-oxygenation with 100% oxygen Intubation Type: IV induction Ventilation: Mask ventilation without difficulty and Oral airway inserted - appropriate to patient size Laryngoscope Size: Miller and 3 Grade View: Grade I Tube type: Oral Number of attempts: 1 Airway Equipment and Method: Stylet and Oral airway Placement Confirmation: ETT inserted through vocal cords under direct vision,  positive ETCO2 and breath sounds checked- equal and bilateral Secured at: 23 cm Tube secured with: Tape Dental Injury: Teeth and Oropharynx as per pre-operative assessment

## 2016-08-05 NOTE — Transfer of Care (Signed)
Immediate Anesthesia Transfer of Care Note  Patient: [name]  Procedure(s) Performed: Procedure(s): CORONARY ARTERY BYPASS GRAFTING (CABG) x 3, LIMA to LAD, SVG to DIAGONAL, SVG to OM1, SVG to DISTAL RCA, USING LEFT MAMMARY ARTERY AND RIGHT GREATER SAPHENOUS VEIN HARVESTED ENDOSCOPICALLY (N/A) TRANSESOPHAGEAL ECHOCARDIOGRAM (TEE) (N/A)  Patient Location: SICU  Anesthesia Type:General  Level of Consciousness: sedated and Patient remains intubated per anesthesia plan  Airway & Oxygen Therapy: Patient remains intubated per anesthesia plan and Patient placed on Ventilator (see vital sign flow sheet for setting)  Post-op Assessment: Report given to RN and Post -op Vital signs reviewed and stable  Post vital signs: Reviewed and stable  Last Vitals:  Vitals:   08/04/16 2354 08/05/16 0508  BP: (!) 152/66 (!) 176/63  Pulse: 72 69  Resp: 15 19  Temp: 37.3 C 37.2 C    Last Pain:  Vitals:   08/05/16 0508  TempSrc: Oral  PainSc:       Patients Stated Pain Goal: 0 (97/74/14 2395)  Complications: No apparent anesthesia complications

## 2016-08-06 ENCOUNTER — Inpatient Hospital Stay (HOSPITAL_COMMUNITY): Payer: Medicare Other

## 2016-08-06 LAB — BASIC METABOLIC PANEL
Anion gap: 7 (ref 5–15)
BUN: 39 mg/dL — AB (ref 6–20)
CALCIUM: 7.9 mg/dL — AB (ref 8.9–10.3)
CO2: 21 mmol/L — AB (ref 22–32)
CREATININE: 2.96 mg/dL — AB (ref 0.61–1.24)
Chloride: 109 mmol/L (ref 101–111)
GFR calc Af Amer: 23 mL/min — ABNORMAL LOW (ref >60)
GFR calc non Af Amer: 20 mL/min — ABNORMAL LOW (ref >60)
GLUCOSE: 119 mg/dL — AB (ref 65–99)
Potassium: 3.9 mmol/L (ref 3.5–5.1)
Sodium: 137 mmol/L (ref 135–145)

## 2016-08-06 LAB — GLUCOSE, CAPILLARY
GLUCOSE-CAPILLARY: 107 mg/dL — AB (ref 65–99)
GLUCOSE-CAPILLARY: 107 mg/dL — AB (ref 65–99)
GLUCOSE-CAPILLARY: 108 mg/dL — AB (ref 65–99)
GLUCOSE-CAPILLARY: 118 mg/dL — AB (ref 65–99)
GLUCOSE-CAPILLARY: 128 mg/dL — AB (ref 65–99)
GLUCOSE-CAPILLARY: 151 mg/dL — AB (ref 65–99)
Glucose-Capillary: 122 mg/dL — ABNORMAL HIGH (ref 65–99)
Glucose-Capillary: 127 mg/dL — ABNORMAL HIGH (ref 65–99)
Glucose-Capillary: 130 mg/dL — ABNORMAL HIGH (ref 65–99)
Glucose-Capillary: 139 mg/dL — ABNORMAL HIGH (ref 65–99)
Glucose-Capillary: 222 mg/dL — ABNORMAL HIGH (ref 65–99)
Glucose-Capillary: 228 mg/dL — ABNORMAL HIGH (ref 65–99)
Glucose-Capillary: 120 mg/dL — ABNORMAL HIGH (ref 65–99)

## 2016-08-06 LAB — POCT I-STAT, CHEM 8
BUN: 39 mg/dL — AB (ref 6–20)
CALCIUM ION: 1.19 mmol/L (ref 1.15–1.40)
CREATININE: 3.1 mg/dL — AB (ref 0.61–1.24)
Chloride: 103 mmol/L (ref 101–111)
Glucose, Bld: 214 mg/dL — ABNORMAL HIGH (ref 65–99)
HCT: 28 % — ABNORMAL LOW (ref 39.0–52.0)
Hemoglobin: 9.5 g/dL — ABNORMAL LOW (ref 13.0–17.0)
Potassium: 4.6 mmol/L (ref 3.5–5.1)
Sodium: 137 mmol/L (ref 135–145)
TCO2: 21 mmol/L (ref 0–100)

## 2016-08-06 LAB — CBC
HEMATOCRIT: 26.1 % — AB (ref 39.0–52.0)
HEMATOCRIT: 27.6 % — AB (ref 39.0–52.0)
Hemoglobin: 8.3 g/dL — ABNORMAL LOW (ref 13.0–17.0)
Hemoglobin: 8.8 g/dL — ABNORMAL LOW (ref 13.0–17.0)
MCH: 28.7 pg (ref 26.0–34.0)
MCH: 29 pg (ref 26.0–34.0)
MCHC: 31.8 g/dL (ref 30.0–36.0)
MCHC: 31.9 g/dL (ref 30.0–36.0)
MCV: 90.3 fL (ref 78.0–100.0)
MCV: 91.1 fL (ref 78.0–100.0)
Platelets: 167 10*3/uL (ref 150–400)
Platelets: 160 10*3/uL (ref 150–400)
RBC: 2.89 MIL/uL — ABNORMAL LOW (ref 4.22–5.81)
RBC: 3.03 MIL/uL — ABNORMAL LOW (ref 4.22–5.81)
RDW: 15.4 % (ref 11.5–15.5)
RDW: 15.9 % — AB (ref 11.5–15.5)
WBC: 10.7 10*3/uL — ABNORMAL HIGH (ref 4.0–10.5)
WBC: 12.8 10*3/uL — AB (ref 4.0–10.5)

## 2016-08-06 LAB — MAGNESIUM
Magnesium: 2 mg/dL (ref 1.7–2.4)
Magnesium: 2 mg/dL (ref 1.7–2.4)

## 2016-08-06 LAB — HEMOGLOBIN A1C
HEMOGLOBIN A1C: 7.1 % — AB (ref 4.8–5.6)
Mean Plasma Glucose: 157 mg/dL

## 2016-08-06 LAB — CREATININE, SERUM
Creatinine, Ser: 3.27 mg/dL — ABNORMAL HIGH (ref 0.61–1.24)
GFR calc non Af Amer: 18 mL/min — ABNORMAL LOW (ref >60)
GFR, EST AFRICAN AMERICAN: 20 mL/min — AB (ref >60)

## 2016-08-06 MED ORDER — INSULIN ASPART 100 UNIT/ML ~~LOC~~ SOLN
3.0000 [IU] | Freq: Three times a day (TID) | SUBCUTANEOUS | Status: DC
  Administered 2016-08-06 – 2016-08-17 (×31): 3 [IU] via SUBCUTANEOUS

## 2016-08-06 MED ORDER — DOXAZOSIN MESYLATE 2 MG PO TABS
4.0000 mg | ORAL_TABLET | Freq: Two times a day (BID) | ORAL | Status: DC
  Administered 2016-08-06 – 2016-08-17 (×23): 4 mg via ORAL
  Filled 2016-08-06: qty 1
  Filled 2016-08-06 (×4): qty 2
  Filled 2016-08-06 (×2): qty 1
  Filled 2016-08-06: qty 2
  Filled 2016-08-06: qty 1
  Filled 2016-08-06: qty 2
  Filled 2016-08-06 (×2): qty 1
  Filled 2016-08-06 (×3): qty 2
  Filled 2016-08-06: qty 1
  Filled 2016-08-06: qty 2
  Filled 2016-08-06: qty 1
  Filled 2016-08-06 (×2): qty 2
  Filled 2016-08-06: qty 1
  Filled 2016-08-06 (×2): qty 2
  Filled 2016-08-06: qty 1

## 2016-08-06 MED ORDER — METOPROLOL TARTRATE 25 MG PO TABS: 25.0000 mg | ORAL_TABLET | Freq: Two times a day (BID) | ORAL | Status: DC

## 2016-08-06 MED ORDER — METOCLOPRAMIDE HCL 5 MG/ML IJ SOLN
10.0000 mg | Freq: Four times a day (QID) | INTRAMUSCULAR | Status: AC
  Administered 2016-08-06 – 2016-08-07 (×4): 10 mg via INTRAVENOUS
  Filled 2016-08-06 (×4): qty 2

## 2016-08-06 MED ORDER — CARVEDILOL 25 MG PO TABS
25.0000 mg | ORAL_TABLET | Freq: Two times a day (BID) | ORAL | Status: DC
  Administered 2016-08-06 – 2016-08-17 (×23): 25 mg via ORAL
  Filled 2016-08-06 (×8): qty 1
  Filled 2016-08-06: qty 2
  Filled 2016-08-06 (×14): qty 1

## 2016-08-06 MED ORDER — NITROGLYCERIN 0.6 MG/HR TD PT24
0.6000 mg | MEDICATED_PATCH | Freq: Every day | TRANSDERMAL | Status: DC
  Administered 2016-08-06 – 2016-08-09 (×4): 0.6 mg via TRANSDERMAL
  Filled 2016-08-06 (×5): qty 1

## 2016-08-06 MED ORDER — INSULIN ASPART 100 UNIT/ML ~~LOC~~ SOLN
0.0000 [IU] | SUBCUTANEOUS | Status: DC
  Administered 2016-08-06: 2 [IU] via SUBCUTANEOUS
  Administered 2016-08-06 (×2): 8 [IU] via SUBCUTANEOUS
  Administered 2016-08-06: 2 [IU] via SUBCUTANEOUS
  Administered 2016-08-07: 4 [IU] via SUBCUTANEOUS
  Administered 2016-08-07: 2 [IU] via SUBCUTANEOUS
  Administered 2016-08-07 (×2): 4 [IU] via SUBCUTANEOUS
  Administered 2016-08-08: 2 [IU] via SUBCUTANEOUS
  Administered 2016-08-08: 8 [IU] via SUBCUTANEOUS
  Administered 2016-08-08: 4 [IU] via SUBCUTANEOUS
  Administered 2016-08-08 – 2016-08-10 (×8): 2 [IU] via SUBCUTANEOUS

## 2016-08-06 MED ORDER — AMLODIPINE BESYLATE 5 MG PO TABS
5.0000 mg | ORAL_TABLET | Freq: Every day | ORAL | Status: DC
  Administered 2016-08-06 – 2016-08-08 (×3): 5 mg via ORAL
  Filled 2016-08-06 (×3): qty 1

## 2016-08-06 MED ORDER — METOPROLOL TARTRATE 25 MG/10 ML ORAL SUSPENSION: 12.5000 mg | Freq: Two times a day (BID) | ORAL | Status: DC

## 2016-08-06 MED ORDER — FUROSEMIDE 10 MG/ML IJ SOLN
40.0000 mg | Freq: Two times a day (BID) | INTRAMUSCULAR | Status: DC
  Administered 2016-08-06 – 2016-08-09 (×8): 40 mg via INTRAVENOUS
  Filled 2016-08-06 (×8): qty 4

## 2016-08-06 MED ORDER — TAMSULOSIN HCL 0.4 MG PO CAPS
0.4000 mg | ORAL_CAPSULE | Freq: Every day | ORAL | Status: DC
  Administered 2016-08-06 – 2016-08-17 (×12): 0.4 mg via ORAL
  Filled 2016-08-06 (×12): qty 1

## 2016-08-06 NOTE — Progress Notes (Signed)
1 Day Post-Op Procedure(s) (LRB): CORONARY ARTERY BYPASS GRAFTING (CABG) x 3, LIMA to LAD, SVG to DIAGONAL, SVG to OM1, SVG to DISTAL RCA, USING LEFT MAMMARY ARTERY AND RIGHT GREATER SAPHENOUS VEIN HARVESTED ENDOSCOPICALLY (N/A) TRANSESOPHAGEAL ECHOCARDIOGRAM (TEE) (N/A) Subjective: Very weak- PT ordered Urine output 500cc/shift  Objective: Vital signs in last 24 hours: Temp:  [98.1 F (36.7 C)-100.6 F (38.1 C)] 98.5 F (36.9 C) (06/23 1600) Pulse Rate:  [73-107] 75 (06/23 1700) Cardiac Rhythm: Normal sinus rhythm (06/23 1600) Resp:  [0-34] 13 (06/23 1700) BP: (108-168)/(48-107) 156/63 (06/23 1600) SpO2:  [91 %-100 %] 93 % (06/23 1700) Arterial Line BP: (130-204)/(42-75) 152/50 (06/23 0900) FiO2 (%):  [40 %] 40 % (06/22 2108) Weight:  [213 lb 10 oz (96.9 kg)] 213 lb 10 oz (96.9 kg) (06/23 0500)  Hemodynamic parameters for last 24 hours: PAP: (11-65)/(2-37) 20/11 CO:  [5.4 L/min-7.7 L/min] 5.5 L/min CI:  [2.5 L/min/m2-3.5 L/min/m2] 2.5 L/min/m2  Intake/Output from previous day: 06/22 0701 - 06/23 0700 In: 5128.3 [I.V.:3728.3; Blood:800; IV Piggyback:600] Out: 4560 [Urine:1430; Blood:1000; Chest Tube:330] Intake/Output this shift: Total I/O In: 1204.1 [P.O.:720; I.V.:484.1] Out: 430 [Urine:320; Chest Tube:110]  No focal weakness  Lab Results:  Recent Labs  08/06/16 0310 08/06/16 1707  WBC 10.7* 12.8*  HGB 8.3* 8.8*  HCT 26.1* 27.6*  PLT 167 160   BMET:  Recent Labs  08/05/16 0502  08/05/16 2000 08/06/16 0310  NA 136  < > 137 137  K 3.6  < > 4.2 3.9  CL 105  < > 110 109  CO2 22  --   --  21*  GLUCOSE 192*  < > 147* 119*  BUN 42*  < > 36* 39*  CREATININE 2.99*  < > 2.70* 2.96*  CALCIUM 9.2  --   --  7.9*  < > = values in this interval not displayed.  PT/INR:  Recent Labs  08/05/16 1423  LABPROT 16.0*  INR 1.27   ABG    Component Value Date/Time   PHART 7.423 08/05/2016 2234   HCO3 20.2 08/05/2016 2234   TCO2 21 08/05/2016 2234   ACIDBASEDEF  3.0 (H) 08/05/2016 2234   O2SAT 93.0 08/05/2016 2234   CBG (last 3)   Recent Labs  08/06/16 0804 08/06/16 1221 08/06/16 1639  GLUCAP 139* 151* 222*    Assessment/Plan: S/P Procedure(s) (LRB): CORONARY ARTERY BYPASS GRAFTING (CABG) x 3, LIMA to LAD, SVG to DIAGONAL, SVG to OM1, SVG to DISTAL RCA, USING LEFT MAMMARY ARTERY AND RIGHT GREATER SAPHENOUS VEIN HARVESTED ENDOSCOPICALLY (N/A) TRANSESOPHAGEAL ECHOCARDIOGRAM (TEE) (N/A) Lasix BID   LOS: 11 days    [name] 08/06/2016

## 2016-08-06 NOTE — Progress Notes (Signed)
1 Day Post-Op Procedure(s) (LRB): CORONARY ARTERY BYPASS GRAFTING (CABG) x 3, LIMA to LAD, SVG to DIAGONAL, SVG to OM1, SVG to DISTAL RCA, USING LEFT MAMMARY ARTERY AND RIGHT GREATER SAPHENOUS VEIN HARVESTED ENDOSCOPICALLY (N/A) TRANSESOPHAGEAL ECHOCARDIOGRAM (TEE) (N/A) Subjective: Stable after cabg Renal fx stable Objective: Vital signs in last 24 hours: Temp:  [96.6 F (35.9 C)-100.6 F (38.1 C)] 99 F (37.2 C) (06/23 0800) Pulse Rate:  [75-107] 82 (06/23 0800) Cardiac Rhythm: Heart block;Normal sinus rhythm (06/23 0600) Resp:  [0-34] 20 (06/23 0800) BP: (108-168)/(50-107) 140/65 (06/23 0800) SpO2:  [94 %-100 %] 97 % (06/23 0800) Arterial Line BP: (125-204)/(42-75) 162/46 (06/23 0800) FiO2 (%):  [40 %-50 %] 40 % (06/22 2108) Weight:  [213 lb 10 oz (96.9 kg)] 213 lb 10 oz (96.9 kg) (06/23 0500)  Hemodynamic parameters for last 24 hours: PAP: (11-65)/(2-37) 22/8 CO:  [4.4 L/min-8.2 L/min] 5.5 L/min CI:  [2 L/min/m2-3.8 L/min/m2] 2.5 L/min/m2  Intake/Output from previous day: 06/22 0701 - 06/23 0700 In: 5128.3 [I.V.:3728.3; Blood:800; IV Piggyback:600] Out: 4560 [Urine:1430; Blood:1000; Chest Tube:330] Intake/Output this shift: Total I/O In: 68.4 [I.V.:68.4] Out: 60 [Urine:40; Chest Tube:20]       Exam    General- alert and comfortable   Lungs- clear without rales, wheezes   Cor- regular rate and rhythm, no murmur , gallop   Abdomen- soft, non-tender   Extremities - warm, non-tender, minimal edema   Neuro- oriented, appropriate, no focal weakness   Lab Results:  Recent Labs  08/05/16 1958 08/05/16 2000 08/06/16 0310  WBC 12.0*  --  10.7*  HGB 9.7* 9.5* 8.3*  HCT 29.7* 28.0* 26.1*  PLT 174  --  167   BMET:  Recent Labs  08/05/16 0502  08/05/16 2000 08/06/16 0310  NA 136  < > 137 137  K 3.6  < > 4.2 3.9  CL 105  < > 110 109  CO2 22  --   --  21*  GLUCOSE 192*  < > 147* 119*  BUN 42*  < > 36* 39*  CREATININE 2.99*  < > 2.70* 2.96*  CALCIUM 9.2  --    --  7.9*  < > = values in this interval not displayed.  PT/INR:  Recent Labs  08/05/16 1423  LABPROT 16.0*  INR 1.27   ABG    Component Value Date/Time   PHART 7.423 08/05/2016 2234   HCO3 20.2 08/05/2016 2234   TCO2 21 08/05/2016 2234   ACIDBASEDEF 3.0 (H) 08/05/2016 2234   O2SAT 93.0 08/05/2016 2234   CBG (last 3)   Recent Labs  08/06/16 0517 08/06/16 0609 08/06/16 0651  GLUCAP 127* 128* 130*    Assessment/Plan: S/P Procedure(s) (LRB): CORONARY ARTERY BYPASS GRAFTING (CABG) x 3, LIMA to LAD, SVG to DIAGONAL, SVG to OM1, SVG to DISTAL RCA, USING LEFT MAMMARY ARTERY AND RIGHT GREATER SAPHENOUS VEIN HARVESTED ENDOSCOPICALLY (N/A) TRANSESOPHAGEAL ECHOCARDIOGRAM (TEE) (N/A) Mobilize Diuresis Diabetes control d/c tubes/lines See progression orders   LOS: 11 days    Tharon Aquas Trigt III 08/06/2016

## 2016-08-07 ENCOUNTER — Inpatient Hospital Stay (HOSPITAL_COMMUNITY): Payer: Medicare Other

## 2016-08-07 LAB — CBC
HCT: 27.2 % — ABNORMAL LOW (ref 39.0–52.0)
Hemoglobin: 8.7 g/dL — ABNORMAL LOW (ref 13.0–17.0)
MCH: 29.3 pg (ref 26.0–34.0)
MCHC: 32 g/dL (ref 30.0–36.0)
MCV: 91.6 fL (ref 78.0–100.0)
Platelets: 158 10*3/uL (ref 150–400)
RBC: 2.97 MIL/uL — ABNORMAL LOW (ref 4.22–5.81)
RDW: 15.8 % — ABNORMAL HIGH (ref 11.5–15.5)
WBC: 12.7 10*3/uL — ABNORMAL HIGH (ref 4.0–10.5)

## 2016-08-07 LAB — BASIC METABOLIC PANEL
Anion gap: 10 (ref 5–15)
BUN: 43 mg/dL — ABNORMAL HIGH (ref 6–20)
CO2: 21 mmol/L — ABNORMAL LOW (ref 22–32)
Calcium: 8.7 mg/dL — ABNORMAL LOW (ref 8.9–10.3)
Chloride: 105 mmol/L (ref 101–111)
Creatinine, Ser: 3.56 mg/dL — ABNORMAL HIGH (ref 0.61–1.24)
GFR calc Af Amer: 18 mL/min — ABNORMAL LOW (ref >60)
GFR calc non Af Amer: 16 mL/min — ABNORMAL LOW (ref >60)
Glucose, Bld: 107 mg/dL — ABNORMAL HIGH (ref 65–99)
Potassium: 4.3 mmol/L (ref 3.5–5.1)
Sodium: 136 mmol/L (ref 135–145)

## 2016-08-07 LAB — GLUCOSE, CAPILLARY
GLUCOSE-CAPILLARY: 105 mg/dL — AB (ref 65–99)
GLUCOSE-CAPILLARY: 171 mg/dL — AB (ref 65–99)
GLUCOSE-CAPILLARY: 193 mg/dL — AB (ref 65–99)
Glucose-Capillary: 156 mg/dL — ABNORMAL HIGH (ref 65–99)
Glucose-Capillary: 214 mg/dL — ABNORMAL HIGH (ref 65–99)

## 2016-08-07 MED ORDER — METOCLOPRAMIDE HCL 5 MG/ML IJ SOLN
10.0000 mg | Freq: Four times a day (QID) | INTRAMUSCULAR | Status: AC
  Administered 2016-08-07 – 2016-08-08 (×3): 10 mg via INTRAVENOUS
  Filled 2016-08-07 (×3): qty 2

## 2016-08-07 NOTE — Progress Notes (Signed)
2 Days Post-Op Procedure(s) (LRB): CORONARY ARTERY BYPASS GRAFTING (CABG) x 3, LIMA to LAD, SVG to DIAGONAL, SVG to OM1, SVG to DISTAL RCA, USING LEFT MAMMARY ARTERY AND RIGHT GREATER SAPHENOUS VEIN HARVESTED ENDOSCOPICALLY (N/A) TRANSESOPHAGEAL ECHOCARDIOGRAM (TEE) (N/A) Subjective: Patient stronger this p.m. and was steady year on his feet Still unable to ambulate in hallway due to general weakness Urine output adequate Blood pressure high for renal perfusion Pulmonary status stable Sinus rhythm  Objective: Vital signs in last 24 hours: Temp:  [98.3 F (36.8 C)-99.3 F (37.4 C)] 98.8 F (37.1 C) (06/24 1500) Pulse Rate:  [74-96] 86 (06/24 1800) Cardiac Rhythm: Normal sinus rhythm;Bundle branch block;Heart block (06/24 1600) Resp:  [8-27] 8 (06/24 1800) BP: (130-159)/(59-79) 136/74 (06/24 1800) SpO2:  [93 %-97 %] 94 % (06/24 1800) Weight:  [213 lb 3 oz (96.7 kg)] 213 lb 3 oz (96.7 kg) (06/24 0500)  Hemodynamic parameters for last 24 hours:    Intake/Output from previous day: 06/23 0701 - 06/24 0700 In: 1584.1 [P.O.:720; I.V.:764.1; IV Piggyback:100] Out: 1065 [Urine:875; Chest Tube:190] Intake/Output this shift: No intake/output data recorded.  Lungs clear Abdomen soft Extremities warm  Lab Results:  Recent Labs  08/06/16 1707 08/06/16 1724 08/07/16 0358  WBC 12.8*  --  12.7*  HGB 8.8* 9.5* 8.7*  HCT 27.6* 28.0* 27.2*  PLT 160  --  158   BMET:  Recent Labs  08/06/16 0310  08/06/16 1724 08/07/16 0358  NA 137  --  137 136  K 3.9  --  4.6 4.3  CL 109  --  103 105  CO2 21*  --   --  21*  GLUCOSE 119*  --  214* 107*  BUN 39*  --  39* 43*  CREATININE 2.96*  < > 3.10* 3.56*  CALCIUM 7.9*  --   --  8.7*  < > = values in this interval not displayed.  PT/INR:  Recent Labs  08/05/16 1423  LABPROT 16.0*  INR 1.27   ABG    Component Value Date/Time   PHART 7.423 08/05/2016 2234   HCO3 20.2 08/05/2016 2234   TCO2 21 08/06/2016 1724   ACIDBASEDEF 3.0  (H) 08/05/2016 2234   O2SAT 93.0 08/05/2016 2234   CBG (last 3)   Recent Labs  08/07/16 0826 08/07/16 1155 08/07/16 1617  GLUCAP 156* 171* 193*    Assessment/Plan: S/P Procedure(s) (LRB): CORONARY ARTERY BYPASS GRAFTING (CABG) x 3, LIMA to LAD, SVG to DIAGONAL, SVG to OM1, SVG to DISTAL RCA, USING LEFT MAMMARY ARTERY AND RIGHT GREATER SAPHENOUS VEIN HARVESTED ENDOSCOPICALLY (N/A) TRANSESOPHAGEAL ECHOCARDIOGRAM (TEE) (N/A) Keep in ICU to monitor renal function Physical therapy is needed to help mobilize the patient Avoid nephrotoxic drugs   LOS: 12 days    Tharon Aquas Trigt III 08/07/2016

## 2016-08-07 NOTE — Progress Notes (Addendum)
2 Days Post-Op Procedure(s) (LRB): CORONARY ARTERY BYPASS GRAFTING (CABG) x 3, LIMA to LAD, SVG to DIAGONAL, SVG to OM1, SVG to DISTAL RCA, USING LEFT MAMMARY ARTERY AND RIGHT GREATER SAPHENOUS VEIN HARVESTED ENDOSCOPICALLY (N/A) TRANSESOPHAGEAL ECHOCARDIOGRAM (TEE) (N/A) Subjective: Very weak- assistx3 Nsr, BP on high side creatn now 3.5- elevated preop 3.7 on admission  cxr clear Objective: Vital signs in last 24 hours: Temp:  [98.5 F (36.9 C)-99.3 F (37.4 C)] 99.2 F (37.3 C) (06/24 0800) Pulse Rate:  [74-96] 84 (06/24 1000) Cardiac Rhythm: Normal sinus rhythm;Bundle branch block;Heart block (06/24 0800) Resp:  [12-27] 15 (06/24 1000) BP: (130-156)/(56-78) 147/66 (06/24 1000) SpO2:  [91 %-97 %] 96 % (06/24 1000) Weight:  [213 lb 3 oz (96.7 kg)] 213 lb 3 oz (96.7 kg) (06/24 0500)  Hemodynamic parameters for last 24 hours:   stable Intake/Output from previous day: 06/23 0701 - 06/24 0700 In: 1584.1 [P.O.:720; I.V.:764.1; IV Piggyback:100] Out: 1065 [Urine:875; Chest Tube:190] Intake/Output this shift: Total I/O In: 120 [P.O.:60; I.V.:60] Out: 55 [Urine:55]       Exam    General- alert and comfortable   Lungs- clear without rales, wheezes   Cor- regular rate and rhythm, no murmur , gallop   Abdomen- soft, non-tender   Extremities - warm, non-tender, minimal edema   Neuro- oriented, appropriate, no focal weakness   Lab Results:  Recent Labs  08/06/16 1707 08/06/16 1724 08/07/16 0358  WBC 12.8*  --  12.7*  HGB 8.8* 9.5* 8.7*  HCT 27.6* 28.0* 27.2*  PLT 160  --  158   BMET:  Recent Labs  08/06/16 0310  08/06/16 1724 08/07/16 0358  NA 137  --  137 136  K 3.9  --  4.6 4.3  CL 109  --  103 105  CO2 21*  --   --  21*  GLUCOSE 119*  --  214* 107*  BUN 39*  --  39* 43*  CREATININE 2.96*  < > 3.10* 3.56*  CALCIUM 7.9*  --   --  8.7*  < > = values in this interval not displayed.  PT/INR:  Recent Labs  08/05/16 1423  LABPROT 16.0*  INR 1.27   ABG     Component Value Date/Time   PHART 7.423 08/05/2016 2234   HCO3 20.2 08/05/2016 2234   TCO2 21 08/06/2016 1724   ACIDBASEDEF 3.0 (H) 08/05/2016 2234   O2SAT 93.0 08/05/2016 2234   CBG (last 3)   Recent Labs  08/06/16 2312 08/07/16 0313 08/07/16 0826  GLUCAP 120* 105* 156*    Assessment/Plan: S/P Procedure(s) (LRB): CORONARY ARTERY BYPASS GRAFTING (CABG) x 3, LIMA to LAD, SVG to DIAGONAL, SVG to OM1, SVG to DISTAL RCA, USING LEFT MAMMARY ARTERY AND RIGHT GREATER SAPHENOUS VEIN HARVESTED ENDOSCOPICALLY (N/A) TRANSESOPHAGEAL ECHOCARDIOGRAM (TEE) (N/A) Mobilize Diuresis Diabetes control Continue foley due to diuresing patient   LOS: 12 days    Tharon Aquas Trigt III 08/07/2016

## 2016-08-08 ENCOUNTER — Encounter (HOSPITAL_COMMUNITY): Payer: Self-pay | Admitting: Cardiothoracic Surgery

## 2016-08-08 ENCOUNTER — Inpatient Hospital Stay (HOSPITAL_COMMUNITY): Payer: Medicare Other

## 2016-08-08 LAB — TYPE AND SCREEN
ABO/RH(D): A POS
Antibody Screen: NEGATIVE
Unit division: 0
Unit division: 0
Unit division: 0
Unit division: 0
Unit division: 0
Unit division: 0

## 2016-08-08 LAB — CBC
HCT: 25.2 % — ABNORMAL LOW (ref 39.0–52.0)
Hemoglobin: 8.2 g/dL — ABNORMAL LOW (ref 13.0–17.0)
MCH: 29.6 pg (ref 26.0–34.0)
MCHC: 32.5 g/dL (ref 30.0–36.0)
MCV: 91 fL (ref 78.0–100.0)
Platelets: 147 10*3/uL — ABNORMAL LOW (ref 150–400)
RBC: 2.77 MIL/uL — ABNORMAL LOW (ref 4.22–5.81)
RDW: 15.5 % (ref 11.5–15.5)
WBC: 11.9 10*3/uL — ABNORMAL HIGH (ref 4.0–10.5)

## 2016-08-08 LAB — GLUCOSE, CAPILLARY
GLUCOSE-CAPILLARY: 171 mg/dL — AB (ref 65–99)
GLUCOSE-CAPILLARY: 149 mg/dL — AB (ref 65–99)
Glucose-Capillary: 126 mg/dL — ABNORMAL HIGH (ref 65–99)
Glucose-Capillary: 168 mg/dL — ABNORMAL HIGH (ref 65–99)

## 2016-08-08 LAB — BASIC METABOLIC PANEL
Anion gap: 9 (ref 5–15)
BUN: 52 mg/dL — ABNORMAL HIGH (ref 6–20)
CO2: 22 mmol/L (ref 22–32)
Calcium: 8.6 mg/dL — ABNORMAL LOW (ref 8.9–10.3)
Chloride: 105 mmol/L (ref 101–111)
Creatinine, Ser: 4.13 mg/dL — ABNORMAL HIGH (ref 0.61–1.24)
GFR calc Af Amer: 15 mL/min — ABNORMAL LOW (ref >60)
GFR calc non Af Amer: 13 mL/min — ABNORMAL LOW (ref >60)
Glucose, Bld: 141 mg/dL — ABNORMAL HIGH (ref 65–99)
Potassium: 4 mmol/L (ref 3.5–5.1)
Sodium: 136 mmol/L (ref 135–145)

## 2016-08-08 LAB — BPAM RBC
Blood Product Expiration Date: 201806252359
Blood Product Expiration Date: 201807042359
Blood Product Expiration Date: 201807042359
Blood Product Expiration Date: 201807112359
Blood Product Expiration Date: 201807112359
Blood Product Expiration Date: 201807112359
ISSUE DATE / TIME: 201806220634
ISSUE DATE / TIME: 201806220634
ISSUE DATE / TIME: 201806221017
ISSUE DATE / TIME: 201806221017
Unit Type and Rh: 6200
Unit Type and Rh: 6200
Unit Type and Rh: 6200
Unit Type and Rh: 6200
Unit Type and Rh: 6200
Unit Type and Rh: 6200

## 2016-08-08 NOTE — Consult Note (Signed)
This is a 71 y.o. male with a hx of CKD with creatinine of 2.65 in 2015 and has been followed by Dr. Hinda Lenis in [place].  His past medical history significant for hypertension, IDDM, BPH, prior herniated disk in 2005 sp L3/L4 diskectomy and fusion, mild bilateral ICA stenosis in 2014 and EF 60-65% with moderate LVH by echo in 2014. He presented 6/11 to an outside facility with chest discomfort, bought to [facility] ([place]), found to have Labs significant for Creatinine of 3.83, Troponin T of 0.26, BNP of 19,808, anemia with H/H 9.1/27.5. CXR showed mild cardiomegaly, CHF/consolidation +- edema/ARDS. Bladder scan showed 700 cc and foley was placed.  Heart cath on 6/14 was done when creat was 3.4.  There was no CIN.  CABG on 6/22 occurred when creat was about 2.8.  There have been no major hemodynamic insults, aside from CABG.  Creat was 3.9 on 07/26/16. Today(6/25) creat is 4.28m/dl.   Past Medical History:  Diagnosis Date  . Carotid artery occlusion   . Chronic kidney disease   . Diabetes mellitus without complication ([place]   . Hypertension    Past Surgical History:  Procedure Laterality Date  . CORONARY ARTERY BYPASS GRAFT N/A 08/05/2016   Procedure: CORONARY ARTERY BYPASS GRAFTING (CABG) x 3, LIMA to LAD, SVG to DIAGONAL, SVG to OM1, SVG to DISTAL RCA, USING LEFT MAMMARY ARTERY AND RIGHT GREATER SAPHENOUS VEIN HARVESTED ENDOSCOPICALLY;  Surgeon: GGrace Isaac MD;  [facility]  Service: Open Heart Surgery;  Laterality: N/A;  . LEFT HEART CATH AND CORONARY ANGIOGRAPHY N/A 07/28/2016   Procedure: Left Heart Cath and Coronary Angiography;  Surgeon: VJettie Booze MD;  [facility];  Service: Cardiovascular;  Laterality: N/A;  . LUMBAR FUSION  2005  . TEE WITHOUT CARDIOVERSION N/A 08/05/2016   Procedure: TRANSESOPHAGEAL ECHOCARDIOGRAM (TEE);  Surgeon: GGrace Isaac MD;  [place] [facility]  Service: Open Heart Surgery;  Laterality: N/A;   Social History:  reports that he has quit  smoking. He has never used smokeless tobacco. He reports that he does not drink alcohol or use drugs. Allergies:  Allergies  Allergen Reactions  . No Known Allergies    Family History  Problem Relation Age of Onset  . Heart disease Mother   . Hypertension Mother   . Hypertension Father     Medications:  Prior to Admission:  Prescriptions Prior to Admission  Medication Sig Dispense Refill Last Dose  . allopurinol (ZYLOPRIM) 100 MG tablet Take 100 mg by mouth daily.  3 07/24/2016 at Unknown time  . AMITIZA 24 MCG capsule Take 24 mcg by mouth 2 (two) times daily.  5 07/24/2016 at Unknown time  . amLODipine (NORVASC) 10 MG tablet Take 10 mg by mouth daily.  2 07/24/2016 at Unknown time  . betamethasone valerate (VALISONE) 0.1 % cream Apply 1 application topically daily.   99 07/24/2016 at Unknown time  . BIDIL 20-37.5 MG tablet Take 1 tablet by mouth 2 (two) times daily.  11 07/24/2016 at Unknown time  . carvedilol (COREG) 25 MG tablet Take 25 mg by mouth 2 (two) times daily.  3 07/24/2016 at 600 pm  . [employer] VITAMIN D 2000 units CAPS Take 1 capsule by mouth daily.  5 07/24/2016 at Unknown time  . doxazosin (CARDURA) 4 MG tablet Take 4 mg by mouth 2 (two) times daily.  2 07/24/2016 at Unknown time  . fluticasone (FLONASE) 50 MCG/ACT nasal spray USE 2 SPRAYS IN EACH NOSTRIL AT BEDTIME  9 07/24/2016 at Unknown time  . furosemide (LASIX) 80 MG tablet Take 80 mg by mouth daily.  2 07/24/2016 at Unknown time  . HUMULIN 70/30 (70-30) 100 UNIT/ML injection Inject 5-10 Units into the skin 2 (two) times daily with a meal.   9 07/24/2016 at Unknown time  . levothyroxine (SYNTHROID, LEVOTHROID) 100 MCG tablet Take 100 mcg by mouth daily.   07/24/2016 at Unknown time  . rosuvastatin (CRESTOR) 10 MG tablet Take 10 mg by mouth every evening.  3 07/24/2016 at Unknown time  . sodium bicarbonate 650 MG tablet Take 650 mg by mouth 2 (two) times daily.  4 07/24/2016 at Unknown time  . tamsulosin (FLOMAX) 0.4 MG CAPS capsule  Take 0.8 mg by mouth every evening.  9 07/24/2016 at Unknown time   Scheduled: . acetaminophen  1,000 mg Oral Q6H   Or  . acetaminophen (TYLENOL) oral liquid 160 mg/5 mL  1,000 mg Per Tube Q6H  . allopurinol  100 mg Oral Daily  . amLODipine  5 mg Oral Daily  . aspirin EC  325 mg Oral Daily   Or  . aspirin  324 mg Per Tube Daily  . bisacodyl  10 mg Oral Daily   Or  . bisacodyl  10 mg Rectal Daily  . carvedilol  25 mg Oral BID WC  . docusate sodium  200 mg Oral Daily  . doxazosin  4 mg Oral BID  . furosemide  40 mg Intravenous BID  . insulin aspart  0-24 Units Subcutaneous Q4H  . insulin aspart  3 Units Subcutaneous TID WC  . levothyroxine  100 mcg Oral QAC breakfast  . lubiprostone  24 mcg Oral BID  . nitroGLYCERIN  0.6 mg Transdermal Daily  . pantoprazole  40 mg Oral Daily  . rosuvastatin  10 mg Oral QPM  . sodium chloride flush  3 mL Intravenous Q12H  . tamsulosin  0.4 mg Oral Daily    ROS: as per HPI Blood pressure (!) 141/63, pulse 72, temperature 98.3 F (36.8 C), temperature source Oral, resp. rate (!) 24, height _0  (1.854 m), weight 95.9 kg (211 lb 6.7 oz), SpO2 97 %.  General appearance: alert, cooperative and dysarthric Head: Normocephalic, without obvious abnormality, atraumatic Eyes: negative Ears: normal TM's and external ear canals both ears Nose: Nares normal. Septum midline. Mucosa normal. No drainage or sinus tenderness. Throat: lips, mucosa, and tongue normal; teeth and gums normal Resp: clear to auscultation bilaterally Chest wall: no tenderness sternotomy healing Cardio: regular rate and rhythm, S1, S2 normal, no murmur, click, rub or gallop GI: soft, non-tender; bowel sounds normal; no masses,  no organomegaly Extremities: edema tr Skin: Skin color, texture, turgor normal. No rashes or lesions Neurologic: decreased ability to pull up right knee to chest, slight rigth arm drift and sl dysarthria Results for orders placed or performed during the  hospital encounter of 07/25/16 (from the past 48 hour(s))  Glucose, capillary     Status: Abnormal   Collection Time: 08/06/16  7:26 PM  Result Value Ref Range   Glucose-Capillary 228 (H) 65 - 99 mg/dL   Comment 1 Notify RN   Glucose, capillary     Status: Abnormal   Collection Time: 08/06/16 11:12 PM  Result Value Ref Range   Glucose-Capillary 120 (H) 65 - 99 mg/dL   Comment 1 Notify RN   Glucose, capillary     Status: Abnormal   Collection Time: 08/07/16  3:13 AM  Result Value Ref Range  Glucose-Capillary 105 (H) 65 - 99 mg/dL   Comment 1 Notify RN   Basic metabolic panel     Status: Abnormal   Collection Time: 08/07/16  3:58 AM  Result Value Ref Range   Sodium 136 135 - 145 mmol/L   Potassium 4.3 3.5 - 5.1 mmol/L   Chloride 105 101 - 111 mmol/L   CO2 21 (L) 22 - 32 mmol/L   Glucose, Bld 107 (H) 65 - 99 mg/dL   BUN 43 (H) 6 - 20 mg/dL   Creatinine, Ser 3.56 (H) 0.61 - 1.24 mg/dL   Calcium 8.7 (L) 8.9 - 10.3 mg/dL   GFR calc non Af Amer 16 (L) >60 mL/min   GFR calc Af Amer 18 (L) >60 mL/min    Comment: (NOTE) The eGFR has been calculated using the CKD EPI equation. This calculation has not been validated in all clinical situations. eGFR's persistently <60 mL/min signify possible Chronic Kidney Disease.    Anion gap 10 5 - 15  CBC     Status: Abnormal   Collection Time: 08/07/16  3:58 AM  Result Value Ref Range   WBC 12.7 (H) 4.0 - 10.5 K/uL   RBC 2.97 (L) 4.22 - 5.81 MIL/uL   Hemoglobin 8.7 (L) 13.0 - 17.0 g/dL   HCT 27.2 (L) 39.0 - 52.0 %   MCV 91.6 78.0 - 100.0 fL   MCH 29.3 26.0 - 34.0 pg   MCHC 32.0 30.0 - 36.0 g/dL   RDW 15.8 (H) 11.5 - 15.5 %   Platelets 158 150 - 400 K/uL  Glucose, capillary     Status: Abnormal   Collection Time: 08/07/16  8:26 AM  Result Value Ref Range   Glucose-Capillary 156 (H) 65 - 99 mg/dL   Comment 1 Notify RN   Glucose, capillary     Status: Abnormal   Collection Time: 08/07/16 11:55 AM  Result Value Ref Range    Glucose-Capillary 171 (H) 65 - 99 mg/dL  Glucose, capillary     Status: Abnormal   Collection Time: 08/07/16  4:17 PM  Result Value Ref Range   Glucose-Capillary 193 (H) 65 - 99 mg/dL  Glucose, capillary     Status: Abnormal   Collection Time: 08/07/16  9:45 PM  Result Value Ref Range   Glucose-Capillary 214 (H) 65 - 99 mg/dL   Comment 1 Capillary Specimen   Basic metabolic panel     Status: Abnormal   Collection Time: 08/08/16  3:05 AM  Result Value Ref Range   Sodium 136 135 - 145 mmol/L   Potassium 4.0 3.5 - 5.1 mmol/L   Chloride 105 101 - 111 mmol/L   CO2 22 22 - 32 mmol/L   Glucose, Bld 141 (H) 65 - 99 mg/dL   BUN 52 (H) 6 - 20 mg/dL   Creatinine, Ser 4.13 (H) 0.61 - 1.24 mg/dL   Calcium 8.6 (L) 8.9 - 10.3 mg/dL   GFR calc non Af Amer 13 (L) >60 mL/min   GFR calc Af Amer 15 (L) >60 mL/min    Comment: (NOTE) The eGFR has been calculated using the CKD EPI equation. This calculation has not been validated in all clinical situations. eGFR's persistently <60 mL/min signify possible Chronic Kidney Disease.    Anion gap 9 5 - 15  CBC     Status: Abnormal   Collection Time: 08/08/16  3:05 AM  Result Value Ref Range   WBC 11.9 (H) 4.0 - 10.5 K/uL   RBC 2.77 (L)  4.22 - 5.81 MIL/uL   Hemoglobin 8.2 (L) 13.0 - 17.0 g/dL   HCT 25.2 (L) 39.0 - 52.0 %   MCV 91.0 78.0 - 100.0 fL   MCH 29.6 26.0 - 34.0 pg   MCHC 32.5 30.0 - 36.0 g/dL   RDW 15.5 11.5 - 15.5 %   Platelets 147 (L) 150 - 400 K/uL  Glucose, capillary     Status: Abnormal   Collection Time: 08/08/16  4:36 AM  Result Value Ref Range   Glucose-Capillary 126 (H) 65 - 99 mg/dL  Glucose, capillary     Status: Abnormal   Collection Time: 08/08/16  8:09 AM  Result Value Ref Range   Glucose-Capillary 171 (H) 65 - 99 mg/dL   Comment 1 Capillary Specimen   Glucose, capillary     Status: Abnormal   Collection Time: 08/08/16 11:47 AM  Result Value Ref Range   Glucose-Capillary 168 (H) 65 - 99 mg/dL   Comment 1 Capillary  Specimen   Glucose, capillary     Status: Abnormal   Collection Time: 08/08/16  3:45 PM  Result Value Ref Range   Glucose-Capillary 149 (H) 65 - 99 mg/dL   Comment 1 Capillary Specimen    Dg Chest Port 1 View  Result Date: 08/08/2016 CLINICAL DATA:  Chest soreness post CABG EXAM: PORTABLE CHEST 1 VIEW COMPARISON:  Portable exam 0612 hours compared to 08/07/2016 FINDINGS: Interval removal of LEFT thoracostomy tube and RIGHT jugular line. Minimal enlargement of cardiac silhouette and pulmonary vascular congestion post median sternotomy. Perihilar edema. Minimal bibasilar atelectasis. No pleural effusion or pneumothorax identified though portions of the LEFT apex are obscured by the patient's chin. Bowel interposition between liver and diaphragm. IMPRESSION: Mild pulmonary edema and minimal bibasilar atelectasis. Electronically Signed   By: Lavonia Dana M.D.   On: 08/08/2016 07:38   Dg Chest Port 1 View  Result Date: 08/07/2016 CLINICAL DATA:  CABG. EXAM: PORTABLE CHEST 1 VIEW COMPARISON:  08/06/2016 FINDINGS: Swan-Ganz catheter and mediastinal drain have been removed. Right jugular sheath and left chest tube remain in place. Postoperative changes from recent CABG are again identified. The cardiac silhouette remains mildly enlarged. Aortic atherosclerosis is noted. There are left greater than right perihilar and basilar lung opacities, improved on the right and not significantly changed on the left. No sizable pleural effusion is currently evident. No pneumothorax is identified. IMPRESSION: 1. Interval removal of Swan-Ganz catheter and mediastinal drain. Left chest tube remains without pneumothorax. 2. Persistent low lung volumes with mild edema and basilar atelectasis, improved on the right. Electronically Signed   By: Logan Bores M.D.   On: 08/07/2016 08:11    Assessment:  1 CKD 4/5 2 AKI, mild 3 Urinary retention 4 s/p CABG 6 Mild right sided RLE>RUE weakness on exam  Plan: 1 We will  follow 2 renal ultrasound  Eliot Bencivenga C 08/08/2016, 5:45 PM

## 2016-08-08 NOTE — Progress Notes (Signed)
Patient ID: [name], male   DOB: 1946/01/04, 71 y.o.   MRN: [id number] EVENING ROUNDS NOTE :     [address]       [place] [address]             [phone]                 3 Days Post-Op Procedure(s) (LRB): CORONARY ARTERY BYPASS GRAFTING (CABG) x 3, LIMA to LAD, SVG to DIAGONAL, SVG to OM1, SVG to DISTAL RCA, USING LEFT MAMMARY ARTERY AND RIGHT GREATER SAPHENOUS VEIN HARVESTED ENDOSCOPICALLY (N/A) TRANSESOPHAGEAL ECHOCARDIOGRAM (TEE) (N/A)  Total Length of Stay:  LOS: 13 days  BP 139/64   Pulse 74   Temp 98.3 F (36.8 C) (Oral)   Resp (!) 24   Ht 6\' 1"  (1.854 m)   Wt 211 lb 6.7 oz (95.9 kg)   SpO2 93%   BMI 27.89 kg/m   .Intake/Output      06/25 0701 - 06/26 0700   P.O. 540   I.V. (mL/kg)    Total Intake(mL/kg) 540 (5.6)   Urine (mL/kg/hr) 625 (0.5)   Chest Tube    Total Output 625   Net -85         . lactated ringers Stopped (08/06/16 1000)     Lab Results  Component Value Date   WBC 11.9 (H) 08/08/2016   HGB 8.2 (L) 08/08/2016   HCT 25.2 (L) 08/08/2016   PLT 147 (L) 08/08/2016   GLUCOSE 141 (H) 08/08/2016   CHOL 99 07/26/2016   TRIG 80 07/26/2016   HDL 38 (L) 07/26/2016   LDLCALC 45 07/26/2016   ALT 37 08/04/2016   AST 31 08/04/2016   NA 136 08/08/2016   K 4.0 08/08/2016   CL 105 08/08/2016   CREATININE 4.13 (H) 08/08/2016   BUN 52 (H) 08/08/2016   CO2 22 08/08/2016   TSH 0.565 07/25/2016   INR 1.27 08/05/2016   HGBA1C 7.1 (H) 08/05/2016   Seen by nephrology uop ok cr up to 4.13 Question of mild right leg weakness, patient says this was present previously   [name] MD  [facility]eeper [phone] Office [phone] 08/08/2016 8:02 PM

## 2016-08-08 NOTE — Evaluation (Signed)
Physical Therapy Re-Evaluation Patient Details Name: [name] MRN: [id number] DOB: [birth year] Today's Date: 08/08/2016   History of Present Illness  71 year old male w/ acute on chronic heart failure, c/b CRI stage IV admitted on 07/25/16 for SOB and chest pain.  Dx with NSTEMI and severe multi vessel CAD per L heart cath 07/28/16.  s/p CABG x 3 08/05/16.  PMH positive for HTN, DM, CKD, and lumbar fusion.    Clinical Impression  Patient presents with decreased independence following CABG as noted above.  Currently needing mod A of 2 for sit to stand and short distance ambulation in the room.  Hope to progress to return home with family support and HHPT.  Will continue to follow acutely.     Follow Up Recommendations Home health PT    Equipment Recommendations  Rolling walker with 5" wheels;3in1 (PT)    Recommendations for Other Services       Precautions / Restrictions Precautions Precautions: Fall;Sternal Restrictions Weight Bearing Restrictions: Yes (sternal precautions)      Mobility  Bed Mobility               General bed mobility comments: pt up in chair  Transfers Overall transfer level: Needs assistance Equipment used: Rolling walker (2 wheeled) Transfers: Sit to/from Stand Sit to Stand: +2 physical assistance;Mod assist         General transfer comment: lifting help from chair with pad underneath and cues for hip, knee and trunk extension  Ambulation/Gait Ambulation/Gait assistance: Mod assist;+2 safety/equipment (+ another for chair to follow) Ambulation Distance (Feet): 13 Feet Assistive device: Rolling walker (2 wheeled) Gait Pattern/deviations: Step-to pattern;Shuffle;Trunk flexed;Decreased stride length     General Gait Details: cues and facilitation for trunk, head, neck extension, remained flexed throughout, fatigued at doorway and RN following with chair  Stairs            Wheelchair Mobility    Modified Rankin (Stroke Patients  Only)       Balance                                             Pertinent Vitals/Pain Pain Assessment: No/denies pain    Home Living Family/patient expects to be discharged to:: Private residence Living Arrangements: Spouse/significant other;Children Available Help at Discharge: Family;Available 24 hours/day Type of Home: Apartment Home Access: Level entry (3 steps in back no rails)     Home Layout: One level Home Equipment: None Additional Comments: Enjoys going to church    Prior Function Level of Independence: Independent               Hand Dominance        Extremity/Trunk Assessment   Upper Extremity Assessment Upper Extremity Assessment: LUE deficits/detail;RUE deficits/detail RUE Deficits / Details: AROM WFL up to shoulder level due to sternal precautions LUE Deficits / Details: AROM WFL up to shoulder level due to sternal precautions    Lower Extremity Assessment Lower Extremity Assessment: Generalized weakness    Cervical / Trunk Assessment Cervical / Trunk Assessment: Kyphotic  Communication   Communication: No difficulties  Cognition Arousal/Alertness: Awake/alert Behavior During Therapy: WFL for tasks assessed/performed Overall Cognitive Status: Within Functional Limits for tasks assessed  General Comments      Exercises     Assessment/Plan    PT Assessment Patient needs continued PT services  PT Problem List Decreased balance;Decreased activity tolerance;Decreased strength;Cardiopulmonary status limiting activity;Decreased knowledge of use of DME;Decreased mobility;Decreased safety awareness       PT Treatment Interventions DME instruction;Therapeutic activities;Therapeutic exercise;Patient/family education;Gait training;Balance training;Functional mobility training    PT Goals (Current goals can be found in the Care Plan section)  Acute Rehab PT  Goals Patient Stated Goal: to get stronger and return home.  Time For Goal Achievement: 08/15/16 Potential to Achieve Goals: Good    Frequency Min 3X/week   Barriers to discharge        Co-evaluation               AM-PAC PT "6 Clicks" Daily Activity  Outcome Measure Difficulty turning over in bed (including adjusting bedclothes, sheets and blankets)?: Total Difficulty moving from lying on back to sitting on the side of the bed? : Total Difficulty sitting down on and standing up from a chair with arms (e.g., wheelchair, bedside commode, etc,.)?: Total Help needed moving to and from a bed to chair (including a wheelchair)?: A Lot Help needed walking in hospital room?: A Lot Help needed climbing 3-5 steps with a railing? : Total 6 Click Score: 8    End of Session Equipment Utilized During Treatment: Gait belt;Oxygen Activity Tolerance: Patient limited by fatigue Patient left: in chair;with call bell/phone within reach   PT Visit Diagnosis: Difficulty in walking, not elsewhere classified (R26.2);Muscle weakness (generalized) (M62.81)    Time: 1017-5102 PT Time Calculation (min) (ACUTE ONLY): 27 min   Charges:   PT Evaluation $PT Re-evaluation: 1 Procedure PT Treatments $Gait Training: 8-22 mins   PT G CodesMagda Kiel, [place] [phone] 08/08/2016   [name] 08/08/2016, 11:05 AM

## 2016-08-08 NOTE — Progress Notes (Signed)
Patient ID: [name], male   DOB: [birth year], 71 y.o.   MRN: [id number] TCTS DAILY ICU PROGRESS NOTE                   [address]            [place] [address]          [phone]   3 Days Post-Op Procedure(s) (LRB): CORONARY ARTERY BYPASS GRAFTING (CABG) x 3, LIMA to LAD, SVG to DIAGONAL, SVG to OM1, SVG to DISTAL RCA, USING LEFT MAMMARY ARTERY AND RIGHT GREATER SAPHENOUS VEIN HARVESTED ENDOSCOPICALLY (N/A) TRANSESOPHAGEAL ECHOCARDIOGRAM (TEE) (N/A)  Total Length of Stay:  LOS: 13 days   Subjective: Up to chair, alert and neuro intact, very slow walking   Objective: Vital signs in last 24 hours: Temp:  [98.3 F (36.8 C)-99.2 F (37.3 C)] 98.9 F (37.2 C) (06/25 0300) Pulse Rate:  [73-87] 86 (06/25 0600) Cardiac Rhythm: Normal sinus rhythm;Bundle branch block;Heart block (06/24 2000) Resp:  [8-33] 23 (06/25 0600) BP: (124-159)/(60-79) 156/70 (06/25 0400) SpO2:  [80 %-100 %] 96 % (06/25 0600) Weight:  [211 lb 6.7 oz (95.9 kg)] 211 lb 6.7 oz (95.9 kg) (06/25 0600)  Filed Weights   08/06/16 0500 08/07/16 0500 08/08/16 0600  Weight: 213 lb 10 oz (96.9 kg) 213 lb 3 oz (96.7 kg) 211 lb 6.7 oz (95.9 kg)    Weight change: -1 lb 12.2 oz (-0.8 kg)   Hemodynamic parameters for last 24 hours:    Intake/Output from previous day: 06/24 0701 - 06/25 0700 In: 260 [P.O.:180; I.V.:80] Out: 970 [Urine:960; Chest Tube:10]  Intake/Output this shift: No intake/output data recorded.  Current Meds: Scheduled Meds: . acetaminophen  1,000 mg Oral Q6H   Or  . acetaminophen (TYLENOL) oral liquid 160 mg/5 mL  1,000 mg Per Tube Q6H  . allopurinol  100 mg Oral Daily  . amLODipine  5 mg Oral Daily  . aspirin EC  325 mg Oral Daily   Or  . aspirin  324 mg Per Tube Daily  . bisacodyl  10 mg Oral Daily   Or  . bisacodyl  10 mg Rectal Daily  . carvedilol  25 mg Oral BID WC  . docusate sodium  200 mg Oral Daily  . doxazosin  4 mg Oral BID  . furosemide  40 mg  Intravenous BID  . insulin aspart  0-24 Units Subcutaneous Q4H  . insulin aspart  3 Units Subcutaneous TID WC  . levothyroxine  100 mcg Oral QAC breakfast  . lubiprostone  24 mcg Oral BID  . nitroGLYCERIN  0.6 mg Transdermal Daily  . pantoprazole  40 mg Oral Daily  . rosuvastatin  10 mg Oral QPM  . sodium chloride flush  3 mL Intravenous Q12H  . tamsulosin  0.4 mg Oral Daily   Continuous Infusions: . lactated ringers Stopped (08/06/16 1000)  . lactated ringers 20 mL/hr at 08/05/16 1415   PRN Meds:.ondansetron (ZOFRAN) IV, oxyCODONE, sodium chloride flush  General appearance: alert, cooperative and no distress Neurologic: intact Heart: regular rate and rhythm, S1, S2 normal, no murmur, click, rub or gallop Lungs: diminished breath sounds bibasilar Abdomen: soft, non-tender; bowel sounds normal; no masses,  no organomegaly Extremities: extremities normal, atraumatic, no cyanosis or edema and Homans sign is negative, no sign of DVT Wound: sternum stable   Lab Results: CBC: Recent Labs  08/07/16 0358 08/08/16 0305  WBC 12.7* 11.9*  HGB 8.7* 8.2*  HCT 27.2* 25.2*  PLT 158 147*   BMET:  Recent Labs  08/07/16 0358 08/08/16 0305  NA 136 136  K 4.3 4.0  CL 105 105  CO2 21* 22  GLUCOSE 107* 141*  BUN 43* 52*  CREATININE 3.56* 4.13*  CALCIUM 8.7* 8.6*    CMET: Lab Results  Component Value Date   WBC 11.9 (H) 08/08/2016   HGB 8.2 (L) 08/08/2016   HCT 25.2 (L) 08/08/2016   PLT 147 (L) 08/08/2016   GLUCOSE 141 (H) 08/08/2016   CHOL 99 07/26/2016   TRIG 80 07/26/2016   HDL 38 (L) 07/26/2016   LDLCALC 45 07/26/2016   ALT 37 08/04/2016   AST 31 08/04/2016   NA 136 08/08/2016   K 4.0 08/08/2016   CL 105 08/08/2016   CREATININE 4.13 (H) 08/08/2016   BUN 52 (H) 08/08/2016   CO2 22 08/08/2016   TSH 0.565 07/25/2016   INR 1.27 08/05/2016   HGBA1C 7.1 (H) 08/05/2016      PT/INR:  Recent Labs  08/05/16 1423  LABPROT 16.0*  INR 1.27   Radiology: Dg Chest  Port 1 View  Result Date: 08/08/2016 CLINICAL DATA:  Chest soreness post CABG EXAM: PORTABLE CHEST 1 VIEW COMPARISON:  Portable exam 0612 hours compared to 08/07/2016 FINDINGS: Interval removal of LEFT thoracostomy tube and RIGHT jugular line. Minimal enlargement of cardiac silhouette and pulmonary vascular congestion post median sternotomy. Perihilar edema. Minimal bibasilar atelectasis. No pleural effusion or pneumothorax identified though portions of the LEFT apex are obscured by the patient's chin. Bowel interposition between liver and diaphragm. IMPRESSION: Mild pulmonary edema and minimal bibasilar atelectasis. Electronically Signed   By: Lavonia Dana M.D.   On: 08/08/2016 07:38     Assessment/Plan: S/P Procedure(s) (LRB): CORONARY ARTERY BYPASS GRAFTING (CABG) x 3, LIMA to LAD, SVG to DIAGONAL, SVG to OM1, SVG to DISTAL RCA, USING LEFT MAMMARY ARTERY AND RIGHT GREATER SAPHENOUS VEIN HARVESTED ENDOSCOPICALLY (N/A) TRANSESOPHAGEAL ECHOCARDIOGRAM (TEE) (N/A) Mobilize Diuresis Needs PT, ordered Have renal follow up, with cr again rising as expected now at admission level  , currently uop 500 ml per shift    [name] 08/08/2016 7:40 AM

## 2016-08-08 NOTE — Op Note (Signed)
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  Chronic renal insufficiency stage 4.  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  Chronic renal insufficiency stage 4.  SURGICAL PROCEDURE:  The patient is a 71 year old male, who was admitted the week prior to surgery with chest discomfort and markedly elevated creatinine to almost 4.  The patient has known anemia chronically and known renal insufficiency.  Troponins were elevated leading to cardiac catheterization by Dr. Irish Lack.  After catheterization, we waited for his renal function to stabilize and ultimately returned close to baseline creatinine of 3.0.  Cardiac catheterization revealed distal left main obstruction of 80% diagonal disease, proximal circumflex obstruction of 70-80%.  The right coronary artery was totally occluded with collateral filling.  The patient had significant left ventricular hypertrophy presumably from longstanding hypertension.  Risks and options of surgery were discussed with the patient in detail especially in light of his renal insufficiency; however, with his critical anatomy, coronary artery bypass grafting was felt to be the best option for treatment and was recommended to the patient.  He agreed and signed informed consent.  DESCRIPTION OF PROCEDURE:  With Swan-Ganz and arterial line monitors in place, the patient underwent general endotracheal anesthesia without incident.  The skin and chest and legs were prepped with Betadine  and draped in usual sterile manner.  Appropriate time-out was performed.  We then made small incision just below the right knee to evaluate the greater saphenous vein.  The initial vein found appeared small.  A small incision made in the left just above the left knee to evaluate the size of the vein on the left.  Ultimately, we went back to the right side to look further and found the main greater saphenous vein and endoscopically harvested the right greater saphenous vein from the thigh and calf of the right leg.  The vein was of adequate quality and caliber.  Median sternotomy was performed.  Left internal mammary artery was dissected down as a pedicle graft.  The distal artery was divided and had excellent free flow.  The vessel was very large.  Pericardium was opened.  The patient was systemically heparinized.  The ascending aorta was cannulated.  The right atrium was cannulated and aortic root vent cardioplegia needle was introduced into the ascending aorta.  It should be noted the patient had significant calcification of his distal ascending aorta; however, we were able to palpate an area to cannula, which was soft and purposely placed the grafts slightly more proximal than usual to avoid the calcific plaque.  The patient was placed on cardiopulmonary bypass 2.4 L/min/m2.  Sites of anastomosis were selected and dissected out of  the epicardium.  The patient's body temperature was cooled to 32 degrees.  Aortic crossclamp was applied and 700 mL of cold blood potassium cardioplegia was administered with diastolic arrest of the heart.  Myocardial septal temperature was monitored throughout the crossclamp.  Attention was turned first to the very distal right coronary artery and proximal posterior descending.  This vessel was very diffusely diseased and right at the takeoff of the posterior descending artery.  The vessel was opened, had a poor quality lumen, but did admit a 1 mm probe  distally.  Using a running 7-0 Prolene, distal anastomosis was performed.  Additional cold blood cardioplegia was administered down the vein grafts.  The heart was then elevated and the large first obtuse marginal vessel was located, was partially intramyocardial, vessel was opened, and admitted to 1.5 mm probe distally.  Using a running 7-0 Prolene, distal anastomosis was performed.  Attention was then turned to the diagonal coronary artery, which was relatively small, but was opened and admitted a 1 mm probe.  Using a running 7-0 Prolene, distal anastomosis was performed in the distal third of the left anterior descending coronary artery.  The vessel was opened and admitted a 1.5 mm probe distally.  Using a running 8-0 Prolene, the left internal mammary artery was anastomosed to the left anterior descending coronary artery with the crossclamp still in place.  Three punch aortotomies were performed and each of the 3 vein grafts were anastomosed to the ascending aorta.  The heart was allowed to passively fill and de-air, the proximal anastomoses were completed.  Bulldog on the mammary artery was removed with prompt rise in myocardial septal temperature.  The aortic cross-clamp was removed with a total crossclamp time of 100 minutes.  The patient required electrical defibrillation returning to a sinus rhythm.  Sites of anastomosis were inspected and free of bleeding. The patient was then ventilated and weaned from cardiopulmonary bypass without difficulty.  He remained hemodynamically stable.  He was decannulated in usual fashion.  Protamine sulfate was administered with operative field hemostatic.  Graft markers were applied.  Atrial and ventricular pacing wires were applied.  A left pleural tube and a Blake mediastinal drain were left in place.  Pericardium was loosely reapproximated.  Sternum was closed with #6 stainless steel wire. Fascia closed with interrupted 0 Vicryl, running 3-0  Vicryl in subcutaneous tissue, 4-0 subcuticular stitch in skin edges.  Dry dressings were applied.  Sponge and needle count were reported as correct at completion of procedure.  The patient tolerated the procedure without obvious complication and was transferred to surgical intensive care unit for further postoperative care.  Total pump time was 132 minutes.  The patient because of low hematocrit prior to surgery of 26 did require 2 units of packed red blood cells while on cardiopulmonary bypass.     Lanelle Bal, MD     EG/MEDQ  D:  08/08/2016  T:  08/08/2016  Job:  [id number]

## 2016-08-09 ENCOUNTER — Inpatient Hospital Stay (HOSPITAL_COMMUNITY): Payer: Medicare Other

## 2016-08-09 LAB — BASIC METABOLIC PANEL
Anion gap: 12 (ref 5–15)
BUN: 57 mg/dL — ABNORMAL HIGH (ref 6–20)
CO2: 17 mmol/L — ABNORMAL LOW (ref 22–32)
Calcium: 8.6 mg/dL — ABNORMAL LOW (ref 8.9–10.3)
Chloride: 105 mmol/L (ref 101–111)
Creatinine, Ser: 4.33 mg/dL — ABNORMAL HIGH (ref 0.61–1.24)
GFR calc Af Amer: 15 mL/min — ABNORMAL LOW (ref >60)
GFR calc non Af Amer: 13 mL/min — ABNORMAL LOW (ref >60)
Glucose, Bld: 155 mg/dL — ABNORMAL HIGH (ref 65–99)
Potassium: 3.8 mmol/L (ref 3.5–5.1)
Sodium: 134 mmol/L — ABNORMAL LOW (ref 135–145)

## 2016-08-09 LAB — CBC
HCT: 26.3 % — ABNORMAL LOW (ref 39.0–52.0)
Hemoglobin: 8.4 g/dL — ABNORMAL LOW (ref 13.0–17.0)
MCH: 29 pg (ref 26.0–34.0)
MCHC: 31.9 g/dL (ref 30.0–36.0)
MCV: 90.7 fL (ref 78.0–100.0)
Platelets: 167 10*3/uL (ref 150–400)
RBC: 2.9 MIL/uL — ABNORMAL LOW (ref 4.22–5.81)
RDW: 15.4 % (ref 11.5–15.5)
WBC: 9.8 10*3/uL (ref 4.0–10.5)

## 2016-08-09 LAB — GLUCOSE, CAPILLARY
GLUCOSE-CAPILLARY: 138 mg/dL — AB (ref 65–99)
GLUCOSE-CAPILLARY: 143 mg/dL — AB (ref 65–99)
Glucose-Capillary: 128 mg/dL — ABNORMAL HIGH (ref 65–99)
Glucose-Capillary: 142 mg/dL — ABNORMAL HIGH (ref 65–99)
Glucose-Capillary: 148 mg/dL — ABNORMAL HIGH (ref 65–99)
Glucose-Capillary: 149 mg/dL — ABNORMAL HIGH (ref 65–99)
Glucose-Capillary: 152 mg/dL — ABNORMAL HIGH (ref 65–99)

## 2016-08-09 MED ORDER — AMLODIPINE BESYLATE 10 MG PO TABS
10.0000 mg | ORAL_TABLET | Freq: Every day | ORAL | Status: DC
  Administered 2016-08-09 – 2016-08-17 (×9): 10 mg via ORAL
  Filled 2016-08-09 (×9): qty 1

## 2016-08-09 NOTE — Progress Notes (Signed)
Assessment:  1 CKD 4/5 2 AKI 3 Urinary retention 4 s/p CABG 6 Mild right sided RLE>RUE weakness on exam, improved  Plan: 1 We will follow 2 renal ultrasound  Subjective: Interval History: Ok  Objective: Vital signs in last 24 hours: Temp:  [98.3 F (36.8 C)-98.9 F (37.2 C)] 98.3 F (36.8 C) (06/26 1100) Pulse Rate:  [66-78] 71 (06/26 1000) Resp:  [13-28] 18 (06/26 1000) BP: (125-163)/(55-103) 163/70 (06/26 1000) SpO2:  [93 %-99 %] 98 % (06/26 1000) Weight:  [93.3 kg (205 lb 9.6 oz)] 93.3 kg (205 lb 9.6 oz) (06/26 0500) Weight change: -2.641 kg (-5 lb 13.1 oz)  Intake/Output from previous day: 06/25 0701 - 06/26 0700 In: 540 [P.O.:540] Out: 1125 [Urine:1125] Intake/Output this shift: Total I/O In: 600 [P.O.:600] Out: 200 [Urine:200]  General appearance: alert and cooperative Resp: clear to auscultation bilaterally Chest wall: sternotomy Cardio: regular rate and rhythm, S1, S2 normal, no murmur, click, rub or gallop Extremities: edema 1+  Lab Results:  Recent Labs  08/08/16 0305 08/09/16 0528  WBC 11.9* 9.8  HGB 8.2* 8.4*  HCT 25.2* 26.3*  PLT 147* 167   BMET:  Recent Labs  08/08/16 0305 08/09/16 0528  NA 136 134*  K 4.0 3.8  CL 105 105  CO2 22 17*  GLUCOSE 141* 155*  BUN 52* 57*  CREATININE 4.13* 4.33*  CALCIUM 8.6* 8.6*   No results for input(s): PTH in the last 72 hours. Iron Studies: No results for input(s): IRON, TIBC, TRANSFERRIN, FERRITIN in the last 72 hours. Studies/Results: Dg Chest Port 1 View  Result Date: 08/09/2016 CLINICAL DATA:  71 year old male post CABG. Sore chest. Subsequent encounter. EXAM: PORTABLE CHEST 1 VIEW COMPARISON:  08/08/2016. FINDINGS: Post CABG.  Heart size top-normal. Calcified slightly tortuous aorta. Bibasilar and perihilar subsegmental atelectasis. Elevated right hemidiaphragm. Gas filled colon extends below diaphragm limiting evaluation for detection of possibility of free intraperitoneal air. Central  pulmonary vascular prominence. No pneumothorax. IMPRESSION: Post CABG with top-normal heart size. Subsegmental atelectasis perihilar and lower lobe region bilaterally. Central pulmonary vascular prominence. Elevated right hemidiaphragm. Gas filled colon extends below diaphragm limiting evaluation for detection of possibility of free intraperitoneal air. Electronically Signed   By: Genia Del M.D.   On: 08/09/2016 07:22   Dg Chest Port 1 View  Result Date: 08/08/2016 CLINICAL DATA:  Chest soreness post CABG EXAM: PORTABLE CHEST 1 VIEW COMPARISON:  Portable exam 0612 hours compared to 08/07/2016 FINDINGS: Interval removal of LEFT thoracostomy tube and RIGHT jugular line. Minimal enlargement of cardiac silhouette and pulmonary vascular congestion post median sternotomy. Perihilar edema. Minimal bibasilar atelectasis. No pleural effusion or pneumothorax identified though portions of the LEFT apex are obscured by the patient's chin. Bowel interposition between liver and diaphragm. IMPRESSION: Mild pulmonary edema and minimal bibasilar atelectasis. Electronically Signed   By: Lavonia Dana M.D.   On: 08/08/2016 07:38    Scheduled: . acetaminophen  1,000 mg Oral Q6H   Or  . acetaminophen (TYLENOL) oral liquid 160 mg/5 mL  1,000 mg Per Tube Q6H  . allopurinol  100 mg Oral Daily  . amLODipine  10 mg Oral Daily  . aspirin EC  325 mg Oral Daily   Or  . aspirin  324 mg Per Tube Daily  . bisacodyl  10 mg Oral Daily   Or  . bisacodyl  10 mg Rectal Daily  . carvedilol  25 mg Oral BID WC  . docusate sodium  200 mg Oral Daily  . doxazosin  4 mg Oral BID  . furosemide  40 mg Intravenous BID  . insulin aspart  0-24 Units Subcutaneous Q4H  . insulin aspart  3 Units Subcutaneous TID WC  . levothyroxine  100 mcg Oral QAC breakfast  . lubiprostone  24 mcg Oral BID  . nitroGLYCERIN  0.6 mg Transdermal Daily  . pantoprazole  40 mg Oral Daily  . rosuvastatin  10 mg Oral QPM  . sodium chloride flush  3 mL  Intravenous Q12H  . tamsulosin  0.4 mg Oral Daily     LOS: 14 days   Keiarah Orlowski C 08/09/2016,1:58 PM

## 2016-08-09 NOTE — Progress Notes (Signed)
Physical Therapy Treatment Patient Details Name: [name] MRN: [id number] DOB: February 11, 1946 Today's Date: 08/09/2016    History of Present Illness 71 year old male w/ acute on chronic heart failure, c/b CRI stage IV admitted on 07/25/16 for SOB and chest pain.  Dx with NSTEMI and severe multi vessel CAD per L heart cath 07/28/16.  s/p CABG x 3 08/05/16.  PMH positive for HTN, DM, CKD, and lumbar fusion.      PT Comments    Patient progressing some with ambulation distance and off O2.  However, noted increased R weakness per pt in UE and LE and has some difficulty with stance control on R with ambulation despite cues and assist.  Feel he could benefit from continued skilled PT in acute and follow up CIR level rehab prior to d/c home with wife assist.   Follow Up Recommendations  CIR;Supervision/Assistance - 24 hour     Equipment Recommendations  Rolling walker with 5" wheels    Recommendations for Other Services Rehab consult;OT consult     Precautions / Restrictions Precautions Precautions: Fall;Sternal Restrictions Weight Bearing Restrictions: Yes (sternal precautions)    Mobility  Bed Mobility Overal bed mobility: Needs Assistance Bed Mobility: Sit to Sidelying         Sit to sidelying: Mod assist General bed mobility comments: assist for LE's cues for technique with holding pillow to chest  Transfers Overall transfer level: Needs assistance Equipment used: Rolling walker (2 wheeled) Transfers: Sit to/from Stand Sit to Stand: Max assist         General transfer comment: lifting assist from recliner, assist to sit slowly into chair, onto bed  Ambulation/Gait Ambulation/Gait assistance: Mod assist;+2 safety/equipment (chair to follow) Ambulation Distance (Feet): 50 Feet Assistive device: Rolling walker (2 wheeled) Gait Pattern/deviations: Decreased stance time - right;Trunk flexed;Decreased dorsiflexion - right;Decreased step length - right     General Gait  Details: noted R side weakness and some buckling in stance on R, cues for tall stance on R during swing on L, assist for balance, walker, safety due to weakness   Stairs            Wheelchair Mobility    Modified Rankin (Stroke Patients Only)       Balance Overall balance assessment: Needs assistance Sitting-balance support: Feet supported Sitting balance-Leahy Scale: Good     Standing balance support: Bilateral upper extremity supported Standing balance-Leahy Scale: Poor Standing balance comment: UE support for balance                            Cognition Arousal/Alertness: Awake/alert Behavior During Therapy: WFL for tasks assessed/performed Overall Cognitive Status: Within Functional Limits for tasks assessed                                        Exercises      General Comments        Pertinent Vitals/Pain Pain Assessment: No/denies pain    Home Living                      Prior Function            PT Goals (current goals can now be found in the care plan section) Progress towards PT goals: Progressing toward goals    Frequency    Min 3X/week  PT Plan Current plan remains appropriate    Co-evaluation              AM-PAC PT "6 Clicks" Daily Activity  Outcome Measure  Difficulty turning over in bed (including adjusting bedclothes, sheets and blankets)?: Total Difficulty moving from lying on back to sitting on the side of the bed? : Total Difficulty sitting down on and standing up from a chair with arms (e.g., wheelchair, bedside commode, etc,.)?: Total Help needed moving to and from a bed to chair (including a wheelchair)?: A Lot Help needed walking in hospital room?: A Lot Help needed climbing 3-5 steps with a railing? : Total 6 Click Score: 8    End of Session Equipment Utilized During Treatment: Gait belt Activity Tolerance: Patient limited by fatigue Patient left: in bed;with call  bell/phone within reach   PT Visit Diagnosis: Difficulty in walking, not elsewhere classified (R26.2);Muscle weakness (generalized) (M62.81)     Time: 3128-1188 PT Time Calculation (min) (ACUTE ONLY): 24 min  Charges:  $Gait Training: 8-22 mins $Therapeutic Activity: 8-22 mins                    G CodesMagda Kiel, [place] [phone] 08/09/2016    [name] 08/09/2016, 11:38 AM

## 2016-08-09 NOTE — Anesthesia Postprocedure Evaluation (Signed)
Anesthesia Post Note  Patient: MICHIAH MUDRY  Procedure(s) Performed: Procedure(s) (LRB): CORONARY ARTERY BYPASS GRAFTING (CABG) x 3, LIMA to LAD, SVG to DIAGONAL, SVG to OM1, SVG to DISTAL RCA, USING LEFT MAMMARY ARTERY AND RIGHT GREATER SAPHENOUS VEIN HARVESTED ENDOSCOPICALLY (N/A) TRANSESOPHAGEAL ECHOCARDIOGRAM (TEE) (N/A)     Patient location during evaluation: SICU Anesthesia Type: General Level of consciousness: sedated Pain management: pain level controlled Vital Signs Assessment: post-procedure vital signs reviewed and stable Respiratory status: patient remains intubated per anesthesia plan Cardiovascular status: stable Anesthetic complications: no    Last Vitals:  Vitals:   08/09/16 1800 08/09/16 1900  BP: (!) 146/77 115/67  Pulse: 80 70  Resp: 19 14  Temp:      Last Pain:  Vitals:   08/09/16 1100  TempSrc: Oral  PainSc:                  Caroll Weinheimer

## 2016-08-09 NOTE — Progress Notes (Signed)
Rehab Admissions Coordinator Note:  Patient was screened by Cleatrice Burke for appropriateness for an Inpatient Acute Rehab Consult per PT recommendation. I would like to see how he progresses over the next few days. Doubt [facility] would approve an inpt rehab admission for this diagnosis. Recommend OT eval. .  Cleatrice Burke 08/09/2016, 12:23 PM  I can be reached at [phone].

## 2016-08-09 NOTE — Care Management Note (Signed)
Case Management Note Marvetta Gibbons RN, BSN Unit 2W-Case Manager-- [place] coverage [phone]  Patient Details  Name: [name] MRN: [id number] Date of Birth: 1945-08-19  Subjective/Objective:  Pt admitted with acute on Chronic HF and NSTEMI from outside hospital - aggressive diuresis                 Action/Plan: PTA pt lived at home with spouse- plan for cardiac cath on 6/14- CM to follow for  d/c needs-- per PT eval recommendations for HHPT and DME-RW and 3n1- will need orders prior to discharge.   Expected Discharge Date:                  Expected Discharge Plan:  [place]  In-House Referral:     Discharge planning Services  CM Consult  Post Acute Care Choice:  Home Health, Durable Medical Equipment Choice offered to:     DME Arranged:    DME Agency:     HH Arranged:    HH Agency:     Status of Service:  In process, will continue to follow  If discussed at Long Length of Stay Meetings, dates discussed:    Discharge Disposition:   Additional Comments:  08/09/16- 1130- Marvetta Gibbons RN, CM- pt s/p CABGx3 on 08/05/16- CM to continue to follow for d/c needs- anticipate home with [facility].   Dawayne Patricia, RN 08/09/2016, 11:31 AM

## 2016-08-09 NOTE — Progress Notes (Signed)
Patient ID: [name], male   DOB: 08-01-1945, 71 y.o.   MRN: [id number]  SICU Evening Rounds:  Hemodynamically stable in sinus rhythm  sats 94% on room air  Urine output ok. Nephrology following.

## 2016-08-09 NOTE — Progress Notes (Addendum)
[facility] DAILY ICU PROGRESS NOTE                   [address]            [place] [address]          [phone]   4 Days Post-Op Procedure(s) (LRB): CORONARY ARTERY BYPASS GRAFTING (CABG) x 3, LIMA to LAD, SVG to DIAGONAL, SVG to OM1, SVG to DISTAL RCA, USING LEFT MAMMARY ARTERY AND RIGHT GREATER SAPHENOUS VEIN HARVESTED ENDOSCOPICALLY (N/A) TRANSESOPHAGEAL ECHOCARDIOGRAM (TEE) (N/A)  Total Length of Stay:  LOS: 14 days   Subjective: Mild pain, som SOB- mild  Objective: Vital signs in last 24 hours: Temp:  [98.3 F (36.8 C)-98.9 F (37.2 C)] 98.9 F (37.2 C) (06/26 0700) Pulse Rate:  [72-84] 73 (06/26 0600) Cardiac Rhythm: Normal sinus rhythm;Bundle branch block;Heart block (06/25 2000) Resp:  [13-28] 19 (06/26 0600) BP: (126-166)/(55-75) 147/65 (06/26 0600) SpO2:  [93 %-99 %] 96 % (06/26 0600) Weight:  [205 lb 9.6 oz (93.3 kg)] 205 lb 9.6 oz (93.3 kg) (06/26 0500)  Filed Weights   08/07/16 0500 08/08/16 0600 08/09/16 0500  Weight: 213 lb 3 oz (96.7 kg) 211 lb 6.7 oz (95.9 kg) 205 lb 9.6 oz (93.3 kg)    Weight change: -5 lb 13.1 oz (-2.641 kg)   Hemodynamic parameters for last 24 hours:    Intake/Output from previous day: 06/25 0701 - 06/26 0700 In: 540 [P.O.:540] Out: 1125 [Urine:1125]  Intake/Output this shift: No intake/output data recorded.  Current Meds: Scheduled Meds: . acetaminophen  1,000 mg Oral Q6H   Or  . acetaminophen (TYLENOL) oral liquid 160 mg/5 mL  1,000 mg Per Tube Q6H  . allopurinol  100 mg Oral Daily  . amLODipine  5 mg Oral Daily  . aspirin EC  325 mg Oral Daily   Or  . aspirin  324 mg Per Tube Daily  . bisacodyl  10 mg Oral Daily   Or  . bisacodyl  10 mg Rectal Daily  . carvedilol  25 mg Oral BID WC  . docusate sodium  200 mg Oral Daily  . doxazosin  4 mg Oral BID  . furosemide  40 mg Intravenous BID  . insulin aspart  0-24 Units Subcutaneous Q4H  . insulin aspart  3 Units Subcutaneous TID WC  . levothyroxine   100 mcg Oral QAC breakfast  . lubiprostone  24 mcg Oral BID  . nitroGLYCERIN  0.6 mg Transdermal Daily  . pantoprazole  40 mg Oral Daily  . rosuvastatin  10 mg Oral QPM  . sodium chloride flush  3 mL Intravenous Q12H  . tamsulosin  0.4 mg Oral Daily   Continuous Infusions: . lactated ringers Stopped (08/06/16 1000)   PRN Meds:.ondansetron (ZOFRAN) IV, oxyCODONE, sodium chloride flush  General appearance: alert, cooperative, fatigued and no distress Heart: regular rate and rhythm Lungs: mildly dim in the right base Abdomen: mild distension, soft, non-tender Extremities: + R>L LE edema Wound: incis healing well  Lab Results: CBC: Recent Labs  08/08/16 0305 08/09/16 0528  WBC 11.9* 9.8  HGB 8.2* 8.4*  HCT 25.2* 26.3*  PLT 147* 167   BMET:  Recent Labs  08/08/16 0305 08/09/16 0528  NA 136 134*  K 4.0 3.8  CL 105 105  CO2 22 17*  GLUCOSE 141* 155*  BUN 52* 57*  CREATININE 4.13* 4.33*  CALCIUM 8.6* 8.6*    CMET: Lab Results  Component Value Date   WBC  9.8 08/09/2016   HGB 8.4 (L) 08/09/2016   HCT 26.3 (L) 08/09/2016   PLT 167 08/09/2016   GLUCOSE 155 (H) 08/09/2016   CHOL 99 07/26/2016   TRIG 80 07/26/2016   HDL 38 (L) 07/26/2016   LDLCALC 45 07/26/2016   ALT 37 08/04/2016   AST 31 08/04/2016   NA 134 (L) 08/09/2016   K 3.8 08/09/2016   CL 105 08/09/2016   CREATININE 4.33 (H) 08/09/2016   BUN 57 (H) 08/09/2016   CO2 17 (L) 08/09/2016   TSH 0.565 07/25/2016   INR 1.27 08/05/2016   HGBA1C 7.1 (H) 08/05/2016      PT/INR: No results for input(s): LABPROT, INR in the last 72 hours. Radiology: Dg Chest Port 1 View  Result Date: 08/09/2016 CLINICAL DATA:  71 year old male post CABG. Sore chest. Subsequent encounter. EXAM: PORTABLE CHEST 1 VIEW COMPARISON:  08/08/2016. FINDINGS: Post CABG.  Heart size top-normal. Calcified slightly tortuous aorta. Bibasilar and perihilar subsegmental atelectasis. Elevated right hemidiaphragm. Gas filled colon extends below  diaphragm limiting evaluation for detection of possibility of free intraperitoneal air. Central pulmonary vascular prominence. No pneumothorax. IMPRESSION: Post CABG with top-normal heart size. Subsegmental atelectasis perihilar and lower lobe region bilaterally. Central pulmonary vascular prominence. Elevated right hemidiaphragm. Gas filled colon extends below diaphragm limiting evaluation for detection of possibility of free intraperitoneal air. Electronically Signed   By: Genia Del M.D.   On: 08/09/2016 07:22     Assessment/Plan: S/P Procedure(s) (LRB): CORONARY ARTERY BYPASS GRAFTING (CABG) x 3, LIMA to LAD, SVG to DIAGONAL, SVG to OM1, SVG to DISTAL RCA, USING LEFT MAMMARY ARTERY AND RIGHT GREATER SAPHENOUS VEIN HARVESTED ENDOSCOPICALLY (N/A) TRANSESOPHAGEAL ECHOCARDIOGRAM (TEE) (N/A)   1 slow, steady progress 2 sinus rhythm 3 + HTN- increase norvasc dose 4 volume overload acute/chronic renal insuff- nephrology consultant assisting with management 5 will need cont PT to assist with rehab 6 sugars adeq controlled currently , will need transition to home regimine as appetite/intake improves 7 H/H pretty stable  GOLD,WAYNE E 08/09/2016 9:14 AM   Slow progress Will nee ip rehab or snf likely I have seen and examined Benay Pillow and agree with the above assessment  and plan.  [name] MD Beeper [phone] Office [phone] 08/09/2016 3:08 PM

## 2016-08-10 LAB — GLUCOSE, CAPILLARY
GLUCOSE-CAPILLARY: 109 mg/dL — AB (ref 65–99)
GLUCOSE-CAPILLARY: 165 mg/dL — AB (ref 65–99)
Glucose-Capillary: 119 mg/dL — ABNORMAL HIGH (ref 65–99)
Glucose-Capillary: 125 mg/dL — ABNORMAL HIGH (ref 65–99)
Glucose-Capillary: 130 mg/dL — ABNORMAL HIGH (ref 65–99)
Glucose-Capillary: 86 mg/dL (ref 65–99)

## 2016-08-10 LAB — CBC
HEMATOCRIT: 25.5 % — AB (ref 39.0–52.0)
Hemoglobin: 8.2 g/dL — ABNORMAL LOW (ref 13.0–17.0)
MCH: 28.9 pg (ref 26.0–34.0)
MCHC: 32.2 g/dL (ref 30.0–36.0)
MCV: 89.8 fL (ref 78.0–100.0)
PLATELETS: 194 10*3/uL (ref 150–400)
RBC: 2.84 MIL/uL — ABNORMAL LOW (ref 4.22–5.81)
RDW: 15.1 % (ref 11.5–15.5)
WBC: 7.2 10*3/uL (ref 4.0–10.5)

## 2016-08-10 LAB — BASIC METABOLIC PANEL
Anion gap: 11 (ref 5–15)
BUN: 64 mg/dL — AB (ref 6–20)
CHLORIDE: 106 mmol/L (ref 101–111)
CO2: 20 mmol/L — ABNORMAL LOW (ref 22–32)
CREATININE: 4.29 mg/dL — AB (ref 0.61–1.24)
Calcium: 8.4 mg/dL — ABNORMAL LOW (ref 8.9–10.3)
GFR calc Af Amer: 15 mL/min — ABNORMAL LOW (ref >60)
GFR calc non Af Amer: 13 mL/min — ABNORMAL LOW (ref >60)
Glucose, Bld: 122 mg/dL — ABNORMAL HIGH (ref 65–99)
Potassium: 3 mmol/L — ABNORMAL LOW (ref 3.5–5.1)
SODIUM: 137 mmol/L (ref 135–145)

## 2016-08-10 MED ORDER — FUROSEMIDE 80 MG PO TABS
80.0000 mg | ORAL_TABLET | Freq: Every day | ORAL | Status: DC
  Administered 2016-08-10 – 2016-08-12 (×3): 80 mg via ORAL
  Filled 2016-08-10 (×3): qty 1

## 2016-08-10 MED ORDER — PANTOPRAZOLE SODIUM 40 MG PO TBEC
40.0000 mg | DELAYED_RELEASE_TABLET | Freq: Every day | ORAL | Status: DC
  Administered 2016-08-10 – 2016-08-17 (×8): 40 mg via ORAL
  Filled 2016-08-10 (×8): qty 1

## 2016-08-10 MED ORDER — TRAMADOL HCL 50 MG PO TABS
50.0000 mg | ORAL_TABLET | Freq: Two times a day (BID) | ORAL | Status: DC | PRN
  Administered 2016-08-10 – 2016-08-14 (×3): 50 mg via ORAL
  Filled 2016-08-10 (×3): qty 1

## 2016-08-10 MED ORDER — ONDANSETRON HCL 4 MG PO TABS: 4.0000 mg | ORAL_TABLET | Freq: Four times a day (QID) | ORAL | Status: DC | PRN

## 2016-08-10 MED ORDER — ACETAMINOPHEN 325 MG PO TABS
650.0000 mg | ORAL_TABLET | Freq: Four times a day (QID) | ORAL | Status: DC | PRN
  Administered 2016-08-12: 650 mg via ORAL
  Filled 2016-08-10: qty 2

## 2016-08-10 MED ORDER — SODIUM CHLORIDE 0.9% FLUSH
3.0000 mL | Freq: Two times a day (BID) | INTRAVENOUS | Status: DC
  Administered 2016-08-10 – 2016-08-17 (×13): 3 mL via INTRAVENOUS

## 2016-08-10 MED ORDER — INSULIN ASPART 100 UNIT/ML ~~LOC~~ SOLN
0.0000 [IU] | Freq: Three times a day (TID) | SUBCUTANEOUS | Status: DC
  Administered 2016-08-10: 2 [IU] via SUBCUTANEOUS
  Administered 2016-08-10 – 2016-08-11 (×2): 4 [IU] via SUBCUTANEOUS
  Administered 2016-08-11 (×2): 2 [IU] via SUBCUTANEOUS
  Administered 2016-08-11: 4 [IU] via SUBCUTANEOUS
  Administered 2016-08-12: 2 [IU] via SUBCUTANEOUS
  Administered 2016-08-12 – 2016-08-13 (×3): 4 [IU] via SUBCUTANEOUS
  Administered 2016-08-13 (×2): 2 [IU] via SUBCUTANEOUS
  Administered 2016-08-13: 4 [IU] via SUBCUTANEOUS
  Administered 2016-08-14: 2 [IU] via SUBCUTANEOUS
  Administered 2016-08-14: 4 [IU] via SUBCUTANEOUS
  Administered 2016-08-14: 2 [IU] via SUBCUTANEOUS
  Administered 2016-08-14: 8 [IU] via SUBCUTANEOUS
  Administered 2016-08-15 (×2): 2 [IU] via SUBCUTANEOUS
  Administered 2016-08-15: 8 [IU] via SUBCUTANEOUS
  Administered 2016-08-16 (×3): 2 [IU] via SUBCUTANEOUS
  Administered 2016-08-16: 8 [IU] via SUBCUTANEOUS
  Administered 2016-08-17: 2 [IU] via SUBCUTANEOUS

## 2016-08-10 MED ORDER — ENOXAPARIN SODIUM 30 MG/0.3ML ~~LOC~~ SOLN
30.0000 mg | SUBCUTANEOUS | Status: DC
  Administered 2016-08-10 – 2016-08-17 (×8): 30 mg via SUBCUTANEOUS
  Filled 2016-08-10 (×8): qty 0.3

## 2016-08-10 MED ORDER — BISACODYL 10 MG RE SUPP: 10.0000 mg | Freq: Every day | RECTAL | Status: DC | PRN

## 2016-08-10 MED ORDER — ONDANSETRON HCL 4 MG/2ML IJ SOLN: 4.0000 mg | Freq: Four times a day (QID) | INTRAMUSCULAR | Status: DC | PRN

## 2016-08-10 MED ORDER — SODIUM CHLORIDE 0.9% FLUSH: 3.0000 mL | INTRAVENOUS | Status: DC | PRN

## 2016-08-10 MED ORDER — MOVING RIGHT ALONG BOOK
Freq: Once | Status: AC
  Administered 2016-08-10: 08:00:00
  Filled 2016-08-10: qty 1

## 2016-08-10 MED ORDER — OXYCODONE HCL 5 MG PO TABS
5.0000 mg | ORAL_TABLET | ORAL | Status: DC | PRN
  Administered 2016-08-10 – 2016-08-17 (×20): 5 mg via ORAL
  Filled 2016-08-10 (×20): qty 1

## 2016-08-10 MED ORDER — BISACODYL 5 MG PO TBEC: 10.0000 mg | DELAYED_RELEASE_TABLET | Freq: Every day | ORAL | Status: DC | PRN

## 2016-08-10 MED ORDER — SODIUM CHLORIDE 0.9 % IV SOLN: 250.0000 mL | INTRAVENOUS | Status: DC | PRN

## 2016-08-10 MED ORDER — POTASSIUM CHLORIDE 20 MEQ PO PACK
40.0000 meq | PACK | Freq: Once | ORAL | Status: AC
  Filled 2016-08-10: qty 2

## 2016-08-10 MED ORDER — POTASSIUM CHLORIDE CRYS ER 20 MEQ PO TBCR
20.0000 meq | EXTENDED_RELEASE_TABLET | Freq: Two times a day (BID) | ORAL | Status: AC
  Administered 2016-08-10 (×2): 20 meq via ORAL
  Filled 2016-08-10 (×2): qty 1

## 2016-08-10 NOTE — Progress Notes (Signed)
Patient admitted to 2W25 A&Ox4, VSS. Tele applied and CCMD notified. Patient oriented to room and denies any needs at this time.

## 2016-08-10 NOTE — Progress Notes (Signed)
Assessment:  1 CKD 4/5 2 Nonoliguric AKI "plateauing" 3 Urinary retention 4 s/p CABG 5 Hypokalemia  Plan: Supportive, K PO  Subjective: Interval History: Feels ok  Objective: Vital signs in last 24 hours: Temp:  [97.8 F (36.6 C)-98.9 F (37.2 C)] 97.9 F (36.6 C) (06/27 1140) Pulse Rate:  [63-80] 63 (06/27 1200) Resp:  [14-25] 18 (06/27 1200) BP: (115-163)/(59-99) 146/59 (06/27 1200) SpO2:  [92 %-98 %] 97 % (06/27 1200) Weight:  [89.9 kg (198 lb 3.1 oz)] 89.9 kg (198 lb 3.1 oz) (06/27 0600) Weight change: -3.359 kg (-7 lb 6.5 oz)  Intake/Output from previous day: 06/26 0701 - 06/27 0700 In: 880 [P.O.:880] Out: 1015 [Urine:1015] Intake/Output this shift: Total I/O In: 300 [P.O.:300] Out: 675 [Urine:675]  General appearance: alert and cooperative Resp: clear to auscultation bilaterally Cardio: regular rate and rhythm, S1, S2 normal, no murmur, click, rub or gallop Extremities: extremities normal, atraumatic, no cyanosis or edema  Speech sl dyarthric  Lab Results:  Recent Labs  08/09/16 0528 08/10/16 0423  WBC 9.8 7.2  HGB 8.4* 8.2*  HCT 26.3* 25.5*  PLT 167 194   BMET:  Recent Labs  08/09/16 0528 08/10/16 0423  NA 134* 137  K 3.8 3.0*  CL 105 106  CO2 17* 20*  GLUCOSE 155* 122*  BUN 57* 64*  CREATININE 4.33* 4.29*  CALCIUM 8.6* 8.4*   No results for input(s): PTH in the last 72 hours. Iron Studies: No results for input(s): IRON, TIBC, TRANSFERRIN, FERRITIN in the last 72 hours. Studies/Results: US Renal Port  Result Date: 08/09/2016 CLINICAL DATA:  71 year old presenting with acute renal insufficiency superimposed upon chronic kidney disease. Current history of hypertension, diabetes. CABG 08/05/2016. EXAM: RENAL / URINARY TRACT ULTRASOUND COMPLETE COMPARISON:  Visualized kidneys on MRI lumbar spine 04/08/2009. FINDINGS: Right Kidney: Length: Approximately 12.5 cm. Echogenic parenchyma. No hydronephrosis. Approximate 1.0 x 1.0 x 1.1 cm simple  cyst arising from the upper pole. Approximate 1.3 x 1.4 x 1.1 cm simple cyst arising from the mid to upper pole. Approximate 1.5 x 1.3 x 1.6 cm cyst arising from the mid kidney. No solid renal masses. Left Kidney: Length: Approximately 8.9 cm. Markedly echogenic parenchyma with severe diffuse cortical thinning. No hydronephrosis. Approximate 2.0 x 1.8 x 1.7 cm cyst arising from the upper pole as noted on the prior MRI. No solid renal masses. Bladder: Decompressed by Foley catheter. IMPRESSION: 1. No evidence of urinary tract obstruction. 2. Atrophic left kidney. Echogenic parenchyma involving both kidneys indicating chronic medical renal disease. 3. Benign bilateral renal cysts.  No solid renal masses. Electronically Signed   By: Evangeline Dakin M.D.   On: 08/09/2016 15:39   Dg Chest Port 1 View  Result Date: 08/09/2016 CLINICAL DATA:  71 year old male post CABG. Sore chest. Subsequent encounter. EXAM: PORTABLE CHEST 1 VIEW COMPARISON:  08/08/2016. FINDINGS: Post CABG.  Heart size top-normal. Calcified slightly tortuous aorta. Bibasilar and perihilar subsegmental atelectasis. Elevated right hemidiaphragm. Gas filled colon extends below diaphragm limiting evaluation for detection of possibility of free intraperitoneal air. Central pulmonary vascular prominence. No pneumothorax. IMPRESSION: Post CABG with top-normal heart size. Subsegmental atelectasis perihilar and lower lobe region bilaterally. Central pulmonary vascular prominence. Elevated right hemidiaphragm. Gas filled colon extends below diaphragm limiting evaluation for detection of possibility of free intraperitoneal air. Electronically Signed   By: Genia Del M.D.   On: 08/09/2016 07:22    Scheduled: . allopurinol  100 mg Oral Daily  . amLODipine  10 mg Oral Daily  .  carvedilol  25 mg Oral BID WC  . doxazosin  4 mg Oral BID  . enoxaparin (LOVENOX) injection  30 mg Subcutaneous Q24H  . furosemide  80 mg Oral Daily  . insulin aspart  0-24  Units Subcutaneous TID AC & HS  . insulin aspart  3 Units Subcutaneous TID WC  . levothyroxine  100 mcg Oral QAC breakfast  . lubiprostone  24 mcg Oral BID  . pantoprazole  40 mg Oral QAC breakfast  . potassium chloride  20 mEq Oral BID  . rosuvastatin  10 mg Oral QPM  . sodium chloride flush  3 mL Intravenous Q12H  . tamsulosin  0.4 mg Oral Daily    LOS: 15 days   Aleira Deiter C 08/10/2016,1:14 PM

## 2016-08-10 NOTE — Progress Notes (Addendum)
Patient ID: [name], male   DOB: 01/01/1946, 71 y.o.   MRN: [id number] T[facility] DAILY ICU PROGRESS NOTE                   [address]            [place] [address]          [phone]   5 Days Post-Op Procedure(s) (LRB): CORONARY ARTERY BYPASS GRAFTING (CABG) x 3, LIMA to LAD, SVG to DIAGONAL, SVG to OM1, SVG to DISTAL RCA, USING LEFT MAMMARY ARTERY AND RIGHT GREATER SAPHENOUS VEIN HARVESTED ENDOSCOPICALLY (N/A) TRANSESOPHAGEAL ECHOCARDIOGRAM (TEE) (N/A)  Total Length of Stay:  LOS: 15 days   Subjective: Feels well, says right side is same as was preop   Objective: Vital signs in last 24 hours: Temp:  [98.1 F (36.7 C)-98.9 F (37.2 C)] 98.8 F (37.1 C) (06/27 0400) Pulse Rate:  [66-80] 72 (06/27 0600) Cardiac Rhythm: Normal sinus rhythm;Bundle branch block;Heart block (06/26 2000) Resp:  [14-25] 22 (06/27 0600) BP: (115-170)/(58-77) 149/60 (06/27 0600) SpO2:  [92 %-98 %] 95 % (06/27 0600) Weight:  [198 lb 3.1 oz (89.9 kg)] 198 lb 3.1 oz (89.9 kg) (06/27 0600)  Filed Weights   08/08/16 0600 08/09/16 0500 08/10/16 0600  Weight: 211 lb 6.7 oz (95.9 kg) 205 lb 9.6 oz (93.3 kg) 198 lb 3.1 oz (89.9 kg)    Weight change: -7 lb 6.5 oz (-3.359 kg)   Hemodynamic parameters for last 24 hours:    Intake/Output from previous day: 06/26 0701 - 06/27 0700 In: 880 [P.O.:880] Out: 1015 [Urine:1015]  Intake/Output this shift: No intake/output data recorded.  Current Meds: Scheduled Meds: . allopurinol  100 mg Oral Daily  . amLODipine  10 mg Oral Daily  . carvedilol  25 mg Oral BID WC  . doxazosin  4 mg Oral BID  . enoxaparin (LOVENOX) injection  30 mg Subcutaneous Q24H  . furosemide  80 mg Oral Daily  . insulin aspart  0-24 Units Subcutaneous TID AC & HS  . insulin aspart  3 Units Subcutaneous TID WC  . levothyroxine  100 mcg Oral QAC breakfast  . lubiprostone  24 mcg Oral BID  . moving right along book   Does not apply Once  . pantoprazole  40 mg Oral  QAC breakfast  . potassium chloride  20 mEq Oral BID  . rosuvastatin  10 mg Oral QPM  . sodium chloride flush  3 mL Intravenous Q12H  . tamsulosin  0.4 mg Oral Daily   Continuous Infusions: . sodium chloride     PRN Meds:.sodium chloride, acetaminophen, bisacodyl **OR** bisacodyl, ondansetron **OR** ondansetron (ZOFRAN) IV, oxyCODONE, sodium chloride flush, traMADol  General appearance: alert, cooperative, appears older than stated age and no distress Neurologic: intact Heart: regular rate and rhythm, S1, S2 normal, no murmur, click, rub or gallop Lungs: diminished breath sounds bibasilar Abdomen: soft, non-tender; bowel sounds normal; no masses,  no organomegaly Extremities: extremities normal, atraumatic, no cyanosis or edema and Homans sign is negative, no sign of DVT Wound: sternum intact  Lab Results: CBC:  Recent Labs  08/09/16 0528 08/10/16 0423  WBC 9.8 7.2  HGB 8.4* 8.2*  HCT 26.3* 25.5*  PLT 167 194   BMET:   Recent Labs  08/09/16 0528 08/10/16 0423  NA 134* 137  K 3.8 3.0*  CL 105 106  CO2 17* 20*  GLUCOSE 155* 122*  BUN 57* 64*  CREATININE 4.33* 4.29*  CALCIUM 8.6*  8.4*    CMET: Lab Results  Component Value Date   WBC 7.2 08/10/2016   HGB 8.2 (L) 08/10/2016   HCT 25.5 (L) 08/10/2016   PLT 194 08/10/2016   GLUCOSE 122 (H) 08/10/2016   CHOL 99 07/26/2016   TRIG 80 07/26/2016   HDL 38 (L) 07/26/2016   LDLCALC 45 07/26/2016   ALT 37 08/04/2016   AST 31 08/04/2016   NA 137 08/10/2016   K 3.0 (L) 08/10/2016   CL 106 08/10/2016   CREATININE 4.29 (H) 08/10/2016   BUN 64 (H) 08/10/2016   CO2 20 (L) 08/10/2016   TSH 0.565 07/25/2016   INR 1.27 08/05/2016   HGBA1C 7.1 (H) 08/05/2016      PT/INR: No results for input(s): LABPROT, INR in the last 72 hours. Radiology: US Renal Port  Result Date: 08/09/2016 CLINICAL DATA:  71 year old presenting with acute renal insufficiency superimposed upon chronic kidney disease. Current history of  hypertension, diabetes. CABG 08/05/2016. EXAM: RENAL / URINARY TRACT ULTRASOUND COMPLETE COMPARISON:  Visualized kidneys on MRI lumbar spine 04/08/2009. FINDINGS: Right Kidney: Length: Approximately 12.5 cm. Echogenic parenchyma. No hydronephrosis. Approximate 1.0 x 1.0 x 1.1 cm simple cyst arising from the upper pole. Approximate 1.3 x 1.4 x 1.1 cm simple cyst arising from the mid to upper pole. Approximate 1.5 x 1.3 x 1.6 cm cyst arising from the mid kidney. No solid renal masses. Left Kidney: Length: Approximately 8.9 cm. Markedly echogenic parenchyma with severe diffuse cortical thinning. No hydronephrosis. Approximate 2.0 x 1.8 x 1.7 cm cyst arising from the upper pole as noted on the prior MRI. No solid renal masses. Bladder: Decompressed by Foley catheter. IMPRESSION: 1. No evidence of urinary tract obstruction. 2. Atrophic left kidney. Echogenic parenchyma involving both kidneys indicating chronic medical renal disease. 3. Benign bilateral renal cysts.  No solid renal masses. Electronically Signed   By: Evangeline Dakin M.D.   On: 08/09/2016 15:39     Assessment/Plan: S/P Procedure(s) (LRB): CORONARY ARTERY BYPASS GRAFTING (CABG) x 3, LIMA to LAD, SVG to DIAGONAL, SVG to OM1, SVG to DISTAL RCA, USING LEFT MAMMARY ARTERY AND RIGHT GREATER SAPHENOUS VEIN HARVESTED ENDOSCOPICALLY (N/A) TRANSESOPHAGEAL ECHOCARDIOGRAM (TEE) (N/A) Mobilize Diabetes control Continue foley due to bladder outlet obstruction now foley to go back in with 500 ml residual and not able to pass urine , on Cardura and flomax already urology consult  To steop down  Chronic preop anemia  Chronic stage iv re anal disease    [name] 08/10/2016 7:38 AM

## 2016-08-10 NOTE — Clinical Social Work Note (Signed)
Clinical Social Work Assessment  Patient Details  Name: [name] MRN: [id number] Date of Birth: 16-May-1945  Date of referral:  08/10/16               Reason for consult:  Facility Placement                Permission sought to share information with:  Facility Sport and exercise psychologist, Family Supports Permission granted to share information::  Yes, Verbal Permission Granted  Name::     Chief Operating Officer::  SNF  Relationship::  wife  Contact Information:     Housing/Transportation Living arrangements for the past 2 months:  Apartment Source of Information:  Patient, Spouse Patient Interpreter Needed:  None Criminal Activity/Legal Involvement Pertinent to Current Situation/Hospitalization:  No - Comment as needed Significant Relationships:  Spouse Lives with:  Spouse Do you feel safe going back to the place where you live?  No Need for family participation in patient care:  No (Coment)  Care giving concerns:  Pt lives at home with wife who is unable to assist much physically- patient now with sternal precautions and decreased mobility.   Social Worker assessment / plan:  CSW spoke with pt concerning PT recommendation for rehab- explained CIR vs SNF and possible barriers to CIR acceptance.    Employment status:  Retired Nurse, adult PT Recommendations:  [place] / Referral to community resources:  [place]  Patient/Family's Response to care:  Pt agreeable to looking into SNF options but hopeful he will be well enough to go home.  Patient/Family's Understanding of and Emotional Response to Diagnosis, Current Treatment, and Prognosis:  Pt realistic about possible need for inpatient rehab option at DC but hopeful to return home.  Emotional Assessment Appearance:  Appears stated age Attitude/Demeanor/Rapport:    Affect (typically observed):  Appropriate, Accepting Orientation:  Oriented to Self, Oriented to  Place, Oriented to  Time, Oriented to Situation Alcohol / Substance use:  Not Applicable Psych involvement (Current and /or in the community):  No (Comment)  Discharge Needs  Concerns to be addressed:  Care Coordination Readmission within the last 30 days:  No Current discharge risk:  Physical Impairment Barriers to Discharge:  Continued Medical Work up   Jorge Ny, LCSW 08/10/2016, 12:58 PM

## 2016-08-10 NOTE — NC FL2 (Signed)
[place] LEVEL OF CARE SCREENING TOOL     IDENTIFICATION  Patient Name: [name] Birthdate: 1945/12/23 Sex: male Admission Date (Current Location): 07/25/2016  [facility] and [place] Number:  [employer] and Address:  [facility], [place] [address] [place], [place] [address]      Provider Number: [id number]  Attending Physician Name and Address:  Pixie Casino, MD  Relative Name and Phone Number:  Anne Ng, spouse, [phone]    Current Level of Care: Hospital Recommended Level of Care: [place] Prior Approval Number:    Date Approved/Denied:   PASRR Number: [id number]  Discharge Plan: SNF    Current Diagnoses: Patient Active Problem List   Diagnosis Date Noted  . Acute pulmonary edema (HCC)   . Hypoxemia   . Acute respiratory failure with hypoxia ([place])   . Acute on chronic combined systolic and diastolic CHF (congestive heart failure) ([place]) 07/25/2016  . Essential hypertension 07/25/2016  . 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 AKI (acute kidney injury) ([place]) 07/25/2016  . CKD (chronic kidney disease) stage 4, GFR 15-29 ml/min (HCC) 07/25/2016  . Elevated troponin 07/25/2016  . Hypothyroidism 07/25/2016  . Dyslipidemia 07/25/2016  . Diabetes mellitus, insulin-dependent (IDDM or type I) ([place]) 07/25/2016  . NSTEMI (non-ST elevated myocardial infarction) ([place]) 07/25/2016    Orientation RESPIRATION BLADDER Height & Weight     Self, Time, Situation, Place  Normal Continent, Indwelling catheter Weight: 198 lb 3.1 oz (89.9 kg) Height:  6\' 1"  (185.4 cm)  BEHAVIORAL SYMPTOMS/MOOD NEUROLOGICAL BOWEL NUTRITION STATUS      Continent Diet (Please see DC Summary)  AMBULATORY STATUS COMMUNICATION OF NEEDS Skin   Limited Assist Verbally Surgical wounds (Closed incision on chest and leg)                       Personal Care Assistance Level of Assistance  Bathing, Feeding  Bathing Assistance: Limited assistance Feeding assistance: Independent Dressing Assistance: Limited assistance     Functional Limitations Info             SPECIAL CARE FACTORS FREQUENCY  PT (By licensed PT)     PT Frequency: 5x/week              Contractures      Additional Factors Info  Code Status, Allergies, Insulin Sliding Scale Code Status Info: Full Allergies Info: NKA   Insulin Sliding Scale Info: 3x daily with meals and at bedtime       Current Medications (08/10/2016):  This is the current hospital active medication list Current Facility-Administered Medications  Medication Dose Route Frequency Provider Last Rate Last Dose  . 0.9 %  sodium chloride infusion  250 mL Intravenous PRN [name], MD      . acetaminophen (TYLENOL) tablet 650 mg  650 mg Oral Q6H PRN [name], MD      . allopurinol (ZYLOPRIM) tablet 100 mg  100 mg Oral Daily Bhagat, Bhavinkumar, PA   100 mg at 08/10/16 0915  . amLODipine (NORVASC) tablet 10 mg  10 mg Oral Daily Gold, Wayne E, PA-C   10 mg at 08/10/16 0916  . bisacodyl (DULCOLAX) EC tablet 10 mg  10 mg Oral Daily PRN [name], MD       Or  . bisacodyl (DULCOLAX) suppository 10 mg  10 mg Rectal Daily PRN [name], MD      . carvedilol (  COREG) tablet 25 mg  25 mg Oral BID WC Prescott Gum, Collier Salina, MD   25 mg at 08/10/16 6387  . doxazosin (CARDURA) tablet 4 mg  4 mg Oral BID [place] Gum, Collier Salina, MD   4 mg at 08/10/16 0916  . enoxaparin (LOVENOX) injection 30 mg  30 mg Subcutaneous Q24H [name], MD   30 mg at 08/10/16 5643  . furosemide (LASIX) tablet 80 mg  80 mg Oral Daily [name], MD   80 mg at 08/10/16 0916  . insulin aspart (novoLOG) injection 0-24 Units  0-24 Units Subcutaneous TID AC & HS [name], MD   2 Units at 08/10/16 [phone]  . insulin aspart (novoLOG) injection 3 Units  3 Units Subcutaneous TID WC Ivin Poot, MD   3 Units at 08/10/16 1146  . levothyroxine  (SYNTHROID, LEVOTHROID) tablet 100 mcg  100 mcg Oral QAC breakfast Bhagat, Bhavinkumar, PA   100 mcg at 08/10/16 0636  . lubiprostone (AMITIZA) capsule 24 mcg  24 mcg Oral BID Bhagat, Bhavinkumar, PA   24 mcg at 08/10/16 0917  . ondansetron (ZOFRAN) tablet 4 mg  4 mg Oral Q6H PRN [name], MD       Or  . ondansetron [facility]) injection 4 mg  4 mg Intravenous Q6H PRN [name], MD      . oxyCODONE (Oxy IR/ROXICODONE) immediate release tablet 5 mg  5 mg Oral Q3H PRN [name], MD      . pantoprazole (PROTONIX) EC tablet 40 mg  40 mg Oral QAC breakfast [name], MD   40 mg at 08/10/16 0829  . potassium chloride SA (K-DUR,KLOR-CON) CR tablet 20 mEq  20 mEq Oral BID [name], MD   20 mEq at 08/10/16 0829  . rosuvastatin (CRESTOR) tablet 10 mg  10 mg Oral QPM Bhagat, Bhavinkumar, PA   10 mg at 08/09/16 1719  . sodium chloride flush (NS) 0.9 % injection 3 mL  3 mL Intravenous Q12H [name], MD   3 mL at 08/10/16 1884  . sodium chloride flush (NS) 0.9 % injection 3 mL  3 mL Intravenous PRN [name], MD      . tamsulosin [facility]) capsule 0.4 mg  0.4 mg Oral Daily [place] Gum, Collier Salina, MD   0.4 mg at 08/10/16 0916  . traMADol (ULTRAM) tablet 50 mg  50 mg Oral Q12H PRN [name], MD         Discharge Medications: Please see discharge summary for a list of discharge medications.  Relevant Imaging Results:  Relevant Lab Results:   Additional Information SSN: [id number]  Jorge Ny, LCSW

## 2016-08-10 NOTE — Consult Note (Signed)
Urology Consult   Physician requesting consult: Dr. Darius Bump  Reason for consult: Urinary retention  History of Present Illness: [name] is a 71 y.o. patient with a history of BPH followed by Dr. Burman Nieves.  He is apparently on both tamsulosin and doxazosin (?) for treatment of BPH.  He is admitted to the hospital after presenting for cardiac catheterization and subsequent CABG on 08/05/16.  He has been noted to be in urinary retention and has had multiple voiding trials that have been unsuccessful requiring replacement of his catheter.   Past Medical History:  Diagnosis Date  . Carotid artery occlusion   . Chronic kidney disease   . Diabetes mellitus without complication ([place])   . Hypertension     Past Surgical History:  Procedure Laterality Date  . CORONARY ARTERY BYPASS GRAFT N/A 08/05/2016   Procedure: CORONARY ARTERY BYPASS GRAFTING (CABG) x 3, LIMA to LAD, SVG to DIAGONAL, SVG to OM1, SVG to DISTAL RCA, USING LEFT MAMMARY ARTERY AND RIGHT GREATER SAPHENOUS VEIN HARVESTED ENDOSCOPICALLY;  Surgeon: [name], MD;  Location: [facility];  Service: Open Heart Surgery;  Laterality: N/A;  . LEFT HEART CATH AND CORONARY ANGIOGRAPHY N/A 07/28/2016   Procedure: Left Heart Cath and Coronary Angiography;  Surgeon: Jettie Booze, MD;  Location: [facility];  Service: Cardiovascular;  Laterality: N/A;  . LUMBAR FUSION  2005  . TEE WITHOUT CARDIOVERSION N/A 08/05/2016   Procedure: TRANSESOPHAGEAL ECHOCARDIOGRAM (TEE);  Surgeon: [name], MD;  Location: [facility];  Service: Open Heart Surgery;  Laterality: N/A;    Current Hospital Medications:  Home Meds:  Current Meds  Medication Sig  . allopurinol (ZYLOPRIM) 100 MG tablet Take 100 mg by mouth daily.  . AMITIZA 24 MCG capsule Take 24 mcg by mouth 2 (two) times daily.  Marland Kitchen amLODipine (NORVASC) 10 MG tablet Take 10 mg by mouth daily.  . betamethasone valerate (VALISONE) 0.1 % cream Apply 1 application topically  daily.   Marland Kitchen BIDIL 20-37.5 MG tablet Take 1 tablet by mouth 2 (two) times daily.  . carvedilol (COREG) 25 MG tablet Take 25 mg by mouth 2 (two) times daily.  . CVS VITAMIN D 2000 units CAPS Take 1 capsule by mouth daily.  Marland Kitchen doxazosin (CARDURA) 4 MG tablet Take 4 mg by mouth 2 (two) times daily.  . fluticasone (FLONASE) 50 MCG/ACT nasal spray USE 2 SPRAYS IN EACH NOSTRIL AT BEDTIME  . furosemide (LASIX) 80 MG tablet Take 80 mg by mouth daily.  Marland Kitchen HUMULIN 70/30 (70-30) 100 UNIT/ML injection Inject 5-10 Units into the skin 2 (two) times daily with a meal.   . levothyroxine (SYNTHROID, LEVOTHROID) 100 MCG tablet Take 100 mcg by mouth daily.  . rosuvastatin (CRESTOR) 10 MG tablet Take 10 mg by mouth every evening.  . sodium bicarbonate 650 MG tablet Take 650 mg by mouth 2 (two) times daily.  . tamsulosin (FLOMAX) 0.4 MG CAPS capsule Take 0.8 mg by mouth every evening.    Scheduled Meds: . allopurinol  100 mg Oral Daily  . amLODipine  10 mg Oral Daily  . carvedilol  25 mg Oral BID WC  . doxazosin  4 mg Oral BID  . enoxaparin (LOVENOX) injection  30 mg Subcutaneous Q24H  . furosemide  80 mg Oral Daily  . insulin aspart  0-24 Units Subcutaneous TID AC & HS  . insulin aspart  3 Units Subcutaneous TID WC  . levothyroxine  100 mcg Oral QAC breakfast  .  lubiprostone  24 mcg Oral BID  . pantoprazole  40 mg Oral QAC breakfast  . potassium chloride  20 mEq Oral BID  . rosuvastatin  10 mg Oral QPM  . sodium chloride flush  3 mL Intravenous Q12H  . tamsulosin  0.4 mg Oral Daily   Continuous Infusions: . sodium chloride     PRN Meds:.sodium chloride, acetaminophen, bisacodyl **OR** bisacodyl, ondansetron **OR** ondansetron (ZOFRAN) IV, oxyCODONE, sodium chloride flush, traMADol  Allergies:  Allergies  Allergen Reactions  . No Known Allergies     Family History  Problem Relation Age of Onset  . Heart disease Mother   . Hypertension Mother   . Hypertension Father     Social History:   reports that he has quit smoking. He has never used smokeless tobacco. He reports that he does not drink alcohol or use drugs.  ROS: A complete review of systems was performed.  All systems are negative except for pertinent findings as noted.  Physical Exam:  Vital signs in last 24 hours: Temp:  [97.8 F (36.6 C)-98.8 F (37.1 C)] 98.4 F (36.9 C) (06/27 1500) Pulse Rate:  [63-80] 65 (06/27 1717) Resp:  [14-25] 18 (06/27 1200) BP: (115-162)/(59-99) 162/61 (06/27 1717) SpO2:  [92 %-98 %] 97 % (06/27 1200) Weight:  [89.9 kg (198 lb 3.1 oz)] 89.9 kg (198 lb 3.1 oz) (06/27 0600) Constitutional:  Alert and oriented, No acute distress Cardiovascular:, No JVD Respiratory: Normal respiratory effort GI: Abdomen is soft, nontender GU: No CVA tenderness, Catheter is indwelling with grossly clear urine Lymphatic: No lymphadenopathy Neurologic: Grossly intact, no focal deficits Psychiatric: Normal mood and affect  Laboratory Data:   Recent Labs  08/08/16 0305 08/09/16 0528 08/10/16 0423  WBC 11.9* 9.8 7.2  HGB 8.2* 8.4* 8.2*  HCT 25.2* 26.3* 25.5*  PLT 147* 167 194     Recent Labs  08/08/16 0305 08/09/16 0528 08/10/16 0423  NA 136 134* 137  K 4.0 3.8 3.0*  CL 105 105 106  GLUCOSE 141* 155* 122*  BUN 52* 57* 64*  CALCIUM 8.6* 8.6* 8.4*  CREATININE 4.13* 4.33* 4.29*     Results for orders placed or performed during the hospital encounter of 07/25/16 (from the past 24 hour(s))  Glucose, capillary     Status: Abnormal   Collection Time: 08/09/16  8:43 PM  Result Value Ref Range   Glucose-Capillary 142 (H) 65 - 99 mg/dL   Comment 1 Capillary Specimen   Glucose, capillary     Status: Abnormal   Collection Time: 08/09/16 11:40 PM  Result Value Ref Range   Glucose-Capillary 149 (H) 65 - 99 mg/dL   Comment 1 Capillary Specimen   Glucose, capillary     Status: Abnormal   Collection Time: 08/10/16  3:41 AM  Result Value Ref Range   Glucose-Capillary 125 (H) 65 - 99 mg/dL    Comment 1 Capillary Specimen   Basic metabolic panel     Status: Abnormal   Collection Time: 08/10/16  4:23 AM  Result Value Ref Range   Sodium 137 135 - 145 mmol/L   Potassium 3.0 (L) 3.5 - 5.1 mmol/L   Chloride 106 101 - 111 mmol/L   CO2 20 (L) 22 - 32 mmol/L   Glucose, Bld 122 (H) 65 - 99 mg/dL   BUN 64 (H) 6 - 20 mg/dL   Creatinine, Ser 4.29 (H) 0.61 - 1.24 mg/dL   Calcium 8.4 (L) 8.9 - 10.3 mg/dL   GFR calc non Af Wyvonnia Lora  13 (L) >60 mL/min   GFR calc Af Amer 15 (L) >60 mL/min   Anion gap 11 5 - 15  CBC     Status: Abnormal   Collection Time: 08/10/16  4:23 AM  Result Value Ref Range   WBC 7.2 4.0 - 10.5 K/uL   RBC 2.84 (L) 4.22 - 5.81 MIL/uL   Hemoglobin 8.2 (L) 13.0 - 17.0 g/dL   HCT 25.5 (L) 39.0 - 52.0 %   MCV 89.8 78.0 - 100.0 fL   MCH 28.9 26.0 - 34.0 pg   MCHC 32.2 30.0 - 36.0 g/dL   RDW 15.1 11.5 - 15.5 %   Platelets 194 150 - 400 K/uL  Glucose, capillary     Status: Abnormal   Collection Time: 08/10/16  8:43 AM  Result Value Ref Range   Glucose-Capillary 130 (H) 65 - 99 mg/dL  Glucose, capillary     Status: Abnormal   Collection Time: 08/10/16 11:43 AM  Result Value Ref Range   Glucose-Capillary 109 (H) 65 - 99 mg/dL   Comment 1 Notify RN   Glucose, capillary     Status: None   Collection Time: 08/10/16  4:01 PM  Result Value Ref Range   Glucose-Capillary 86 65 - 99 mg/dL   Recent Results (from the past 240 hour(s))  Surgical pcr screen     Status: None   Collection Time: 08/05/16  3:27 AM  Result Value Ref Range Status   MRSA, PCR NEGATIVE NEGATIVE Final   Staphylococcus aureus NEGATIVE NEGATIVE Final    Comment:        The Xpert SA Assay (FDA approved for NASAL specimens in patients over 28 years of age), is one component of a comprehensive surveillance program.  Test performance has been validated by [facility] for patients greater than or equal to 30 year old. It is not intended to diagnose infection nor to guide or monitor treatment.      Renal Function:  Recent Labs  08/06/16 0310 08/06/16 1707 08/06/16 1724 08/07/16 0358 08/08/16 0305 08/09/16 0528 08/10/16 0423  CREATININE 2.96* 3.27* 3.10* 3.56* 4.13* 4.33* 4.29*   Estimated Creatinine Clearance: 17.8 mL/min (A) (by C-G formula based on SCr of 4.29 mg/dL (H)).  Radiologic Imaging: US Renal Port  Result Date: 08/09/2016 CLINICAL DATA:  71 year old presenting with acute renal insufficiency superimposed upon chronic kidney disease. Current history of hypertension, diabetes. CABG 08/05/2016. EXAM: RENAL / URINARY TRACT ULTRASOUND COMPLETE COMPARISON:  Visualized kidneys on MRI lumbar spine 04/08/2009. FINDINGS: Right Kidney: Length: Approximately 12.5 cm. Echogenic parenchyma. No hydronephrosis. Approximate 1.0 x 1.0 x 1.1 cm simple cyst arising from the upper pole. Approximate 1.3 x 1.4 x 1.1 cm simple cyst arising from the mid to upper pole. Approximate 1.5 x 1.3 x 1.6 cm cyst arising from the mid kidney. No solid renal masses. Left Kidney: Length: Approximately 8.9 cm. Markedly echogenic parenchyma with severe diffuse cortical thinning. No hydronephrosis. Approximate 2.0 x 1.8 x 1.7 cm cyst arising from the upper pole as noted on the prior MRI. No solid renal masses. Bladder: Decompressed by Foley catheter. IMPRESSION: 1. No evidence of urinary tract obstruction. 2. Atrophic left kidney. Echogenic parenchyma involving both kidneys indicating chronic medical renal disease. 3. Benign bilateral renal cysts.  No solid renal masses. Electronically Signed   By: Evangeline Dakin M.D.   On: 08/09/2016 15:39   Dg Chest Port 1 View  Result Date: 08/09/2016 CLINICAL DATA:  71 year old male post CABG. Sore chest. Subsequent encounter. EXAM:  PORTABLE CHEST 1 VIEW COMPARISON:  08/08/2016. FINDINGS: Post CABG.  Heart size top-normal. Calcified slightly tortuous aorta. Bibasilar and perihilar subsegmental atelectasis. Elevated right hemidiaphragm. Gas filled colon extends below  diaphragm limiting evaluation for detection of possibility of free intraperitoneal air. Central pulmonary vascular prominence. No pneumothorax. IMPRESSION: Post CABG with top-normal heart size. Subsegmental atelectasis perihilar and lower lobe region bilaterally. Central pulmonary vascular prominence. Elevated right hemidiaphragm. Gas filled colon extends below diaphragm limiting evaluation for detection of possibility of free intraperitoneal air. Electronically Signed   By: Genia Del M.D.   On: 08/09/2016 07:22    I independently reviewed the above imaging studies.  Impression/Recommendation  1) Urinary retention with history of BPH: Continue urethral catheter during hospitalization.  Discharge with catheter and arrange follow up with Dr. Burman Nieves for outpatient voiding trial.  Continue alpha blocker therapy.  2) AKI: No evidence to suggest obstructive etiology.  Defer to nephrology recommendations.  Rayquan Amrhein,LES 08/10/2016, 5:40 PM    Pryor Curia MD   CC: Dr. Darius Bump

## 2016-08-10 NOTE — Progress Notes (Signed)
Report given to [facility] RN at this time. Pt has no s/s of any acute distress.  No c/o pain .

## 2016-08-11 ENCOUNTER — Inpatient Hospital Stay (HOSPITAL_COMMUNITY): Payer: Medicare Other

## 2016-08-11 LAB — GLUCOSE, CAPILLARY
GLUCOSE-CAPILLARY: 154 mg/dL — AB (ref 65–99)
GLUCOSE-CAPILLARY: 178 mg/dL — AB (ref 65–99)
GLUCOSE-CAPILLARY: 158 mg/dL — AB (ref 65–99)
GLUCOSE-CAPILLARY: 183 mg/dL — AB (ref 65–99)

## 2016-08-11 LAB — CBC
HCT: 26.1 % — ABNORMAL LOW (ref 39.0–52.0)
Hemoglobin: 8.4 g/dL — ABNORMAL LOW (ref 13.0–17.0)
MCH: 29 pg (ref 26.0–34.0)
MCHC: 32.2 g/dL (ref 30.0–36.0)
MCV: 90 fL (ref 78.0–100.0)
Platelets: 211 10*3/uL (ref 150–400)
RBC: 2.9 MIL/uL — ABNORMAL LOW (ref 4.22–5.81)
RDW: 14.9 % (ref 11.5–15.5)
WBC: 6.6 10*3/uL (ref 4.0–10.5)

## 2016-08-11 LAB — BASIC METABOLIC PANEL
Anion gap: 10 (ref 5–15)
BUN: 65 mg/dL — ABNORMAL HIGH (ref 6–20)
CO2: 20 mmol/L — ABNORMAL LOW (ref 22–32)
Calcium: 8.6 mg/dL — ABNORMAL LOW (ref 8.9–10.3)
Chloride: 105 mmol/L (ref 101–111)
Creatinine, Ser: 4.32 mg/dL — ABNORMAL HIGH (ref 0.61–1.24)
GFR calc Af Amer: 15 mL/min — ABNORMAL LOW (ref >60)
GFR calc non Af Amer: 13 mL/min — ABNORMAL LOW (ref >60)
Glucose, Bld: 148 mg/dL — ABNORMAL HIGH (ref 65–99)
Potassium: 3.9 mmol/L (ref 3.5–5.1)
Sodium: 135 mmol/L (ref 135–145)

## 2016-08-11 MED ORDER — ISOSORB DINITRATE-HYDRALAZINE 20-37.5 MG PO TABS
1.0000 | ORAL_TABLET | Freq: Two times a day (BID) | ORAL | Status: DC
  Administered 2016-08-11 – 2016-08-14 (×7): 1 via ORAL
  Filled 2016-08-11 (×7): qty 1

## 2016-08-11 NOTE — Progress Notes (Addendum)
      [address]       [place] [address]             [phone]        6 Days Post-Op Procedure(s) (LRB): CORONARY ARTERY BYPASS GRAFTING (CABG) x 3, LIMA to LAD, SVG to DIAGONAL, SVG to OM1, SVG to DISTAL RCA, USING LEFT MAMMARY ARTERY AND RIGHT GREATER SAPHENOUS VEIN HARVESTED ENDOSCOPICALLY (N/A) TRANSESOPHAGEAL ECHOCARDIOGRAM (TEE) (N/A)  Subjective: Patient without specific complaints this am.  Objective: Vital signs in last 24 hours: Temp:  [97.8 F (36.6 C)-98.7 F (37.1 C)] 98.6 F (37 C) (06/28 0435) Pulse Rate:  [63-77] 77 (06/28 0435) Cardiac Rhythm: Normal sinus rhythm;Bundle branch block;Heart block (06/27 1900) Resp:  [14-19] 19 (06/28 0435) BP: (141-163)/(59-72) 159/60 (06/28 0435) SpO2:  [93 %-100 %] 97 % (06/28 0435) Weight:  [97.5 kg (214 lb 15.2 oz)] 97.5 kg (214 lb 15.2 oz) (06/28 0435)  Pre op weight 93.2 kg Current Weight  08/11/16 97.5 kg (214 lb 15.2 oz)      Intake/Output from previous day: 06/27 0701 - 06/28 0700 In: 300 [P.O.:300] Out: 1775 [Urine:1775]   Physical Exam:  Cardiovascular: RRR Pulmonary: Diminished at bases Abdomen: Soft, non tender, bowel sounds present. Extremities: Mild bilateral lower extremity edema. Wounds: LE wounds are clean and dry.  No erythema or signs of infection. Lower sternal wound has drainage.  Lab Results: CBC: Recent Labs  08/10/16 0423 08/11/16 0359  WBC 7.2 6.6  HGB 8.2* 8.4*  HCT 25.5* 26.1*  PLT 194 211   BMET:  Recent Labs  08/10/16 0423 08/11/16 0359  NA 137 135  K 3.0* 3.9  CL 106 105  CO2 20* 20*  GLUCOSE 122* 148*  BUN 64* 65*  CREATININE 4.29* 4.32*  CALCIUM 8.4* 8.6*    PT/INR:  Lab Results  Component Value Date   INR 1.27 08/05/2016   INR 1.12 08/05/2016   INR 1.09 07/28/2016   ABG:  INR: Will add last result for INR, ABG once components are confirmed Will add last 4 CBG results once components are confirmed  Assessment/Plan:  1. CV -  SBP in the 150's. On Norvasc 10 mg daily, Coreg 25 mg bid. Restart Bidil for better control of BP. 2.  Pulmonary - On room air. Encourage incentive spirometer. CXR ordered but not taken yet. 3. AKI-creatinine 4.32 this am. Nephrology following. 4.  Acute blood loss anemia - H and H stable at 8.4 and 26.1 5. Urinary retention-BPH. Per Dr. Alinda Money, foley to remain and patient will follow up with Dr. Louis Meckel after discharge. Do NOT remove foley. Continue Doxazosin 4 mg bid and Flomax 0.4 mg daily.  6. On Lasix 80 mg daily for volume overload 7. DM-CBGs 86/165/154. On Insulin. Pre op HGA1C 7.1. 8. Lower sternal wound with drainage. No need for antibiotic at this time but will continue to monitor. Dressing changes as ordered. 9. Remove EPW 10. Regarding eventual disposition, patient and wife discussing if should go to SNF when ready for discharge.  ZIMMERMAN,[name] 08/11/2016,7:36 AM   Patient remains slow mobilizing  Likely will need snf Cr stable 4.3  Foley in place  I have seen and examined [name] and agree with the above assessment  and plan.  [name] MD Bee[place] [phone] Office [phone] 08/11/2016 6:30 PM

## 2016-08-11 NOTE — Progress Notes (Signed)
Assessment:  1 CKD 4/5 2 Nonoliguric AKI "plateauing" 3 Urinary retention 4 s/p CABG 5 Hypokalemia  Plan: Supportive  Subjective: Interval History No change  Objective: Vital signs in last 24 hours: Temp:  [97.9 F (36.6 C)-98.7 F (37.1 C)] 98.6 F (37 C) (06/28 0435) Pulse Rate:  [63-77] 77 (06/28 0435) Resp:  [14-19] 19 (06/28 0435) BP: (141-163)/(59-72) 159/60 (06/28 0435) SpO2:  [93 %-100 %] 97 % (06/28 0435) Weight:  [97.5 kg (214 lb 15.2 oz)] 97.5 kg (214 lb 15.2 oz) (06/28 0435) Weight change: 7.6 kg (16 lb 12.1 oz)  Intake/Output from previous day: 06/27 0701 - 06/28 0700 In: 300 [P.O.:300] Out: 1775 [Urine:1775] Intake/Output this shift: No intake/output data recorded.  General appearance: alert and cooperative, dysarthric speech Resp: sl dim BS at bases Chest wall: sternotomy Cardio: regular rate and rhythm, S1, S2 normal, no murmur, click, rub or gallop  Lab Results:  Recent Labs  08/10/16 0423 08/11/16 0359  WBC 7.2 6.6  HGB 8.2* 8.4*  HCT 25.5* 26.1*  PLT 194 211   BMET:  Recent Labs  08/10/16 0423 08/11/16 0359  NA 137 135  K 3.0* 3.9  CL 106 105  CO2 20* 20*  GLUCOSE 122* 148*  BUN 64* 65*  CREATININE 4.29* 4.32*  CALCIUM 8.4* 8.6*   No results for input(s): PTH in the last 72 hours. Iron Studies: No results for input(s): IRON, TIBC, TRANSFERRIN, FERRITIN in the last 72 hours. Studies/Results: US Renal Port  Result Date: 08/09/2016 CLINICAL DATA:  71 year old presenting with acute renal insufficiency superimposed upon chronic kidney disease. Current history of hypertension, diabetes. CABG 08/05/2016. EXAM: RENAL / URINARY TRACT ULTRASOUND COMPLETE COMPARISON:  Visualized kidneys on MRI lumbar spine 04/08/2009. FINDINGS: Right Kidney: Length: Approximately 12.5 cm. Echogenic parenchyma. No hydronephrosis. Approximate 1.0 x 1.0 x 1.1 cm simple cyst arising from the upper pole. Approximate 1.3 x 1.4 x 1.1 cm simple cyst arising from  the mid to upper pole. Approximate 1.5 x 1.3 x 1.6 cm cyst arising from the mid kidney. No solid renal masses. Left Kidney: Length: Approximately 8.9 cm. Markedly echogenic parenchyma with severe diffuse cortical thinning. No hydronephrosis. Approximate 2.0 x 1.8 x 1.7 cm cyst arising from the upper pole as noted on the prior MRI. No solid renal masses. Bladder: Decompressed by Foley catheter. IMPRESSION: 1. No evidence of urinary tract obstruction. 2. Atrophic left kidney. Echogenic parenchyma involving both kidneys indicating chronic medical renal disease. 3. Benign bilateral renal cysts.  No solid renal masses. Electronically Signed   By: Evangeline Dakin M.D.   On: 08/09/2016 15:39    Scheduled: . allopurinol  100 mg Oral Daily  . amLODipine  10 mg Oral Daily  . carvedilol  25 mg Oral BID WC  . doxazosin  4 mg Oral BID  . enoxaparin (LOVENOX) injection  30 mg Subcutaneous Q24H  . furosemide  80 mg Oral Daily  . insulin aspart  0-24 Units Subcutaneous TID AC & HS  . insulin aspart  3 Units Subcutaneous TID WC  . isosorbide-hydrALAZINE  1 tablet Oral BID  . levothyroxine  100 mcg Oral QAC breakfast  . lubiprostone  24 mcg Oral BID  . pantoprazole  40 mg Oral QAC breakfast  . rosuvastatin  10 mg Oral QPM  . sodium chloride flush  3 mL Intravenous Q12H  . tamsulosin  0.4 mg Oral Daily     LOS: 16 days   Hayly Litsey C 08/11/2016,8:54 AM

## 2016-08-11 NOTE — Progress Notes (Signed)
Physical Therapy Treatment Patient Details Name: [name] MRN: [id number] DOB: 08/04/1945 Today's Date: 08/11/2016    History of Present Illness 71 year old male w/ acute on chronic heart failure, c/b CRI stage IV admitted on 07/25/16 for SOB and chest pain.  Dx with NSTEMI and severe multi vessel CAD per L heart cath 07/28/16.  s/p CABG x 3 08/05/16.  PMH positive for HTN, DM, CKD, and lumbar fusion.      PT Comments    Pt making slow, steady progress. Pt continues to fatigue quickly with gait.   Follow Up Recommendations  CIR     Equipment Recommendations  Rolling walker with 5" wheels    Recommendations for Other Services       Precautions / Restrictions Precautions Precautions: Fall;Sternal    Mobility  Bed Mobility Overal bed mobility: Needs Assistance Bed Mobility: Sidelying to Sit   Sidelying to sit: Min assist       General bed mobility comments: Assist to elevate trunk into sitting  Transfers Overall transfer level: Needs assistance Equipment used: Rolling walker (2 wheeled) Transfers: Sit to/from Stand Sit to Stand: Min assist;From elevated surface         General transfer comment: Assist to bring hips up. Had pt place hands on knees to come up.  Ambulation/Gait Ambulation/Gait assistance: Min assist;Mod assist Ambulation Distance (Feet): 100 Feet Assistive device: Rolling walker (2 wheeled) Gait Pattern/deviations: Decreased stance time - right;Trunk flexed;Decreased step length - right Gait velocity: decr Gait velocity interpretation: <1.8 ft/sec, indicative of risk for recurrent falls General Gait Details: Assist for support and balance. Pt with rt tending to give way. Pt fatigues and required 2 standing rest breaks.   Stairs            Wheelchair Mobility    Modified Rankin (Stroke Patients Only)       Balance Overall balance assessment: Needs assistance Sitting-balance support: No upper extremity supported;Feet  supported Sitting balance-Leahy Scale: Good     Standing balance support: Bilateral upper extremity supported Standing balance-Leahy Scale: Poor Standing balance comment: walker and min assist for static standing                            Cognition Arousal/Alertness: Awake/alert Behavior During Therapy: WFL for tasks assessed/performed Overall Cognitive Status: Within Functional Limits for tasks assessed                                        Exercises      General Comments        Pertinent Vitals/Pain Pain Assessment: No/denies pain    Home Living                      Prior Function            PT Goals (current goals can now be found in the care plan section) Progress towards PT goals: Progressing toward goals    Frequency    Min 3X/week      PT Plan Current plan remains appropriate    Co-evaluation              AM-PAC PT "6 Clicks" Daily Activity  Outcome Measure  Difficulty turning over in bed (including adjusting bedclothes, sheets and blankets)?: A Little Difficulty moving from lying on back to sitting on the side  of the bed? : Total Difficulty sitting down on and standing up from a chair with arms (e.g., wheelchair, bedside commode, etc,.)?: Total Help needed moving to and from a bed to chair (including a wheelchair)?: A Little Help needed walking in hospital room?: A Lot Help needed climbing 3-5 steps with a railing? : Total 6 Click Score: 11    End of Session Equipment Utilized During Treatment: Gait belt Activity Tolerance: Patient limited by fatigue Patient left: in bed;with call bell/phone within reach Nurse Communication: Mobility status PT Visit Diagnosis: Muscle weakness (generalized) (M62.81);Difficulty in walking, not elsewhere classified (R26.2)     Time: 1120-1140 PT Time Calculation (min) (ACUTE ONLY): 20 min  Charges:  $Gait Training: 8-22 mins                    G Codes:        [facility] PT [place] 08/11/2016, 1:41 PM

## 2016-08-11 NOTE — Care Management Important Message (Signed)
Important Message  Patient Details  Name: [name] MRN: [id number] Date of Birth: Jul 29, 1945   Medicare Important Message Given:  Yes    Nathen May 08/11/2016, 10:04 AM

## 2016-08-11 NOTE — Progress Notes (Signed)
Removed Pt wires at 1222 with no resistance and patient tolerated procedure well.  BP 160/64 HR 76 NSR.  Pt. Placed on bedrest with VS q15 per 1hr. Dressing changed as well

## 2016-08-11 NOTE — Progress Notes (Signed)
OT Cancellation Note  Patient Details Name: [name] MRN: [id number] DOB: 01/24/1946   Cancelled Treatment:    Reason Eval/Treat Not Completed: Fatigue/lethargy limiting ability to participate. Pt politely declining OT today. Reports not feeling well. Just completed walk with cardiac rehab. Will follow.  [name] 08/11/2016, 2:48 PM  [phone]

## 2016-08-11 NOTE — Progress Notes (Signed)
CARDIAC REHAB PHASE I   PRE:  Rate/Rhythm: 73 SR    BP: sitting 156/59    SaO2: 97 RA  MODE:  Ambulation: 20 ft   POST:  Rate/Rhythm: 77 SR    BP: sitting 172/71     SaO2: 97 RA  Pt in bed, sts "I just don't feel good". Cannot pinpoint one specific complaint.  Denies pain. Willing to try to walk. Stood with min assist x2, gait belt. Used RW with slow gait. Sat in hall after 20 ft and pushed back to room. BP elevated but otherwise stable. Pt left in recliner. Will f/u as x2.  [place], ACSM 08/11/2016 2:50 PM

## 2016-08-11 NOTE — Progress Notes (Signed)
Pt. Wants to wait 30 mins post pain meds before removing wires and changing dressing.

## 2016-08-12 DIAGNOSIS — Z951 Presence of aortocoronary bypass graft: Secondary | ICD-10-CM

## 2016-08-12 DIAGNOSIS — D62 Acute posthemorrhagic anemia: Secondary | ICD-10-CM

## 2016-08-12 DIAGNOSIS — I110 Hypertensive heart disease with heart failure: Secondary | ICD-10-CM

## 2016-08-12 DIAGNOSIS — E119 Type 2 diabetes mellitus without complications: Secondary | ICD-10-CM

## 2016-08-12 DIAGNOSIS — I5042 Chronic combined systolic (congestive) and diastolic (congestive) heart failure: Secondary | ICD-10-CM

## 2016-08-12 LAB — GLUCOSE, CAPILLARY
GLUCOSE-CAPILLARY: 147 mg/dL — AB (ref 65–99)
Glucose-Capillary: 200 mg/dL — ABNORMAL HIGH (ref 65–99)
Glucose-Capillary: 85 mg/dL (ref 65–99)

## 2016-08-12 LAB — BASIC METABOLIC PANEL
Anion gap: 9 (ref 5–15)
BUN: 63 mg/dL — ABNORMAL HIGH (ref 6–20)
CALCIUM: 8.6 mg/dL — AB (ref 8.9–10.3)
CHLORIDE: 107 mmol/L (ref 101–111)
CO2: 20 mmol/L — ABNORMAL LOW (ref 22–32)
CREATININE: 4.14 mg/dL — AB (ref 0.61–1.24)
GFR calc non Af Amer: 13 mL/min — ABNORMAL LOW (ref >60)
GFR, EST AFRICAN AMERICAN: 15 mL/min — AB (ref >60)
Glucose, Bld: 136 mg/dL — ABNORMAL HIGH (ref 65–99)
Potassium: 3.5 mmol/L (ref 3.5–5.1)
SODIUM: 136 mmol/L (ref 135–145)

## 2016-08-12 MED ORDER — POTASSIUM CHLORIDE CRYS ER 20 MEQ PO TBCR: 40.0000 meq | EXTENDED_RELEASE_TABLET | Freq: Once | ORAL | Status: DC

## 2016-08-12 MED ORDER — POTASSIUM CHLORIDE CRYS ER 20 MEQ PO TBCR
20.0000 meq | EXTENDED_RELEASE_TABLET | Freq: Once | ORAL | Status: AC
  Administered 2016-08-12: 20 meq via ORAL
  Filled 2016-08-12: qty 1

## 2016-08-12 MED ORDER — ASPIRIN 325 MG PO TABS
325.0000 mg | ORAL_TABLET | Freq: Every day | ORAL | Status: DC
  Administered 2016-08-13 – 2016-08-17 (×5): 325 mg via ORAL
  Filled 2016-08-12 (×5): qty 1

## 2016-08-12 NOTE — Progress Notes (Signed)
CARDIAC REHAB PHASE I   PRE:  Rate/Rhythm: 71 SR  BP:  Supine:   Sitting: 157/58  Standing:    SaO2: 98%RA  MODE:  Ambulation: 100 ft   POST:  Rate/Rhythm: 86 SR  BP:  Supine:   Sitting: 160/69  Standing:    SaO2: 100%RA 0909-0930 Pt walked 100 ft on RA with gait belt use and rolling walker and one asst. One asst to follow with recliner. Pt motivated and wanted to walk the whole circle but became very tired and we made him sit down. Rolled pt back to room in recliner. Only walked 20 ft with [place] yesterday so much improved with distance. Sats good on RA.   Graylon Good, RN BSN  08/12/2016 9:27 AM

## 2016-08-12 NOTE — Progress Notes (Addendum)
      [address]       [place] [address]             [phone]        7 Days Post-Op Procedure(s) (LRB): CORONARY ARTERY BYPASS GRAFTING (CABG) x 3, LIMA to LAD, SVG to DIAGONAL, SVG to OM1, SVG to DISTAL RCA, USING LEFT MAMMARY ARTERY AND RIGHT GREATER SAPHENOUS VEIN HARVESTED ENDOSCOPICALLY (N/A) TRANSESOPHAGEAL ECHOCARDIOGRAM (TEE) (N/A)  Subjective: Patient without specific complaints this am.  Objective: Vital signs in last 24 hours: Temp:  [98.2 F (36.8 C)-99.1 F (37.3 C)] 99.1 F (37.3 C) (06/29 0500) Pulse Rate:  [70-75] 75 (06/29 0500) Cardiac Rhythm: Normal sinus rhythm (06/29 0700) Resp:  [18-20] 18 (06/29 0500) BP: (142-172)/(56-71) 142/56 (06/29 0500) SpO2:  [92 %-99 %] 92 % (06/29 0500) Weight:  [96.6 kg (212 lb 14.4 oz)] 96.6 kg (212 lb 14.4 oz) (06/29 0500)  Pre op weight 93.2 kg Current Weight  08/12/16 96.6 kg (212 lb 14.4 oz)      Intake/Output from previous day: 06/28 0701 - 06/29 0700 In: 480 [P.O.:480] Out: 700 [Urine:700]   Physical Exam:  Cardiovascular: RRR Pulmonary: Diminished at bases Abdomen: Soft, non tender, bowel sounds present. Extremities: Mild bilateral lower extremity edema. Wounds: LE wounds are clean and dry.  No erythema or signs of infection. Lower sternal wound has drainage.  Lab Results: CBC:  Recent Labs  08/10/16 0423 08/11/16 0359  WBC 7.2 6.6  HGB 8.2* 8.4*  HCT 25.5* 26.1*  PLT 194 211   BMET:   Recent Labs  08/11/16 0359 08/12/16 0240  NA 135 136  K 3.9 3.5  CL 105 107  CO2 20* 20*  GLUCOSE 148* 136*  BUN 65* 63*  CREATININE 4.32* 4.14*  CALCIUM 8.6* 8.6*    PT/INR:  Lab Results  Component Value Date   INR 1.27 08/05/2016   INR 1.12 08/05/2016   INR 1.09 07/28/2016   ABG:  INR: Will add last result for INR, ABG once components are confirmed Will add last 4 CBG results once components are confirmed  Assessment/Plan:  1. CV - SBP in the 150's. On Norvasc 10  mg daily, Coreg 25 mg bid, and Bidil 20/37.5 mg bid. Not on ec asa so will start. 2.  Pulmonary - On room air. Encourage incentive spirometer.  3. AKI-creatinine slightly decreased to 4.14 this am. Nephrology following. 4.  Acute blood loss anemia - H and H stable at 8.4 and 26.1 5. Urinary retention-BPH. Per Dr. Alinda Money, foley to remain and patient will follow up with Dr. Louis Meckel after discharge. Do NOT remove foley. Continue Doxazosin 4 mg bid and Flomax 0.4 mg daily.  6. On Lasix 80 mg daily for volume overload 7. DM-CBGs 158/183/147. On Insulin. Pre op HGA1C 7.1. 8. Lower sternal wound with scant drainage. No need for antibiotic at this time but will continue to monitor. Dressing changes as ordered. 9. Gently supplement potassium 10. Will see if candidate for CIR;if not, to SNF.  ZIMMERMAN,[name] 08/12/2016,7:57 AM   Slow to progress Rehab in next several days I have seen and examined [name] and agree with the above assessment  and plan.  Jeff Isaac MD Beeper [phone] Office [phone] 08/12/2016 6:22 PM

## 2016-08-12 NOTE — Consult Note (Signed)
Physical Medicine and Rehabilitation Consult   Reason for Consult: Debility due to multiple medical issues Referring Physician: Dr. Servando Snare.    HPI: [name] is a 71 y.o. male with history of DM, CKD stage 4/5, BPH, R-THR X 3 with RLE weakness who was admitted 07/25/16 from OSH with chest pain, difficulty voiding and progressive dyspnea. 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  He had recurrent chest pain as well as runs of NSVT likely due to ischemia. 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 Servando Snare. Post op has had had issues with AKI, weakness,  difficulty with voiding and multiple episodes of urinary retention. Foley placed by Dr. Alinda Money with recommendations for voiding trial on outpatient basis. Therapy ongoing and CIR recommended due to debility.   Review of Systems  Constitutional: Positive for malaise/fatigue.  HENT: Negative for hearing loss and tinnitus.   Eyes: Negative for blurred vision and double vision.  Respiratory: Positive for cough and shortness of breath. Negative for sputum production and wheezing.   Cardiovascular: Positive for chest pain (chest soreness). Negative for palpitations.  Gastrointestinal: Positive for abdominal pain. Negative for constipation, heartburn and nausea.  Genitourinary: Negative for dysuria and urgency.  Musculoskeletal: Negative for back pain and myalgias.  Skin: Negative for rash.  Neurological: Positive for weakness. Negative for dizziness, sensory change, speech change, seizures and headaches.  Psychiatric/Behavioral: Positive for memory loss.  All other systems reviewed and are negative.     Past Medical History:  Diagnosis Date  . Carotid artery occlusion   . Chronic kidney disease   . Diabetes mellitus without complication ([place])   . Hypertension      Past Surgical History:  Procedure Laterality Date  . CORONARY  ARTERY BYPASS GRAFT N/A 08/05/2016   Procedure: CORONARY ARTERY BYPASS GRAFTING (CABG) x 3, LIMA to LAD, SVG to DIAGONAL, SVG to OM1, SVG to DISTAL RCA, USING LEFT MAMMARY ARTERY AND RIGHT GREATER SAPHENOUS VEIN HARVESTED ENDOSCOPICALLY;  Surgeon: [name], MD;  Location: [facility];  Service: Open Heart Surgery;  Laterality: N/A;  . LEFT HEART CATH AND CORONARY ANGIOGRAPHY N/A 07/28/2016   Procedure: Left Heart Cath and Coronary Angiography;  Surgeon: Jettie Booze, MD;  Location: [facility];  Service: Cardiovascular;  Laterality: N/A;  . LUMBAR FUSION  2005  . TEE WITHOUT CARDIOVERSION N/A 08/05/2016   Procedure: TRANSESOPHAGEAL ECHOCARDIOGRAM (TEE);  Surgeon: [name], MD;  Location: [facility];  Service: Open Heart Surgery;  Laterality: N/A;    Family History  Problem Relation Age of Onset  . Heart disease Mother   . Hypertension Mother   . Hypertension Father     Social History:  Married.wife at home and can assist as needed Retired Nature conservation officer.  Independent with cane due to RLE weakness. He reports that he has quit smoking--1989. He has never used smokeless tobacco. He reports that he does not drink alcohol or use drugs.     Allergies  Allergen Reactions  . No Known Allergies     Medications Prior to Admission  Medication Sig Dispense Refill  . allopurinol (ZYLOPRIM) 100 MG tablet Take 100 mg by mouth daily.  3  . AMITIZA 24 MCG capsule Take 24 mcg by mouth 2 (two) times daily.  5  . amLODipine (NORVASC) 10 MG tablet Take 10 mg by mouth daily.  2  .  betamethasone valerate (VALISONE) 0.1 % cream Apply 1 application topically daily.   99  . BIDIL 20-37.5 MG tablet Take 1 tablet by mouth 2 (two) times daily.  11  . carvedilol (COREG) 25 MG tablet Take 25 mg by mouth 2 (two) times daily.  3  . CVS VITAMIN D 2000 units CAPS Take 1 capsule by mouth daily.  5  . doxazosin (CARDURA) 4 MG tablet Take 4 mg by mouth 2 (two) times daily.  2  . fluticasone  (FLONASE) 50 MCG/ACT nasal spray USE 2 SPRAYS IN EACH NOSTRIL AT BEDTIME  9  . furosemide (LASIX) 80 MG tablet Take 80 mg by mouth daily.  2  . HUMULIN 70/30 (70-30) 100 UNIT/ML injection Inject 5-10 Units into the skin 2 (two) times daily with a meal.   9  . levothyroxine (SYNTHROID, LEVOTHROID) 100 MCG tablet Take 100 mcg by mouth daily.    . rosuvastatin (CRESTOR) 10 MG tablet Take 10 mg by mouth every evening.  3  . sodium bicarbonate 650 MG tablet Take 650 mg by mouth 2 (two) times daily.  4  . tamsulosin (FLOMAX) 0.4 MG CAPS capsule Take 0.8 mg by mouth every evening.  9    Home: Home Living Family/patient expects to be discharged to:: Private residence Living Arrangements: Spouse/significant other, Children Available Help at Discharge: Family, Available 24 hours/day Type of Home: Apartment Home Access: Level entry (3 steps in back no rails) Home Layout: One level Home Equipment: None Additional Comments: Enjoys going to church  Functional History: Prior Function Level of Independence: Independent Functional Status:  Mobility: Bed Mobility Overal bed mobility: Needs Assistance Bed Mobility: Sidelying to Sit Sidelying to sit: Min assist Supine to sit: Modified independent (Device/Increase time), HOB elevated Sit to supine: Modified independent (Device/Increase time) Sit to sidelying: Mod assist General bed mobility comments: Assist to elevate trunk into sitting Transfers Overall transfer level: Needs assistance Equipment used: Rolling walker (2 wheeled) Transfers: Sit to/from Stand Sit to Stand: Min assist, From elevated surface General transfer comment: Assist to bring hips up. Had pt place hands on knees to come up. Ambulation/Gait Ambulation/Gait assistance: Min assist, Mod assist Ambulation Distance (Feet): 100 Feet Assistive device: Rolling walker (2 wheeled) Gait Pattern/deviations: Decreased stance time - right, Trunk flexed, Decreased step length -  right General Gait Details: Assist for support and balance. Pt with rt tending to give way. Pt fatigues and required 2 standing rest breaks. Gait velocity: decr Gait velocity interpretation: <1.8 ft/sec, indicative of risk for recurrent falls    ADL:    Cognition: Cognition Overall Cognitive Status: Within Functional Limits for tasks assessed Orientation Level: Oriented X4 Cognition Arousal/Alertness: Awake/alert Behavior During Therapy: WFL for tasks assessed/performed Overall Cognitive Status: Within Functional Limits for tasks assessed   Blood pressure (!) 142/56, pulse 75, temperature 99.1 F (37.3 C), temperature source Oral, resp. rate 18, height 6\' 1"  (1.854 m), weight 96.6 kg (212 lb 14.4 oz), SpO2 92 %. Physical Exam  Nursing note and vitals reviewed. Constitutional: He is oriented to person, place, and time. He appears well-developed and well-nourished.  HENT:  Head: Normocephalic and atraumatic.  Mouth/Throat: Oropharynx is clear and moist.  Eyes: Conjunctivae and EOM are normal. Pupils are equal, round, and reactive to light.  Neck: Normal range of motion. Neck supple.  Cardiovascular: Normal rate and regular rhythm.   Respiratory: Effort normal. No respiratory distress. He has decreased breath sounds in the right lower field and the left lower field. He has  no wheezes. He exhibits tenderness.  GI: Soft. Bowel sounds are normal. He exhibits distension. There is no tenderness.  Musculoskeletal: He exhibits no edema or tenderness.  Neurological: He is alert and oriented to person, place, and time.  Motor: 4+/5 throughout  Skin: Skin is warm and dry. No erythema.  Incision c/d/i  Psychiatric: He has a normal mood and affect. His behavior is normal.    Results for orders placed or performed during the hospital encounter of 07/25/16 (from the past 24 hour(s))  Glucose, capillary     Status: Abnormal   Collection Time: 08/11/16 11:37 AM  Result Value Ref Range    Glucose-Capillary 178 (H) 65 - 99 mg/dL   Comment 1 Notify RN   Glucose, capillary     Status: Abnormal   Collection Time: 08/11/16  4:22 PM  Result Value Ref Range   Glucose-Capillary 158 (H) 65 - 99 mg/dL   Comment 1 Notify RN   Glucose, capillary     Status: Abnormal   Collection Time: 08/11/16  8:59 PM  Result Value Ref Range   Glucose-Capillary 183 (H) 65 - 99 mg/dL  Basic metabolic panel     Status: Abnormal   Collection Time: 08/12/16  2:40 AM  Result Value Ref Range   Sodium 136 135 - 145 mmol/L   Potassium 3.5 3.5 - 5.1 mmol/L   Chloride 107 101 - 111 mmol/L   CO2 20 (L) 22 - 32 mmol/L   Glucose, Bld 136 (H) 65 - 99 mg/dL   BUN 63 (H) 6 - 20 mg/dL   Creatinine, Ser 4.14 (H) 0.61 - 1.24 mg/dL   Calcium 8.6 (L) 8.9 - 10.3 mg/dL   GFR calc non Af Amer 13 (L) >60 mL/min   GFR calc Af Amer 15 (L) >60 mL/min   Anion gap 9 5 - 15  Glucose, capillary     Status: Abnormal   Collection Time: 08/12/16  6:14 AM  Result Value Ref Range   Glucose-Capillary 147 (H) 65 - 99 mg/dL   Comment 1 Notify RN    Dg Chest 2 View  Result Date: 08/11/2016 CLINICAL DATA:  Recent CABG with weakness shortness of breath and cough EXAM: CHEST  2 VIEW COMPARISON:  08/09/2016 FINDINGS: Elevated right diaphragm with linear atelectasis at the right base. Air filled bowel in the upper abdomen as before. Post sternotomy changes. Trace pleural effusion. Stable slightly enlarged cardiomediastinal silhouette with atherosclerosis. No definite pneumothorax is seen. IMPRESSION: 1. Post sternotomy changes. 2. Elevated right diaphragm with subsegmental atelectasis at both bases. Tiny pleural effusions 3. Stable degree of mild cardiomegaly Electronically Signed   By: Donavan Foil M.D.   On: 08/11/2016 22:15    Assessment/Plan: Diagnosis: Debility  Labs independently reviewed.  Records reviewed and summated above.  1. Does the need for close, 24 hr/day medical supervision in concert with the patient's rehab needs  make it unreasonable for this patient to be served in a less intensive setting? Yes  2. Co-Morbidities requiring supervision/potential complications:  DM (Monitor in accordance with exercise and adjust meds as necessary), CKD stage 4, BPH with retention (foley per urology), R-THR X 3, CHF (Monitor in accordance with increased physical activity and avoid UE resistance excercises), AKI (avoid nephrotoxic meds), HTN (monitor and provide prns in accordance with increased physical exertion and pain), ABLA (transfuse if necessary to ensure appropriate perfusion for increased activity tolerance) 3. Due to safety, skin/wound care, disease management and patient education, does the patient require 24  hr/day rehab nursing? Yes 4. Does the patient require coordinated care of a physician, rehab nurse, PT (1-2 hrs/day, 5 days/week) and OT (1-2 hrs/day, 5 days/week) to address physical and functional deficits in the context of the above medical diagnosis(es)? Yes Addressing deficits in the following areas: balance, endurance, locomotion, strength, transferring, bowel/bladder control, bathing, dressing, toileting and psychosocial support 5. Can the patient actively participate in an intensive therapy program of at least 3 hrs of therapy per day at least 5 days per week? Yes 6. The potential for patient to make measurable gains while on inpatient rehab is excellent 7. Anticipated functional outcomes upon discharge from inpatient rehab are modified independent and supervision  with PT, modified independent and supervision with OT, n/a with SLP. 8. Estimated rehab length of stay to reach the above functional goals is: 6-10 days. 9. Anticipated D/C setting: Home 10. Anticipated post D/C treatments: HH therapy and Home excercise program 11. Overall Rehab/Functional Prognosis: good  RECOMMENDATIONS: This patient's condition is appropriate for continued rehabilitative care in the following setting: Pt appears to be making  good functional gains. Will follow and consider CIR if persistent deficits Patient has agreed to participate in recommended program. Yes Note that insurance prior authorization may be required for reimbursement for recommended care.  Comment: Rehab Admissions Coordinator to follow up.  Delice Lesch, MD, [address] [place] 08/12/2016

## 2016-08-12 NOTE — Progress Notes (Addendum)
Inpatient Rehabilitation  Please see IP Rehab MD consult for full details.  Will plan to follow along for progress with therapies given that IP Rehab MD anticipates patient may make good functional gains.  Additionally, we have no beds available over the weekend.  If the patient remains in house plan to follow up on Monday.  Please call with questions.  Carmelia Roller., CCC/SLP Admission Coordinator  [place]  Cell [phone]

## 2016-08-12 NOTE — Discharge Summary (Signed)
Physician Discharge Summary       [address]       [place] [address]             [phone]    Patient ID: [name] MRN: [id number] DOB/AGE: 72-Jun-1947 72 y.o.  Admit date: 07/25/2016 Discharge date: 08/17/2016  Admission Diagnoses: 1. NSTEMI (non-ST elevated myocardial infarction) ([facility]) 2. Coronary artery disease  Active Diagnoses:  1. Essential hypertension 2. CKD (chronic kidney disease) stage 4, GFR 15-29 ml/min (HCC) 3. Diabetes mellitus, insulin-dependent (IDDM or type I) ([facility]) 4. Dyslipidemia 5.  Hypothyroidism 6. CHF (congestive heart failure) ([facility]) 7. 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 Acute respiratory failure with hypoxia (HCC) 9. Pulmonary edema 10. Anemia    Consults: nephrology and urology, pulmonary/CCM  Procedure (s):  Left Heart Cath and Coronary Angiography by Dr. Irish Lack on 07/28/2016:  Conclusion     LM lesion, 75 %stenosed.  Ost Cx lesion, 60 %stenosed.  Ost LAD lesion, 60 %stenosed.  Mid LAD lesion, 90 %stenosed.  Ost 2nd Diag to 2nd Diag lesion, 95 %stenosed.  Mid Cx to Dist Cx lesion, 25 %stenosed.  Prox RCA to Mid RCA lesion, 100 %stenosed.  LV end diastolic pressure is moderately elevated.  There is no aortic valve stenosis.  No ventriculogram done to minimize contrast exposure.   Severe multivessel CAD.  Plan for cardiac surgery consult.  Continue diuresis as renal function allows.       MEDIAN STERNOTOMY for CABG x 4  (LIMA to LAD,SVG to DIAGONAL, SVG to OM1, SVG to DISTAL RCA) with EVH from right GREATER SAPHENOUS VEIN, exploration but no harvesting of left greater saphenous vein by Dr. Servando Snare on 08/05/2016.  History of Presenting Illness: This is a 71 year old African American male who presented 5 days ago with respiratory failure at church service Saturday night. He said he felt poorly and became short of breath. He presented to [facility] with   progressive dyspnea, ultimately brought in by EMS. He has required BIPAP. Labs significant for creatinine of 3.83 (GFR 19), Troponin of 0.26, BNP of 19,808, anemia with H/H 9.1/27.5, glucose of 261, no leukocytosis. CXR shows mild cardiomegaly, CHF/consolidation - suspect pneumonia with superimposed pulmonary edema/ARDS. EKG shows NSR at 76 (personally reviewed) with anterior and lateral ST depression. Topinin later increased to 6.14. He ruled in for a NSTEMI. Cardiac catheterization wasdone on 07/28/2016.  With recent contrast, he would not be ready for CABG in am;if renal function stabilized and fluid status optimized, consider CABG 6/22. Dr. Servando Snare discussed with the patient risks and options of surgery and with very poor renal function and decreased lv functio, surgery carries increased risk. Patient was agreeable to surgery. Pre operative carotid duplex US showed waveforms appear high in both the internal carotid arteries. Elevated velocities could be attributed to vessel tortuosity. May need additional testing to determine degree of stenosis. Patient underwent a CABG x 4 on 08/05/2016.  Brief Hospital Course:  The patient was extubated later the evening of surgery without difficulty. He remained afebrile and hemodynamically stable. Gordy Councilman, a line, chest tubes, and foley were removed early in the post operative course. Lopressor was started and titrated accordingly. He was volume over loaded and diuresed according to nephrology's recommendations (has CKD). He had anemia, which was related to blood loss from surgery and CKD.  His H and H did drop to 7.9 and 24.4 and he was symptomatic;therefore, he was given one unit of PRBC. His  last H and H was up to 8.4 and 25.3  He was weaned off the insulin drip.  The patient's glucose remained well controlled.The patient's HGA1C pre op was 6.6. He did develop urinary retention. He has a history of BPH and was already restarted on his Flomax and Doxazosin.  Urology was consulted as foley had to be reinserted. It was recommended foley remain and patient to follow up with urology office after discharge. The patient was felt surgically stable for transfer from the ICU to PCTU for further convalescence on 08/10/2016. He continues to progress with cardiac rehab and PT. He was ambulating on room air. He has been tolerating a diet and has had a bowel movement. Epicardial pacing wires were removed on 08/11/2016. Chest tube sutures will be removed later today 08/17/2016.The patient is very deconditioned and would benefit from SNF placement at discharge. The patient is felt surgically stable for discharge today.  We ask the facility to please do the following: 1. Please obtain vital signs at least one time daily 2.Please weigh the patient daily. If he or she continues to gain weight or develops lower extremity edema, contact the office at [phone]. 3. Ambulate patient at least three times daily and please use sternal precautions.  Latest Vital Signs: Blood pressure (!) 149/47, pulse 73, temperature 99.1 F (37.3 C), temperature source Oral, resp. rate 18, height 6\' 1"  (1.854 m), weight 91.2 kg (201 lb 1 oz), SpO2 96 %.  Physical Exam: Cardiovascular: RRR Pulmonary: Diminished at bases Abdomen: Soft, non tender, bowel sounds present. Extremities: Mild bilateral lower extremity edema. Wounds: LE wounds are clean and dry.  No erythema or signs of infection. Lower sternal wound has drainage.  Discharge Condition:Stable and discharged to SNF.  Recent laboratory studies:  Lab Results  Component Value Date   WBC 7.1 08/17/2016   HGB 8.4 (L) 08/17/2016   HCT 25.3 (L) 08/17/2016   MCV 87.5 08/17/2016   PLT 243 08/17/2016   Lab Results  Component Value Date   NA 136 08/17/2016   K 3.6 08/17/2016   CL 107 08/17/2016   CO2 20 (L) 08/17/2016   CREATININE 3.84 (H) 08/17/2016   GLUCOSE 133 (H) 08/17/2016    Diagnostic Studies: Dg Chest 2  View  Result Date: 08/11/2016 CLINICAL DATA:  Recent CABG with weakness shortness of breath and cough EXAM: CHEST  2 VIEW COMPARISON:  08/09/2016 FINDINGS: Elevated right diaphragm with linear atelectasis at the right base. Air filled bowel in the upper abdomen as before. Post sternotomy changes. Trace pleural effusion. Stable slightly enlarged cardiomediastinal silhouette with atherosclerosis. No definite pneumothorax is seen. IMPRESSION: 1. Post sternotomy changes. 2. Elevated right diaphragm with subsegmental atelectasis at both bases. Tiny pleural effusions 3. Stable degree of mild cardiomegaly Electronically Signed   By: Donavan Foil M.D.   On: 08/11/2016 22:15   US Renal Port  Result Date: 08/09/2016 CLINICAL DATA:  71 year old presenting with acute renal insufficiency superimposed upon chronic kidney disease. Current history of hypertension, diabetes. CABG 08/05/2016. EXAM: RENAL / URINARY TRACT ULTRASOUND COMPLETE COMPARISON:  Visualized kidneys on MRI lumbar spine 04/08/2009. FINDINGS: Right Kidney: Length: Approximately 12.5 cm. Echogenic parenchyma. No hydronephrosis. Approximate 1.0 x 1.0 x 1.1 cm simple cyst arising from the upper pole. Approximate 1.3 x 1.4 x 1.1 cm simple cyst arising from the mid to upper pole. Approximate 1.5 x 1.3 x 1.6 cm cyst arising from the mid kidney. No solid renal masses. Left Kidney: Length: Approximately 8.9 cm. Markedly echogenic  parenchyma with severe diffuse cortical thinning. No hydronephrosis. Approximate 2.0 x 1.8 x 1.7 cm cyst arising from the upper pole as noted on the prior MRI. No solid renal masses. Bladder: Decompressed by Foley catheter. IMPRESSION: 1. No evidence of urinary tract obstruction. 2. Atrophic left kidney. Echogenic parenchyma involving both kidneys indicating chronic medical renal disease. 3. Benign bilateral renal cysts.  No solid renal masses. Electronically Signed   By: Marcello Moores   Discharge Instructions    Amb Referral to Cardiac  Rehabilitation    Complete by:  As directed    Diagnosis:   CABG NSTEMI     CABG X ___:  4    Discharge Medications: Allergies as of 08/17/2016      Reactions   No Known Allergies       Medication List    TAKE these medications   acetaminophen 325 MG tablet Commonly known as:  TYLENOL Take 2 tablets (650 mg total) by mouth every 6 (six) hours as needed for mild pain.   allopurinol 100 MG tablet Commonly known as:  ZYLOPRIM Take 100 mg by mouth daily.   AMITIZA 24 MCG capsule Generic drug:  lubiprostone Take 24 mcg by mouth 2 (two) times daily.   amLODipine 10 MG tablet Commonly known as:  NORVASC Take 10 mg by mouth daily.   aspirin 325 MG tablet Take 1 tablet (325 mg total) by mouth daily.   betamethasone valerate 0.1 % cream Commonly known as:  VALISONE Apply 1 application topically daily.   BIDIL 20-37.5 MG tablet Generic drug:  isosorbide-hydrALAZINE Take 1 tablet by mouth 3 (three) times daily. What changed:  when to take this   carvedilol 25 MG tablet Commonly known as:  COREG Take 1.5 tablets (37.5 mg total) by mouth 2 (two) times daily. What changed:  how much to take   [employer] VITAMIN D 2000 units Caps Generic drug:  Cholecalciferol Take 1 capsule by mouth daily.   doxazosin 4 MG tablet Commonly known as:  CARDURA Take 4 mg by mouth 2 (two) times daily.   ferrous sulfate 325 (65 FE) MG tablet Take 1 tablet (325 mg total) by mouth daily with breakfast.   fluticasone 50 MCG/ACT nasal spray Commonly known as:  FLONASE USE 2 SPRAYS IN EACH NOSTRIL AT BEDTIME   folic acid 1 MG tablet Commonly known as:  FOLVITE Take 1 tablet (1 mg total) by mouth daily.   furosemide 40 MG tablet Commonly known as:  LASIX Take 1 tablet (40 mg total) by mouth daily. What changed:  medication strength  how much to take   HUMULIN 70/30 (70-30) 100 UNIT/ML injection Generic drug:  insulin NPH-regular Human Inject 5-10 Units into the skin 2 (two) times daily with  a meal.   levothyroxine 100 MCG tablet Commonly known as:  SYNTHROID, LEVOTHROID Take 100 mcg by mouth daily.   oxyCODONE 5 MG immediate release tablet Commonly known as:  Oxy IR/ROXICODONE Take 5 mg by mouth every 4-6 hours PRN severe pain   rosuvastatin 20 MG tablet Commonly known as:  CRESTOR Take 1 tablet (20 mg total) by mouth every evening. What changed:  medication strength  how much to take   sodium bicarbonate 650 MG tablet Take 650 mg by mouth 2 (two) times daily.   tamsulosin 0.4 MG Caps capsule Commonly known as:  FLOMAX Take 0.8 mg by mouth every evening.      The patient has been discharged on:   1.Beta Blocker:  Yes [ x  ]  No   [   ]                              If No, reason:  2.Ace Inhibitor/ARB: Yes [   ]                                     No  [  x  ]                                     If No, reason:CKD-last creatinine 3.84  3.Statin:   Yes [  x ]                  No  [   ]                  If No, reason:  4.Ecasa:  Yes  [ x  ]                  No   [   ]                  If No, reason:  Follow Up Appointments: Follow-up Information    Ardis Hughs, MD Follow up.   Specialty:  Urology Why:  Call for outpatient appointment for voiding trial in 1 week Contact information: [place] [address] [phone]        [name], MD Follow up on 09/22/2016.   Specialty:  Cardiothoracic Surgery Why:  PA/LAT CXR to be taken (at [facility] which is in the same building as Dr. Everrett Coombe office) on 09/22/2016 at 10:30 am;Appointment time is at 11:00 am Contact information: [place] [address] [phone]        Foye Spurling, MD. Call.   Specialty:  Internal Medicine Why:  Call for an appointment for further surveillance of HGA1C 5.9 (pre diabetes) Contact information: [address] [place]  [address] [phone]        Fran Lowes, MD. Call.   Specialty:  Nephrology Why:  Call for a for a follow up appointment for 1 week Contact information: [address] [place] [address] [phone]        SNF Follow up.   Why:  Please check BMET weekly and fax results to Dr. Lysbeth Penner office.          Signed: Tamyrah Burbage MPA-C 08/17/2016, 10:51 AM

## 2016-08-12 NOTE — Evaluation (Signed)
Occupational Therapy Evaluation Patient Details Name: [name] MRN: [id number] DOB: 05-08-1945 Today's Date: 08/12/2016    History of Present Illness 71 year old male w/ acute on chronic heart failure, c/b CRI stage IV admitted on 07/25/16 for SOB and chest pain.  Dx with NSTEMI and severe multi vessel CAD per L heart cath 07/28/16.  s/p CABG x 3 08/05/16.  PMH positive for HTN, DM, CKD, and lumbar fusion.     Clinical Impression   Pt is independent at baseline. Presents with generalized weakness, decreased activity tolerance and impaired balance. Pt requires cues for sternal precautions. Moderate assistance was required for sit to stand. He tolerated standing ADL for one activity before needing to sit. Pt will need post acute rehab prior to return home with his family. Will follow acutely.    Follow Up Recommendations  CIR    Equipment Recommendations  3 in 1 bedside commode    Recommendations for Other Services       Precautions / Restrictions Precautions Precautions: Fall;Sternal Precaution Comments: pt able to state one precaution, reeducated      Mobility Bed Mobility               General bed mobility comments: pt received in chair  Transfers Overall transfer level: Needs assistance Equipment used: Rolling walker (2 wheeled) Transfers: Sit to/from Stand Sit to Stand: Mod assist;From elevated surface         General transfer comment: cues for hands on knees, to scoot hips to edge and use of momentum, assist to rise    Balance Overall balance assessment: Needs assistance   Sitting balance-Leahy Scale: Good       Standing balance-Leahy Scale: Poor Standing balance comment: requires at least one hand to balance in static standing                           ADL either performed or assessed with clinical judgement   ADL Overall ADL's : Needs assistance/impaired Eating/Feeding: Independent;Sitting   Grooming: Oral care;Standing;Min guard    Upper Body Bathing: Minimal assistance;Sitting   Lower Body Bathing: Maximal assistance;Sit to/from stand   Upper Body Dressing : Set up;Sitting   Lower Body Dressing: Maximal assistance;Sit to/from stand   Toilet Transfer: Minimal assistance;RW;Ambulation;BSC   Toileting- Clothing Manipulation and Hygiene: Minimal assistance;Sit to/from stand       Functional mobility during ADLs: Minimal assistance;Rolling walker General ADL Comments: pt unable to cross foot over opposite knee, fatigues quickly with standing at sink     Vision Patient Visual Report: No change from baseline       Perception     Praxis      Pertinent Vitals/Pain Pain Assessment: No/denies pain     Hand Dominance Right   Extremity/Trunk Assessment Upper Extremity Assessment Upper Extremity Assessment: Generalized weakness (no formally assessed due to sternal precautions)   Lower Extremity Assessment Lower Extremity Assessment: Defer to PT evaluation   Cervical / Trunk Assessment Cervical / Trunk Assessment: Kyphotic   Communication Communication Communication: No difficulties   Cognition Arousal/Alertness: Awake/alert Behavior During Therapy: WFL for tasks assessed/performed Overall Cognitive Status: Within Functional Limits for tasks assessed                                 General Comments: somewhat slow response speed   General Comments       Exercises  Shoulder Instructions      Home Living Family/patient expects to be discharged to:: Private residence Living Arrangements: Spouse/significant other;Children Available Help at Discharge: Family;Available 24 hours/day Type of Home: Apartment Home Access: Level entry     Home Layout: One level     Bathroom Shower/Tub: Teacher, early years/pre: Standard     Home Equipment: None   Additional Comments: Enjoys going to church, sitting on his porch      Prior Functioning/Environment Level of  Independence: Independent                 OT Problem List: Decreased strength;Decreased activity tolerance;Impaired balance (sitting and/or standing);Decreased knowledge of use of DME or AE;Decreased knowledge of precautions;Cardiopulmonary status limiting activity      OT Treatment/Interventions: Self-care/ADL training;DME and/or AE instruction;Therapeutic activities;Patient/family education;Balance training;Energy conservation    OT Goals(Current goals can be found in the care plan section) Acute Rehab OT Goals Patient Stated Goal: to get stronger and return home.  OT Goal Formulation: With patient Time For Goal Achievement: 08/26/16 Potential to Achieve Goals: Good ADL Goals Pt Will Perform Grooming: with supervision;standing (3 activities) Pt Will Perform Lower Body Bathing: with supervision;with adaptive equipment;sit to/from stand Pt Will Perform Lower Body Dressing: with supervision;with adaptive equipment;sit to/from stand Pt Will Transfer to Toilet: with supervision;ambulating;bedside commode (over toilet) Pt Will Perform Toileting - Clothing Manipulation and hygiene: with supervision;sit to/from stand Additional ADL Goal #1: Pt will generalize sternal precautions in ADL and mobility. Additional ADL Goal #2: Pt will state at least 3 energy conservation strategies as instructed.  OT Frequency: Min 2X/week   Barriers to D/C:            Co-evaluation              AM-PAC PT "6 Clicks" Daily Activity     Outcome Measure Help from another person eating meals?: None Help from another person taking care of personal grooming?: A Little Help from another person toileting, which includes using toliet, bedpan, or urinal?: A Little Help from another person bathing (including washing, rinsing, drying)?: A Lot Help from another person to put on and taking off regular upper body clothing?: None Help from another person to put on and taking off regular lower body clothing?: A  Lot 6 Click Score: 18   End of Session Equipment Utilized During Treatment: Gait belt;Rolling walker  Activity Tolerance: Patient tolerated treatment well Patient left: in chair;with call bell/phone within reach  OT Visit Diagnosis: Unsteadiness on feet (R26.81);Muscle weakness (generalized) (M62.81)                Time: 2863-8177 OT Time Calculation (min): 19 min Charges:  OT General Charges $OT Visit: 1 Procedure OT Evaluation $OT Eval Moderate Complexity: 1 Procedure G-Codes:      [name] 08/12/2016, 9:13 AM  [phone]

## 2016-08-12 NOTE — Progress Notes (Signed)
Assessment:  1 CKD 4/5 2 Nonoliguric AKI, now improving 3 Urinary retention 4 s/p CABG 5 Hypokalemia  Plan: Supportive   Subjective: Interval History: Doing well  Objective: Vital signs in last 24 hours: Temp:  [98.2 F (36.8 C)-99.1 F (37.3 C)] 99.1 F (37.3 C) (06/29 0500) Pulse Rate:  [70-75] 75 (06/29 0500) Resp:  [18-20] 18 (06/29 0500) BP: (142-172)/(56-71) 142/56 (06/29 0500) SpO2:  [92 %-99 %] 92 % (06/29 0500) Weight:  [96.6 kg (212 lb 14.4 oz)] 96.6 kg (212 lb 14.4 oz) (06/29 0500) Weight change: -0.929 kg (-2 lb 0.8 oz)  Intake/Output from previous day: 06/28 0701 - 06/29 0700 In: 480 [P.O.:480] Out: 700 [Urine:700] Intake/Output this shift: No intake/output data recorded.  General appearance: alert and cooperative Head: Normocephalic, without obvious abnormality, atraumatic Chest wall: sternotomy Cardio: regular rate and rhythm, S1, S2 normal, no murmur, click, rub or gallop Extremities: edema 1+  Lab Results:  Recent Labs  08/10/16 0423 08/11/16 0359  WBC 7.2 6.6  HGB 8.2* 8.4*  HCT 25.5* 26.1*  PLT 194 211   BMET:  Recent Labs  08/11/16 0359 08/12/16 0240  NA 135 136  K 3.9 3.5  CL 105 107  CO2 20* 20*  GLUCOSE 148* 136*  BUN 65* 63*  CREATININE 4.32* 4.14*  CALCIUM 8.6* 8.6*   No results for input(s): PTH in the last 72 hours. Iron Studies: No results for input(s): IRON, TIBC, TRANSFERRIN, FERRITIN in the last 72 hours. Studies/Results: Dg Chest 2 View  Result Date: 08/11/2016 CLINICAL DATA:  Recent CABG with weakness shortness of breath and cough EXAM: CHEST  2 VIEW COMPARISON:  08/09/2016 FINDINGS: Elevated right diaphragm with linear atelectasis at the right base. Air filled bowel in the upper abdomen as before. Post sternotomy changes. Trace pleural effusion. Stable slightly enlarged cardiomediastinal silhouette with atherosclerosis. No definite pneumothorax is seen. IMPRESSION: 1. Post sternotomy changes. 2. Elevated right  diaphragm with subsegmental atelectasis at both bases. Tiny pleural effusions 3. Stable degree of mild cardiomegaly Electronically Signed   By: Donavan Foil M.D.   On: 08/11/2016 22:15    Scheduled: . allopurinol  100 mg Oral Daily  . amLODipine  10 mg Oral Daily  . carvedilol  25 mg Oral BID WC  . doxazosin  4 mg Oral BID  . enoxaparin (LOVENOX) injection  30 mg Subcutaneous Q24H  . furosemide  80 mg Oral Daily  . insulin aspart  0-24 Units Subcutaneous TID AC & HS  . insulin aspart  3 Units Subcutaneous TID WC  . isosorbide-hydrALAZINE  1 tablet Oral BID  . levothyroxine  100 mcg Oral QAC breakfast  . lubiprostone  24 mcg Oral BID  . pantoprazole  40 mg Oral QAC breakfast  . potassium chloride  20 mEq Oral Once  . rosuvastatin  10 mg Oral QPM  . sodium chloride flush  3 mL Intravenous Q12H  . tamsulosin  0.4 mg Oral Daily    LOS: 17 days   Larri Brewton C 08/12/2016,9:40 AM

## 2016-08-13 LAB — GLUCOSE, CAPILLARY
GLUCOSE-CAPILLARY: 164 mg/dL — AB (ref 65–99)
Glucose-Capillary: 166 mg/dL — ABNORMAL HIGH (ref 65–99)
Glucose-Capillary: 128 mg/dL — ABNORMAL HIGH (ref 65–99)
Glucose-Capillary: 116 mg/dL — ABNORMAL HIGH (ref 65–99)

## 2016-08-13 LAB — BASIC METABOLIC PANEL
Anion gap: 11 (ref 5–15)
BUN: 63 mg/dL — ABNORMAL HIGH (ref 6–20)
CHLORIDE: 106 mmol/L (ref 101–111)
CO2: 19 mmol/L — AB (ref 22–32)
CREATININE: 4.02 mg/dL — AB (ref 0.61–1.24)
Calcium: 8.8 mg/dL — ABNORMAL LOW (ref 8.9–10.3)
GFR calc non Af Amer: 14 mL/min — ABNORMAL LOW (ref >60)
GFR, EST AFRICAN AMERICAN: 16 mL/min — AB (ref >60)
Glucose, Bld: 184 mg/dL — ABNORMAL HIGH (ref 65–99)
Potassium: 3.7 mmol/L (ref 3.5–5.1)
Sodium: 136 mmol/L (ref 135–145)

## 2016-08-13 MED ORDER — ZOLPIDEM TARTRATE 5 MG PO TABS
5.0000 mg | ORAL_TABLET | Freq: Once | ORAL | Status: AC
  Administered 2016-08-13: 5 mg via ORAL
  Filled 2016-08-13: qty 1

## 2016-08-13 NOTE — Progress Notes (Signed)
Assessment:  1 CKD 4/5 2 Nonoliguric AKI, now improving 3 Urinary retention, foley 4 s/p CABG 5 Hypokalemia, resolved  Plan: He looks euvolemic so will  hold diuretic, but anticipate need to restart in future   Subjective: Interval History: none.  Objective: Vital signs in last 24 hours: Temp:  [97.9 F (36.6 C)-98.8 F (37.1 C)] 97.9 F (36.6 C) (06/30 0349) Pulse Rate:  [73-78] 77 (06/30 0349) Resp:  [18] 18 (06/30 0349) BP: (155-169)/(59-82) 169/59 (06/30 0349) SpO2:  [94 %-100 %] 99 % (06/30 0349) Weight:  [89.1 kg (196 lb 6.9 oz)] 89.1 kg (196 lb 6.9 oz) (06/30 0349) Weight change: -7.471 kg (-16 lb 7.5 oz)  Intake/Output from previous day: 06/29 0701 - 06/30 0700 In: 960 [P.O.:960] Out: 1100 [Urine:1100] Intake/Output this shift: No intake/output data recorded.  General appearance: alert and cooperative Resp: clear to auscultation bilaterally Cardio: regular rate and rhythm, S1, S2 normal, no murmur, click, rub or gallop Extremities: extremities normal, atraumatic, no cyanosis or edema  Lab Results:  Recent Labs  08/11/16 0359  WBC 6.6  HGB 8.4*  HCT 26.1*  PLT 211   BMET:  Recent Labs  08/12/16 0240 08/13/16 0148  NA 136 136  K 3.5 3.7  CL 107 106  CO2 20* 19*  GLUCOSE 136* 184*  BUN 63* 63*  CREATININE 4.14* 4.02*  CALCIUM 8.6* 8.8*   No results for input(s): PTH in the last 72 hours. Iron Studies: No results for input(s): IRON, TIBC, TRANSFERRIN, FERRITIN in the last 72 hours. Studies/Results: Dg Chest 2 View  Result Date: 08/11/2016 CLINICAL DATA:  Recent CABG with weakness shortness of breath and cough EXAM: CHEST  2 VIEW COMPARISON:  08/09/2016 FINDINGS: Elevated right diaphragm with linear atelectasis at the right base. Air filled bowel in the upper abdomen as before. Post sternotomy changes. Trace pleural effusion. Stable slightly enlarged cardiomediastinal silhouette with atherosclerosis. No definite pneumothorax is seen.  IMPRESSION: 1. Post sternotomy changes. 2. Elevated right diaphragm with subsegmental atelectasis at both bases. Tiny pleural effusions 3. Stable degree of mild cardiomegaly Electronically Signed   By: Donavan Foil M.D.   On: 08/11/2016 22:15    Scheduled: . allopurinol  100 mg Oral Daily  . amLODipine  10 mg Oral Daily  . aspirin  325 mg Oral Daily  . carvedilol  25 mg Oral BID WC  . doxazosin  4 mg Oral BID  . enoxaparin (LOVENOX) injection  30 mg Subcutaneous Q24H  . furosemide  80 mg Oral Daily  . insulin aspart  0-24 Units Subcutaneous TID AC & HS  . insulin aspart  3 Units Subcutaneous TID WC  . isosorbide-hydrALAZINE  1 tablet Oral BID  . levothyroxine  100 mcg Oral QAC breakfast  . lubiprostone  24 mcg Oral BID  . pantoprazole  40 mg Oral QAC breakfast  . rosuvastatin  10 mg Oral QPM  . sodium chloride flush  3 mL Intravenous Q12H  . tamsulosin  0.4 mg Oral Daily     LOS: 18 days   [name] C 08/13/2016,7:52 AM

## 2016-08-13 NOTE — Progress Notes (Addendum)
[name]       [place] [address]             [phone]      8 Days Post-Op Procedure(s) (LRB): CORONARY ARTERY BYPASS GRAFTING (CABG) x 3, LIMA to LAD, SVG to DIAGONAL, SVG to OM1, SVG to DISTAL RCA, USING LEFT MAMMARY ARTERY AND RIGHT GREATER SAPHENOUS VEIN HARVESTED ENDOSCOPICALLY (N/A) TRANSESOPHAGEAL ECHOCARDIOGRAM (TEE) (N/A) Subjective: Sleepy. States he is doing well.   Objective: Vital signs in last 24 hours: Temp:  [97.9 F (36.6 C)-98.8 F (37.1 C)] 97.9 F (36.6 C) (06/30 0349) Pulse Rate:  [73-78] 74 (06/30 0850) Cardiac Rhythm: Normal sinus rhythm (06/30 0700) Resp:  [18] 18 (06/30 0850) BP: (152-169)/(59-82) 152/61 (06/30 0850) SpO2:  [94 %-100 %] 98 % (06/30 0850) Weight:  [89.1 kg (196 lb 6.9 oz)] 89.1 kg (196 lb 6.9 oz) (06/30 0349)     Intake/Output from previous day: 06/29 0701 - 06/30 0700 In: 960 [P.O.:960] Out: 1100 [Urine:1100] Intake/Output this shift: Total I/O In: 240 [P.O.:240] Out: -   General appearance: alert, cooperative and no distress Heart: regular rate and rhythm, S1, S2 normal, no murmur, click, rub or gallop Lungs: clear to auscultation bilaterally Abdomen: soft, non-tender; bowel sounds normal; no masses,  no organomegaly Extremities: extremities normal, atraumatic, no cyanosis or edema Wound: clean. Tiny amount of clear drainage on distal aspect of sternal incision.   Lab Results:  Recent Labs  08/11/16 0359  WBC 6.6  HGB 8.4*  HCT 26.1*  PLT 211   BMET:  Recent Labs  08/12/16 0240 08/13/16 0148  NA 136 136  K 3.5 3.7  CL 107 106  CO2 20* 19*  GLUCOSE 136* 184*  BUN 63* 63*  CREATININE 4.14* 4.02*  CALCIUM 8.6* 8.8*    PT/INR: No results for input(s): LABPROT, INR in the last 72 hours. ABG    Component Value Date/Time   PHART 7.423 08/05/2016 2234   HCO3 20.2 08/05/2016 2234   TCO2 21 08/06/2016 1724   ACIDBASEDEF 3.0 (H) 08/05/2016 2234   O2SAT 93.0 08/05/2016 2234    CBG (last 3)   Recent Labs  08/12/16 1126 08/12/16 2131 08/13/16 0613  GLUCAP 200* 85 166*    Assessment/Plan: S/P Procedure(s) (LRB): CORONARY ARTERY BYPASS GRAFTING (CABG) x 3, LIMA to LAD, SVG to DIAGONAL, SVG to OM1, SVG to DISTAL RCA, USING LEFT MAMMARY ARTERY AND RIGHT GREATER SAPHENOUS VEIN HARVESTED ENDOSCOPICALLY (N/A) TRANSESOPHAGEAL ECHOCARDIOGRAM (TEE) (N/A)  1. CV - SBP in the 150's. On Norvasc 10 mg daily, Coreg 25 mg bid, and Bidil 20/37.5 mg bid. ASA. May need to add another agent. On all home medication.  2.  Pulmonary - On room air. Encourage incentive spirometer.  3. AKI-creatinine slightly decreased to 4.02 this am. Nephrology following. 4.  Acute blood loss anemia - H and H stable 5. Urinary retention-BPH. Per Dr. Alinda Money, foley to remain and patient will follow up with Dr. Louis Meckel after discharge. Do NOT remove foley. Continue Doxazosin 4 mg bid and Flomax 0.4 mg daily.  6. On Lasix 80 mg daily for volume overload 7. DM-CBGs moderate control. On Insulin. Pre op HGA1C. Will need outpatient f/u 8. Lower sternal wound with scant drainage. No need for antibiotic at this time but will continue to monitor. Dressing changes as ordered. 9. Gently supplement potassium, nephrology following  Plan: Will see if candidate for CIR;if not, to SNF. Tiny amount of clear drainage on distal aspect of  sternal incision. Will continue to monitor.     LOS: 18 days    [name] 08/13/2016   Chart reviewed, patient examined, agree with above.

## 2016-08-13 NOTE — Progress Notes (Signed)
CARDIAC REHAB PHASE I   PRE:  Rate/Rhythm: 72 SR  BP:  Supine:   Sitting: 143/50  Standing:    SaO2: 97 RA  MODE:  Ambulation: 180 ft   POST:  Rate/Rhythm: 84 SR  BP:  Supine:   Sitting: 149/62  Standing:    SaO2: 98 RA [id number]  Assisted X 2  used rollatorv and gait belt  to ambulate. Gait wobbly especially when he first starts. Pt able to walk 180 feet with one sitting rest stop. VS stable. Pt ask to go back to bed after after walk states that he has been in chair since 4:30 am. We encouraged two more walks today and use of IS. Today he increased his distance 80 feet.  Rodney Langton RN 08/13/2016 9:51 AM

## 2016-08-13 NOTE — Progress Notes (Signed)
Patient ambulated about 6 feet in the room and then c/o of feeling weak patient was helped back to the recliner, call bell within reach will continue to monitor. will continue to monitor.

## 2016-08-14 LAB — GLUCOSE, CAPILLARY
GLUCOSE-CAPILLARY: 172 mg/dL — AB (ref 65–99)
Glucose-Capillary: 135 mg/dL — ABNORMAL HIGH (ref 65–99)
Glucose-Capillary: 216 mg/dL — ABNORMAL HIGH (ref 65–99)
Glucose-Capillary: 130 mg/dL — ABNORMAL HIGH (ref 65–99)

## 2016-08-14 MED ORDER — ISOSORB DINITRATE-HYDRALAZINE 20-37.5 MG PO TABS
2.0000 | ORAL_TABLET | Freq: Two times a day (BID) | ORAL | Status: DC
Start: 2016-08-14 — End: 2016-08-16
  Administered 2016-08-14 – 2016-08-15 (×3): 2 via ORAL
  Filled 2016-08-14 (×3): qty 2

## 2016-08-14 NOTE — Progress Notes (Addendum)
[address]       [place] [address]             [phone]      9 Days Post-Op Procedure(s) (LRB): CORONARY ARTERY BYPASS GRAFTING (CABG) x 3, LIMA to LAD, SVG to DIAGONAL, SVG to OM1, SVG to DISTAL RCA, USING LEFT MAMMARY ARTERY AND RIGHT GREATER SAPHENOUS VEIN HARVESTED ENDOSCOPICALLY (N/A) TRANSESOPHAGEAL ECHOCARDIOGRAM (TEE) (N/A) Subjective: Fell this morning while trying to clean himself up. States that his leg gave out.   Objective: Vital signs in last 24 hours: Temp:  [98.4 F (36.9 C)-99.3 F (37.4 C)] 99.3 F (37.4 C) (07/01 0535) Pulse Rate:  [62-79] 78 (07/01 0535) Cardiac Rhythm: Normal sinus rhythm (07/01 0715) Resp:  [18-20] 18 (07/01 0535) BP: (136-193)/(54-84) 148/58 (07/01 0535) SpO2:  [95 %-97 %] 95 % (07/01 0535) Weight:  [89.9 kg (198 lb 1.6 oz)] 89.9 kg (198 lb 1.6 oz) (07/01 0535)     Intake/Output from previous day: 06/30 0701 - 07/01 0700 In: 720 [P.O.:720] Out: 925 [Urine:925] Intake/Output this shift: No intake/output data recorded.  General appearance: alert, cooperative and no distress Heart: regular rate and rhythm, S1, S2 normal, no murmur, click, rub or gallop Lungs: clear to auscultation bilaterally Abdomen: soft, non-tender; bowel sounds normal; no masses,  no organomegaly Extremities: right leg edema Wound: clean and dry with a small amount of clear drainage on dressing, EVH site is healing well.   Lab Results: No results for input(s): WBC, HGB, HCT, PLT in the last 72 hours. BMET:  Recent Labs  08/12/16 0240 08/13/16 0148  NA 136 136  K 3.5 3.7  CL 107 106  CO2 20* 19*  GLUCOSE 136* 184*  BUN 63* 63*  CREATININE 4.14* 4.02*  CALCIUM 8.6* 8.8*    PT/INR: No results for input(s): LABPROT, INR in the last 72 hours. ABG    Component Value Date/Time   PHART 7.423 08/05/2016 2234   HCO3 20.2 08/05/2016 2234   TCO2 21 08/06/2016 1724   ACIDBASEDEF 3.0 (H) 08/05/2016 2234   O2SAT 93.0 08/05/2016  2234   CBG (last 3)   Recent Labs  08/13/16 1648 08/13/16 2046 08/14/16 0626  GLUCAP 128* 116* 172*    Assessment/Plan: S/P Procedure(s) (LRB): CORONARY ARTERY BYPASS GRAFTING (CABG) x 3, LIMA to LAD, SVG to DIAGONAL, SVG to OM1, SVG to DISTAL RCA, USING LEFT MAMMARY ARTERY AND RIGHT GREATER SAPHENOUS VEIN HARVESTED ENDOSCOPICALLY (N/A) TRANSESOPHAGEAL ECHOCARDIOGRAM (TEE) (N/A)  1. CV - Last 3 BP 148/58, 169/62, 193/71.  On Norvasc 10 mg daily, Coreg 25 mg bid, and Bidil 20/37.5 mg bid. ASA. May need to add another agent. On all home medication.  2. Pulmonary - On room air. Encourage incentive spirometer.  3. AKI-creatinine slightly decreased to 4.02yesterday. Nephrology following. 4. Acute blood loss anemia - H and H stable 5. Urinary retention-BPH. Per Dr. Alinda Money, foley to remain and patient will follow up with Dr. Louis Meckel after discharge. Do NOT remove foley.Continue Doxazosin 4 mg bid and Flomax 0.4 mg daily.  6. On Lasix 80 mg daily for volume overload 7. DM-CBGs moderate control. On Insulin. Pre op HGA1C. Will need outpatient f/u 8. Lower sternal wound with scant drainage. No need for antibiotic at this time but will continue to monitor. Dressing changes as ordered. 9. Gently supplement potassium, nephrology following  Plan: No sternal instability on palpation from fall. No increase in drainage on sternal wound dressing. Patient will require CIR/SNF at discharge.  Case management following. Will discuss adding another BP agent with Dr. Cyndia Bent. Continue to work with PT/OT.     LOS: 19 days    [name] 08/14/2016   Chart reviewed, patient examined, agree with above. His BP is mostly in the 140's. Cardiology increased Bidil. Renal function stable. Sternal wound looks ok.

## 2016-08-14 NOTE — Progress Notes (Signed)
Pt had an assisted fall in the bathroom. The Nurse assistant took the patient to the bathroom and was assisting patient to get up. The patient's legs gave up and the NA assisted him to the floor. Pt stated that he did not feel his legs. VSS, EKG done and WNL. Pt put back in bed, upon assessment, Patient Alert and oriented, no apparent injury noted. Dr. Eula Fried notified. We'll continue to monitor.

## 2016-08-14 NOTE — Progress Notes (Signed)
Assessment:  1 CKD 4/5 2 Nonoliguric AKI, now improving 3 Urinary retention, foley requiring 4 s/p CABG   Plan: Diuretic on hold, but anticipate need to restart in future; will follow   Subjective: Interval History: feels Ok  Objective: Vital signs in last 24 hours: Temp:  [98.4 F (36.9 C)-99.3 F (37.4 C)] 99.3 F (37.4 C) (07/01 0535) Pulse Rate:  [62-79] 78 (07/01 0535) Resp:  [18-20] 18 (07/01 0535) BP: (136-193)/(54-84) 148/58 (07/01 0535) SpO2:  [95 %-97 %] 95 % (07/01 0535) Weight:  [89.9 kg (198 lb 1.6 oz)] 89.9 kg (198 lb 1.6 oz) (07/01 0535) Weight change: 0.758 kg (1 lb 10.7 oz)  Intake/Output from previous day: 06/30 0701 - 07/01 0700 In: 720 [P.O.:720] Out: 925 [Urine:925] Intake/Output this shift: No intake/output data recorded.  General appearance: alert and cooperative Resp: clear to auscultation bilaterally Chest wall: healing sternotomy Cardio: RRR Extremities: No edema  Lab Results: No results for input(s): WBC, HGB, HCT, PLT in the last 72 hours. BMET:  Recent Labs  08/12/16 0240 08/13/16 0148  NA 136 136  K 3.5 3.7  CL 107 106  CO2 20* 19*  GLUCOSE 136* 184*  BUN 63* 63*  CREATININE 4.14* 4.02*  CALCIUM 8.6* 8.8*   No results for input(s): PTH in the last 72 hours. Iron Studies: No results for input(s): IRON, TIBC, TRANSFERRIN, FERRITIN in the last 72 hours. Studies/Results: No results found.  Scheduled: . allopurinol  100 mg Oral Daily  . amLODipine  10 mg Oral Daily  . aspirin  325 mg Oral Daily  . carvedilol  25 mg Oral BID WC  . doxazosin  4 mg Oral BID  . enoxaparin (LOVENOX) injection  30 mg Subcutaneous Q24H  . insulin aspart  0-24 Units Subcutaneous TID AC & HS  . insulin aspart  3 Units Subcutaneous TID WC  . isosorbide-hydrALAZINE  1 tablet Oral BID  . levothyroxine  100 mcg Oral QAC breakfast  . lubiprostone  24 mcg Oral BID  . pantoprazole  40 mg Oral QAC breakfast  . rosuvastatin  10 mg Oral QPM  . sodium  chloride flush  3 mL Intravenous Q12H  . tamsulosin  0.4 mg Oral Daily    LOS: 19 days   Anna Beaird C 08/14/2016,8:56 AM

## 2016-08-14 NOTE — Progress Notes (Signed)
DAILY PROGRESS NOTE   Patient Name: [name] Date of Encounter: 08/14/2016  Hospital Problem List   Principal Problem:   Acute on chronic combined systolic and diastolic CHF (congestive heart failure) (HCC) Active Problems:   Essential hypertension   BPH with obstruction/lower urinary tract symptoms   AKI (acute kidney injury) ([place])   CKD (chronic kidney disease) stage 4, GFR 15-29 ml/min (HCC)   Elevated troponin   Hypothyroidism   Dyslipidemia   Diabetes mellitus, insulin-dependent (IDDM or type I) ([place])   NSTEMI (non-ST elevated myocardial infarction) ([place])   Acute pulmonary edema (HCC)   Hypoxemia   Acute respiratory failure with hypoxia (HCC)   CHF (congestive heart failure) (HCC)   Hx of CABG   S/P CABG x 4   Diabetes mellitus type 2 in nonobese (HCC)   Benign essential HTN   Acute blood loss anemia    Chief Complaint   Leg weakness  Subjective   [name] is slowly recovering from CABG. Apparently fell this am - legs weak. BP remains elevated.  Objective   Vitals:   08/14/16 0328 08/14/16 0338 08/14/16 0343 08/14/16 0535  BP: (!) 171/84 (!) 193/71 (!) 169/62 (!) 148/58  Pulse:   79 78  Resp:    18  Temp:   99.3 F (37.4 C) 99.3 F (37.4 C)  TempSrc:   Oral Oral  SpO2:   97% 95%  Weight:    198 lb 1.6 oz (89.9 kg)  Height:        Intake/Output Summary (Last 24 hours) at 08/14/16 1238 Last data filed at 08/14/16 0349  Gross per 24 hour  Intake              240 ml  Output              925 ml  Net             -685 ml   Filed Weights   08/12/16 0500 08/13/16 0349 08/14/16 0535  Weight: 212 lb 14.4 oz (96.6 kg) 196 lb 6.9 oz (89.1 kg) 198 lb 1.6 oz (89.9 kg)    Physical Exam   General appearance: alert, no distress and sitting up Neck: no carotid bruit and no JVD Lungs: diminished breath sounds bibasilar Heart: regular rate and rhythm Abdomen: soft, non-tender; bowel sounds normal; no masses,  no organomegaly Extremities: edema 1+ RLE  edema, trace LLE edema Pulses: 2+ and symmetric Skin: Skin color, texture, turgor normal. No rashes or lesions Neurologic: Grossly normal Psych: Pleasant  Inpatient Medications    Scheduled Meds: . allopurinol  100 mg Oral Daily  . amLODipine  10 mg Oral Daily  . aspirin  325 mg Oral Daily  . carvedilol  25 mg Oral BID WC  . doxazosin  4 mg Oral BID  . enoxaparin (LOVENOX) injection  30 mg Subcutaneous Q24H  . insulin aspart  0-24 Units Subcutaneous TID AC & HS  . insulin aspart  3 Units Subcutaneous TID WC  . isosorbide-hydrALAZINE  1 tablet Oral BID  . levothyroxine  100 mcg Oral QAC breakfast  . lubiprostone  24 mcg Oral BID  . pantoprazole  40 mg Oral QAC breakfast  . rosuvastatin  10 mg Oral QPM  . sodium chloride flush  3 mL Intravenous Q12H  . tamsulosin  0.4 mg Oral Daily    Continuous Infusions: . sodium chloride      PRN Meds: sodium chloride, acetaminophen, bisacodyl **OR** bisacodyl, ondansetron **OR** ondansetron (  ZOFRAN) IV, oxyCODONE, sodium chloride flush, traMADol   Labs   Results for orders placed or performed during the hospital encounter of 07/25/16 (from the past 48 hour(s))  Glucose, capillary     Status: None   Collection Time: 08/12/16  9:31 PM  Result Value Ref Range   Glucose-Capillary 85 65 - 99 mg/dL   Comment 1 Notify RN    Comment 2 Document in Chart   Basic metabolic panel     Status: Abnormal   Collection Time: 08/13/16  1:48 AM  Result Value Ref Range   Sodium 136 135 - 145 mmol/L   Potassium 3.7 3.5 - 5.1 mmol/L   Chloride 106 101 - 111 mmol/L   CO2 19 (L) 22 - 32 mmol/L   Glucose, Bld 184 (H) 65 - 99 mg/dL   BUN 63 (H) 6 - 20 mg/dL   Creatinine, Ser 4.02 (H) 0.61 - 1.24 mg/dL   Calcium 8.8 (L) 8.9 - 10.3 mg/dL   GFR calc non Af Amer 14 (L) >60 mL/min   GFR calc Af Amer 16 (L) >60 mL/min    Comment: (NOTE) The eGFR has been calculated using the CKD EPI equation. This calculation has not been validated in all clinical  situations. eGFR's persistently <60 mL/min signify possible Chronic Kidney Disease.    Anion gap 11 5 - 15  Glucose, capillary     Status: Abnormal   Collection Time: 08/13/16  6:13 AM  Result Value Ref Range   Glucose-Capillary 166 (H) 65 - 99 mg/dL  Glucose, capillary     Status: Abnormal   Collection Time: 08/13/16 12:04 PM  Result Value Ref Range   Glucose-Capillary 164 (H) 65 - 99 mg/dL  Glucose, capillary     Status: Abnormal   Collection Time: 08/13/16  4:48 PM  Result Value Ref Range   Glucose-Capillary 128 (H) 65 - 99 mg/dL   Comment 1 Notify RN    Comment 2 Document in Chart   Glucose, capillary     Status: Abnormal   Collection Time: 08/13/16  8:46 PM  Result Value Ref Range   Glucose-Capillary 116 (H) 65 - 99 mg/dL   Comment 1 Notify RN    Comment 2 Document in Chart   Glucose, capillary     Status: Abnormal   Collection Time: 08/14/16  6:26 AM  Result Value Ref Range   Glucose-Capillary 172 (H) 65 - 99 mg/dL   Comment 1 Notify RN    Comment 2 Document in Chart   Glucose, capillary     Status: Abnormal   Collection Time: 08/14/16 11:40 AM  Result Value Ref Range   Glucose-Capillary 135 (H) 65 - 99 mg/dL    ECG   N/A  Telemetry   Sinus rhythm - Personally Reviewed  Radiology    No results found.  Cardiac Studies   N/A  Assessment   1. Principal Problem: 2.   Acute on chronic combined systolic and diastolic CHF (congestive heart failure) ([place]) 3. Active Problems: 4.   Essential hypertension 5.   BPH with obstruction/lower urinary tract symptoms 6.   AKI (acute kidney injury) ([place]) 7.   CKD (chronic kidney disease) stage 4, GFR 15-29 ml/min (HCC) 8.   Elevated troponin 9.   Hypothyroidism 10.   Dyslipidemia 11.   Diabetes mellitus, insulin-dependent (IDDM or type I) ([place]) 12.   NSTEMI (non-ST elevated myocardial infarction) ([place]) 13.   Acute pulmonary edema (HCC) 14.   Hypoxemia 15.   Acute  respiratory failure with hypoxia ([place]) 16.   CHF  (congestive heart failure) ([place]) 17.   Hx of CABG 18.   S/P CABG x 4 19.   Diabetes mellitus type 2 in nonobese (HCC) 20.   Benign essential HTN 21.   Acute blood loss anemia 22.   Plan   1. Slow recovery - not much appetite. Weak. Will probably need CIR/SNF. Still volume overloaded. 3L negative - on lasix. Urinary retention - urology involved. BP remains elevated. I have increased bidil to 2 tablets BID. Would not recommend aldactone given low GFR.  Time Spent Directly with Patient:  I have spent a total of 15 minutes with the patient reviewing hospital notes, telemetry, EKGs, labs and examining the patient as well as establishing an assessment and plan that was discussed personally with the patient. > 50% of time was spent in direct patient care.  Length of Stay:  LOS: 19 days   Pixie Casino, MD, [place]  Attending Cardiologist  Direct Dial: [phone]  Fax: [phone]  Website:  [email or web address] 08/14/2016, 12:38 PM

## 2016-08-15 ENCOUNTER — Encounter (HOSPITAL_COMMUNITY): Payer: Self-pay | Admitting: Physician Assistant

## 2016-08-15 LAB — ECHO TEE
AO mean calculated velocity dopler: 84.5 cm/s
AV Area VTI index: 0.86 cm2/m2
AV Area VTI: 1.99 cm2
AV Area mean vel: 1.95 cm2
AV Mean grad: 3 mmHg
AV Peak grad: 5 mmHg
AV VEL mean LVOT/AV: 0.69
AV area mean vel ind: 0.9 cm2/m2
AV peak Index: 0.91
AV pk vel: 115 cm/s
AV vel: 1.87
Ao pk vel: 0.7 m/s
LVOT MV VTI INDEX: 1.09 cm2/m2
LVOT MV VTI: 2.38
LVOT SV: 56 mL
LVOT VTI: 19.8 cm
LVOT area: 2.84 cm2
LVOT diameter: 19 mm
LVOT peak VTI: 0.66 cm
LVOT peak vel: 80.7 cm/s
MV Annulus VTI: 23.6 cm
MV M vel: 45.4
Mean grad: 1 mmHg
VTI: 30 cm
Valve area index: 0.86
Valve area: 1.87 cm2

## 2016-08-15 LAB — GLUCOSE, CAPILLARY
GLUCOSE-CAPILLARY: 150 mg/dL — AB (ref 65–99)
GLUCOSE-CAPILLARY: 203 mg/dL — AB (ref 65–99)
GLUCOSE-CAPILLARY: 118 mg/dL — AB (ref 65–99)
Glucose-Capillary: 136 mg/dL — ABNORMAL HIGH (ref 65–99)

## 2016-08-15 LAB — MAGNESIUM: Magnesium: 2.2 mg/dL (ref 1.7–2.4)

## 2016-08-15 NOTE — Progress Notes (Addendum)
      [address]       [place] [address]             [phone]        10 Days Post-Op Procedure(s) (LRB): CORONARY ARTERY BYPASS GRAFTING (CABG) x 3, LIMA to LAD, SVG to DIAGONAL, SVG to OM1, SVG to DISTAL RCA, USING LEFT MAMMARY ARTERY AND RIGHT GREATER SAPHENOUS VEIN HARVESTED ENDOSCOPICALLY (N/A) TRANSESOPHAGEAL ECHOCARDIOGRAM (TEE) (N/A)  Subjective: Patient without specific complaints this am. He had a bowel movement yesterday.  Objective: Vital signs in last 24 hours: Temp:  [98.3 F (36.8 C)-98.7 F (37.1 C)] 98.3 F (36.8 C) (07/02 0536) Pulse Rate:  [72-77] 76 (07/02 0536) Cardiac Rhythm: Normal sinus rhythm;Heart block (07/01 1956) Resp:  [18] 18 (07/02 0536) BP: (138-141)/(51-56) 138/54 (07/02 0536) SpO2:  [95 %-97 %] 97 % (07/02 0536) Weight:  [89.9 kg (198 lb 3.2 oz)] 89.9 kg (198 lb 3.2 oz) (07/02 0536)  Pre op weight 93.2 kg Current Weight  08/15/16 89.9 kg (198 lb 3.2 oz)      Intake/Output from previous day: 07/01 0701 - 07/02 0700 In: 720 [P.O.:720] Out: 1775 [Urine:1775]   Physical Exam:  Cardiovascular: RRR Pulmonary: Slightly diminished at bases Abdomen: Soft, non tender, bowel sounds present. Extremities: Mild RLE lower extremity edema. Wounds: LE wounds are clean and dry.  No erythema or signs of infection. Lower sternal wound has no further drainage.  Lab Results: CBC: No results for input(s): WBC, HGB, HCT, PLT in the last 72 hours. BMET:   Recent Labs  08/13/16 0148  NA 136  K 3.7  CL 106  CO2 19*  GLUCOSE 184*  BUN 63*  CREATININE 4.02*  CALCIUM 8.8*    PT/INR:  Lab Results  Component Value Date   INR 1.27 08/05/2016   INR 1.12 08/05/2016   INR 1.09 07/28/2016   ABG:  INR: Will add last result for INR, ABG once components are confirmed Will add last 4 CBG results once components are confirmed  Assessment/Plan:  1. CV - SBP in the 150's. On Norvasc 10 mg daily, Coreg 25 mg bid, and Bidil 2  tabs 20/37.5 mg bid.  2.  Pulmonary - On room air. Encourage incentive spirometer.  3. AKI-last creatinine slightly decreased to 4.02 this am. Nephrology following. 4.  Acute blood loss anemia - Last H and H stable at 8.4 and 26.1 5. Urinary retention-BPH. Per Dr. Alinda Money, foley to remain and patient will follow up with Dr. Louis Meckel after discharge. Do NOT remove foley. Continue Doxazosin 4 mg bid and Flomax 0.4 mg daily.  6. Lasix has been hold-will defer to nephrology if restart 7. DM-CBGs 216/130/150. On Insulin. Pre op HGA1C 7.1. 8. Will see if candidate for CIR;if not, to SNF.  ZIMMERMAN,DONIELLE MPA-C 08/15/2016,7:44 AM   Patient insurance refused ip rehab Mobility is poor, walked in room this afternoon Cr at 4 , low point in the hospital has been 2.9 was 3.9 on admission  To snf soon I have seen and examined [name] and agree with the above assessment  and plan.  [name] MD [facility] [phone] Office [phone] 08/15/2016 6:24 PM

## 2016-08-15 NOTE — Progress Notes (Signed)
Progress Note  Patient Name: [name] Date of Encounter: 08/15/2016  Primary Cardiologist: Dr. Debara Pickett  Subjective   Patient is feeling well. Needs to be reminded of sternal precautions. Pt wishes to discharge. Encouraged IS use. Increase diuresis.   Inpatient Medications    Scheduled Meds: . allopurinol  100 mg Oral Daily  . amLODipine  10 mg Oral Daily  . aspirin  325 mg Oral Daily  . carvedilol  25 mg Oral BID WC  . doxazosin  4 mg Oral BID  . enoxaparin (LOVENOX) injection  30 mg Subcutaneous Q24H  . insulin aspart  0-24 Units Subcutaneous TID AC & HS  . insulin aspart  3 Units Subcutaneous TID WC  . isosorbide-hydrALAZINE  2 tablet Oral BID  . levothyroxine  100 mcg Oral QAC breakfast  . lubiprostone  24 mcg Oral BID  . pantoprazole  40 mg Oral QAC breakfast  . rosuvastatin  10 mg Oral QPM  . sodium chloride flush  3 mL Intravenous Q12H  . tamsulosin  0.4 mg Oral Daily   Continuous Infusions: . sodium chloride     PRN Meds: sodium chloride, acetaminophen, bisacodyl **OR** bisacodyl, ondansetron **OR** ondansetron (ZOFRAN) IV, oxyCODONE, sodium chloride flush, traMADol   Vital Signs    Vitals:   08/14/16 0535 08/14/16 1247 08/14/16 2202 08/15/16 0536  BP: (!) 148/58 (!) 141/51 (!) 140/56 (!) 138/54  Pulse: 78 77 72 76  Resp: 18 18 18 18   Temp: 99.3 F (37.4 C) 98.7 F (37.1 C) 98.5 F (36.9 C) 98.3 F (36.8 C)  TempSrc: Oral Oral Oral Oral  SpO2: 95% 96% 95% 97%  Weight: 198 lb 1.6 oz (89.9 kg)   198 lb 3.2 oz (89.9 kg)  Height:        Intake/Output Summary (Last 24 hours) at 08/15/16 0840 Last data filed at 08/15/16 0540  Gross per 24 hour  Intake              480 ml  Output             1275 ml  Net             -795 ml   Filed Weights   08/13/16 0349 08/14/16 0535 08/15/16 0536  Weight: 196 lb 6.9 oz (89.1 kg) 198 lb 1.6 oz (89.9 kg) 198 lb 3.2 oz (89.9 kg)     Physical Exam   General: Well developed, well nourished, male appearing in no  acute distress. Head: Normocephalic, atraumatic.  Neck: Supple without bruits, + JVD Lungs:  Resp regular and unlabored, CTA in upper lobes, scattered crackles in B bases Heart: RRR, S1, S2, no S3, S4, or murmur; no rub. Abdomen: Soft, non-tender, non-distended with normoactive bowel sounds. No hepatomegaly. No rebound/guarding. No obvious abdominal masses. Extremities: No clubbing, cyanosis, trace to 1+ L > R edema. Distal pedal pulses are 2+ bilaterally. Neuro: Alert and oriented X 3. Moves all extremities spontaneously. Psych: Normal affect.  Labs    Chemistry Recent Labs Lab 08/11/16 0359 08/12/16 0240 08/13/16 0148  NA 135 136 136  K 3.9 3.5 3.7  CL 105 107 106  CO2 20* 20* 19*  GLUCOSE 148* 136* 184*  BUN 65* 63* 63*  CREATININE 4.32* 4.14* 4.02*  CALCIUM 8.6* 8.6* 8.8*  GFRNONAA 13* 13* 14*  GFRAA 15* 15* 16*  ANIONGAP 10 9 11      Hematology Recent Labs Lab 08/09/16 0528 08/10/16 0423 08/11/16 0359  WBC 9.8 7.2 6.6  RBC  2.90* 2.84* 2.90*  HGB 8.4* 8.2* 8.4*  HCT 26.3* 25.5* 26.1*  MCV 90.7 89.8 90.0  MCH 29.0 28.9 29.0  MCHC 31.9 32.2 32.2  RDW 15.4 15.1 14.9  PLT 167 194 211    Cardiac EnzymesNo results for input(s): TROPONINI in the last 168 hours. No results for input(s): TROPIPOC in the last 168 hours.   BNPNo results for input(s): BNP, PROBNP in the last 168 hours.   DDimer No results for input(s): DDIMER in the last 168 hours.   Radiology    No results found.   Telemetry    Sinus rhythm, 2 episodes of 4-beat run of NSVT - Personally Reviewed  ECG    No new tracings - Personally Reviewed   Cardiac Studies   Left heart catheterization 07/28/16:  LM lesion, 75 %stenosed.  Ost Cx lesion, 60 %stenosed.  Ost LAD lesion, 60 %stenosed.  Mid LAD lesion, 90 %stenosed.  Ost 2nd Diag to 2nd Diag lesion, 95 %stenosed.  Mid Cx to Dist Cx lesion, 25 %stenosed.  Prox RCA to Mid RCA lesion, 100 %stenosed.  LV end diastolic pressure is  moderately elevated.  There is no aortic valve stenosis.  No ventriculogram done to minimize contrast exposure.   Severe multivessel CAD.  Plan for cardiac surgery consult.  Continue diuresis as renal function allows.    Echocardiogram 07/25/16: Study Conclusions - Left ventricle: The cavity size was normal. There was moderate   concentric hypertrophy. Systolic function was mildly to   moderately reduced. The estimated ejection fraction was in the   range of 40% to 45%. Mid to distal inferior, apical, lateral and   apical septal severe hypokinesis. Doppler parameters are   consistent with abnormal left ventricular relaxation (grade 1   diastolic dysfunction). The E/e&' ratio is >15, suggesting   elevated LV filling pressure. - Aortic valve: Sclerosis without stenosis. There was no   regurgitation. - Mitral valve: Mildly thickened leaflets . There was trivial   regurgitation. - Left atrium: The atrium was normal in size. - Tricuspid valve: There was mild regurgitation. - Pulmonary arteries: PA peak pressure: 40 mm Hg (S). - Inferior vena cava: The vessel was dilated. The respirophasic   diameter changes were blunted (< 50%), consistent with elevated   central venous pressure.  Impressions: - Compared to a prior study in 2014, the EF is lower at 40-45% with   regional wall motion abnormalities possibly in the LCX territory   distribution, there is diastolic dysfunction with elevated LV   filling pressure, mild pulmonary hypertension and no pericardial   effusion.    Patient Profile     71 y.o. male with a PMH significant for DM, HTN, CAD s/p NSTEMI and heart catheterization with multi-vessel disease who ultimately underwent CABG x 3, and chronic systolic and diastolic heart failure.   Assessment & Plan    1. Acute on chronic systolic and diastolic heart failure, mild pulmonary  hypertension - doubled bidil yesterday - wt is 198 lbs, down from 213 lbs at admission -  overall net negative 3.6L with 1.77L urine output yesterday - continue ASA, coreg, bidil, norvasc, and statin - consider transitioning full dose ASA to 81 mg +/- second antiplatelet therapy - lasix have not been added to regimen - he may benefit from PO lasix - labs pending   2. CAD, s/p NSTEMI and CABG x 3 (LIMA to LAD, SVG to diagonal, SVG to dRCA) - per CTS team    3. NSVT -  check magnesium level - pt denies symptoms   Signed, Ledora Bottcher , PA-C 8:40 AM 08/15/2016 Pager: [phone]   I have seen and examined the patient along with Ledora Bottcher, PA.  I have reviewed the chart, notes and new data.  I agree with PA's note.  Key new complaints: breathing well lying flat. Legs very weak Key examination changes: no JVD, mild right ankle edema Key new findings / data: creat stable at 4.  PLAN: Add daily furosemide, low dose, try to keep volume status at current level. Will need inpatient rehab before he returns home.  Sanda Klein, MD, [place] [phone] 08/15/2016, 9:52 AM

## 2016-08-15 NOTE — Progress Notes (Signed)
Inpatient Rehabilitation  Note events over the weekend with assisted fall and ongoing IP Rehab needs.  I met with patient to discuss team's recommendation.  Shared booklets and answered questions.  Patient report that he and his wife discussed the need for rehab, so that he can regain his independence with self-care tasks before returning home.  Will initiate insurance authorization with [facility].  I attempted to update wife via phone, but was unable to leave a message.  Plan to update the team as I know.  Please call with questions.   Carmelia Roller., CCC/SLP Admission Coordinator  [place]  Cell [phone]

## 2016-08-15 NOTE — Progress Notes (Signed)
Physical Therapy Treatment Patient Details Name: [name] MRN: [id number] DOB: August 27, 1945 Today's Date: 08/15/2016    History of Present Illness 71 year old male w/ acute on chronic heart failure, c/b CRI stage IV admitted on 07/25/16 for SOB and chest pain.  Dx with NSTEMI and severe multi vessel CAD per L heart cath 07/28/16.  s/p CABG x 3 08/05/16.  PMH positive for HTN, DM, CKD, and lumbar fusion.      PT Comments    Pt fatigued very quickly with mobility. Knees sagging into flexion with amb and only able to go 10' before having to sit in chair.   Follow Up Recommendations  CIR     Equipment Recommendations  Rolling walker with 5" wheels    Recommendations for Other Services       Precautions / Restrictions Precautions Precautions: Fall;Sternal    Mobility  Bed Mobility Overal bed mobility: Needs Assistance Bed Mobility: Sit to Sidelying         Sit to sidelying: Min assist General bed mobility comments: Assist to bring legs up into bed  Transfers Overall transfer level: Needs assistance Equipment used: Rolling walker (2 wheeled) Transfers: Sit to/from [facility] Sit to Stand: Mod assist;+2 safety/equipment Stand pivot transfers: Min assist;+2 safety/equipment       General transfer comment: Verbal cues for hand placement and assist to bring hips up. Pivotal steps with walker for chair to bed.  Ambulation/Gait Ambulation/Gait assistance: Min assist;Mod assist Ambulation Distance (Feet): 10 Feet Assistive device: Rolling walker (2 wheeled) Gait Pattern/deviations: Trunk flexed;Decreased step length - right;Step-through pattern;Decreased step length - left Gait velocity: decr Gait velocity interpretation: Below normal speed for age/gender General Gait Details: Assist for balance and support. Pt with bilateral knees in flexion during stance throughout. Fatigued quickly and had recliner behing pt   Stairs            Wheelchair  Mobility    Modified Rankin (Stroke Patients Only)       Balance Overall balance assessment: Needs assistance Sitting-balance support: No upper extremity supported;Feet supported Sitting balance-Leahy Scale: Good     Standing balance support: Bilateral upper extremity supported Standing balance-Leahy Scale: Poor Standing balance comment: walker and min assist for static standing                            Cognition Arousal/Alertness: Awake/alert Behavior During Therapy: Flat affect Overall Cognitive Status: Impaired/Different from baseline Area of Impairment: Memory                     Memory: Decreased short-term memory                Exercises      General Comments        Pertinent Vitals/Pain Pain Assessment: No/denies pain    Home Living                      Prior Function            PT Goals (current goals can now be found in the care plan section) Progress towards PT goals: Not progressing toward goals - comment (Pt fatiguing quickly.)    Frequency    Min 3X/week      PT Plan Current plan remains appropriate    Co-evaluation              AM-PAC PT "6 Clicks" Daily Activity  Outcome Measure  Difficulty turning over in bed (including adjusting bedclothes, sheets and blankets)?: A Little Difficulty moving from lying on back to sitting on the side of the bed? : Total Difficulty sitting down on and standing up from a chair with arms (e.g., wheelchair, bedside commode, etc,.)?: Total Help needed moving to and from a bed to chair (including a wheelchair)?: A Lot Help needed walking in hospital room?: A Lot Help needed climbing 3-5 steps with a railing? : Total 6 Click Score: 10    End of Session Equipment Utilized During Treatment: Gait belt Activity Tolerance: Patient limited by fatigue Patient left: in bed;with call bell/phone within reach;with bed alarm set Nurse Communication: Mobility status PT Visit  Diagnosis: Muscle weakness (generalized) (M62.81);Difficulty in walking, not elsewhere classified (R26.2)     Time: 5670-1410 PT Time Calculation (min) (ACUTE ONLY): 21 min  Charges:  $Gait Training: 8-22 mins                    G Codes:       [facility] PT [place] 08/15/2016, 4:19 PM

## 2016-08-15 NOTE — Progress Notes (Signed)
Inpatient Rehabilitation  I have received an insurance denial for IP Rehab from [facility], who stated that patient can be cared for at a lower level of care.  I have notified RNCM and CSW.  Plan to sign off at this time.  Please call with questions.   Carmelia Roller., CCC/SLP Admission Coordinator  [place]  Cell [phone]

## 2016-08-15 NOTE — Care Management Important Message (Signed)
Important Message  Patient Details  Name: [name] MRN: [id number] Date of Birth: [birth year]   Medicare Important Message Given:  Yes    Hyder Deman [place]ena 08/15/2016, 10:10 AM

## 2016-08-15 NOTE — Progress Notes (Signed)
Patient ID: [name], male   DOB: [birth year], 71 y.o.   MRN: [id number] S:No new complaints O:BP (!) 138/54 (BP Location: Right Arm)   Pulse 76   Temp 98.3 F (36.8 C) (Oral)   Resp 18   Ht 6\' 1"  (1.854 m)   Wt 89.9 kg (198 lb 3.2 oz)   SpO2 97%   BMI 26.15 kg/m   Intake/Output Summary (Last 24 hours) at 08/15/16 1116 Last data filed at 08/15/16 0540  Gross per 24 hour  Intake              480 ml  Output             1275 ml  Net             -795 ml   Intake/Output: I/O last 3 completed shifts: In: 720 [P.O.:720] Out: 2200 [Urine:2200]  Intake/Output this shift:  No intake/output data recorded. Weight change: 0.045 kg (1.6 oz) Gen:NAD CVS:no rub Resp:cta TTS:[id number] Ext:1+ pitting edema, right leg   Recent Labs Lab 08/09/16 0528 08/10/16 0423 08/11/16 0359 08/12/16 0240 08/13/16 0148  NA 134* 137 135 136 136  K 3.8 3.0* 3.9 3.5 3.7  CL 105 106 105 107 106  CO2 17* 20* 20* 20* 19*  GLUCOSE 155* 122* 148* 136* 184*  BUN 57* 64* 65* 63* 63*  CREATININE 4.33* 4.29* 4.32* 4.14* 4.02*  CALCIUM 8.6* 8.4* 8.6* 8.6* 8.8*   Liver Function Tests: No results for input(s): AST, ALT, ALKPHOS, BILITOT, PROT, ALBUMIN in the last 168 hours. No results for input(s): LIPASE, AMYLASE in the last 168 hours. No results for input(s): AMMONIA in the last 168 hours. CBC:  Recent Labs Lab 08/09/16 0528 08/10/16 0423 08/11/16 0359  WBC 9.8 7.2 6.6  HGB 8.4* 8.2* 8.4*  HCT 26.3* 25.5* 26.1*  MCV 90.7 89.8 90.0  PLT 167 194 211   Cardiac Enzymes: No results for input(s): CKTOTAL, CKMB, CKMBINDEX, TROPONINI in the last 168 hours. CBG:  Recent Labs Lab 08/14/16 0626 08/14/16 1140 08/14/16 1609 08/14/16 2200 08/15/16 0645  GLUCAP 172* 135* 216* 130* 150*    Iron Studies: No results for input(s): IRON, TIBC, TRANSFERRIN, FERRITIN in the last 72 hours. Studies/Results: No results found. Marland Kitchen allopurinol  100 mg Oral Daily  . amLODipine  10 mg Oral Daily  . aspirin   325 mg Oral Daily  . carvedilol  25 mg Oral BID WC  . doxazosin  4 mg Oral BID  . enoxaparin (LOVENOX) injection  30 mg Subcutaneous Q24H  . insulin aspart  0-24 Units Subcutaneous TID AC & HS  . insulin aspart  3 Units Subcutaneous TID WC  . isosorbide-hydrALAZINE  2 tablet Oral BID  . levothyroxine  100 mcg Oral QAC breakfast  . lubiprostone  24 mcg Oral BID  . pantoprazole  40 mg Oral QAC breakfast  . rosuvastatin  10 mg Oral QPM  . sodium chloride flush  3 mL Intravenous Q12H  . tamsulosin  0.4 mg Oral Daily    BMET    Component Value Date/Time   NA 136 08/13/2016 0148   K 3.7 08/13/2016 0148   CL 106 08/13/2016 0148   CO2 19 (L) 08/13/2016 0148   GLUCOSE 184 (H) 08/13/2016 0148   BUN 63 (H) 08/13/2016 0148   CREATININE 4.02 (H) 08/13/2016 0148   CALCIUM 8.8 (L) 08/13/2016 0148   GFRNONAA 14 (L) 08/13/2016 0148   GFRAA 16 (L) 08/13/2016 0148   CBC  Component Value Date/Time   WBC 6.6 08/11/2016 0359   RBC 2.90 (L) 08/11/2016 0359   HGB 8.4 (L) 08/11/2016 0359   HCT 26.1 (L) 08/11/2016 0359   PLT 211 08/11/2016 0359   MCV 90.0 08/11/2016 0359   MCH 29.0 08/11/2016 0359   MCHC 32.2 08/11/2016 0359   RDW 14.9 08/11/2016 0359   LYMPHSABS 1.5 10/03/2006 1445   MONOABS 0.5 10/03/2006 1445   EOSABS 0.4 10/03/2006 1445   BASOSABS 0.0 10/03/2006 1445     Assessment/Plan:  1. AKI/CKD stage 4 (baseline Scr 2.65 in 2015 follows Dr. Hinda Lenis in [place], unknown what most recent baseline has been).  Following CABG/hypotension/ischemic ATN, ABLA as well as urinary retention/bladder obstruction.  Scr improving to 4 but not check over the weekend.  Will check in am.  No uremic symptoms. 2. Urinary retention- s/p foley 3. Anemia of CKD stage 4 as well as ABLA post CABG- start esa and transfuse prn 4. CAD s/p CABG x 3 POD #10 5. Acute on chronic CHF- [place], MD [facility] [phone]

## 2016-08-15 NOTE — Progress Notes (Signed)
Occupational Therapy Treatment Patient Details Name: [name] MRN: [id number] DOB: 03/18/1945 Today's Date: 08/15/2016    History of present illness 71 year old male w/ acute on chronic heart failure, c/b CRI stage IV admitted on 07/25/16 for SOB and chest pain.  Dx with NSTEMI and severe multi vessel CAD per L heart cath 07/28/16.  s/p CABG x 3 08/05/16.  PMH positive for HTN, DM, CKD, and lumbar fusion.     OT comments  Pt with LE weakness and feeling as if he would buckle when standing, deferred grooming to seated in chair. Pt with difficulty generalizing sternal precautions. Pt is eager to go home, disappointed in his slow progress. Continues to require post acute rehab.  Follow Up Recommendations  CIR    Equipment Recommendations  3 in 1 bedside commode    Recommendations for Other Services      Precautions / Restrictions Precautions Precautions: Fall;Sternal Precaution Comments: able to state, but not generalize sternal precautions       Mobility Bed Mobility Overal bed mobility: Needs Assistance Bed Mobility: Sidelying to Sit   Sidelying to sit: Min assist       General bed mobility comments: cues to avoid UE use, min to raise trunk  Transfers Overall transfer level: Needs assistance Equipment used: Rolling walker (2 wheeled) Transfers: Sit to/from Stand Sit to Stand: Min guard;From elevated surface         General transfer comment: cues for hands on knees, to scoot hips to edge and use of momentum, min guard for safety    Balance     Sitting balance-Leahy Scale: Good       Standing balance-Leahy Scale: Poor                             ADL either performed or assessed with clinical judgement   ADL Overall ADL's : Needs assistance/impaired     Grooming: Oral care;Sitting;Set up;Wash/dry hands           Upper Body Dressing : Sitting;Set up       Toilet Transfer: Minimal assistance;Stand-pivot;RW;BSC   Toileting- Clothing  Manipulation and Hygiene: Minimal assistance;Sit to/from stand         General ADL Comments: Pt with increased weakness today, felt like his knees were going to buckle. Educated pt in use of 3 in 1 over toilet as he had an episode of knees buckling upon rising from toilet over the weekend.     Vision       Perception     Praxis      Cognition Arousal/Alertness: Awake/alert Behavior During Therapy: WFL for tasks assessed/performed Overall Cognitive Status: Impaired/Different from baseline Area of Impairment: Memory                     Memory: Decreased short-term memory;Decreased recall of precautions                  Exercises     Shoulder Instructions       General Comments      Pertinent Vitals/ Pain       Pain Assessment: Faces Faces Pain Scale: No hurt  Home Living                                          Prior Functioning/Environment  Frequency  Min 2X/week        Progress Toward Goals  OT Goals(current goals can now be found in the care plan section)  Progress towards OT goals: Not progressing toward goals - comment (knees buckling)  Acute Rehab OT Goals Patient Stated Goal: to get stronger and return home.  OT Goal Formulation: With patient Time For Goal Achievement: 08/26/16 Potential to Achieve Goals: Good  Plan Discharge plan remains appropriate    Co-evaluation                 AM-PAC PT "6 Clicks" Daily Activity     Outcome Measure   Help from another person eating meals?: None Help from another person taking care of personal grooming?: A Little Help from another person toileting, which includes using toliet, bedpan, or urinal?: A Little Help from another person bathing (including washing, rinsing, drying)?: A Lot Help from another person to put on and taking off regular upper body clothing?: None Help from another person to put on and taking off regular lower body clothing?: A  Lot 6 Click Score: 18    End of Session Equipment Utilized During Treatment: Gait belt;Rolling walker  OT Visit Diagnosis: Unsteadiness on feet (R26.81);Muscle weakness (generalized) (M62.81)   Activity Tolerance Patient tolerated treatment well   Patient Left in chair;with call bell/phone within reach   Nurse Communication          Time: 5320-2334 OT Time Calculation (min): 31 min  Charges: OT General Charges $OT Visit: 1 Procedure OT Treatments $Self Care/Home Management : 23-37 mins    [name] 08/15/2016, 10:06 AM  [phone]

## 2016-08-15 NOTE — Progress Notes (Signed)
Clinical Social Worker met patient and family at bedside to make them aware that unfortunately  insurance declined patient admission into CIR. Patient stated he would still want to pursue rehab and is agreeable to go to SNF. Patient stated he would prefer [facility] as his first choice. CSW contacted facility to make them aware of patients interest and left a voicemail. Patient stated if he is unable to get into [place] then [facility] is the second option. Patient stated he would prefer a private room during his stay at SNF.  Rhea Pink, MSW,  [place]

## 2016-08-16 LAB — RENAL FUNCTION PANEL
ALBUMIN: 2.4 g/dL — AB (ref 3.5–5.0)
ANION GAP: 11 (ref 5–15)
BUN: 62 mg/dL — ABNORMAL HIGH (ref 6–20)
CALCIUM: 8.6 mg/dL — AB (ref 8.9–10.3)
CO2: 18 mmol/L — AB (ref 22–32)
CREATININE: 4 mg/dL — AB (ref 0.61–1.24)
Chloride: 107 mmol/L (ref 101–111)
GFR calc non Af Amer: 14 mL/min — ABNORMAL LOW (ref >60)
GFR, EST AFRICAN AMERICAN: 16 mL/min — AB (ref >60)
GLUCOSE: 129 mg/dL — AB (ref 65–99)
PHOSPHORUS: 4.1 mg/dL (ref 2.5–4.6)
Potassium: 3.7 mmol/L (ref 3.5–5.1)
SODIUM: 136 mmol/L (ref 135–145)

## 2016-08-16 LAB — GLUCOSE, CAPILLARY
GLUCOSE-CAPILLARY: 146 mg/dL — AB (ref 65–99)
GLUCOSE-CAPILLARY: 152 mg/dL — AB (ref 65–99)
Glucose-Capillary: 207 mg/dL — ABNORMAL HIGH (ref 65–99)
Glucose-Capillary: 138 mg/dL — ABNORMAL HIGH (ref 65–99)

## 2016-08-16 LAB — CBC
HCT: 24.4 % — ABNORMAL LOW (ref 39.0–52.0)
HEMOGLOBIN: 7.9 g/dL — AB (ref 13.0–17.0)
MCH: 28.8 pg (ref 26.0–34.0)
MCHC: 32.4 g/dL (ref 30.0–36.0)
MCV: 89.1 fL (ref 78.0–100.0)
PLATELETS: 260 10*3/uL (ref 150–400)
RBC: 2.74 MIL/uL — AB (ref 4.22–5.81)
RDW: 14.8 % (ref 11.5–15.5)
WBC: 6.5 10*3/uL (ref 4.0–10.5)

## 2016-08-16 LAB — PREPARE RBC (CROSSMATCH)

## 2016-08-16 MED ORDER — ISOSORB DINITRATE-HYDRALAZINE 20-37.5 MG PO TABS
2.0000 | ORAL_TABLET | Freq: Three times a day (TID) | ORAL | Status: DC
  Administered 2016-08-16 – 2016-08-17 (×4): 2 via ORAL
  Filled 2016-08-16 (×4): qty 2

## 2016-08-16 MED ORDER — OXYCODONE HCL 5 MG PO TABS: ORAL_TABLET | ORAL | 0 refills | Status: DC

## 2016-08-16 MED ORDER — FOLIC ACID 1 MG PO TABS: 1.0000 mg | ORAL_TABLET | Freq: Every day | ORAL | Status: DC

## 2016-08-16 MED ORDER — FERROUS SULFATE 325 (65 FE) MG PO TABS: 325.0000 mg | ORAL_TABLET | Freq: Every day | ORAL | 3 refills | Status: DC

## 2016-08-16 MED ORDER — FOLIC ACID 1 MG PO TABS
1.0000 mg | ORAL_TABLET | Freq: Every day | ORAL | Status: DC
  Administered 2016-08-16 – 2016-08-17 (×2): 1 mg via ORAL
  Filled 2016-08-16 (×2): qty 1

## 2016-08-16 MED ORDER — ACETAMINOPHEN 325 MG PO TABS: 650.0000 mg | ORAL_TABLET | Freq: Four times a day (QID) | ORAL | Status: AC | PRN

## 2016-08-16 MED ORDER — FUROSEMIDE 40 MG PO TABS
40.0000 mg | ORAL_TABLET | Freq: Every day | ORAL | Status: DC
  Administered 2016-08-17: 40 mg via ORAL
  Filled 2016-08-16: qty 1

## 2016-08-16 MED ORDER — FERROUS SULFATE 325 (65 FE) MG PO TABS
325.0000 mg | ORAL_TABLET | Freq: Every day | ORAL | Status: DC
  Administered 2016-08-17: 325 mg via ORAL
  Filled 2016-08-16: qty 1

## 2016-08-16 MED ORDER — BIDIL 20-37.5 MG PO TABS: 1.0000 | ORAL_TABLET | Freq: Three times a day (TID) | ORAL | 11 refills | Status: DC

## 2016-08-16 MED ORDER — FUROSEMIDE 20 MG PO TABS
20.0000 mg | ORAL_TABLET | Freq: Every day | ORAL | Status: DC
  Administered 2016-08-16: 20 mg via ORAL
  Filled 2016-08-16: qty 1

## 2016-08-16 MED ORDER — SODIUM CHLORIDE 0.9 % IV SOLN
Freq: Once | INTRAVENOUS | Status: AC
  Administered 2016-08-16: 14:00:00 via INTRAVENOUS

## 2016-08-16 MED ORDER — FUROSEMIDE 20 MG PO TABS
20.0000 mg | ORAL_TABLET | Freq: Once | ORAL | Status: AC
  Administered 2016-08-16: 20 mg via ORAL
  Filled 2016-08-16: qty 1

## 2016-08-16 MED ORDER — ROSUVASTATIN CALCIUM 10 MG PO TABS
20.0000 mg | ORAL_TABLET | Freq: Every evening | ORAL | Status: DC
  Administered 2016-08-16: 20 mg via ORAL
  Filled 2016-08-16: qty 2

## 2016-08-16 MED ORDER — ASPIRIN 325 MG PO TABS: 325.0000 mg | ORAL_TABLET | Freq: Every day | ORAL | Status: DC

## 2016-08-16 MED ORDER — ROSUVASTATIN CALCIUM 20 MG PO TABS: 20.0000 mg | ORAL_TABLET | Freq: Every evening | ORAL | Status: DC

## 2016-08-16 NOTE — Discharge Instructions (Signed)
We ask the SNF to please do the following: 1. Please obtain vital signs at least one time daily 2.Please weigh the patient daily. If he or she continues to gain weight or develops lower extremity edema, contact the office at [phone]. 3. Ambulate patient at least three times daily and please use sternal precautions.  Coronary Artery Bypass Grafting, Care After This sheet gives you information about how to care for yourself after your procedure. Your health care provider may also give you more specific instructions. If you have problems or questions, contact your health care provider. What can I expect after the procedure? After the procedure, it is common to have:  Nausea and a lack of appetite.  Constipation.  Weakness and fatigue.  Depression or irritability.  Pain or discomfort in your incision areas.  Follow these instructions at home: Medicines  Take over-the-counter and prescription medicines only as told by your health care provider. Do not stop taking medicines or start any new medicines without approval from your health care provider.  If you were prescribed an antibiotic medicine, take it as told by your health care provider. Do not stop taking the antibiotic even if you start to feel better.  Do not drive or use heavy machinery while taking prescription pain medicine. Incision care  Follow instructions from your health care provider about how to take care of your incisions. Make sure you: ? Wash your hands with soap and water before you change your bandage (dressing). If soap and water are not available, use hand sanitizer. ? Change your dressing as told by your health care provider. ? Leave stitches (sutures), skin glue, or adhesive strips in place. These skin closures may need to stay in place for 2 weeks or longer. If adhesive strip edges start to loosen and curl up, you may trim the loose edges. Do not remove adhesive strips completely unless your health care  provider tells you to do that.  Keep incision areas clean, dry, and protected.  Check your incision areas every day for signs of infection. Check for: ? More redness, swelling, or pain. ? More fluid or blood. ? Warmth. ? Pus or a bad smell.  If incisions were made in your legs: ? Avoid crossing your legs. ? Avoid sitting for long periods of time. Change positions every 30 minutes. ? Raise (elevate) your legs when you are sitting. Bathing  Do not take baths, swim, or use a hot tub until your health care provider approves.  Only take sponge baths. Pat the incisions dry. Do not rub incisions with a washcloth or towel.  Ask your health care provider when you can shower. Eating and drinking  Eat foods that are high in fiber, such as raw fruits and vegetables, whole grains, beans, and nuts. Meats should be lean cut. Avoid canned, processed, and fried foods. This can help prevent constipation and is a recommended part of a heart-healthy diet.  Drink enough fluid to keep your urine clear or pale yellow.  Limit alcohol intake to no more than 1 drink a day for nonpregnant women and 2 drinks a day for men. One drink equals 12 oz of beer, 5 oz of wine, or 1 oz of hard liquor. Activity  Rest and limit your activity as told by your health care provider. You may be instructed to: ? Stop any activity right away if you have chest pain, shortness of breath, irregular heartbeats, or dizziness. Get help right away if you have any of these  symptoms. ? Move around frequently for short periods or take short walks as directed by your health care provider. Gradually increase your activities. You may need physical therapy or cardiac rehabilitation to help strengthen your muscles and build your endurance. ? Avoid lifting, pushing, or pulling anything that is heavier than 10 lb (4.5 kg) for at least 6 weeks or as told by your health care provider.  Do not drive until your health care provider  approves.  Ask your health care provider when you may return to work.  Ask your health care provider when you may resume sexual activity. General instructions  Do not use any products that contain nicotine or tobacco, such as cigarettes and e-cigarettes. If you need help quitting, ask your health care provider.  Take 2-3 deep breaths every few hours during the day, while you recover. This helps expand your lungs and prevent complications like pneumonia after surgery.  If you were given a device called an incentive spirometer, use it several times a day to practice deep breathing. Support your chest with a pillow or your arms when you take deep breaths or cough.  Wear compression stockings as told by your health care provider. These stockings help to prevent blood clots and reduce swelling in your legs.  Weigh yourself every day. This helps identify if your body is holding (retaining) fluid that may make your heart and lungs work harder.  Keep all follow-up visits as told by your health care provider. This is important. Contact a health care provider if:  You have more redness, swelling, or pain around any incision.  You have more fluid or blood coming from any incision.  Any incision feels warm to the touch.  You have pus or a bad smell coming from any incision  You have a fever.  You have swelling in your ankles or legs.  You have pain in your legs.  You gain 2 lb (0.9 kg) or more a day.  You are nauseous or you vomit.  You have diarrhea. Get help right away if:  You have chest pain that spreads to your jaw or arms.  You are short of breath.  You have a fast or irregular heartbeat.  You notice a "clicking" in your breastbone (sternum) when you move.  You have numbness or weakness in your arms or legs.  You feel dizzy or light-headed. Summary  After the procedure, it is common to have pain or discomfort in the incision areas.  Do not take baths, swim, or use a  hot tub until your health care provider approves.  Gradually increase your activities. You may need physical therapy or cardiac rehabilitation to help strengthen your muscles and build your endurance.  Weigh yourself every day. This helps identify if your body is holding (retaining) fluid that may make your heart and lungs work harder. This information is not intended to replace advice given to you by your health care provider. Make sure you discuss any questions you have with your health care provider. Document Released: 08/20/2004 Document Revised: 12/21/2015 Document Reviewed: 12/21/2015 Elsevier Interactive Patient Education  [employer].

## 2016-08-16 NOTE — Progress Notes (Addendum)
10:30am CSW informed pt not stable for DC today- getting PRBCs  CSW confirmed with SNF that they can accept patient on July 4th as long as they receive DC summary by 11am  9:30am Patient has bed at [facility] today if stable- facility request DC by 2pm today if patient to come today  CSW will continue to follow  Jorge Ny, [place] Social Worker [phone]

## 2016-08-16 NOTE — Progress Notes (Addendum)
      [address]       [place] [address]             [phone]        11 Days Post-Op Procedure(s) (LRB): CORONARY ARTERY BYPASS GRAFTING (CABG) x 3, LIMA to LAD, SVG to DIAGONAL, SVG to OM1, SVG to DISTAL RCA, USING LEFT MAMMARY ARTERY AND RIGHT GREATER SAPHENOUS VEIN HARVESTED ENDOSCOPICALLY (N/A) TRANSESOPHAGEAL ECHOCARDIOGRAM (TEE) (N/A)  Subjective: Patient without specific complaints this am.He is eating breakfast. He hopes to go to SNF soon.  Objective: Vital signs in last 24 hours: Temp:  [98.7 F (37.1 C)-98.9 F (37.2 C)] 98.9 F (37.2 C) (07/03 0527) Pulse Rate:  [73-74] 74 (07/03 0527) Cardiac Rhythm: Normal sinus rhythm (07/03 0750) Resp:  [18] 18 (07/03 0527) BP: (118-158)/(56-92) 158/56 (07/03 0527) SpO2:  [95 %-96 %] 96 % (07/03 0527) Weight:  [91.2 kg (201 lb 1 oz)] 91.2 kg (201 lb 1 oz) (07/03 0527)  Pre op weight 93.2 kg Current Weight  08/16/16 91.2 kg (201 lb 1 oz)      Intake/Output from previous day: 07/02 0701 - 07/03 0700 In: -  Out: 650 [Urine:650]   Physical Exam:  Cardiovascular: RRR Pulmonary: Mostly clear Abdomen: Soft, non tender, bowel sounds present. Extremities: Trace RLE lower extremity edema. Wounds: All wounds are clean and dry. No signs of infection.  Lab Results: CBC:  Recent Labs  08/16/16 0218  WBC 6.5  HGB 7.9*  HCT 24.4*  PLT 260   BMET:   Recent Labs  08/16/16 0218  NA 136  K 3.7  CL 107  CO2 18*  GLUCOSE 129*  BUN 62*  CREATININE 4.00*  CALCIUM 8.6*    PT/INR:  Lab Results  Component Value Date   INR 1.27 08/05/2016   INR 1.12 08/05/2016   INR 1.09 07/28/2016   ABG:  INR: Will add last result for INR, ABG once components are confirmed Will add last 4 CBG results once components are confirmed  Assessment/Plan:  1. CV - SBP remains in the 150's. On Norvasc 10 mg daily, Coreg 25 mg bid, and Bidil 2 tabs 20/37.5 mg bid. Unable to start ACE/ARB. Will discuss if should  try Clonidine or Hydralazine. 2.  Pulmonary - On room air. Encourage incentive spirometer.  3. AKI-last creatinine remains 4 this am. Appears baseline previously around 2.65, followed by Dr. Hinda Lenis in [place]);however, his creatinine upon this admission was 3.9. Nephrology following. 4.  Acute blood loss anemia - H and H decreased this am to 7.9 and 24.4. Will transfuse one unit PRBC.Will start Ferous sulfate and folic acid.Will defer to nephrology if feels needs IV iron. 5. Urinary retention-BPH. Per Dr. Alinda Money, foley to remain and patient will follow up with Dr. Louis Meckel after discharge. Do NOT remove foley. Continue Doxazosin 4 mg bid and Flomax 0.4 mg daily.  6. DM-CBGs 203/118/146. On Insulin. Pre op HGA1C 7.1. 7. Patient was declined at [facility] secondary to insurance. Will need SNF likely ready later in the week.  ZIMMERMAN,DONIELLE MPA-C 08/16/2016,8:12 AM    With persistent weakness and sob with exertion, given unit of blood today  To rehab in 1-2 days  I have seen and examined Benay Pillow and agree with the above assessment  and plan.  [name] MD Beeper [phone] Office [phone] 08/16/2016 8:17 PM

## 2016-08-16 NOTE — Progress Notes (Addendum)
HTN and chronic systolic and diastolic heart failure medication regimen at discharge:   -- norvasc 10 mg -- bidil (20-37.5) 2 tablets TID -- 20 mg lasix daily without potassium replacement - follow volume status at SNF and perform weekly BMP - coreg 25 mg BID  If he continues to be hypertensive at SNF, may increase coreg to 37.5 mg BID if HR allows.   Ledora Bottcher, PA-C 08/16/2016, 9:23 AM [place] Donnovan Stamour, MD, [facility] [phone] office [phone] pager

## 2016-08-16 NOTE — Progress Notes (Signed)
Patient ID: [name], male   DOB: [birth year], 71 y.o.   MRN: [id number] S: Feels well O:BP (!) 158/56 (BP Location: Right Arm)   Pulse 74   Temp 98.9 F (37.2 C) (Oral)   Resp 18   Ht 6\' 1"  (1.854 m)   Wt 91.2 kg (201 lb 1 oz)   SpO2 96%   BMI 26.53 kg/m   Intake/Output Summary (Last 24 hours) at 08/16/16 1115 Last data filed at 08/16/16 0529  Gross per 24 hour  Intake                0 ml  Output              650 ml  Net             -650 ml   Intake/Output: I/O last 3 completed shifts: In: -  Out: 1375 [Urine:1375]  Intake/Output this shift:  No intake/output data recorded. Weight change: 1.297 kg (2 lb 13.8 oz) Gen:WD WN AAM in NAD CVS:no rub Resp:cta ALP:FXTKWI OXB:DZHGD to 1+ pretibial edema, R>L   Recent Labs Lab 08/10/16 0423 08/11/16 0359 08/12/16 0240 08/13/16 0148 08/16/16 0218  NA 137 135 136 136 136  K 3.0* 3.9 3.5 3.7 3.7  CL 106 105 107 106 107  CO2 20* 20* 20* 19* 18*  GLUCOSE 122* 148* 136* 184* 129*  BUN 64* 65* 63* 63* 62*  CREATININE 4.29* 4.32* 4.14* 4.02* 4.00*  ALBUMIN  --   --   --   --  2.4*  CALCIUM 8.4* 8.6* 8.6* 8.8* 8.6*  PHOS  --   --   --   --  4.1   Liver Function Tests:  Recent Labs Lab 08/16/16 0218  ALBUMIN 2.4*   No results for input(s): LIPASE, AMYLASE in the last 168 hours. No results for input(s): AMMONIA in the last 168 hours. CBC:  Recent Labs Lab 08/10/16 0423 08/11/16 0359 08/16/16 0218  WBC 7.2 6.6 6.5  HGB 8.2* 8.4* 7.9*  HCT 25.5* 26.1* 24.4*  MCV 89.8 90.0 89.1  PLT 194 211 260   Cardiac Enzymes: No results for input(s): CKTOTAL, CKMB, CKMBINDEX, TROPONINI in the last 168 hours. CBG:  Recent Labs Lab 08/14/16 2200 08/15/16 0645 08/15/16 1236 08/15/16 1644 08/16/16 0629  GLUCAP 130* 150* 203* 118* 146*    Iron Studies: No results for input(s): IRON, TIBC, TRANSFERRIN, FERRITIN in the last 72 hours. Studies/Results: No results found. Marland Kitchen allopurinol  100 mg Oral Daily  . amLODipine   10 mg Oral Daily  . aspirin  325 mg Oral Daily  . carvedilol  25 mg Oral BID WC  . doxazosin  4 mg Oral BID  . enoxaparin (LOVENOX) injection  30 mg Subcutaneous Q24H  . [START ON 08/17/2016] ferrous sulfate  325 mg Oral Q breakfast  . folic acid  1 mg Oral Daily  . furosemide  20 mg Oral Daily  . insulin aspart  0-24 Units Subcutaneous TID AC & HS  . insulin aspart  3 Units Subcutaneous TID WC  . isosorbide-hydrALAZINE  2 tablet Oral TID  . levothyroxine  100 mcg Oral QAC breakfast  . lubiprostone  24 mcg Oral BID  . pantoprazole  40 mg Oral QAC breakfast  . rosuvastatin  20 mg Oral QPM  . sodium chloride flush  3 mL Intravenous Q12H  . tamsulosin  0.4 mg Oral Daily    BMET    Component Value Date/Time   NA 136 08/16/2016 9242  K 3.7 08/16/2016 0218   CL 107 08/16/2016 0218   CO2 18 (L) 08/16/2016 0218   GLUCOSE 129 (H) 08/16/2016 0218   BUN 62 (H) 08/16/2016 0218   CREATININE 4.00 (H) 08/16/2016 0218   CALCIUM 8.6 (L) 08/16/2016 0218   GFRNONAA 14 (L) 08/16/2016 0218   GFRAA 16 (L) 08/16/2016 0218   CBC    Component Value Date/Time   WBC 6.5 08/16/2016 0218   RBC 2.74 (L) 08/16/2016 0218   HGB 7.9 (L) 08/16/2016 0218   HCT 24.4 (L) 08/16/2016 0218   PLT 260 08/16/2016 0218   MCV 89.1 08/16/2016 0218   MCH 28.8 08/16/2016 0218   MCHC 32.4 08/16/2016 0218   RDW 14.8 08/16/2016 0218   LYMPHSABS 1.5 10/03/2006 1445   MONOABS 0.5 10/03/2006 1445   EOSABS 0.4 10/03/2006 1445   BASOSABS 0.0 10/03/2006 1445      Assessment/Plan:  1. AKI/CKD stage 4 (baseline Scr 2.65 in 2015 follows Dr. Hinda Lenis in [place], unknown what most recent baseline has been).  Following CABG/hypotension/ischemic ATN, ABLA as well as urinary retention/bladder obstruction.  Scr improving to 4 and stable.  No uremic symptoms.  Will need to follow up with Dr. Lowanda Foster as an outpatient.  Ok to resume po lasix 40mg  daily  2. Urinary retention- s/p foley, will need Urology follow up. 3. 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 CAD s/p CABG x 3 POD #10 5. Acute on chronic CHF- diuresing  6. Disposition- for discharge to SNF   Donetta Potts, MD [facility] [phone]

## 2016-08-16 NOTE — Progress Notes (Signed)
Cardiac rehab reports patient states he has shortness of breath and feels weak. Dr. Servando Snare states he has had anemia prior to surgery and may have been given IV iron. I am going to order 1 PRBCs. Regarding diuresis, since has been hold, will defer to nephrology.

## 2016-08-16 NOTE — Progress Notes (Signed)
Ed completed with pt. Voiced understanding. Will refer to [place]. Pt sts he feels SOB sitting in chair, which is new for him. Notified PA. Set up d/c video. [phone] Yves Dill CES, ACSM 9:47 AM 08/16/2016

## 2016-08-17 LAB — TYPE AND SCREEN
ABO/RH(D): A POS
Antibody Screen: NEGATIVE
UNIT DIVISION: 0

## 2016-08-17 LAB — RENAL FUNCTION PANEL
Albumin: 2.4 g/dL — ABNORMAL LOW (ref 3.5–5.0)
Anion gap: 9 (ref 5–15)
BUN: 60 mg/dL — AB (ref 6–20)
CHLORIDE: 107 mmol/L (ref 101–111)
CO2: 20 mmol/L — AB (ref 22–32)
Calcium: 8.7 mg/dL — ABNORMAL LOW (ref 8.9–10.3)
Creatinine, Ser: 3.84 mg/dL — ABNORMAL HIGH (ref 0.61–1.24)
GFR, EST AFRICAN AMERICAN: 17 mL/min — AB (ref >60)
GFR, EST NON AFRICAN AMERICAN: 14 mL/min — AB (ref >60)
Glucose, Bld: 133 mg/dL — ABNORMAL HIGH (ref 65–99)
POTASSIUM: 3.6 mmol/L (ref 3.5–5.1)
Phosphorus: 4.2 mg/dL (ref 2.5–4.6)
Sodium: 136 mmol/L (ref 135–145)

## 2016-08-17 LAB — BPAM RBC
BLOOD PRODUCT EXPIRATION DATE: 201807102359
ISSUE DATE / TIME: 201807031324
Unit Type and Rh: 6200

## 2016-08-17 LAB — CBC
HEMATOCRIT: 25.3 % — AB (ref 39.0–52.0)
Hemoglobin: 8.4 g/dL — ABNORMAL LOW (ref 13.0–17.0)
MCH: 29.1 pg (ref 26.0–34.0)
MCHC: 33.2 g/dL (ref 30.0–36.0)
MCV: 87.5 fL (ref 78.0–100.0)
Platelets: 243 10*3/uL (ref 150–400)
RBC: 2.89 MIL/uL — ABNORMAL LOW (ref 4.22–5.81)
RDW: 15.1 % (ref 11.5–15.5)
WBC: 7.1 10*3/uL (ref 4.0–10.5)

## 2016-08-17 LAB — GLUCOSE, CAPILLARY
GLUCOSE-CAPILLARY: 156 mg/dL — AB (ref 65–99)
GLUCOSE-CAPILLARY: 206 mg/dL — AB (ref 65–99)

## 2016-08-17 MED ORDER — CARVEDILOL 25 MG PO TABS: 37.5000 mg | ORAL_TABLET | Freq: Two times a day (BID) | ORAL | Status: DC

## 2016-08-17 MED ORDER — CARVEDILOL 12.5 MG PO TABS
12.5000 mg | ORAL_TABLET | ORAL | Status: AC
  Administered 2016-08-17: 12.5 mg via ORAL
  Filled 2016-08-17: qty 1

## 2016-08-17 MED ORDER — FUROSEMIDE 80 MG PO TABS: 40.0000 mg | ORAL_TABLET | Freq: Every day | ORAL | 2 refills | Status: DC

## 2016-08-17 MED ORDER — POTASSIUM CHLORIDE CRYS ER 10 MEQ PO TBCR
30.0000 meq | EXTENDED_RELEASE_TABLET | Freq: Once | ORAL | Status: AC
  Administered 2016-08-17: 30 meq via ORAL
  Filled 2016-08-17: qty 1

## 2016-08-17 MED ORDER — FUROSEMIDE 40 MG PO TABS: 40.0000 mg | ORAL_TABLET | Freq: Every day | ORAL | Status: DC

## 2016-08-17 MED ORDER — CARVEDILOL 25 MG PO TABS: 37.5000 mg | ORAL_TABLET | Freq: Two times a day (BID) | ORAL | 3 refills | Status: DC

## 2016-08-17 NOTE — Assessment & Plan Note (Signed)
-  LIMA to LAD -SVG to DIAGONAL -SVG to OM1 -SVG to DISTAL RCA

## 2016-08-17 NOTE — Progress Notes (Addendum)
      [address]       [place] [address]             [phone]        12 Days Post-Op Procedure(s) (LRB): CORONARY ARTERY BYPASS GRAFTING (CABG) x 3, LIMA to LAD, SVG to DIAGONAL, SVG to OM1, SVG to DISTAL RCA, USING LEFT MAMMARY ARTERY AND RIGHT GREATER SAPHENOUS VEIN HARVESTED ENDOSCOPICALLY (N/A) TRANSESOPHAGEAL ECHOCARDIOGRAM (TEE) (N/A)  Subjective: Patient without complaints this am. He is eating breakfast. He hopes to go to SNF soon.  Objective: Vital signs in last 24 hours: Temp:  [98.2 F (36.8 C)-99.1 F (37.3 C)] 99.1 F (37.3 C) (07/04 0446) Pulse Rate:  [65-76] 73 (07/04 0446) Cardiac Rhythm: Normal sinus rhythm;Bundle branch block (07/04 0700) Resp:  [16-18] 18 (07/04 0446) BP: (119-163)/(53-70) 159/61 (07/04 0446) SpO2:  [95 %-98 %] 96 % (07/04 0446)  Pre op weight 93.2 kg Current Weight  08/16/16 91.2 kg (201 lb 1 oz)      Intake/Output from previous day: 07/03 0701 - 07/04 0700 In: 51 [Blood:335] Out: 700 [Urine:700]   Physical Exam:  Cardiovascular: RRR Pulmonary: Mostly clear Abdomen: Soft, non tender, bowel sounds present. Extremities: Trace RLE lower extremity edema. Wounds: All wounds are clean and dry. No signs of infection.  Lab Results: CBC:  Recent Labs  08/16/16 0218 08/17/16 0210  WBC 6.5 7.1  HGB 7.9* 8.4*  HCT 24.4* 25.3*  PLT 260 243   BMET:   Recent Labs  08/16/16 0218 08/17/16 0210  NA 136 136  K 3.7 3.6  CL 107 107  CO2 18* 20*  GLUCOSE 129* 133*  BUN 62* 60*  CREATININE 4.00* 3.84*  CALCIUM 8.6* 8.7*    PT/INR:  Lab Results  Component Value Date   INR 1.27 08/05/2016   INR 1.12 08/05/2016   INR 1.09 07/28/2016   ABG:  INR: Will add last result for INR, ABG once components are confirmed Will add last 4 CBG results once components are confirmed  Assessment/Plan:  1. CV - SBP remains in the 150's despite titration of current medications. On Norvasc 10 mg daily, Coreg 25 mg  bid, and Bidil 2 tabs 20/37.5 mg tid. Unable to start ACE/ARB secondary to AKI. 2.  Pulmonary - On room air. Encourage incentive spirometer.  3. AKI-last creatinine slightly decreased from 4 to 3.84. Appears baseline previously around 2.65, followed by Dr. Hinda Lenis in [place]);however, his creatinine upon this admission was 3.9. Nephrology following. 4.  Acute blood loss anemia - H and H increased this am to 8.4 and 25.3 s/p transfusion.Continue Ferous sulfate and folic acid.Will defer to nephrology if feels needs IV iron. 5. Urinary retention-BPH. Per Dr. Alinda Money, foley to remain and patient will follow up with Dr. Louis Meckel after discharge. Do NOT remove foley. Continue Doxazosin 4 mg bid and Flomax 0.4 mg daily.  6. DM-CBGs 138/152/156. On Insulin. Pre op HGA1C 7.1 7. Gently supplement potassium 8. Patient was declined at [facility] secondary to insurance. Will need SNF. As discussed with Dr. Servando Snare, ready for SNF today.  ZIMMERMAN,DONIELLE MPA-C 08/17/2016,7:49 AM   Plan SNF today Will need follow up nephrology and urology appointments from SNF I have seen and examined Benay Pillow and agree with the above assessment  and plan.  [name] MD Beeper [phone] Office [phone] 08/17/2016 11:53 AM

## 2016-08-17 NOTE — Progress Notes (Signed)
Patient to be discharged to snf and will be transported via EMS IV and tele dcd. Navie Lamoreaux, Bettina Gavia RN

## 2016-08-17 NOTE — Progress Notes (Signed)
Patient will discharge to [facility] Anticipated discharge date: 7/4 Family notified: pt notified wife Transportation by [facility]- scheduled for 11:45am Report #: [phone] ask for [employer]  Bruce signing off.  Jorge Ny, LCSW Clinical Social Worker [phone]

## 2016-08-17 NOTE — Care Management Important Message (Signed)
Important Message  Patient Details  Name: KYRILLOS ADAMS MRN: [id number] Date of Birth: May 09, 1945   Medicare Important Message Given:  Yes    Nathen May 08/17/2016, 9:19 AM

## 2016-08-17 NOTE — Progress Notes (Signed)
DAILY PROGRESS NOTE   Patient Name: [name] Date of Encounter: 08/17/2016  Hospital Problem List   Principal Problem:   Acute on chronic combined systolic and diastolic CHF (congestive heart failure) (HCC) Active Problems:   Essential hypertension   BPH with obstruction/lower urinary tract symptoms   AKI (acute kidney injury) ([place])   CKD (chronic kidney disease) stage 4, GFR 15-29 ml/min (HCC)   Elevated troponin   Hypothyroidism   Dyslipidemia   Diabetes mellitus, insulin-dependent (IDDM or type I) ([facility])   NSTEMI (non-ST elevated myocardial infarction) ([place])   Acute pulmonary edema (HCC)   Hypoxemia   Acute respiratory failure with hypoxia (HCC)   CHF (congestive heart failure) (HCC)   Hx of CABG   S/P CABG x 4   Diabetes mellitus type 2 in nonobese (HCC)   Benign essential HTN   Acute blood loss anemia    Chief Complaint   No complaints  Subjective   Mr. Spellman is will likely need SNF - BP Remains elevated.  Objective   Vitals:   08/16/16 1631 08/16/16 2015 08/16/16 2100 08/17/16 0446  BP: (!) 163/70 (!) 154/63 (!) 158/62 (!) 159/61  Pulse: 76 73 73 73  Resp: _0 Temp: 98.4 F (36.9 C) 98.9 F (37.2 C) 99 F (37.2 C) 99.1 F (37.3 C)  TempSrc: Oral Oral Oral Oral  SpO2:  97% 98% 96%  Weight:      Height:        Intake/Output Summary (Last 24 hours) at 08/17/16 0836 Last data filed at 08/17/16 0700  Gross per 24 hour  Intake              335 ml  Output              700 ml  Net             -365 ml   Filed Weights   08/14/16 0535 08/15/16 0536 08/16/16 0527  Weight: 198 lb 1.6 oz (89.9 kg) 198 lb 3.2 oz (89.9 kg) 201 lb 1 oz (91.2 kg)    Physical Exam   General appearance: alert and no distress Neck: no carotid bruit and no JVD Lungs: clear to auscultation bilaterally Heart: regular rate and rhythm Abdomen: soft, non-tender; bowel sounds normal; no masses,  no organomegaly Extremities: extremities normal, atraumatic, no cyanosis  or edema Pulses: 2+ and symmetric Skin: Skin color, texture, turgor normal. No rashes or lesions Neurologic: Grossly normal Psych: Pleasant  Inpatient Medications    Scheduled Meds: . allopurinol  100 mg Oral Daily  . amLODipine  10 mg Oral Daily  . aspirin  325 mg Oral Daily  . carvedilol  25 mg Oral BID WC  . doxazosin  4 mg Oral BID  . enoxaparin (LOVENOX) injection  30 mg Subcutaneous Q24H  . ferrous sulfate  325 mg Oral Q breakfast  . folic acid  1 mg Oral Daily  . furosemide  40 mg Oral Daily  . insulin aspart  0-24 Units Subcutaneous TID AC & HS  . insulin aspart  3 Units Subcutaneous TID WC  . isosorbide-hydrALAZINE  2 tablet Oral TID  . levothyroxine  100 mcg Oral QAC breakfast  . lubiprostone  24 mcg Oral BID  . pantoprazole  40 mg Oral QAC breakfast  . rosuvastatin  20 mg Oral QPM  . sodium chloride flush  3 mL Intravenous Q12H  . tamsulosin  0.4 mg Oral Daily    Continuous  Infusions: . sodium chloride      PRN Meds: sodium chloride, acetaminophen, bisacodyl **OR** bisacodyl, ondansetron **OR** ondansetron (ZOFRAN) IV, oxyCODONE, sodium chloride flush, traMADol   Labs   Results for orders placed or performed during the hospital encounter of 07/25/16 (from the past 48 hour(s))  Magnesium     Status: None   Collection Time: 08/15/16  9:46 AM  Result Value Ref Range   Magnesium 2.2 1.7 - 2.4 mg/dL  Glucose, capillary     Status: Abnormal   Collection Time: 08/15/16 12:36 PM  Result Value Ref Range   Glucose-Capillary 203 (H) 65 - 99 mg/dL  Glucose, capillary     Status: Abnormal   Collection Time: 08/15/16  4:44 PM  Result Value Ref Range   Glucose-Capillary 118 (H) 65 - 99 mg/dL  CBC     Status: Abnormal   Collection Time: 08/16/16  2:18 AM  Result Value Ref Range   WBC 6.5 4.0 - 10.5 K/uL   RBC 2.74 (L) 4.22 - 5.81 MIL/uL   Hemoglobin 7.9 (L) 13.0 - 17.0 g/dL   HCT 24.4 (L) 39.0 - 52.0 %   MCV 89.1 78.0 - 100.0 fL   MCH 28.8 26.0 - 34.0 pg   MCHC  32.4 30.0 - 36.0 g/dL   RDW 14.8 11.5 - 15.5 %   Platelets 260 150 - 400 K/uL  Renal function panel     Status: Abnormal   Collection Time: 08/16/16  2:18 AM  Result Value Ref Range   Sodium 136 135 - 145 mmol/L   Potassium 3.7 3.5 - 5.1 mmol/L   Chloride 107 101 - 111 mmol/L   CO2 18 (L) 22 - 32 mmol/L   Glucose, Bld 129 (H) 65 - 99 mg/dL   BUN 62 (H) 6 - 20 mg/dL   Creatinine, Ser 4.00 (H) 0.61 - 1.24 mg/dL   Calcium 8.6 (L) 8.9 - 10.3 mg/dL   Phosphorus 4.1 2.5 - 4.6 mg/dL   Albumin 2.4 (L) 3.5 - 5.0 g/dL   GFR calc non Af Amer 14 (L) >60 mL/min   GFR calc Af Amer 16 (L) >60 mL/min    Comment: (NOTE) The eGFR has been calculated using the CKD EPI equation. This calculation has not been validated in all clinical situations. eGFR's persistently <60 mL/min signify possible Chronic Kidney Disease.    Anion gap 11 5 - 15  Glucose, capillary     Status: Abnormal   Collection Time: 08/16/16  6:29 AM  Result Value Ref Range   Glucose-Capillary 146 (H) 65 - 99 mg/dL   Comment 1 Notify RN    Comment 2 Document in Chart   Prepare RBC     Status: None   Collection Time: 08/16/16 10:56 AM  Result Value Ref Range   Order Confirmation ORDER PROCESSED BY BLOOD BANK   Type and screen [facility]     Status: None (Preliminary result)   Collection Time: 08/16/16 10:56 AM  Result Value Ref Range   ABO/RH(D) A POS    Antibody Screen NEG    Sample Expiration 08/19/2016    Unit Number [id number]    Blood Component Type RED CELLS,LR    Unit division 00    Status of Unit ISSUED    Transfusion Status OK TO TRANSFUSE    Crossmatch Result Compatible   Glucose, capillary     Status: Abnormal   Collection Time: 08/16/16 11:47 AM  Result Value Ref Range   Glucose-Capillary 207 (  H) 65 - 99 mg/dL  Glucose, capillary     Status: Abnormal   Collection Time: 08/16/16  4:13 PM  Result Value Ref Range   Glucose-Capillary 138 (H) 65 - 99 mg/dL  Glucose, capillary     Status:  Abnormal   Collection Time: 08/16/16  9:34 PM  Result Value Ref Range   Glucose-Capillary 152 (H) 65 - 99 mg/dL  CBC     Status: Abnormal   Collection Time: 08/17/16  2:10 AM  Result Value Ref Range   WBC 7.1 4.0 - 10.5 K/uL   RBC 2.89 (L) 4.22 - 5.81 MIL/uL   Hemoglobin 8.4 (L) 13.0 - 17.0 g/dL   HCT 25.3 (L) 39.0 - 52.0 %   MCV 87.5 78.0 - 100.0 fL   MCH 29.1 26.0 - 34.0 pg   MCHC 33.2 30.0 - 36.0 g/dL   RDW 15.1 11.5 - 15.5 %   Platelets 243 150 - 400 K/uL  Renal function panel     Status: Abnormal   Collection Time: 08/17/16  2:10 AM  Result Value Ref Range   Sodium 136 135 - 145 mmol/L   Potassium 3.6 3.5 - 5.1 mmol/L   Chloride 107 101 - 111 mmol/L   CO2 20 (L) 22 - 32 mmol/L   Glucose, Bld 133 (H) 65 - 99 mg/dL   BUN 60 (H) 6 - 20 mg/dL   Creatinine, Ser 3.84 (H) 0.61 - 1.24 mg/dL   Calcium 8.7 (L) 8.9 - 10.3 mg/dL   Phosphorus 4.2 2.5 - 4.6 mg/dL   Albumin 2.4 (L) 3.5 - 5.0 g/dL   GFR calc non Af Amer 14 (L) >60 mL/min   GFR calc Af Amer 17 (L) >60 mL/min    Comment: (NOTE) The eGFR has been calculated using the CKD EPI equation. This calculation has not been validated in all clinical situations. eGFR's persistently <60 mL/min signify possible Chronic Kidney Disease.    Anion gap 9 5 - 15  Glucose, capillary     Status: Abnormal   Collection Time: 08/17/16  6:28 AM  Result Value Ref Range   Glucose-Capillary 156 (H) 65 - 99 mg/dL    ECG   N/A  Telemetry   Sinus rhythm - Personally Reviewed  Radiology    No results found.  Cardiac Studies   N/A  Assessment   Principal Problem:   Acute on chronic combined systolic and diastolic CHF (congestive heart failure) (HCC) Active Problems:   Essential hypertension   BPH with obstruction/lower urinary tract symptoms   AKI (acute kidney injury) (HCC)   CKD (chronic kidney disease) stage 4, GFR 15-29 ml/min (HCC)   Elevated troponin   Hypothyroidism   Dyslipidemia   Diabetes mellitus,  insulin-dependent (IDDM or type I) (HCC)   NSTEMI (non-ST elevated myocardial infarction) ([place])   Acute pulmonary edema (HCC)   Hypoxemia   Acute respiratory failure with hypoxia (HCC)   CHF (congestive heart failure) (HCC)   Hx of CABG   S/P CABG x 4   Diabetes mellitus type 2 in nonobese (HCC)   Benign essential HTN   Acute blood loss anemia   Plan   Plan for SNF - BP remains elevated. Recommend increasing coreg to 37.5 mg BID.  Time Spent Directly with Patient:  I have spent a total of 15 minutes with the patient reviewing hospital notes, telemetry, EKGs, labs and examining the patient as well as establishing an assessment and plan that was discussed personally with the patient. >  50% of time was spent in direct patient care.  Length of Stay:  LOS: 22 days   Pixie Casino, MD, [place]  Attending Cardiologist  Direct Dial: [phone]  Fax: [phone]  Website:  [email or web address] 08/17/2016, 8:36 AM

## 2016-08-17 NOTE — Care Management Note (Signed)
Case Management Note Marvetta Gibbons RN, BSN Unit 2W-Case Manager-- [place] coverage [phone]  Patient Details  Name: [name] MRN: [id number] Date of Birth: Jun 23, 1945  Subjective/Objective:  Pt admitted with acute on Chronic HF and NSTEMI from outside hospital - aggressive diuresis                 Action/Plan: PTA pt lived at home with spouse- plan for cardiac cath on 6/14- CM to follow for  d/c needs-- per PT eval recommendations for HHPT and DME-RW and 3n1- will need orders prior to discharge.   Expected Discharge Date:  08/17/16               Expected Discharge Plan:  Skilled Nursing Facility  In-House Referral:  Clinical Social Work  Discharge planning Services  CM Consult  Post Acute Care Choice:  Home Health, Durable Medical Equipment Choice offered to:     DME Arranged:    DME Agency:     HH Arranged:    [place] Agency:     Status of Service:  Completed, signed off  If discussed at [employer], dates discussed:  7/3  Discharge Disposition: skilled facility   Additional Comments:  08/17/16- 1045- Forever Arechiga RN, CM- post op CABG pt will need rehab- was denied by insurance for CIR- plan is for SNF- CSW has been following for SNF placement- pt stable for d/c today- plan is for [facility] SNF- CSW assisting.   08/12/16- 1600- Iyanla Eilers RN, CM- updated recommendations for CIR- will follow to see if pt appropriate and insurance approved- will need SNF as backup.   08/09/16- 1130- Marvetta Gibbons RN, CM- pt s/p CABGx3 on 08/05/16- CM to continue to follow for d/c needs- anticipate [facility].   Dawayne Patricia, RN 08/17/2016, 10:57 AM

## 2016-08-17 NOTE — Clinical Social Work Placement (Signed)
   CLINICAL SOCIAL WORK PLACEMENT  NOTE  Date:  08/17/2016  Patient Details  Name: [name] MRN: [id number] Date of Birth: 11/28/1945  Clinical Social Work is seeking post-discharge placement for this patient at the [place] level of care (*CSW will initial, date and re-position this form in  chart as items are completed):  Yes   Patient/family provided with [place] Work Department's list of facilities offering this level of care within the geographic area requested by the patient (or if unable, by the patient's family).  Yes   Patient/family informed of their freedom to choose among providers that offer the needed level of care, that participate in Medicare, Medicaid or managed care program needed by the patient, have an available bed and are willing to accept the patient.  Yes   Patient/family informed of [facility]'s ownership interest in [facility] and [facility], as well as of the fact that they are under no obligation to receive care at these facilities.  PASRR submitted to EDS on 08/15/16     PASRR number received on 08/15/16     Existing PASRR number confirmed on       FL2 transmitted to all facilities in geographic area requested by pt/family on 08/15/16     FL2 transmitted to all facilities within larger geographic area on       Patient informed that his/her managed care company has contracts with or will negotiate with certain facilities, including the following:        Yes   Patient/family informed of bed offers received.  Patient chooses bed at [facility]     Physician recommends and patient chooses bed at      Patient to be transferred to [facility] on 08/17/16.  Patient to be transferred to facility by ptar     Patient family notified on 08/17/16 of transfer.  Name of family member notified:  annette     PHYSICIAN Please sign FL2     Additional Comment:     _______________________________________________ Jorge Ny, LCSW 08/17/2016, 11:10 AM

## 2016-09-16 ENCOUNTER — Other Ambulatory Visit: Payer: Self-pay | Admitting: Cardiothoracic Surgery

## 2016-09-16 DIAGNOSIS — Z951 Presence of aortocoronary bypass graft: Secondary | ICD-10-CM

## 2016-09-19 ENCOUNTER — Ambulatory Visit (INDEPENDENT_AMBULATORY_CARE_PROVIDER_SITE_OTHER): Payer: Self-pay | Admitting: Physician Assistant

## 2016-09-19 ENCOUNTER — Ambulatory Visit
Admission: RE | Admit: 2016-09-19 | Discharge: 2016-09-19 | Disposition: A | Discharge status: Home / Self Care | Payer: Medicare Other | Source: Ambulatory Visit | Attending: Cardiothoracic Surgery | Admitting: Cardiothoracic Surgery

## 2016-09-19 VITALS — BP 181/73 | HR 78 | Resp 16 | Ht 73.0 in | Wt 202.6 lb

## 2016-09-19 DIAGNOSIS — J9601 Acute respiratory failure with hypoxia: Secondary | ICD-10-CM

## 2016-09-19 DIAGNOSIS — E119 Type 2 diabetes mellitus without complications: Secondary | ICD-10-CM

## 2016-09-19 DIAGNOSIS — I2510 Atherosclerotic heart disease of native coronary artery without angina pectoris: Secondary | ICD-10-CM

## 2016-09-19 DIAGNOSIS — Z951 Presence of aortocoronary bypass graft: Secondary | ICD-10-CM

## 2016-09-19 DIAGNOSIS — N184 Chronic kidney disease, stage 4 (severe): Secondary | ICD-10-CM

## 2016-09-19 MED ORDER — ISOSORB DINITRATE-HYDRALAZINE 20-37.5 MG PO TABS: 2.0000 | ORAL_TABLET | Freq: Three times a day (TID) | ORAL | Status: DC

## 2016-09-19 MED ORDER — ISOSORB DINITRATE-HYDRALAZINE 20-37.5 MG PO TABS: 2.0000 | ORAL_TABLET | Freq: Three times a day (TID) | ORAL | 1 refills | Status: DC

## 2016-09-19 NOTE — Patient Instructions (Addendum)
Make every effort to keep your diabetes under very tight control.  Follow up closely with your primary care physician or endocrinologist and strive to keep their hemoglobin A1c levels as low as possible, preferably near or below 6.0.  The long term benefits of strict control of diabetes are far reaching and critically important for your overall health and survival.  You are encouraged to enroll and participate in the outpatient cardiac rehab program beginning as soon as practical.  You may return to driving an automobile as long as you are no longer requiring oral narcotic pain relievers during the daytime.  It would be wise to start driving only short distances during the daylight and gradually increase from there as you feel comfortable.  Make every effort to maintain a "heart-healthy" lifestyle with regular physical exercise and adherence to a low-fat, low-carbohydrate diet.  Continue to seek regular follow-up appointments with your primary care physician and/or cardiologist.  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

## 2016-09-19 NOTE — Progress Notes (Addendum)
[name] is a 71 y.o. male patient S/P cabg x 3.   1. CAD, multiple vessel   2. S/P CABG x 3   3. Diabetes mellitus type 2 in nonobese (HCC) Chronic  4. Acute respiratory failure with hypoxia (HCC) Chronic  5. CKD (chronic kidney disease) stage 4, GFR 15-29 ml/min (HCC) Chronic   Past Medical History:  Diagnosis Date  . CAD (coronary artery disease), native coronary artery    s/p NSTEMI and CABG x 3 (LIMA to LAD, SVG to diagonal, SVG to dRCA) on 08/05/16  . Carotid artery occlusion   . Chronic kidney disease   . Diabetes mellitus without complication ([place])   . Hypertension    No past surgical history pertinent negatives on file. Scheduled Meds: Current Outpatient Prescriptions on File Prior to Visit  Medication Sig Dispense Refill  . acetaminophen (TYLENOL) 325 MG tablet Take 2 tablets (650 mg total) by mouth every 6 (six) hours as needed for mild pain.    Marland Kitchen allopurinol (ZYLOPRIM) 100 MG tablet Take 100 mg by mouth daily.  3  . AMITIZA 24 MCG capsule Take 24 mcg by mouth 2 (two) times daily.  5  . amLODipine (NORVASC) 10 MG tablet Take 10 mg by mouth daily.  2  . aspirin 325 MG tablet Take 1 tablet (325 mg total) by mouth daily.    . betamethasone valerate (VALISONE) 0.1 % cream Apply 1 application topically daily.   99  . carvedilol (COREG) 25 MG tablet Take 1.5 tablets (37.5 mg total) by mouth 2 (two) times daily.  3  . [employer] VITAMIN D 2000 units CAPS Take 1 capsule by mouth daily.  5  . doxazosin (CARDURA) 4 MG tablet Take 4 mg by mouth 2 (two) times daily.  2  . ferrous sulfate 325 (65 FE) MG tablet Take 1 tablet (325 mg total) by mouth daily with breakfast.  3  . fluticasone (FLONASE) 50 MCG/ACT nasal spray USE 2 SPRAYS IN EACH NOSTRIL AT BEDTIME  9  . folic acid (FOLVITE) 1 MG tablet Take 1 tablet (1 mg total) by mouth daily.    . furosemide (LASIX) 40 MG tablet Take 1 tablet (40 mg total) by mouth daily.    Marland Kitchen HUMULIN 70/30 (70-30) 100 UNIT/ML injection Inject 5-10 Units  into the skin 2 (two) times daily with a meal.   9  . levothyroxine (SYNTHROID, LEVOTHROID) 100 MCG tablet Take 100 mcg by mouth daily.    . rosuvastatin (CRESTOR) 20 MG tablet Take 1 tablet (20 mg total) by mouth every evening.    . sodium bicarbonate 650 MG tablet Take 650 mg by mouth 2 (two) times daily.  4  . tamsulosin (FLOMAX) 0.4 MG CAPS capsule Take 0.8 mg by mouth every evening.  9   No current facility-administered medications on file prior to visit.     Allergies  Allergen Reactions  . No Known Allergies    Active Problems:   * No active hospital problems. *  Blood pressure (!) 181/73, pulse 78, resp. rate 16, height 6\' 1"  (1.854 m), weight 91.9 kg (202 lb 9.6 oz), SpO2 99 %.  Subjective: Over All he feels quite well. Occasionally he has some shortness of breath when walking longer distances. He does not have any sternal discomfort but occasionally gets sharp pains on his left chest wall. He is only taking Tylenol for pain at this point.  Objective: Cor: RRR, no murmur Pulm: CTA bilaterally Abd: soft, non-tender Ext: 2+  pitting edema R > L Wound: EVH sites well healed. Sternal incision well healed.  CLINICAL DATA:  CABG  EXAM: CHEST  2 VIEW  COMPARISON:  08/28/2016  FINDINGS: The heart is mildly enlarged. Mild vascular congestion. Mild airspace disease at the posterior left lung base associated with a small left pleural effusion. These findings have improved. Postop changes. No pneumothorax.  IMPRESSION: Improved left pleural effusion and basilar airspace disease.  No pneumothorax.  Vascular congestion without interstitial edema.   Electronically Signed   By: [name] M.D.   On: 09/19/2016 13:12  Assessment & Plan  Overall [name] is doing quite well. He can increase his activity level as tolerated and do more walking at this point. I encouraged him to continue walking with his cane due to balance issues. 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 He can start driving an automobile since he is no longer requiring narcotic pain medications. He also asked about playing his guitar which I suggested waiting another week to do so. He also asked about sexual intercourse. I suggested that he remained on the bottom and not engage in pushup positions. I also encouraged him to not strain himself or exert himself too much. I encouraged him to follow up with his nephrologist due to his chronic kidney disease. I also reached out to [name] via our messaging system to arrange follow-up with Dr. Debara Pickett since he had not had a cardiology follow-up since discharge. His blood pressure was elevated today in the office and therefore I increased  his BiDil. Occasionally he does take his blood pressure at home and I suggested that he take it more frequently since we are increasing one of his medications today. He stated that he did have a way to take it more frequently at home and plans to do so. I asked him to write down his blood pressure readings for the next couple days so that we may titrate his medication appropriately. He has not had any issues with urination since being on the Flomax. I have discharged him from our practice at this time since he is healing quite well surgically. I informed him that he may call our office at any time if any questions or concerns arise. He had no further questions at this time. Chest xray reviewed with the patient  [name] 09/19/2016

## 2016-09-22 ENCOUNTER — Encounter: Payer: Medicare Other | Admitting: Cardiothoracic Surgery

## 2016-09-28 ENCOUNTER — Ambulatory Visit: Payer: Medicare Other | Admitting: Cardiovascular Disease

## 2016-09-29 ENCOUNTER — Ambulatory Visit (INDEPENDENT_AMBULATORY_CARE_PROVIDER_SITE_OTHER): Payer: Medicare Other | Admitting: Cardiovascular Disease

## 2016-09-29 ENCOUNTER — Encounter: Payer: Self-pay | Admitting: Cardiovascular Disease

## 2016-09-29 VITALS — BP 166/78 | HR 65 | Ht 73.0 in | Wt 206.0 lb

## 2016-09-29 DIAGNOSIS — I5043 Acute on chronic combined systolic (congestive) and diastolic (congestive) heart failure: Secondary | ICD-10-CM | POA: Diagnosis not present

## 2016-09-29 DIAGNOSIS — M7989 Other specified soft tissue disorders: Secondary | ICD-10-CM

## 2016-09-29 DIAGNOSIS — I110 Hypertensive heart disease with heart failure: Secondary | ICD-10-CM

## 2016-09-29 DIAGNOSIS — Z951 Presence of aortocoronary bypass graft: Secondary | ICD-10-CM

## 2016-09-29 DIAGNOSIS — I5042 Chronic combined systolic (congestive) and diastolic (congestive) heart failure: Secondary | ICD-10-CM | POA: Diagnosis not present

## 2016-09-29 MED ORDER — CLONIDINE HCL 0.1 MG PO TABS: 0.1000 mg | ORAL_TABLET | Freq: Three times a day (TID) | ORAL | 5 refills | Status: DC

## 2016-09-29 NOTE — Progress Notes (Signed)
Cardiology Office Note   Date:  09/29/2016   ID:  [name], DOB Aug 27, 1945, MRN [id number]  PCP:  Foye Spurling, MD  Cardiologist:   Dr. Lyman Bishop  No chief complaint on file.   History of Present Illness: [name] is a 71 y.o. male with CAD status post CABG, diabetes, hypertension, chronic systolic and diastolic heart failure, hyperlipidemia, CKD 4 who presents for follow up.  [name] presented to the hospital 07/2016 with NSTEMI.  He initially presented with aggressive dyspnea and required BiPAP. BNP was 19,000. Troponin peaked at 6.14.  He was found to have multivessel CAD cath.  Echo revealed LVEF 40-45% with severe hypokinesis of the mid to distal inferior, apical, lateral, and apical septal walls. He had grade 1 diastolic dysfunction. He required diuresis prior to surgery. During that hospitalization [name] underwent 4 vessel CABG (LIMA-->LAD, SVG-->D, SVG-->OM1 and SVG-->distal RCA).  he followed up in [place] surgery clinic on 8/6 and was doing well.   [name] has been feeling well. He continues to have some soreness in his chest that he attributes to post incisional pain. He has no exertional symptoms. He's been feeling very weak ever since he left the hospital. He tries to walk and his yard each morning. He is limited by weakness in his legs and leg pain. He did have one fall. He slipped out of his house she isn't sure. There is no preceding chest pain, shortness of breath, or palpitations.  He has noted right lower extremity edema. He denies pain, orthopnea, or PND. He brings a log of his blood pressures today that show they have been running high blood pressure this morning was 202 before taking his medication.  This improved to the 160s after taking his meds.    Past Medical History:  Diagnosis Date  . CAD (coronary artery disease), native coronary artery    s/p NSTEMI and CABG x 3 (LIMA to LAD, SVG to diagonal, SVG to dRCA) on 08/05/16  . Carotid artery  occlusion   . Chronic kidney disease   . Diabetes mellitus without complication ([place])   . Hypertension     Past Surgical History:  Procedure Laterality Date  . CORONARY ARTERY BYPASS GRAFT N/A 08/05/2016   Procedure: CORONARY ARTERY BYPASS GRAFTING (CABG) x 3, LIMA to LAD, SVG to DIAGONAL, SVG to OM1, SVG to DISTAL RCA, USING LEFT MAMMARY ARTERY AND RIGHT GREATER SAPHENOUS VEIN HARVESTED ENDOSCOPICALLY;  Surgeon: [name], MD;  Location: [place];  Service: Open Heart Surgery;  Laterality: N/A;  . LEFT HEART CATH AND CORONARY ANGIOGRAPHY N/A 07/28/2016   Procedure: Left Heart Cath and Coronary Angiography;  Surgeon: Jettie Booze, MD;  Location: [facility];  Service: Cardiovascular;  Laterality: N/A;  . LUMBAR FUSION  2005  . TEE WITHOUT CARDIOVERSION N/A 08/05/2016   Procedure: TRANSESOPHAGEAL ECHOCARDIOGRAM (TEE);  Surgeon: [name], MD;  Location: [place];  Service: Open Heart Surgery;  Laterality: N/A;     Current Outpatient Prescriptions  Medication Sig Dispense Refill  . acetaminophen (TYLENOL) 325 MG tablet Take 2 tablets (650 mg total) by mouth every 6 (six) hours as needed for mild pain.    Marland Kitchen allopurinol (ZYLOPRIM) 100 MG tablet Take 100 mg by mouth daily.  3  . AMITIZA 24 MCG capsule Take 24 mcg by mouth 2 (two) times daily.  5  . amLODipine (NORVASC) 10 MG tablet Take 10 mg by mouth daily.  2  .  aspirin 325 MG tablet Take 1 tablet (325 mg total) by mouth daily.    . betamethasone valerate (VALISONE) 0.1 % cream Apply 1 application topically daily.   99  . carvedilol (COREG) 25 MG tablet Take 1.5 tablets (37.5 mg total) by mouth 2 (two) times daily.  3  . cloNIDine (CATAPRES) 0.1 MG tablet Take 1 tablet (0.1 mg total) by mouth 3 (three) times daily. 90 tablet 5  . [facility] VITAMIN D 2000 units CAPS Take 1 capsule by mouth daily.  5  . doxazosin (CARDURA) 4 MG tablet Take 4 mg by mouth 2 (two) times daily.  2  . ferrous sulfate 325 (65 FE) MG tablet Take 1  tablet (325 mg total) by mouth daily with breakfast.  3  . fluticasone (FLONASE) 50 MCG/ACT nasal spray USE 2 SPRAYS IN EACH NOSTRIL AT BEDTIME  9  . furosemide (LASIX) 40 MG tablet Take 1 tablet (40 mg total) by mouth daily.    Marland Kitchen HUMULIN 70/30 (70-30) 100 UNIT/ML injection Inject 5-10 Units into the skin 2 (two) times daily with a meal.   9  . isosorbide-hydrALAZINE (BIDIL) 20-37.5 MG tablet Take 2 tablets by mouth 3 (three) times daily. 180 tablet 1  . levothyroxine (SYNTHROID, LEVOTHROID) 100 MCG tablet Take 100 mcg by mouth daily.    . rosuvastatin (CRESTOR) 20 MG tablet Take 1 tablet (20 mg total) by mouth every evening.    . sodium bicarbonate 650 MG tablet Take 650 mg by mouth 2 (two) times daily.  4  . tamsulosin (FLOMAX) 0.4 MG CAPS capsule Take 0.8 mg by mouth every evening.  9   No current facility-administered medications for this visit.     Allergies:   Sulfa antibiotics    Social History:  The patient  reports that he has quit smoking. He has never used smokeless tobacco. He reports that he does not drink alcohol or use drugs.   Family History:  The patient's family history includes Heart disease in his mother; Hypertension in his father and mother.    ROS:  Please see the history of present illness.   Otherwise, review of systems are positive for none.   All other systems are reviewed and negative.    PHYSICAL EXAM: VS:  BP (!) 166/78   Pulse 65   Ht 6\' 1"  (1.854 m)   Wt 93.4 kg (206 lb)   BMI 27.18 kg/m  , BMI Body mass index is 27.18 kg/m. GENERAL:  Well appearing HEENT:  Pupils equal round and reactive, fundi not visualized, oral mucosa unremarkable NECK:  No jugular venous distention, waveform within normal limits, carotid upstroke brisk and symmetric, bilateral bruits CHEST: Well-healed midline sternal incision LUNGS:  Clear to auscultation bilaterally HEART:  RRR.  PMI not displaced or sustained,S1 and S2 within normal limits, no S3, no S4, no clicks, no  rubs, no murmurs ABD:  Flat, positive bowel sounds normal in frequency in pitch, no bruits, no rebound, no guarding, no midline pulsatile mass, no hepatomegaly, no splenomegaly EXT:  2 plus pulses throughout, 2+ pitting edema to the upper R tibia, no cyanosis no clubbing SKIN:  No rashes no nodules NEURO:  Cranial nerves II through XII grossly intact, motor grossly intact throughout PSYCH:  Cognitively intact, oriented to person place and time   EKG:  EKG is not ordered today.  Echo 07/25/16: Study Conclusions  - Left ventricle: The cavity size was normal. There was moderate   concentric hypertrophy. Systolic function was mildly  to   moderately reduced. The estimated ejection fraction was in the   range of 40% to 45%. Mid to distal inferior, apical, lateral and   apical septal severe hypokinesis. Doppler parameters are   consistent with abnormal left ventricular relaxation (grade 1   diastolic dysfunction). The E/e&' ratio is >15, suggesting   elevated LV filling pressure. - Aortic valve: Sclerosis without stenosis. There was no   regurgitation. - Mitral valve: Mildly thickened leaflets . There was trivial   regurgitation. - Left atrium: The atrium was normal in size. - Tricuspid valve: There was mild regurgitation. - Pulmonary arteries: PA peak pressure: 40 mm Hg (S). - Inferior vena cava: The vessel was dilated. The respirophasic   diameter changes were blunted (< 50%), consistent with elevated   central venous pressure.  LHC 07/28/16:  LM lesion, 75 %stenosed.  Ost Cx lesion, 60 %stenosed.  Ost LAD lesion, 60 %stenosed.  Mid LAD lesion, 90 %stenosed.  Ost 2nd Diag to 2nd Diag lesion, 95 %stenosed.  Mid Cx to Dist Cx lesion, 25 %stenosed.  Prox RCA to Mid RCA lesion, 100 %stenosed.  LV end diastolic pressure is moderately elevated.  There is no aortic valve stenosis.  No ventriculogram done to minimize contrast exposure.   Severe multivessel CAD.  Plan for  cardiac surgery consult.  Continue diuresis as renal function allows  ABI 07/29/16: R 0.54, L 0.51  Recent Labs: 07/25/2016: TSH 0.565 08/04/2016: ALT 37 08/15/2016: Magnesium 2.2 08/17/2016: BUN 60; Creatinine, Ser 3.84; Hemoglobin 8.4; Platelets 243; Potassium 3.6; Sodium 136    Lipid Panel    Component Value Date/Time   CHOL 99 07/26/2016 0022   TRIG 80 07/26/2016 0022   HDL 38 (L) 07/26/2016 0022   CHOLHDL 2.6 07/26/2016 0022   VLDL 16 07/26/2016 0022   LDLCALC 45 07/26/2016 0022      Wt Readings from Last 3 Encounters:  09/29/16 93.4 kg (206 lb)  09/19/16 91.9 kg (202 lb 9.6 oz)  08/16/16 91.2 kg (201 lb 1 oz)      ASSESSMENT AND PLAN:  # CAD s/p CABG:  [name] is doing well and has no exertional chest pain.  He stopped taking aspirin beause he was told that it interfered with levothyroxine.  We discussed the importance of taking this medication. He will restart aspirin 325 mg daily for 3 months after his surgery. Then he can switch to 81 mg daily. Continue carvedilol,Bidil and rosuvastatin.  # Hypertensive heart disease:  Blood pressure is poorly-controlled. His options are limited by CKD IV.  Continue amlodipine, carvedilol,doxazosin, and Bidil.  We will add clonidine 0.1mg  tid.  # Hyperlipidemia:  Continue rosuvastatin.    # R LE edema: Check LE Dopplers  # Chronic systolic and diastolic heart failure: He appears euvolemic other than R L edema.  Medical management as above.  Continue lasix.   # PAD: Continue aspirin and statin.    Current medicines are reviewed at length with the patient today.  The patient does not have concerns regarding medicines.  The following changes have been made:  Restart aspirin 325mg  daily.  Add clonidine 0.1mg  tid.   Labs/ tests ordered today include:  No orders of the defined types were placed in this encounter.    Disposition:   FU with Dr. Debara Pickett in one month    This note was written with the assistance of speech recognition  software.  Please excuse any transcriptional errors.  Signed, Slaton Reaser C. Oval Linsey, MD, [facility]  09/29/2016  2:41 PM    [facility]

## 2016-09-29 NOTE — Patient Instructions (Signed)
Medication Instructions:  START CLONIDINE 0.1 MG THREE TIMES A DAY  START BACK ON THE ASPIRIN 325 MG DAILY FOR 1 MORE MONTH AND THEN DECREASE TO 81 MG DAILY   MAKE SURE YOU TAKE YOUR LEVOTHYROXINE FIRST THING IN THE MORNING 2 HOURS BEFORE OTHER MEDICATIONS   Labwork: NONE  Testing/Procedures: Your physician has requested that you have a lower or upper extremity venous duplex. This test is an ultrasound of the veins in the legs or arms. It looks at venous blood flow that carries blood from the heart to the legs or arms. Allow one hour for a Lower Venous exam. Allow thirty minutes for an Upper Venous exam. There are no restrictions or special instructions. SOON   Follow-Up: Your physician recommends that you schedule a follow-up appointment in: [place]    If you need a refill on your cardiac medications before your next appointment, please call your pharmacy.

## 2016-10-11 ENCOUNTER — Ambulatory Visit (HOSPITAL_COMMUNITY)
Admission: RE | Admit: 2016-10-11 | Discharge: 2016-10-11 | Disposition: A | Discharge status: Home / Self Care | Payer: Medicare Other | Source: Ambulatory Visit | Attending: Cardiovascular Disease | Admitting: Cardiovascular Disease

## 2016-10-11 DIAGNOSIS — I2510 Atherosclerotic heart disease of native coronary artery without angina pectoris: Secondary | ICD-10-CM | POA: Insufficient documentation

## 2016-10-11 DIAGNOSIS — Z87891 Personal history of nicotine dependence: Secondary | ICD-10-CM | POA: Diagnosis not present

## 2016-10-11 DIAGNOSIS — E119 Type 2 diabetes mellitus without complications: Secondary | ICD-10-CM | POA: Insufficient documentation

## 2016-10-11 DIAGNOSIS — E785 Hyperlipidemia, unspecified: Secondary | ICD-10-CM | POA: Diagnosis not present

## 2016-10-11 DIAGNOSIS — I10 Essential (primary) hypertension: Secondary | ICD-10-CM | POA: Diagnosis not present

## 2016-10-11 DIAGNOSIS — Z951 Presence of aortocoronary bypass graft: Secondary | ICD-10-CM | POA: Insufficient documentation

## 2016-10-11 DIAGNOSIS — M7989 Other specified soft tissue disorders: Secondary | ICD-10-CM

## 2016-10-18 ENCOUNTER — Telehealth: Payer: Self-pay | Admitting: Internal Medicine

## 2016-10-18 MED ORDER — HYDRALAZINE HCL 25 MG PO TABS: 37.5000 mg | ORAL_TABLET | Freq: Three times a day (TID) | ORAL | 5 refills | Status: DC

## 2016-10-18 MED ORDER — ISOSORBIDE DINITRATE 20 MG PO TABS: 40.0000 mg | ORAL_TABLET | Freq: Three times a day (TID) | ORAL | 5 refills | Status: DC

## 2016-10-18 NOTE — Telephone Encounter (Signed)
Phone listed goes to VM - left msg to call.   I wished to verify dosage/frequency of medication - will seek advice on Bidil alternative from our clinical pharmacist & attempt to f/u w patient.

## 2016-10-18 NOTE — Telephone Encounter (Signed)
New message    Thayer Headings is calling for the pt.  Pt c/o medication issue:  1. Name of Medication: Bidil  2. How are you currently taking this medication (dosage and times per day)? 20-37.5 mg  3. Are you having a reaction (difficulty breathing--STAT)? no  4. What is your medication issue? Thayer Headings is calling for pt. She said pt is having trouble getting this medication from his pharmacy because it's on back order. She said he was told to have them change it but they don't handle cardiac meds. Does pt need to change? Please call.

## 2016-10-18 NOTE — Telephone Encounter (Signed)
Recommendation to change Bidil is to order inidividualy  Isosorbide dinitrate 20mg   Hydralazine 25mg  (1.5 tablets)  Patient taking maximum Bidil daily dose of 2 tablets three times daily; equivalent to isosorbide dinitrate 20 mg -2 tablets TID plus Hydralazine 25mg  - 1.5 tabltes TID  Recommend to contact another pharmacy 1st (if possible) and try to locale BiDil and avoid increase pill burden

## 2016-10-18 NOTE — Telephone Encounter (Signed)
Spoke to patient, who voiced understanding regarding the increased pill count, states he would have to do something anyway because he's been to 3 different pharmacies trying to get the Bidil. Informed him of instructions, he voiced his understanding. New Rx sent to his preferred pharmacy w instruction to d/c pt's Bidil

## 2016-10-19 ENCOUNTER — Telehealth: Payer: Self-pay | Admitting: Internal Medicine

## 2016-10-19 NOTE — Telephone Encounter (Signed)
Received call from Thayer Headings in regards to conversation yesterday about Bidil.  Advised that there was alternative recommended and nurse spoke to patient.  Patient is aware and verbalized understanding to nurse yesterday.   She verbalized understanding and will follow up with patient.

## 2016-11-09 ENCOUNTER — Telehealth: Payer: Self-pay | Admitting: Internal Medicine

## 2016-11-09 NOTE — Telephone Encounter (Signed)
Hydralazine 37.5 mg three times daily Isosorbide dininitrate 20 mg 2 tablets (40 mg) three times daily Clonidine hcl 0.1 mg three times daily  Patient has been taking an extra tablet of each of these medications in the middle of the night around 1 or 2 am and this seems to help his blood pressure. If patient does not take an extra dose, when he wakes up in the morning blood pressure is in the 200's. This morning without taking an extra dose of the medications blood pressure 204/72, heart rate 65. Patient wants to know if these prescriptions can be ordered for 4 times daily so that he can manage his blood pressure as well as not run out of medications early. Please advise.

## 2016-11-09 NOTE — Telephone Encounter (Signed)
New Message   pt verbalized that he is calling for rn   He wants rn to go over all of his medications   What he should take and how much

## 2016-11-10 NOTE — Telephone Encounter (Signed)
Patient called w/MD recommendations. He agreed to a BP check appt - scheduled w/pharmacy staff on 10/1 @ 0930. Patient instructed to bring him home BP cuff, any BP readings, and his medications

## 2016-11-10 NOTE — Telephone Encounter (Signed)
May need to increase dose of meds, not frequency. Would be good to get appt with me or hypertension clinic.  Dr. Lemmie Evens

## 2016-11-10 NOTE — Telephone Encounter (Signed)
This is a Dr Debara Pickett patient, will forward to him for review

## 2016-11-10 NOTE — Telephone Encounter (Signed)
Patient has not seen Dr. Debara Pickett in the office. Last saw Dr. Oval Linsey in office in August and his BP meds were adjusted at this time.

## 2016-11-14 ENCOUNTER — Ambulatory Visit (INDEPENDENT_AMBULATORY_CARE_PROVIDER_SITE_OTHER): Payer: Medicare Other | Admitting: Pharmacist

## 2016-11-14 VITALS — BP 188/70 | HR 60 | Wt 210.4 lb

## 2016-11-14 DIAGNOSIS — I10 Essential (primary) hypertension: Secondary | ICD-10-CM | POA: Diagnosis not present

## 2016-11-14 NOTE — Progress Notes (Signed)
Patient ID: [name]                 DOB: [birth year]                      MRN: [id number]     HPI: [name] is a 71 y.o. male referred by Dr. Debara Pickett to HTN clinic. PMH includes CAD status post CABG, diabetes, uncontrolled hypertension, chronic systolic and diastolic heart failure, hyperlipidemia, and CKD 4.  Patient has a long history of elevated BP with strong family history of hypertension. Patient continues to experience extremely elevated BP with multiple readings > 409 systolic. He denies any symptoms associated with high BP readings but reports some dizziness shortly after medication administration.   Presents today for initial BP clinic assessment and medication management. Patient self-adjusted his isosorbide, clonidine and hydralazine in response to elevated BP readings. Denies shortness of breath, chest pain or edema.  Also noted he was taking carvedilol 25mg  twice daily instead of 37.5mg  twice daily as prescribed on 08/2016.   Current HTN meds:  Amlodipine 10mg  daily Carvedilol 25mg  twice daily (never increased to 37.5mg  twice daily as prescribed for unknown reason) Clonidine 0.1mg  three times a day Doxazosin 4mg  twice daily Hydralazine 37.5mg  three times daily (taking extra dose for elevated BP readings) Isosorbide dinitrate 40mg  three times a day (taking extra dose for elevated BP readings)  BP goal: 130/80  Family History: hypertension from mother and father  Social History: denies tobacco use, alcohol use or caffeine  Diet: low sodium diet for long time  Exercise: sedentary due to problems with both hips  Home BP readings: 16 readings; average 176/64 (noted 2 readings with SBP >200); HR 56-76bpm  BP cuff calibrated by PCP this year  Wt Readings from Last 3 Encounters:  11/14/16 210 lb 6.4 oz (95.4 kg)  09/29/16 206 lb (93.4 kg)  09/19/16 202 lb 9.6 oz (91.9 kg)   BP Readings from Last 3 Encounters:  11/14/16 (!) 188/70  09/29/16 (!) 166/78  09/19/16 (!)  181/73   Pulse Readings from Last 3 Encounters:  11/14/16 60  09/29/16 65  09/19/16 78     Past Medical History:  Diagnosis Date  . CAD (coronary artery disease), native coronary artery    s/p NSTEMI and CABG x 3 (LIMA to LAD, SVG to diagonal, SVG to dRCA) on 08/05/16  . Carotid artery occlusion   . Chronic kidney disease   . Diabetes mellitus without complication ([place])   . Hypertension     Current Outpatient Prescriptions on File Prior to Visit  Medication Sig Dispense Refill  . allopurinol (ZYLOPRIM) 100 MG tablet Take 100 mg by mouth daily.  3  . AMITIZA 24 MCG capsule Take 24 mcg by mouth 2 (two) times daily.  5  . amLODipine (NORVASC) 10 MG tablet Take 10 mg by mouth daily.  2  . aspirin 325 MG tablet Take 1 tablet (325 mg total) by mouth daily.    . betamethasone valerate (VALISONE) 0.1 % cream Apply 1 application topically daily.   99  . cloNIDine (CATAPRES) 0.1 MG tablet Take 1 tablet (0.1 mg total) by mouth 3 (three) times daily. 90 tablet 5  . [facility] VITAMIN D 2000 units CAPS Take 1 capsule by mouth daily.  5  . doxazosin (CARDURA) 4 MG tablet Take 4 mg by mouth 2 (two) times daily.  2  . ferrous sulfate 325 (65 FE) MG tablet Take 1 tablet (325  mg total) by mouth daily with breakfast.  3  . fluticasone (FLONASE) 50 MCG/ACT nasal spray USE 2 SPRAYS IN EACH NOSTRIL AT BEDTIME  9  . furosemide (LASIX) 40 MG tablet Take 1 tablet (40 mg total) by mouth daily.    Marland Kitchen HUMULIN 70/30 (70-30) 100 UNIT/ML injection Inject 5-10 Units into the skin 2 (two) times daily with a meal.   9  . isosorbide dinitrate (ISORDIL) 20 MG tablet Take 2 tablets (40 mg total) by mouth 3 (three) times daily. 180 tablet 5  . levothyroxine (SYNTHROID, LEVOTHROID) 100 MCG tablet Take 100 mcg by mouth daily.    . rosuvastatin (CRESTOR) 20 MG tablet Take 1 tablet (20 mg total) by mouth every evening.    . tamsulosin (FLOMAX) 0.4 MG CAPS capsule Take 0.8 mg by mouth every evening.  9  . acetaminophen (TYLENOL)  325 MG tablet Take 2 tablets (650 mg total) by mouth every 6 (six) hours as needed for mild pain. (Patient not taking: Reported on 11/14/2016)    . carvedilol (COREG) 25 MG tablet Take 1.5 tablets (37.5 mg total) by mouth 2 (two) times daily. (Patient not taking: Reported on 11/14/2016)  3  . sodium bicarbonate 650 MG tablet Take 650 mg by mouth 2 (two) times daily.  4   No current facility-administered medications on file prior to visit.     Allergies  Allergen Reactions  . Sulfa Antibiotics Hives    Blood pressure (!) 188/70, pulse 60, weight 210 lb 6.4 oz (95.4 kg).  Essential hypertension Blood pressure remains uncontrolled with few home BP readings reaching 200 mmHg. Patient denies symptoms with elevated BP but reports some dizziness ~1 hr after taking morning BP medication. CKD-4 and current chronic use of furosemide is a limiting factor for therapeutic options.  During initial medication reconciliation it was noted patient never increased his carvedilol dose as prescribed on July/2018. Pulse remains stable between 56-76 bpm. Will increase carvedilol dose to previously prescribed dose of  37.5mg  twice daily, and increase hydralazine from 37.5mg  to 50mg  TID. Okay to take an extra 50mg  of hydralazine if BP > 160/110. Instructed to avoid extra doses of clonidine or isosorbide without provider's instructions. Patient will monitor BP twice daily and keep records to bring to follow up visit in 2 weeks. Also encouraged to contact clinic with any BP problems of medication questions. [facility]pref[facility] contacted by phone and all prescriptions related to BP were clarified.    [place]uana Wank Rodriguez-Guzman PharmD, BCPS, [place] [phone] 11/15/2016 12:35 PM

## 2016-11-14 NOTE — Patient Instructions (Addendum)
Return for a follow up appointment in 2 weeks  Your blood pressure today is 188/70 pulse 60  Check your blood pressure at home daily (if able) and keep record of the readings.  Take your BP meds as follows: *INCREASE hydralazine dose to 50mg  three times daily , plus an extra 50mg  as needed for blodd pressure above 160/110 *INCREASE carvedilol to 37.5mg  (1.5 tablets of 25mg ) twice daily - START on 11/16/2016 *CONTINUE all other medication as prescribed  Bring  and your record of home blood pressures to your next appointment.  Exercise as you're able, try to walk approximately 30 minutes per day.  Keep salt intake to a minimum, especially watch canned and prepared boxed foods.  Eat more fresh fruits and vegetables and fewer canned items.  Avoid eating in fast food restaurants.    HOW TO TAKE YOUR BLOOD PRESSURE: . Rest 5 minutes before taking your blood pressure. .  Don't smoke or drink caffeinated beverages for at least 30 minutes before. . Take your blood pressure before (not after) you eat. . Sit comfortably with your back supported and both feet on the floor (don't cross your legs). . Elevate your arm to heart level on a table or a desk. . Use the proper sized cuff. It should fit smoothly and snugly around your bare upper arm. There should be enough room to slip a fingertip under the cuff. The bottom edge of the cuff should be 1 inch above the crease of the elbow. . Ideally, take 3 measurements at one sitting and record the average.

## 2016-11-15 MED ORDER — HYDRALAZINE HCL 50 MG PO TABS: 50.0000 mg | ORAL_TABLET | Freq: Three times a day (TID) | ORAL | 5 refills | Status: DC

## 2016-11-15 NOTE — Assessment & Plan Note (Signed)
Blood pressure remains uncontrolled with few home BP readings reaching 200 mmHg. Patient denies symptoms with elevated BP but reports some dizziness ~1 hr after taking morning BP medication. CKD-4 and current chronic use of furosemide is a limiting factor for therapeutic options.  During initial medication reconciliation it was noted patient never increased his carvedilol dose as prescribed on July/2018. Pulse remains stable between 56-76 bpm. Will increase carvedilol dose to previously prescribed dose of  37.5mg  twice daily, and increase hydralazine from 37.5mg  to 50mg  TID. Okay to take an extra 50mg  of hydralazine if BP > 160/110. Instructed to avoid extra doses of clonidine or isosorbide without provider's instructions. Patient will monitor BP twice daily and keep records to bring to follow up visit in 2 weeks. Also encouraged to contact clinic with any BP problems of medication questions. [facility] by [facility] and all prescriptions related to BP were clarified.

## 2016-11-30 NOTE — Progress Notes (Signed)
Patient ID: [name]                 DOB: [birth year]                      MRN: [id number]     HPI: [name] is a 71 y.o. male referred by Dr. Debara Pickett to HTN clinic. PMH includes CAD status post CABG, diabetes, uncontrolled hypertension, chronic systolic and diastolic heart failure, hyperlipidemia, and CKD 4.  Patient has a long history of elevated BP with strong family history of hypertension. Patient continues to experience extremely elevated BP with multiple readings > 409 systolic. He denies any symptoms associated with high BP readings but reports some dizziness shortly after medication administration.   In the past patient had been self-adjusting his BP meds in an effort to better manage BP.  He has been following our guidelines for the past several months.  At his last visit his hydralazine was increased to 50 mg tid with a fourth dose that could be taken if BP was still > 160/100.  Patient took this fourth dose daily since his last visit.    He does admit to occasional dizziness associated with positional changes and denies problems with compliance.  His last A1c was stable at 7.1.  He has stage 4 CKD with a SCR of 3.84 and GFR of 17.  Because of this, we cannot add ACEI/ARB or diuretics.  He states he has talked to his nephrologist about dialysis, but is not interested in pursuing that as an option.    Current HTN meds:  Amlodipine 10mg  daily Carvedilol 37.5 mg twice daily  Clonidine 0.1mg  three times a day Doxazosin 4mg  twice daily Hydralazine 50mg  three times daily (actually took four times daily) Isosorbide dinitrate 40mg  three times a day   BP goal: 130/80  Family History: hypertension from mother and father  Patient is one of 6 children, states all 6 have had hypertension, 1 brother also with renal failure and did dialysis for some time before finally passing away  Social History: denies tobacco use, alcohol use or caffeine  Diet: low sodium diet for long time; almost  all meals at home - cereal for breakfast most days; did have gravy biscuit at [employer] this am.  Exercise: sedentary due to problems with both hips  Home BP readings: patient took 62 readings since last visit on October 1.  Of those on ly 6 were < 811 systolic and 25 were < 914.  4 were > 782 systolic.  Diastolic readings all WNL  Wt Readings from Last 3 Encounters:  11/14/16 210 lb 6.4 oz (95.4 kg)  09/29/16 206 lb (93.4 kg)  09/19/16 202 lb 9.6 oz (91.9 kg)   BP Readings from Last 3 Encounters:  12/01/16 (!) 158/66  11/14/16 (!) 188/70  09/29/16 (!) 166/78   Pulse Readings from Last 3 Encounters:  12/01/16 60  11/14/16 60  09/29/16 65     Past Medical History:  Diagnosis Date  . CAD (coronary artery disease), native coronary artery    s/p NSTEMI and CABG x 3 (LIMA to LAD, SVG to diagonal, SVG to dRCA) on 08/05/16  . Carotid artery occlusion   . Chronic kidney disease   . Diabetes mellitus without complication ([place])   . Hypertension     Current Outpatient Prescriptions on File Prior to Visit  Medication Sig Dispense Refill  . acetaminophen (TYLENOL) 325 MG tablet Take 2 tablets (650 mg  total) by mouth every 6 (six) hours as needed for mild pain. (Patient not taking: Reported on 11/14/2016)    . allopurinol (ZYLOPRIM) 100 MG tablet Take 100 mg by mouth daily.  3  . AMITIZA 24 MCG capsule Take 24 mcg by mouth 2 (two) times daily.  5  . amLODipine (NORVASC) 10 MG tablet Take 10 mg by mouth daily.  2  . aspirin 325 MG tablet Take 1 tablet (325 mg total) by mouth daily.    . betamethasone valerate (VALISONE) 0.1 % cream Apply 1 application topically daily.   99  . carvedilol (COREG) 25 MG tablet Take 1.5 tablets (37.5 mg total) by mouth 2 (two) times daily. (Patient not taking: Reported on 11/14/2016)  3  . cloNIDine (CATAPRES) 0.1 MG tablet Take 1 tablet (0.1 mg total) by mouth 3 (three) times daily. 90 tablet 5  . [employer] VITAMIN D 2000 units CAPS Take 1 capsule by mouth daily.  5    . doxazosin (CARDURA) 4 MG tablet Take 4 mg by mouth 2 (two) times daily.  2  . ferrous sulfate 325 (65 FE) MG tablet Take 1 tablet (325 mg total) by mouth daily with breakfast.  3  . fluticasone (FLONASE) 50 MCG/ACT nasal spray USE 2 SPRAYS IN EACH NOSTRIL AT BEDTIME  9  . furosemide (LASIX) 40 MG tablet Take 1 tablet (40 mg total) by mouth daily.    Marland Kitchen HUMULIN 70/30 (70-30) 100 UNIT/ML injection Inject 5-10 Units into the skin 2 (two) times daily with a meal.   9  . hydrALAZINE (APRESOLINE) 50 MG tablet Take 1 tablet (50 mg total) by mouth 3 (three) times daily. May take an extra 50mg  dose if blood pressure above 160/110. 90 tablet 5  . isosorbide dinitrate (ISORDIL) 20 MG tablet Take 2 tablets (40 mg total) by mouth 3 (three) times daily. 180 tablet 5  . levothyroxine (SYNTHROID, LEVOTHROID) 100 MCG tablet Take 100 mcg by mouth daily.    . rosuvastatin (CRESTOR) 20 MG tablet Take 1 tablet (20 mg total) by mouth every evening.    . sodium bicarbonate 650 MG tablet Take 650 mg by mouth 2 (two) times daily.  4  . tamsulosin (FLOMAX) 0.4 MG CAPS capsule Take 0.8 mg by mouth every evening.  9   No current facility-administered medications on file prior to visit.     Allergies  Allergen Reactions  . Sulfa Antibiotics Hives    Blood pressure (!) 158/66, pulse 60.  Essential hypertension Patient with resistant hypertension and CKD stage 4.  Home BP readings are still consistently in the 956-387 systolic range.  Patient is not interested in dialysis and therefore his BP may continue to be resistant.  Will increase his hydralazine to 100 mg tid today and have divided his evening medications so that clonidine and carvedilol are at dinner, while doxazosin, hydralazine and isosorbide are closer to midnight (he usually checks his BP around that time).  He is now taking maximum doses of 4/6 medications - all except doxazosin and clonidine.  At this point I don't think that increasing either of those will  have a significant effect in lowering his BP.  We could consider spironolactone 12.5 mg, as his potassium level was 3.6.  I would also consider checking a renal doppler for RAS, as his last was 3 years ago and he does have significant CAD and PAD.   He will see Dr. Debara Pickett next month and we can see him afterward if needed.  Tommy Medal PharmD CPP [facility] [address] [place] [address] 12/01/2016 1:44 PM

## 2016-12-01 ENCOUNTER — Encounter: Payer: Self-pay | Admitting: Pharmacist Clinician (PhC)/ Clinical Pharmacy Specialist

## 2016-12-01 ENCOUNTER — Ambulatory Visit (INDEPENDENT_AMBULATORY_CARE_PROVIDER_SITE_OTHER): Payer: Medicare Other | Admitting: Pharmacist Clinician (PhC)/ Clinical Pharmacy Specialist

## 2016-12-01 DIAGNOSIS — I10 Essential (primary) hypertension: Secondary | ICD-10-CM | POA: Diagnosis not present

## 2016-12-01 MED ORDER — HYDRALAZINE HCL 100 MG PO TABS: 100.0000 mg | ORAL_TABLET | Freq: Three times a day (TID) | ORAL | 5 refills | Status: DC

## 2016-12-01 NOTE — Assessment & Plan Note (Addendum)
Patient with resistant hypertension and CKD stage 4.  Home BP readings are still consistently in the 657-846 systolic range.  Patient is not interested in dialysis and therefore his BP may continue to be resistant.  Will increase his hydralazine to 100 mg tid today and have divided his evening medications so that clonidine and carvedilol are at dinner, while doxazosin, hydralazine and isosorbide are closer to midnight (he usually checks his BP around that time).  He is now taking maximum doses of 4/6 medications - all except doxazosin and clonidine.  At this point I don't think that increasing either of those will have a significant effect in lowering his BP.  We could consider spironolactone 12.5 mg, as his potassium level was 3.6.  I would also consider checking a renal doppler for RAS, as his last was 3 years ago and he does have significant CAD and PAD.   He will see Dr. Debara Pickett next month and we can see him afterward if needed.

## 2016-12-01 NOTE — Patient Instructions (Addendum)
Return for a a follow up appointment with Dr. Debara Pickett on November 1  Your blood pressure today is 158/66           Check your blood pressure at home daily and keep record of the readings.  Take your BP meds as follows:  AM (6 am) - amlodipine 10 mg, clonidine 0.1 mg, hydralazine 100 mg, carvedilol 37.5 mg, doxazosin 4 mg, isosorbide 40 mg  Noon - hydralazine 100 mg, isosorbide 40 mg, clonidine 0.1 mg   PM (6pm) - carvedilol 37.5 mg, clonidine 0.1 mg  Midnight - hydralazine 100 mg, doxazosin 4 mg, isosorbide 40 mg  Bring all of your meds, your BP cuff and your record of home blood pressures to your next appointment.  Exercise as you're able, try to walk approximately 30 minutes per day.  Keep salt intake to a minimum, especially watch canned and prepared boxed foods.  Eat more fresh fruits and vegetables and fewer canned items.  Avoid eating in fast food restaurants.    HOW TO TAKE YOUR BLOOD PRESSURE: . Rest 5 minutes before taking your blood pressure. .  Don't smoke or drink caffeinated beverages for at least 30 minutes before. . Take your blood pressure before (not after) you eat. . Sit comfortably with your back supported and both feet on the floor (don't cross your legs). . Elevate your arm to heart level on a table or a desk. . Use the proper sized cuff. It should fit smoothly and snugly around your bare upper arm. There should be enough room to slip a fingertip under the cuff. The bottom edge of the cuff should be 1 inch above the crease of the elbow. . Ideally, take 3 measurements at one sitting and record the average.

## 2016-12-02 ENCOUNTER — Emergency Department (HOSPITAL_COMMUNITY): Payer: Medicare Other

## 2016-12-02 ENCOUNTER — Emergency Department (HOSPITAL_COMMUNITY)
Admission: EM | Admit: 2016-12-02 | Discharge: 2016-12-02 | Disposition: A | Discharge status: Home / Self Care | Payer: Medicare Other | Attending: Emergency Medicine | Admitting: Emergency Medicine

## 2016-12-02 ENCOUNTER — Encounter (HOSPITAL_COMMUNITY): Payer: Self-pay | Admitting: Emergency Medicine

## 2016-12-02 DIAGNOSIS — R6883 Chills (without fever): Secondary | ICD-10-CM | POA: Insufficient documentation

## 2016-12-02 DIAGNOSIS — Z79899 Other long term (current) drug therapy: Secondary | ICD-10-CM | POA: Insufficient documentation

## 2016-12-02 DIAGNOSIS — I252 Old myocardial infarction: Secondary | ICD-10-CM | POA: Insufficient documentation

## 2016-12-02 DIAGNOSIS — N184 Chronic kidney disease, stage 4 (severe): Secondary | ICD-10-CM

## 2016-12-02 DIAGNOSIS — E039 Hypothyroidism, unspecified: Secondary | ICD-10-CM | POA: Diagnosis not present

## 2016-12-02 DIAGNOSIS — I509 Heart failure, unspecified: Secondary | ICD-10-CM | POA: Diagnosis not present

## 2016-12-02 DIAGNOSIS — Z794 Long term (current) use of insulin: Secondary | ICD-10-CM | POA: Diagnosis not present

## 2016-12-02 DIAGNOSIS — Z7982 Long term (current) use of aspirin: Secondary | ICD-10-CM | POA: Insufficient documentation

## 2016-12-02 DIAGNOSIS — I130 Hypertensive heart and chronic kidney disease with heart failure and stage 1 through stage 4 chronic kidney disease, or unspecified chronic kidney disease: Secondary | ICD-10-CM | POA: Insufficient documentation

## 2016-12-02 DIAGNOSIS — Z951 Presence of aortocoronary bypass graft: Secondary | ICD-10-CM | POA: Insufficient documentation

## 2016-12-02 DIAGNOSIS — E1165 Type 2 diabetes mellitus with hyperglycemia: Secondary | ICD-10-CM | POA: Insufficient documentation

## 2016-12-02 DIAGNOSIS — E1122 Type 2 diabetes mellitus with diabetic chronic kidney disease: Secondary | ICD-10-CM | POA: Insufficient documentation

## 2016-12-02 DIAGNOSIS — Z87891 Personal history of nicotine dependence: Secondary | ICD-10-CM | POA: Insufficient documentation

## 2016-12-02 DIAGNOSIS — E162 Hypoglycemia, unspecified: Secondary | ICD-10-CM

## 2016-12-02 LAB — CBC WITH DIFFERENTIAL/PLATELET
BASOS ABS: 0 10*3/uL (ref 0.0–0.1)
BASOS PCT: 0 %
EOS ABS: 0.1 10*3/uL (ref 0.0–0.7)
EOS PCT: 1 %
HCT: 31.2 % — ABNORMAL LOW (ref 39.0–52.0)
HEMOGLOBIN: 10.1 g/dL — AB (ref 13.0–17.0)
Lymphocytes Relative: 11 %
Lymphs Abs: 0.8 10*3/uL (ref 0.7–4.0)
MCH: 29.8 pg (ref 26.0–34.0)
MCHC: 32.4 g/dL (ref 30.0–36.0)
MCV: 92 fL (ref 78.0–100.0)
Monocytes Absolute: 0.3 10*3/uL (ref 0.1–1.0)
Monocytes Relative: 3 %
NEUTROS PCT: 85 %
Neutro Abs: 6.3 10*3/uL (ref 1.7–7.7)
PLATELETS: 148 10*3/uL — AB (ref 150–400)
RBC: 3.39 MIL/uL — AB (ref 4.22–5.81)
RDW: 16 % — ABNORMAL HIGH (ref 11.5–15.5)
WBC: 7.5 10*3/uL (ref 4.0–10.5)

## 2016-12-02 LAB — CBG MONITORING, ED
GLUCOSE-CAPILLARY: 90 mg/dL (ref 65–99)
GLUCOSE-CAPILLARY: 97 mg/dL (ref 65–99)
GLUCOSE-CAPILLARY: 134 mg/dL — AB (ref 65–99)
Glucose-Capillary: 123 mg/dL — ABNORMAL HIGH (ref 65–99)
Glucose-Capillary: 185 mg/dL — ABNORMAL HIGH (ref 65–99)

## 2016-12-02 LAB — BASIC METABOLIC PANEL
ANION GAP: 14 (ref 5–15)
BUN: 80 mg/dL — ABNORMAL HIGH (ref 6–20)
CO2: 24 mmol/L (ref 22–32)
Calcium: 9.7 mg/dL (ref 8.9–10.3)
Chloride: 104 mmol/L (ref 101–111)
Creatinine, Ser: 3.96 mg/dL — ABNORMAL HIGH (ref 0.61–1.24)
GFR calc non Af Amer: 14 mL/min — ABNORMAL LOW (ref >60)
GFR, EST AFRICAN AMERICAN: 16 mL/min — AB (ref >60)
GLUCOSE: 93 mg/dL (ref 65–99)
POTASSIUM: 3.8 mmol/L (ref 3.5–5.1)
Sodium: 142 mmol/L (ref 135–145)

## 2016-12-02 LAB — URINALYSIS, ROUTINE W REFLEX MICROSCOPIC
Bilirubin Urine: NEGATIVE
Glucose, UA: NEGATIVE mg/dL
Hgb urine dipstick: NEGATIVE
Ketones, ur: NEGATIVE mg/dL
LEUKOCYTES UA: NEGATIVE
NITRITE: NEGATIVE
PROTEIN: NEGATIVE mg/dL
Specific Gravity, Urine: 1.009 (ref 1.005–1.030)
pH: 5 (ref 5.0–8.0)

## 2016-12-02 LAB — BRAIN NATRIURETIC PEPTIDE: B NATRIURETIC PEPTIDE 5: 452 pg/mL — AB (ref 0.0–100.0)

## 2016-12-02 NOTE — ED Triage Notes (Signed)
Per Ems; upon arrival to pts apartment pt was on the ground. Pt does not know if he fell or not. Pts BS 44. Pt given 25g dextrose IV.

## 2016-12-02 NOTE — ED Notes (Signed)
Pt ambulated well, steady gate with 2 staff assist, pt states he usually walks with a cane.

## 2016-12-02 NOTE — ED Notes (Signed)
Called and spoke with pt's wife, she states she is going to find a ride for him and will be here as soon as possible

## 2016-12-02 NOTE — ED Notes (Signed)
Pt given peanut butter crackers and water. 

## 2016-12-02 NOTE — Discharge Instructions (Signed)
DO NOT USE INSULIN THIS MORNING TONIGHT, IF YOUR SUGAR IS 250 OR LESS, DO NOT GIVE YOURSELF INSULIN YOU CAN GO BACK TO REGULAR DOSING ON Saturday

## 2016-12-02 NOTE — ED Notes (Signed)
Pt given cup of water and urinal

## 2016-12-02 NOTE — ED Provider Notes (Signed)
[facility] EMERGENCY DEPARTMENT Provider Note   CSN: [id number] Arrival date & time: 12/02/16  0144     History   Chief Complaint Chief Complaint  Patient presents with  . Hypoglycemia    HPI [name] is a 71 y.o. male.  The history is provided by the patient and the EMS personnel.  Hypoglycemia  Initial blood sugar:  44 Blood sugar after intervention:  90 Severity:  Severe Onset quality:  Sudden Timing:  Constant Progression:  Improving Chronicity:  New Diabetic status:  Controlled with insulin Relieved by:  IV glucose Associated symptoms: no vomiting    Patient with h/o CAD, s/p CABG, diabetes, CKD presents with hypoglycemic episode Per EMS, pt found in floor by wife and EMS was called Initial glucose 44 Pt is now improving after glucose given Reports he does not recall what happened Last thing he remembers is telling his wife good night  Currently, denies CP/SOB No vomiting He reports chills No traumatic injury from fall is reported  Past Medical History:  Diagnosis Date  . CAD (coronary artery disease), native coronary artery    s/p NSTEMI and CABG x 3 (LIMA to LAD, SVG to diagonal, SVG to dRCA) on 08/05/16  . Carotid artery occlusion   . Chronic kidney disease   . Diabetes mellitus without complication ([place])   . Hypertension     Patient Active Problem List   Diagnosis Date Noted  . CHF (congestive heart failure) ([place])   . Hx of CABG   . S/P CABG x 4   . Diabetes mellitus type 2 in nonobese ([place])   . Benign essential HTN   . Acute blood loss anemia   . Acute pulmonary edema ([facility])   . Hypoxemia   . Acute respiratory failure with hypoxia ([place])   . Acute on chronic combined systolic and diastolic CHF (congestive heart failure) ([place]) 07/25/2016  . Essential hypertension 07/25/2016  . 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 AKI (acute kidney injury) ([place]) 07/25/2016  . CKD (chronic kidney disease) stage 4, GFR 15-29  ml/min (H[facility]) 07/25/2016  . Elevated troponin 07/25/2016  . Hypothyroidism 07/25/2016  . Dyslipidemia 07/25/2016  . Diabetes mellitus, insulin-dependent (IDDM or type I) ([place]) 07/25/2016  . NSTEMI (non-ST elevated myocardial infarction) ([place]) 07/25/2016    Past Surgical History:  Procedure Laterality Date  . CORONARY ARTERY BYPASS GRAFT N/A 08/05/2016   Procedure: CORONARY ARTERY BYPASS GRAFTING (CABG) x 3, LIMA to LAD, SVG to DIAGONAL, SVG to OM1, SVG to DISTAL RCA, USING LEFT MAMMARY ARTERY AND RIGHT GREATER SAPHENOUS VEIN HARVESTED ENDOSCOPICALLY;  Surgeon: [name], MD;  Location: [facility];  Service: Open Heart Surgery;  Laterality: N/A;  . LEFT HEART CATH AND CORONARY ANGIOGRAPHY N/A 07/28/2016   Procedure: Left Heart Cath and Coronary Angiography;  Surgeon: Jettie Booze, MD;  Location: [facility];  Service: Cardiovascular;  Laterality: N/A;  . LUMBAR FUSION  2005  . TEE WITHOUT CARDIOVERSION N/A 08/05/2016   Procedure: TRANSESOPHAGEAL ECHOCARDIOGRAM (TEE);  Surgeon: [name], MD;  Location: [facility];  Service: Open Heart Surgery;  Laterality: N/A;       Home Medications    Prior to Admission medications   Medication Sig Start Date End Date Taking? Authorizing Provider  acetaminophen (TYLENOL) 325 MG tablet Take 2 tablets (650 mg total) by mouth every 6 (six) hours as needed for mild pain. Patient not taking: Reported on 11/14/2016 08/16/16   Nani Skillern, PA-C  allopurinol (ZYLOPRIM) 100 MG tablet Take 100 mg by mouth daily. 05/23/16   [provider]  AMITIZA 24 MCG capsule Take 24 mcg by mouth 2 (two) times daily. 07/14/16   [provider]  amLODipine (NORVASC) 10 MG tablet Take 10 mg by mouth daily. 06/24/16   [provider]  aspirin 325 MG tablet Take 1 tablet (325 mg total) by mouth daily. 08/16/16   Nani Skillern, PA-C  betamethasone valerate (VALISONE) 0.1 % cream Apply 1 application topically daily.  07/15/16    [provider]  carvedilol (COREG) 25 MG tablet Take 1.5 tablets (37.5 mg total) by mouth 2 (two) times daily. Patient not taking: Reported on 11/14/2016 08/17/16   Nani Skillern, PA-C  cloNIDine (CATAPRES) 0.1 MG tablet Take 1 tablet (0.1 mg total) by mouth 3 (three) times daily. 09/29/16   Skeet Latch, MD  CVS VITAMIN D 2000 units CAPS Take 1 capsule by mouth daily. 05/17/16   [provider]  doxazosin (CARDURA) 4 MG tablet Take 4 mg by mouth 2 (two) times daily. 06/17/16   [provider]  ferrous sulfate 325 (65 FE) MG tablet Take 1 tablet (325 mg total) by mouth daily with breakfast. 08/17/16   Lars Pinks M, PA-C  fluticasone (FLONASE) 50 MCG/ACT nasal spray USE 2 SPRAYS IN EACH NOSTRIL AT BEDTIME 07/15/16   [provider]  furosemide (LASIX) 40 MG tablet Take 1 tablet (40 mg total) by mouth daily. 08/17/16   Nani Skillern, PA-C  HUMULIN 70/30 (70-30) 100 UNIT/ML injection Inject 5-10 Units into the skin 2 (two) times daily with a meal.  06/16/16   [provider]  hydrALAZINE (APRESOLINE) 100 MG tablet Take 1 tablet (100 mg total) by mouth 3 (three) times daily. 12/01/16   Hilty, Nadean Corwin, MD  isosorbide dinitrate (ISORDIL) 20 MG tablet Take 2 tablets (40 mg total) by mouth 3 (three) times daily. 10/18/16   Hilty, Nadean Corwin, MD  levothyroxine (SYNTHROID, LEVOTHROID) 100 MCG tablet Take 100 mcg by mouth daily. 07/25/16   [provider]  rosuvastatin (CRESTOR) 20 MG tablet Take 1 tablet (20 mg total) by mouth every evening. 08/16/16   Nani Skillern, PA-C  sodium bicarbonate 650 MG tablet Take 650 mg by mouth 2 (two) times daily. 06/16/16   [provider]  tamsulosin (FLOMAX) 0.4 MG CAPS capsule Take 0.8 mg by mouth every evening. 04/24/16   [provider]    Family History Family History  Problem Relation Age of Onset  . Heart disease Mother   . Hypertension Mother   . Hypertension Father      Social History Social History  Substance Use Topics  . Smoking status: Former Research scientist (life sciences)  . Smokeless tobacco: Never Used  . Alcohol use No     Allergies   Sulfa antibiotics   Review of Systems Review of Systems  Constitutional: Positive for chills.  Cardiovascular: Negative for chest pain.  Gastrointestinal: Negative for vomiting.  Musculoskeletal: Negative for neck pain.  Neurological: Negative for headaches.  All other systems reviewed and are negative.    Physical Exam Updated Vital Signs Temp (!) 94.1 F (34.5 C) (Rectal)   Ht 1.854 m (6\' 1" )   Wt 95.3 kg (210 lb)   BMI 27.71 kg/m   Physical Exam CONSTITUTIONAL: Well developed/well nourished HEAD: Normocephalic/atraumatic EYES: EOMI/PERRL ENMT: Mucous membranes moist, No evidence of facial/nasal trauma NECK: supple no meningeal signs SPINE/BACK:entire spine nontender CV: S1/S2 noted, no murmurs/rubs/gallops noted  LUNGS: basilar crackles noted, no distress noted ABDOMEN: soft, nontender, no rebound or guarding, bowel sounds noted throughout abdomen GU:no cva tenderness NEURO: Pt is awake/alert/appropriate, moves all extremitiesx4.  No facial droop.   EXTREMITIES: pulses normal/equal, full ROM, no tenderness or deformities SKIN: warm, color normal PSYCH: no abnormalities of mood noted, alert and oriented to situation   ED Treatments / Results  Labs (all labs ordered are listed, but only abnormal results are displayed) Labs Reviewed  BASIC METABOLIC PANEL - Abnormal; Notable for the following:       Result Value   BUN 80 (*)    Creatinine, Ser 3.96 (*)    GFR calc non Af Amer 14 (*)    GFR calc Af Amer 16 (*)    All other components within normal limits  CBC WITH DIFFERENTIAL/PLATELET - Abnormal; Notable for the following:    RBC 3.39 (*)    Hemoglobin 10.1 (*)    HCT 31.2 (*)    RDW 16.0 (*)    Platelets 148 (*)    All other components within normal limits  URINALYSIS, ROUTINE W REFLEX  MICROSCOPIC - Abnormal; Notable for the following:    Color, Urine STRAW (*)    All other components within normal limits  BRAIN NATRIURETIC PEPTIDE - Abnormal; Notable for the following:    B Natriuretic Peptide 452.0 (*)    All other components within normal limits  CBG MONITORING, ED - Abnormal; Notable for the following:    Glucose-Capillary 123 (*)    All other components within normal limits  CBG MONITORING, ED - Abnormal; Notable for the following:    Glucose-Capillary 134 (*)    All other components within normal limits  CBG MONITORING, ED - Abnormal; Notable for the following:    Glucose-Capillary 185 (*)    All other components within normal limits  CBG MONITORING, ED  CBG MONITORING, ED  CBG MONITORING, ED    EKG  EKG Interpretation  Date/Time:  Friday December 02 2016 01:52:36 EDT Ventricular Rate:  67 PR Interval:    QRS Duration: 106 QT Interval:  468 QTC Calculation: 495 R Axis:   92 Text Interpretation:  Sinus rhythm Anterior infarct, old Abnormal T, consider ischemia, lateral leads Interpretation limited secondary to artifact Premature ventricular complexes Confirmed by Ripley Fraise [id number]) on 12/02/2016 2:20:58 AM       Radiology Dg Chest Port 1 View  Result Date: 12/02/2016 CLINICAL DATA:  Patient was found on the ground of apartment floor. Weakness. History of cardiac disease, diabetes, hypertension, former smoker. EXAM: PORTABLE CHEST 1 VIEW COMPARISON:  09/19/2016 FINDINGS: Postoperative changes in the mediastinum. Cardiac enlargement with bilateral vascular congestion. Diffuse interstitial pattern to the lungs, new since previous study, suggesting interstitial edema. No focal consolidation. No blunting of costophrenic angles. No pneumothorax. Calcification of the aorta. IMPRESSION: Cardiac enlargement with developing vascular congestion and interstitial edema. No focal consolidation. Aortic atherosclerosis. Electronically Signed   By: Lucienne Capers  M.D.   On: 12/02/2016 02:33    Procedures Procedures (including critical care time)  Medications Ordered in ED Medications - No data to display   Initial Impression / Assessment and Plan / ED Course  I have reviewed the triage vital signs and the nursing notes.  Pertinent labs & imaging results that were available during my care of the patient were reviewed by me and considered in my medical decision making (see chart for details).     2:25 AM Pt with h/o CAD, CKD, diabetes  here with hypoglycemic episode and hypothermia Warming blanket applied Labs/imaging pending Pt awake/alert at this time 3:51 AM Pt feeling improved He is awake/alert He denies taking any oral meds for diabetes He only takes insulin Will continue to monitor 5:42 AM Pt improved BP (!) 179/62   Pulse 71   Temp 97.8 F (36.6 C) (Rectal)   Resp 14   Ht 1.854 m (6\' 1" )   Wt 95.3 kg (210 lb)   SpO2 100%   BMI 27.71 kg/m  Temp improved No hypoglycemia Taking PO Will ambulate 6:54 AM PT IMPROVED GLUCOSE IMPROVED TAKING PO HE AMBULATED WILL D/C HOME  HE REPORTS TAKING 15 UNITS OF INSULIN AROUND DINNERTIME WHICH IS MORE THAN HE USUALLY USES THIS IS LIKELY CULPRIT OF EPISODE WE DISCUSSED NO INSULIN THIS MORNING ,AND NO INSULIN TONIGHT IF GLUCOSE IS <250 HE CAN GO BACK TO NORMAL DOSING TOMORROW BUT HE SHOULD NOT USE 15 UNITS AGAIN  Final Clinical Impressions(s) / ED Diagnoses   Final diagnoses:  Hypoglycemia  Stage 4 chronic kidney disease ([place])    New Prescriptions New Prescriptions   No medications on file     Ripley Fraise, MD 12/02/16 [phone]

## 2016-12-07 ENCOUNTER — Other Ambulatory Visit: Payer: Self-pay

## 2016-12-07 MED ORDER — ISOSORBIDE DINITRATE 20 MG PO TABS: 40.0000 mg | ORAL_TABLET | Freq: Three times a day (TID) | ORAL | 5 refills | Status: DC

## 2016-12-09 ENCOUNTER — Other Ambulatory Visit: Payer: Self-pay

## 2016-12-09 MED ORDER — ISOSORBIDE DINITRATE 20 MG PO TABS: 40.0000 mg | ORAL_TABLET | Freq: Three times a day (TID) | ORAL | 5 refills | Status: DC

## 2016-12-15 ENCOUNTER — Ambulatory Visit (INDEPENDENT_AMBULATORY_CARE_PROVIDER_SITE_OTHER): Payer: Medicare Other | Admitting: Internal Medicine

## 2016-12-15 ENCOUNTER — Encounter: Payer: Self-pay | Admitting: Internal Medicine

## 2016-12-15 VITALS — BP 168/70 | HR 74 | Ht 73.0 in | Wt 221.6 lb

## 2016-12-15 DIAGNOSIS — Z951 Presence of aortocoronary bypass graft: Secondary | ICD-10-CM

## 2016-12-15 DIAGNOSIS — I10 Essential (primary) hypertension: Secondary | ICD-10-CM | POA: Diagnosis not present

## 2016-12-15 DIAGNOSIS — I5042 Chronic combined systolic (congestive) and diastolic (congestive) heart failure: Secondary | ICD-10-CM | POA: Diagnosis not present

## 2016-12-15 DIAGNOSIS — I110 Hypertensive heart disease with heart failure: Secondary | ICD-10-CM

## 2016-12-15 MED ORDER — CLONIDINE HCL 0.1 MG PO TABS
ORAL_TABLET | ORAL | 6 refills | Status: DC
Start: 2016-12-15 — End: 2017-07-13

## 2016-12-15 NOTE — Patient Instructions (Addendum)
Schedule Renal Dopplers   Your physician recommends that you schedule a follow-up appointment in: 1 month    Increase Clonidine 0.1 mg  take 2 tablets three times a day   6 am-Amlodipine 10 mg,Carvedilol 37.5 mg,Doxazosin 4 MG, Hydralazine 100 mg,Isosorbide 40 mg,Clonidine 0.1 mg ( 2 tablets )  NOON-take Hydralazine 100 mg, Isosorbide 40 mg,Clonidine 0.1 mg( 2 tablets )  PM ( 6pm)-Carvedilol 37.5 mg, Clonidine 0.1 mg ( 2 tablets )  Midnight-Hydralazine 100 mg,Doxazosin,Isosorbide 40 mg

## 2016-12-15 NOTE — Progress Notes (Signed)
OFFICE NOTE  Chief Complaint:  Routine follow-up  Primary Care Physician: Foye Spurling, MD  HPI:  [name] is a 71 y.o. male with a past medial history significant for hypertension, IDDM, BPH, prior herniated disk in 2005 sp L3/L4 diskectomy and fusion, mild bilateral ICA stenosis in 2014 and EF 60-65% with moderate LVH by echo in 2014, there was focal distal anterior and anteroseptal thinning and akinesis, diastolic dysfunction and mild to moderate pulmonary hypertension at the time. He also has CKD with creatinine of 2.65 in 2015 (now 3.83) -sees Dr. Hinda Lenis in [place]. He now presented today to [facility] with complaints of chest pain since last Saturday (started in the abdomen and worked up to the chest) and progressive dyspnea, ultimately brought in by EMS. He has required BIPAP and was weaned off prior to transport, but then restarted when he arrived here for high O2 requirement. Labs significant for Creatinine of 3.83 (GFR 19), Troponin T of 0.26, BNP of 19,808, anemia with H/H 9.1/27.5, glucose of 261, no leukocytosis. CXR shows mild cardiomegaly, CHF/consolidation - suspect pneumonia with superimposed pulmonary edema/ARDS, EKG shows NSR at 76 (personally reviewed) with anterior and lateral ST depression.  He ultimately underwent heart catheterization found to have multivessel coronary disease and then subsequently had three-vessel bypass with LIMA to LAD, SVG to diagonal and SVG to RCA on 08/05/16.  Since then, he was seen in follow-up by Dr. Oval Linsey and noted to be euvolemic on Lasix.  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  Clonidine 0.1 mg 3 times daily was added.  Subsequently he has had a number of hypertension clinic follow-up appointments and blood pressure remains elevated.  Overall he feels well, though.  12/15/2016  Mr. [name] has no new complaints today.  Blood pressure remains elevated.  He denies any worsening shortness  of breath, weight gain or lower extremity edema.  Fortunately he is not on dialysis.  PMHx:  Past Medical History:  Diagnosis Date  . CAD (coronary artery disease), native coronary artery    s/p NSTEMI and CABG x 3 (LIMA to LAD, SVG to diagonal, SVG to dRCA) on 08/05/16  . Carotid artery occlusion   . Chronic kidney disease   . Diabetes mellitus without complication ([place])   . Hypertension     Past Surgical History:  Procedure Laterality Date  . CORONARY ARTERY BYPASS GRAFT N/A 08/05/2016   Procedure: CORONARY ARTERY BYPASS GRAFTING (CABG) x 3, LIMA to LAD, SVG to DIAGONAL, SVG to OM1, SVG to DISTAL RCA, USING LEFT MAMMARY ARTERY AND RIGHT GREATER SAPHENOUS VEIN HARVESTED ENDOSCOPICALLY;  Surgeon: [name], MD;  Location: [place];  Service: Open Heart Surgery;  Laterality: N/A;  . LEFT HEART CATH AND CORONARY ANGIOGRAPHY N/A 07/28/2016   Procedure: Left Heart Cath and Coronary Angiography;  Surgeon: Jettie Booze, MD;  Location: [place] [facility];  Service: Cardiovascular;  Laterality: N/A;  . LUMBAR FUSION  2005  . TEE WITHOUT CARDIOVERSION N/A 08/05/2016   Procedure: TRANSESOPHAGEAL ECHOCARDIOGRAM (TEE);  Surgeon: [name], MD;  Location: [place];  Service: Open Heart Surgery;  Laterality: N/A;    FAMHx:  Family History  Problem Relation Age of Onset  . Heart disease Mother   . Hypertension Mother   . Hypertension Father     SOCHx:   reports that he has quit smoking. He has never used smokeless tobacco. He reports that he does not drink alcohol  or use drugs.  ALLERGIES:  Allergies  Allergen Reactions  . Sulfa Antibiotics Hives    ROS: Pertinent items noted in HPI and remainder of comprehensive ROS otherwise negative.  HOME MEDS: Current Outpatient Prescriptions on File Prior to Visit  Medication Sig Dispense Refill  . acetaminophen (TYLENOL) 325 MG tablet Take 2 tablets (650 mg total) by mouth every 6 (six) hours as needed for mild pain.    Marland Kitchen  allopurinol (ZYLOPRIM) 100 MG tablet Take 100 mg by mouth daily.  3  . AMITIZA 24 MCG capsule Take 24 mcg by mouth 2 (two) times daily.  5  . amLODipine (NORVASC) 10 MG tablet Take 10 mg by mouth daily.  2  . aspirin 325 MG tablet Take 1 tablet (325 mg total) by mouth daily.    . betamethasone valerate (VALISONE) 0.1 % cream Apply 1 application topically daily.   99  . carvedilol (COREG) 25 MG tablet Take 1.5 tablets (37.5 mg total) by mouth 2 (two) times daily.  3  . CVS VITAMIN D 2000 units CAPS Take 1 capsule by mouth daily.  5  . doxazosin (CARDURA) 4 MG tablet Take 4 mg by mouth 2 (two) times daily.  2  . ferrous sulfate 325 (65 FE) MG tablet Take 1 tablet (325 mg total) by mouth daily with breakfast.  3  . fluticasone (FLONASE) 50 MCG/ACT nasal spray USE 2 SPRAYS IN EACH NOSTRIL AT BEDTIME  9  . furosemide (LASIX) 40 MG tablet Take 1 tablet (40 mg total) by mouth daily.    Marland Kitchen HUMULIN 70/30 (70-30) 100 UNIT/ML injection Inject 5-10 Units into the skin 2 (two) times daily with a meal.   9  . hydrALAZINE (APRESOLINE) 100 MG tablet Take 1 tablet (100 mg total) by mouth 3 (three) times daily. 90 tablet 5  . isosorbide dinitrate (ISORDIL) 20 MG tablet Take 2 tablets (40 mg total) by mouth 3 (three) times daily. 180 tablet 5  . levothyroxine (SYNTHROID, LEVOTHROID) 100 MCG tablet Take 100 mcg by mouth daily.    . rosuvastatin (CRESTOR) 20 MG tablet Take 1 tablet (20 mg total) by mouth every evening.    . sodium bicarbonate 650 MG tablet Take 650 mg by mouth 2 (two) times daily.  4  . tamsulosin (FLOMAX) 0.4 MG CAPS capsule Take 0.8 mg by mouth every evening.  9   No current facility-administered medications on file prior to visit.     LABS/IMAGING: No results found for this or any previous visit (from the past 48 hour(s)). No results found.  LIPID PANEL:    Component Value Date/Time   CHOL 99 07/26/2016 0022   TRIG 80 07/26/2016 0022   HDL 38 (L) 07/26/2016 0022   CHOLHDL 2.6 07/26/2016  0022   VLDL 16 07/26/2016 0022   LDLCALC 45 07/26/2016 0022     WEIGHTS: Wt Readings from Last 3 Encounters:  12/15/16 221 lb 9.6 oz (100.5 kg)  12/02/16 210 lb (95.3 kg)  11/14/16 210 lb 6.4 oz (95.4 kg)    VITALS: BP (!) 168/70   Pulse 74   Ht 6\' 1"  (1.854 m)   Wt 221 lb 9.6 oz (100.5 kg)   SpO2 99%   BMI 29.24 kg/m   EXAM: General appearance: alert and no distress Neck: no carotid bruit, no JVD and thyroid not enlarged, symmetric, no tenderness/mass/nodules Lungs: diminished breath sounds bilaterally Heart: regular rate and rhythm Abdomen: soft, non-tender; bowel sounds normal; no masses,  no organomegaly Extremities: extremities  normal, atraumatic, no cyanosis or edema Pulses: 2+ and symmetric Skin: Skin color, texture, turgor normal. No rashes or lesions Neurologic: Grossly normal Psych: Pleasant  EKG: Deferred  ASSESSMENT: 1. CAD status post three-vessel CABG (07/2016) 2. Acute on chronic systolic and diastolic congestive heart failure 3. Hypertensive heart disease 4. Dyslipidemia 5. PAD  PLAN: 1.   Mr. Billy continues to be poorly controlled with regards to his hypertension.  Blood pressure is improved on clonidine and therefore I will increase it further to 0.2 mg 3 times daily.  Will need to see him back fairly soon for blood pressure check.  He seems to be euvolemic on his current diuretic dose.  Weight is been stable.  He is on statin and will need follow-up lipid profile regarding his CAD and PAD.  Given his extensive CAD and PAD and resistant hypertension, we will also check renal Dopplers to rule out any renal artery stenosis.  Pixie Casino, MD, [place]  Attending Cardiologist  Direct Dial: [phone]  Fax: [phone]  Website:  [email or web address] 12/15/2016, 1:41 PM

## 2017-01-16 ENCOUNTER — Ambulatory Visit (HOSPITAL_COMMUNITY)
Admission: RE | Admit: 2017-01-16 | Discharge: 2017-01-16 | Disposition: A | Discharge status: Home / Self Care | Payer: Medicare Other | Source: Ambulatory Visit | Attending: Cardiovascular Disease | Admitting: Cardiovascular Disease

## 2017-01-16 DIAGNOSIS — I10 Essential (primary) hypertension: Secondary | ICD-10-CM | POA: Diagnosis present

## 2017-01-16 DIAGNOSIS — Z87891 Personal history of nicotine dependence: Secondary | ICD-10-CM | POA: Diagnosis not present

## 2017-01-16 DIAGNOSIS — E785 Hyperlipidemia, unspecified: Secondary | ICD-10-CM | POA: Insufficient documentation

## 2017-01-16 DIAGNOSIS — E119 Type 2 diabetes mellitus without complications: Secondary | ICD-10-CM | POA: Insufficient documentation

## 2017-01-16 DIAGNOSIS — I774 Celiac artery compression syndrome: Secondary | ICD-10-CM | POA: Diagnosis not present

## 2017-01-21 ENCOUNTER — Other Ambulatory Visit: Payer: Self-pay | Admitting: Internal Medicine

## 2017-01-23 ENCOUNTER — Ambulatory Visit: Payer: Medicare Other | Admitting: Internal Medicine

## 2017-01-30 ENCOUNTER — Encounter: Payer: Self-pay | Admitting: Internal Medicine

## 2017-01-30 ENCOUNTER — Ambulatory Visit: Payer: Medicare Other | Admitting: Internal Medicine

## 2017-01-30 VITALS — BP 162/59 | HR 69 | Ht 73.0 in | Wt 212.0 lb

## 2017-01-30 DIAGNOSIS — I110 Hypertensive heart disease with heart failure: Secondary | ICD-10-CM

## 2017-01-30 DIAGNOSIS — I5042 Chronic combined systolic (congestive) and diastolic (congestive) heart failure: Secondary | ICD-10-CM | POA: Diagnosis not present

## 2017-01-30 DIAGNOSIS — I5022 Chronic systolic (congestive) heart failure: Secondary | ICD-10-CM

## 2017-01-30 DIAGNOSIS — I10 Essential (primary) hypertension: Secondary | ICD-10-CM

## 2017-01-30 DIAGNOSIS — Z951 Presence of aortocoronary bypass graft: Secondary | ICD-10-CM

## 2017-01-30 NOTE — Progress Notes (Signed)
OFFICE NOTE  Chief Complaint:  Follow-up hypertension  Primary Care Physician: Foye Spurling, MD  HPI:  [name] is a 71 y.o. male with a past medial history significant for hypertension, IDDM, BPH, prior herniated disk in 2005 sp L3/L4 diskectomy and fusion, mild bilateral ICA stenosis in 2014 and EF 60-65% with moderate LVH by echo in 2014, there was focal distal anterior and anteroseptal thinning and akinesis, diastolic dysfunction and mild to moderate pulmonary hypertension at the time. He also has CKD with creatinine of 2.65 in 2015 (now 3.83) -sees Dr. Hinda Lenis in [place]. He now presented today to [facility] with complaints of chest pain since last Saturday (started in the abdomen and worked up to the chest) and progressive dyspnea, ultimately brought in by EMS. He has required BIPAP and was weaned off prior to transport, but then restarted when he arrived here for high O2 requirement. Labs significant for Creatinine of 3.83 (GFR 19), Troponin T of 0.26, BNP of 19,808, anemia with H/H 9.1/27.5, glucose of 261, no leukocytosis. CXR shows mild cardiomegaly, CHF/consolidation - suspect pneumonia with superimposed pulmonary edema/ARDS, EKG shows NSR at 76 (personally reviewed) with anterior and lateral ST depression.  He ultimately underwent heart catheterization found to have multivessel coronary disease and then subsequently had three-vessel bypass with LIMA to LAD, SVG to diagonal and SVG to RCA on 08/05/16.  Since then, he was seen in follow-up by Dr. Oval Linsey and noted to be euvolemic on Lasix.  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  Clonidine 0.1 mg 3 times daily was added.  Subsequently he has had a number of hypertension clinic follow-up appointments and blood pressure remains elevated.  Overall he feels well, though.  12/15/2016  [name] has no new complaints today.  Blood pressure remains elevated.  He denies any worsening  shortness of breath, weight gain or lower extremity edema.  Fortunately he is not on dialysis.  01/30/2017  [name] returns today for follow-up.  He says his blood pressures appeared somewhat better with 3 times daily clonidine.  He occasionally misses his night dose and was not taking it before going to bed which is usually around 8:00.  He says he wakes up at about 11:45 to go to the bathroom and then sometimes remembers to take it then, but has trouble falling back asleep.  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  With regards to this he is asymptomatic and denies any abdominal pain.  PMHx:  Past Medical History:  Diagnosis Date  . CAD (coronary artery disease), native coronary artery    s/p NSTEMI and CABG x 3 (LIMA to LAD, SVG to diagonal, SVG to dRCA) on 08/05/16  . Carotid artery occlusion   . Chronic kidney disease   . Diabetes mellitus without complication ([place])   . Hypertension     Past Surgical History:  Procedure Laterality Date  . CORONARY ARTERY BYPASS GRAFT N/A 08/05/2016   Procedure: CORONARY ARTERY BYPASS GRAFTING (CABG) x 3, LIMA to LAD, SVG to DIAGONAL, SVG to OM1, SVG to DISTAL RCA, USING LEFT MAMMARY ARTERY AND RIGHT GREATER SAPHENOUS VEIN HARVESTED ENDOSCOPICALLY;  Surgeon: [name], MD;  Location: [facility];  Service: Open Heart Surgery;  Laterality: N/A;  . LEFT HEART CATH AND CORONARY ANGIOGRAPHY N/A 07/28/2016   Procedure: Left Heart Cath and Coronary Angiography;  Surgeon: Irish Lack,  Charlann Lange, MD;  Location: [place] [facility];  Service: Cardiovascular;  Laterality: N/A;  . LUMBAR FUSION  2005  . TEE WITHOUT CARDIOVERSION N/A 08/05/2016   Procedure: TRANSESOPHAGEAL ECHOCARDIOGRAM (TEE);  Surgeon: [name], MD;  Location: [facility];  Service: Open Heart Surgery;  Laterality: N/A;    FAMHx:  Family History  Problem Relation Age of Onset  . Heart disease  Mother   . Hypertension Mother   . Hypertension Father     SOCHx:   reports that he has quit smoking. he has never used smokeless tobacco. He reports that he does not drink alcohol or use drugs.  ALLERGIES:  Allergies  Allergen Reactions  . Sulfa Antibiotics Hives    ROS: Pertinent items noted in HPI and remainder of comprehensive ROS otherwise negative.  HOME MEDS: Current Outpatient Medications on File Prior to Visit  Medication Sig Dispense Refill  . acetaminophen (TYLENOL) 325 MG tablet Take 2 tablets (650 mg total) by mouth every 6 (six) hours as needed for mild pain.    Marland Kitchen allopurinol (ZYLOPRIM) 100 MG tablet Take 100 mg by mouth daily.  3  . AMITIZA 24 MCG capsule Take 24 mcg by mouth 2 (two) times daily.  5  . amLODipine (NORVASC) 10 MG tablet Take 10 mg by mouth daily.  2  . aspirin 325 MG tablet Take 1 tablet (325 mg total) by mouth daily.    . betamethasone valerate (VALISONE) 0.1 % cream Apply 1 application topically daily.   99  . carvedilol (COREG) 25 MG tablet TAKE 1 & 1/2 TABLET BY MOUTH TWICE A DAY 270 tablet 3  . cloNIDine (CATAPRES) 0.1 MG tablet Take 0.1 mg ( 2 tablets )  three times a day 180 tablet 6  . CVS VITAMIN D 2000 units CAPS Take 1 capsule by mouth daily.  5  . doxazosin (CARDURA) 4 MG tablet Take 4 mg by mouth 2 (two) times daily.  2  . ferrous sulfate 325 (65 FE) MG tablet Take 1 tablet (325 mg total) by mouth daily with breakfast.  3  . fluticasone (FLONASE) 50 MCG/ACT nasal spray USE 2 SPRAYS IN EACH NOSTRIL AT BEDTIME  9  . furosemide (LASIX) 40 MG tablet Take 1 tablet (40 mg total) by mouth daily.    Marland Kitchen HUMULIN 70/30 (70-30) 100 UNIT/ML injection Inject 5-10 Units into the skin 2 (two) times daily with a meal.   9  . hydrALAZINE (APRESOLINE) 100 MG tablet Take 1 tablet (100 mg total) by mouth 3 (three) times daily. 90 tablet 5  . isosorbide dinitrate (ISORDIL) 20 MG tablet Take 2 tablets (40 mg total) by mouth 3 (three) times daily. 180 tablet 5    . levothyroxine (SYNTHROID, LEVOTHROID) 100 MCG tablet Take 100 mcg by mouth daily.    . rosuvastatin (CRESTOR) 20 MG tablet Take 1 tablet (20 mg total) by mouth every evening.    . sodium bicarbonate 650 MG tablet Take 650 mg by mouth 2 (two) times daily.  4  . tamsulosin (FLOMAX) 0.4 MG CAPS capsule Take 0.8 mg by mouth every evening.  9   No current facility-administered medications on file prior to visit.     LABS/IMAGING: No results found for this or any previous visit (from the past 48 hour(s)). No results found.  LIPID PANEL:    Component Value Date/Time   CHOL 99 07/26/2016 0022   TRIG 80 07/26/2016 0022   HDL 38 (L) 07/26/2016 0022   CHOLHDL  2.6 07/26/2016 0022   VLDL 16 07/26/2016 0022   LDLCALC 45 07/26/2016 0022     WEIGHTS: Wt Readings from Last 3 Encounters:  01/30/17 212 lb (96.2 kg)  12/15/16 221 lb 9.6 oz (100.5 kg)  12/02/16 210 lb (95.3 kg)    VITALS: BP (!) 162/59   Pulse 69   Ht 6\' 1"  (1.854 m)   Wt 212 lb (96.2 kg)   BMI 27.97 kg/m   EXAM: Deferred  EKG: Deferred  ASSESSMENT: 1. CAD status post three-vessel CABG (07/2016) 2. Acute on chronic systolic and diastolic congestive heart failure 3. Hypertensive heart disease 4. Dyslipidemia 5. PAD  PLAN: 1.   [name] has had improvement in his heart failure and blood pressure appears better controlled.  He is not always compliant with 3 times daily clonidine.  I increase the dose to 0.2 mg 3 times daily.  Fortunately his Dopplers indicate no significant renal artery stenosis.  We will continue his current therapies and plan to see him back in 6 months or sooner as necessary.  Pixie Casino, MD, [place]  Attending Cardiologist  Direct Dial: [phone]  Fax: [phone]  Website:  [email or web address] 01/30/2017, 9:24 AM

## 2017-01-30 NOTE — Patient Instructions (Addendum)
Your physician wants you to follow-up in: 6 months with Dr. Hilty. You will receive a reminder letter in the mail two months in advance. If you don't receive a letter, please call our office to schedule the follow-up appointment.    

## 2017-02-05 ENCOUNTER — Other Ambulatory Visit: Payer: Self-pay | Admitting: Internal Medicine

## 2017-02-06 NOTE — Telephone Encounter (Signed)
REFILL 

## 2017-02-10 ENCOUNTER — Telehealth: Payer: Self-pay | Admitting: Internal Medicine

## 2017-02-10 MED ORDER — HYDRALAZINE HCL 100 MG PO TABS: 100.0000 mg | ORAL_TABLET | Freq: Three times a day (TID) | ORAL | 3 refills | Status: DC

## 2017-02-10 NOTE — Telephone Encounter (Signed)
Returned the call to the patient. He stated that his prescription for Hydralazine was changed to 37.5 mg tid from 100 mg tid. According to his last visit, he should still be on Hydralazine 100 mg tid. The prescription has been changed back and the patient has been made aware.

## 2017-02-10 NOTE — Telephone Encounter (Signed)
Pt c/o medication issue:  1. Name of Medication: hydralazine 25 mg   2. How are you currently taking this medication (dosage and times per day)?   3. Are you having a reaction (difficulty breathing--STAT)? n/a  4. What is your medication issue? Patient states that his previous prescription was for 100 mg and now he has a prescription for 25 mg. Patient would like to know if he his medication was changed?

## 2017-03-10 ENCOUNTER — Encounter: Payer: Self-pay | Admitting: Family Medicine

## 2017-03-10 ENCOUNTER — Ambulatory Visit (INDEPENDENT_AMBULATORY_CARE_PROVIDER_SITE_OTHER): Payer: Medicare Other | Admitting: Family Medicine

## 2017-03-10 VITALS — BP 132/47 | HR 68 | Temp 97.6°F | Ht 73.0 in | Wt 216.0 lb

## 2017-03-10 DIAGNOSIS — E11649 Type 2 diabetes mellitus with hypoglycemia without coma: Secondary | ICD-10-CM | POA: Diagnosis not present

## 2017-03-10 DIAGNOSIS — E039 Hypothyroidism, unspecified: Secondary | ICD-10-CM | POA: Diagnosis not present

## 2017-03-10 DIAGNOSIS — I110 Hypertensive heart disease with heart failure: Secondary | ICD-10-CM

## 2017-03-10 DIAGNOSIS — I2583 Coronary atherosclerosis due to lipid rich plaque: Secondary | ICD-10-CM

## 2017-03-10 DIAGNOSIS — N5203 Combined arterial insufficiency and corporo-venous occlusive erectile dysfunction: Secondary | ICD-10-CM | POA: Diagnosis not present

## 2017-03-10 DIAGNOSIS — E119 Type 2 diabetes mellitus without complications: Secondary | ICD-10-CM | POA: Diagnosis not present

## 2017-03-10 DIAGNOSIS — I5042 Chronic combined systolic (congestive) and diastolic (congestive) heart failure: Secondary | ICD-10-CM

## 2017-03-10 DIAGNOSIS — E785 Hyperlipidemia, unspecified: Secondary | ICD-10-CM

## 2017-03-10 DIAGNOSIS — I2510 Atherosclerotic heart disease of native coronary artery without angina pectoris: Secondary | ICD-10-CM

## 2017-03-10 LAB — BAYER DCA HB A1C WAIVED: HB A1C (BAYER DCA - WAIVED): 8.9 % — ABNORMAL HIGH (ref <7.0)

## 2017-03-10 MED ORDER — ALLOPURINOL 100 MG PO TABS: 100.0000 mg | ORAL_TABLET | Freq: Every day | ORAL | 1 refills | Status: DC

## 2017-03-10 MED ORDER — AMITIZA 24 MCG PO CAPS: 24.0000 ug | ORAL_CAPSULE | Freq: Two times a day (BID) | ORAL | 1 refills | Status: DC

## 2017-03-10 MED ORDER — DOXAZOSIN MESYLATE 4 MG PO TABS: 4.0000 mg | ORAL_TABLET | Freq: Two times a day (BID) | ORAL | 1 refills | Status: DC

## 2017-03-10 MED ORDER — SODIUM BICARBONATE 650 MG PO TABS: 650.0000 mg | ORAL_TABLET | Freq: Two times a day (BID) | ORAL | 1 refills | Status: DC

## 2017-03-10 MED ORDER — ROSUVASTATIN CALCIUM 20 MG PO TABS: 20.0000 mg | ORAL_TABLET | Freq: Every evening | ORAL | 1 refills | Status: DC

## 2017-03-10 MED ORDER — AMLODIPINE BESYLATE 10 MG PO TABS: 10.0000 mg | ORAL_TABLET | Freq: Every day | ORAL | 1 refills | Status: DC

## 2017-03-10 MED ORDER — INSULIN GLARGINE 100 UNIT/ML SOLOSTAR PEN: 10.0000 [IU] | PEN_INJECTOR | Freq: Every day | SUBCUTANEOUS | 99 refills | Status: DC

## 2017-03-10 MED ORDER — ALPROSTADIL (VASODILATOR) 40 MCG IC KIT: 40.0000 ug | PACK | INTRACAVERNOUS | 12 refills | Status: DC | PRN

## 2017-03-10 MED ORDER — LEVOTHYROXINE SODIUM 100 MCG PO TABS: 100.0000 ug | ORAL_TABLET | Freq: Every day | ORAL | 1 refills | Status: DC

## 2017-03-10 NOTE — Progress Notes (Signed)
Subjective:  Patient ID: [name], male    DOB: 03/10/1945  Age: 72 y.o. MRN: [id number]  CC: New Patient (Initial Visit) (pt here today to establish care since his last PCP retired.)   HPI [name] presents for new patient visit to establish for care for his various illnesses.  His primary care doctor Dr. Jeanann Lewandowsky has retired he is looking for someone new to help him with his various concerns.  Of note is that he had a heart attack approximately 6 months ago.  He had a bout of congestive heart failure as well.  He has been noted to have LVH by echo.  He was seen about 1 month ago by Dr. Debara Pickett of cardiology .  He was noted to have an echocardiogram in 2014 that showed a 60-65% ejection fraction with anteroseptal thinning and akinesis diastolic dysfunction and mild to moderate pulmonary hypertension.  He was also noted to have an elevated BN P of 19,808 a hemoglobin of 9.1 and a creatinine of 2.83 as well as but troponin T of 0.26.  Recent heart catheterization showed multivessel coronary disease and he had a 3 vessel bypass with LIMA to LAD with SVG to diagonal and SVG to right coronary in June 2018.  He takes multiple medications noted below including clonidine and hydralazine for blood pressure and heart failure prevention.  He also takes a 37-1/2 twice daily dose of carvedilol.  Others as noted below.  He is a thyroid patient.  Patient presents for follow-up on  thyroid. The patient has a history of hypothyroidism for many years. It has been stable recently. Pt. denies any change in  voice, loss of hair, heat or cold intolerance. Energy level has been adequate . Patient denies constipation and diarrhea. No myxedema. Medication is as noted below. Verified that pt is taking it daily on an empty stomach. Well tolerated.  Patient also takes insulin 70/30 twice daily.  He has been taking between 5 and 10 units.  When his sugar goes above 200 he takes 10 units and then his sugar drops too  low so he has cut back to 5 units.  He checks his blood sugar twice daily and it has been running in the 200 range recently.  He sees Dr. Lowanda Foster for renal insufficiency.  He is in stage IV.  Most recent BUN and creatinine readings were 80/3.96.  This was 3 months ago.  Currently he states that he is stable he has some swelling in the right leg that is mild-to-moderate for him.  He denies any shortness of breath.  He is able to go about his usual daily routine.  He does not do any strenuous activities.  He does have some back pain and has had surgery with Dr. Carloyn Manner.  He reports having arthritis and stiffening of some of the other joints such as hips knees etc. Periodically.   Depression screen PHQ 2/9 03/10/2017  Decreased Interest 0  Down, Depressed, Hopeless 0  PHQ - 2 Score 0    History [name] has a past medical history of CAD (coronary artery disease), native coronary artery, Carotid artery occlusion, Chronic kidney disease, Diabetes mellitus without complication ([place]), and Hypertension.   He has a past surgical history that includes Lumbar fusion (2005); LEFT HEART CATH AND CORONARY ANGIOGRAPHY (N/A, 07/28/2016); Coronary artery bypass graft (N/A, 08/05/2016); TEE without cardioversion (N/A, 08/05/2016); Joint replacement (Bilateral); Appendectomy; and Tonsilectomy, adenoidectomy, bilateral myringotomy and tubes.   His family history includes  Arthritis in his father, maternal grandfather, maternal grandmother, mother, paternal grandfather, and paternal grandmother; Asthma in his sister; Cancer in his brother, brother, father, and mother; Diabetes in his brother, father, and mother; Heart disease in his mother; Hyperlipidemia in his brother, brother, brother, father, mother, sister, and sister; Hypertension in his brother, brother, brother, father, mother, sister, and sister; Kidney disease in his mother; Vision loss in his sister.He reports that he has quit smoking. he has never used smokeless  tobacco. He reports that he does not drink alcohol or use drugs.  Allergies as of 03/10/2017      Reactions   Sulfa Antibiotics Hives      Medication List        Accurate as of 03/10/17  5:57 PM. Always use your most recent med list.          acetaminophen 325 MG tablet Commonly known as:  TYLENOL Take 2 tablets (650 mg total) by mouth every 6 (six) hours as needed for mild pain.   allopurinol 100 MG tablet Commonly known as:  ZYLOPRIM Take 1 tablet (100 mg total) by mouth daily.   alprostadil 40 MCG injection Commonly known as:  EDEX 40 mcg by Intracavitary route as needed for erectile dysfunction. use no more than 3 times per week   AMITIZA 24 MCG capsule Generic drug:  lubiprostone Take 1 capsule (24 mcg total) by mouth 2 (two) times daily.   amLODipine 10 MG tablet Commonly known as:  NORVASC Take 1 tablet (10 mg total) by mouth daily.   aspirin 500 MG tablet Take 500 mg by mouth every 6 (six) hours as needed for pain.   betamethasone valerate 0.1 % cream Commonly known as:  VALISONE Apply 1 application topically daily.   carvedilol 25 MG tablet Commonly known as:  COREG TAKE 1 & 1/2 TABLET BY MOUTH TWICE A DAY   cloNIDine 0.1 MG tablet Commonly known as:  CATAPRES Take 0.1 mg ( 2 tablets )  three times a day   [employer] VITAMIN D 2000 units Caps Generic drug:  Cholecalciferol Take 1 capsule by mouth daily.   doxazosin 4 MG tablet Commonly known as:  CARDURA Take 1 tablet (4 mg total) by mouth 2 (two) times daily.   ferrous sulfate 325 (65 FE) MG tablet Take 1 tablet (325 mg total) by mouth daily with breakfast.   finasteride 5 MG tablet Commonly known as:  PROSCAR Take 5 mg by mouth daily.   fluticasone 50 MCG/ACT nasal spray Commonly known as:  FLONASE USE 2 SPRAYS IN EACH NOSTRIL AT BEDTIME   furosemide 40 MG tablet Commonly known as:  LASIX Take 1 tablet (40 mg total) by mouth daily.   gabapentin 100 MG capsule Commonly known as:   NEURONTIN Take 100 mg by mouth 3 (three) times daily.   hydrALAZINE 100 MG tablet Commonly known as:  APRESOLINE Take 1 tablet (100 mg total) by mouth 3 (three) times daily.   Insulin Glargine 100 UNIT/ML Solostar Pen Commonly known as:  LANTUS SOLOSTAR Inject 10 Units into the skin daily at 10 pm.   isosorbide dinitrate 20 MG tablet Commonly known as:  ISORDIL Take 2 tablets (40 mg total) by mouth 3 (three) times daily.   levothyroxine 100 MCG tablet Commonly known as:  SYNTHROID, LEVOTHROID Take 1 tablet (100 mcg total) by mouth daily.   rosuvastatin 20 MG tablet Commonly known as:  CRESTOR Take 1 tablet (20 mg total) by mouth every evening.   sodium bicarbonate  650 MG tablet Take 1 tablet (650 mg total) by mouth 2 (two) times daily.        ROS Review of Systems  Constitutional: Positive for fatigue. Negative for appetite change, chills, diaphoresis, fever and unexpected weight change. Activity change: Limited by his overall medical health.  HENT: Negative for congestion, ear pain, hearing loss, postnasal drip, rhinorrhea, sore throat, tinnitus and trouble swallowing.   Eyes: Negative for photophobia, pain, discharge and redness.  Respiratory: Negative for apnea, cough, choking, shortness of breath, wheezing and stridor.   Cardiovascular: Positive for palpitations and leg swelling. Negative for chest pain.  Gastrointestinal: Negative for abdominal distention, abdominal pain, blood in stool, constipation, diarrhea, nausea and vomiting.  Endocrine: Negative for cold intolerance, heat intolerance, polydipsia, polyphagia and polyuria.  Genitourinary: Positive for difficulty urinating (Has BPH and followed by urology) and frequency. Negative for dysuria, enuresis, flank pain, genital sores and hematuria.  Musculoskeletal: Positive for arthralgias and back pain. Negative for joint swelling.  Skin: Negative for color change, rash and wound.  Allergic/Immunologic: Negative for  immunocompromised state.  Neurological: Negative for dizziness, tremors, seizures, syncope, facial asymmetry, speech difficulty, weakness, light-headedness, numbness and headaches.  Hematological: Does not bruise/bleed easily.  Psychiatric/Behavioral: Negative for agitation, behavioral problems, confusion, decreased concentration, dysphoric mood, hallucinations, sleep disturbance and suicidal ideas. The patient is not nervous/anxious and is not hyperactive.     Objective:  BP (!) 132/47   Pulse 68   Temp 97.6 F (36.4 C) (Oral)   Ht 6' 1"  (1.854 m)   Wt 216 lb (98 kg)   BMI 28.50 kg/m   BP Readings from Last 3 Encounters:  03/10/17 (!) 132/47  01/30/17 (!) 162/59  12/15/16 (!) 168/70    Wt Readings from Last 3 Encounters:  03/10/17 216 lb (98 kg)  01/30/17 212 lb (96.2 kg)  12/15/16 221 lb 9.6 oz (100.5 kg)     Physical Exam  Constitutional: He is oriented to person, place, and time. He appears well-developed and well-nourished. No distress.  Appears chronically ill  HENT:  Head: Normocephalic and atraumatic.  Mouth/Throat: Oropharynx is clear and moist.  Eyes: EOM are normal. Pupils are equal, round, and reactive to light.  Neck: Normal range of motion. No tracheal deviation present. No thyromegaly present.  Cardiovascular: Normal rate and regular rhythm. Exam reveals no gallop and no friction rub.  Murmur (Holosystolic  2+, harsh loudest at the left sternal border) heard. Pulmonary/Chest: Breath sounds normal. He has no wheezes. He has no rales.  Abdominal: Soft. He exhibits no mass. There is no tenderness.  Musculoskeletal: Normal range of motion. He exhibits edema (Trace at the right lower extremity).  Neurological: He is alert and oriented to person, place, and time.  Skin: Skin is warm and dry.  Psychiatric: He has a normal mood and affect. His behavior is normal. Thought content normal.   Results for orders placed or performed in visit on 03/10/17  Bayer DCA Hb  A1c Waived  Result Value Ref Range   Bayer DCA Hb A1c Waived 8.9 (H) <7.0 %      Assessment & Plan:   [name] was seen today for new patient (initial visit).  Diagnoses and all orders for this visit:  Hypertensive heart disease with chronic combined systolic and diastolic congestive heart failure (HCC) -     CBC with Differential/Platelet -     CMP14+EGFR  Hypothyroidism, unspecified type -     TSH  Dyslipidemia  Diabetes mellitus type 2 in nonobese (  [place]) -     Bayer DCA Hb A1c Waived  Coronary artery disease due to lipid rich plaque  Combined arterial insufficiency and corporo-venous occlusive erectile dysfunction  Diabetic hypoglycemia (HCC)  Other orders -     allopurinol (ZYLOPRIM) 100 MG tablet; Take 1 tablet (100 mg total) by mouth daily. -     AMITIZA 24 MCG capsule; Take 1 capsule (24 mcg total) by mouth 2 (two) times daily. -     amLODipine (NORVASC) 10 MG tablet; Take 1 tablet (10 mg total) by mouth daily. -     doxazosin (CARDURA) 4 MG tablet; Take 1 tablet (4 mg total) by mouth 2 (two) times daily. -     levothyroxine (SYNTHROID, LEVOTHROID) 100 MCG tablet; Take 1 tablet (100 mcg total) by mouth daily. -     rosuvastatin (CRESTOR) 20 MG tablet; Take 1 tablet (20 mg total) by mouth every evening. -     sodium bicarbonate 650 MG tablet; Take 1 tablet (650 mg total) by mouth 2 (two) times daily. -     alprostadil (EDEX) 40 MCG injection; 40 mcg by Intracavitary route as needed for erectile dysfunction. use no more than 3 times per week -     Insulin Glargine (LANTUS SOLOSTAR) 100 UNIT/ML Solostar Pen; Inject 10 Units into the skin daily at 10 pm.       I have discontinued [employer]iates's HUMULIN 70/30 and tamsulosin. I have also changed his allopurinol, AMITIZA, amLODipine, doxazosin, levothyroxine, and sodium bicarbonate. Additionally, I am having him start on alprostadil and Insulin Glargine. Lastly, I am having him maintain his betamethasone valerate, [employer]S  VITAMIN D, fluticasone, acetaminophen, ferrous sulfate, furosemide, isosorbide dinitrate, cloNIDine, carvedilol, hydrALAZINE, aspirin, finasteride, gabapentin, and rosuvastatin.  Allergies as of 03/10/2017      Reactions   Sulfa Antibiotics Hives      Medication List        Accurate as of 03/10/17  5:57 PM. Always use your most recent med list.          acetaminophen 325 MG tablet Commonly known as:  TYLENOL Take 2 tablets (650 mg total) by mouth every 6 (six) hours as needed for mild pain.   allopurinol 100 MG tablet Commonly known as:  ZYLOPRIM Take 1 tablet (100 mg total) by mouth daily.   alprostadil 40 MCG injection Commonly known as:  EDEX 40 mcg by Intracavitary route as needed for erectile dysfunction. use no more than 3 times per week   AMITIZA 24 MCG capsule Generic drug:  lubiprostone Take 1 capsule (24 mcg total) by mouth 2 (two) times daily.   amLODipine 10 MG tablet Commonly known as:  NORVASC Take 1 tablet (10 mg total) by mouth daily.   aspirin 500 MG tablet Take 500 mg by mouth every 6 (six) hours as needed for pain.   betamethasone valerate 0.1 % cream Commonly known as:  VALISONE Apply 1 application topically daily.   carvedilol 25 MG tablet Commonly known as:  COREG TAKE 1 & 1/2 TABLET BY MOUTH TWICE A DAY   cloNIDine 0.1 MG tablet Commonly known as:  CATAPRES Take 0.1 mg ( 2 tablets )  three times a day   [employer]S VITAMIN D 2000 units Caps Generic drug:  Cholecalciferol Take 1 capsule by mouth daily.   doxazosin 4 MG tablet Commonly known as:  CARDURA Take 1 tablet (4 mg total) by mouth 2 (two) times daily.   ferrous sulfate 325 (65 FE) MG tablet Take 1  tablet (325 mg total) by mouth daily with breakfast.   finasteride 5 MG tablet Commonly known as:  PROSCAR Take 5 mg by mouth daily.   fluticasone 50 MCG/ACT nasal spray Commonly known as:  FLONASE USE 2 SPRAYS IN EACH NOSTRIL AT BEDTIME   furosemide 40 MG tablet Commonly known as:   LASIX Take 1 tablet (40 mg total) by mouth daily.   gabapentin 100 MG capsule Commonly known as:  NEURONTIN Take 100 mg by mouth 3 (three) times daily.   hydrALAZINE 100 MG tablet Commonly known as:  APRESOLINE Take 1 tablet (100 mg total) by mouth 3 (three) times daily.   Insulin Glargine 100 UNIT/ML Solostar Pen Commonly known as:  LANTUS SOLOSTAR Inject 10 Units into the skin daily at 10 pm.   isosorbide dinitrate 20 MG tablet Commonly known as:  ISORDIL Take 2 tablets (40 mg total) by mouth 3 (three) times daily.   levothyroxine 100 MCG tablet Commonly known as:  SYNTHROID, LEVOTHROID Take 1 tablet (100 mcg total) by mouth daily.   rosuvastatin 20 MG tablet Commonly known as:  CRESTOR Take 1 tablet (20 mg total) by mouth every evening.   sodium bicarbonate 650 MG tablet Take 1 tablet (650 mg total) by mouth 2 (two) times daily.      Diabetes is complicated by multiple factors which are contributing to his risk of hypoglycemia which could in turn induce a new heart attack or stroke.  Most importantly he has stage IV renal insufficiency that limits treatment options to insulin and very little else.  He has not elevated A1c at 8.9 today that indicates intervention is essential.  Additionally his tendency toward heart failure can be exacerbated easily by disturbing the balance of his fluid status.  As a result I am recommending that he discontinue the Humulin 70/30 and start on Lantus 10 units daily.  He should check his blood glucose fasting and postprandial.  If his glucose remains over 200 after the first week then we need to increase that to 15 units.  Unfortunately he has a strong tendency toward hypoglycemia.  I believe this is related to the use of the 7030 as well as his comorbidities.  Switching to Lantus should help, however I believe it is important to move rather slowly and titrated his dose using multiple glucose readings to avoid life-threatening complications of  hypoglycemia.  We will need to monitor his renal function as well along with his renal specialist.  I believe it is critical to have his TSH and free T4 as ideal as possible in order to allow optimum cardiac and renal function as well as a response to his insulin.  Records are pending from his previous primary doctor.  All of his records from cardiology have been reviewed.  Will ask for records from Dr. Lowanda Foster, nephrology as well.  He declined to have a chest x-ray today because his previous physician had done 1 within the last 3 months.  Should the x-ray from those records are not looks stable I may ask him to come back in once those have been reviewed  Follow-up: Return in about 1 month (around 04/10/2017).  Claretta Fraise, M.D.

## 2017-03-11 LAB — CBC WITH DIFFERENTIAL/PLATELET
BASOS: 0 %
Basophils Absolute: 0 10*3/uL (ref 0.0–0.2)
EOS (ABSOLUTE): 0.2 10*3/uL (ref 0.0–0.4)
EOS: 4 %
HEMATOCRIT: 28.8 % — AB (ref 37.5–51.0)
HEMOGLOBIN: 8.9 g/dL — AB (ref 13.0–17.7)
IMMATURE GRANULOCYTES: 0 %
Immature Grans (Abs): 0 10*3/uL (ref 0.0–0.1)
LYMPHS ABS: 1.1 10*3/uL (ref 0.7–3.1)
Lymphs: 25 %
MCH: 28.7 pg (ref 26.6–33.0)
MCHC: 30.9 g/dL — AB (ref 31.5–35.7)
MCV: 93 fL (ref 79–97)
MONOCYTES: 5 %
Monocytes Absolute: 0.2 10*3/uL (ref 0.1–0.9)
Neutrophils Absolute: 2.9 10*3/uL (ref 1.4–7.0)
Neutrophils: 66 %
Platelets: 161 10*3/uL (ref 150–379)
RBC: 3.1 x10E6/uL — AB (ref 4.14–5.80)
RDW: 15.3 % (ref 12.3–15.4)
WBC: 4.3 10*3/uL (ref 3.4–10.8)

## 2017-03-11 LAB — CMP14+EGFR
ALBUMIN: 4 g/dL (ref 3.5–4.8)
ALT: 10 IU/L (ref 0–44)
AST: 10 IU/L (ref 0–40)
Albumin/Globulin Ratio: 1.1 — ABNORMAL LOW (ref 1.2–2.2)
Alkaline Phosphatase: 65 IU/L (ref 39–117)
BUN / CREAT RATIO: 18 (ref 10–24)
BUN: 80 mg/dL (ref 8–27)
Bilirubin Total: <0.2 mg/dL (ref 0.0–1.2)
CALCIUM: 9.5 mg/dL (ref 8.6–10.2)
CO2: 21 mmol/L (ref 20–29)
CREATININE: 4.39 mg/dL — AB (ref 0.76–1.27)
Chloride: 98 mmol/L (ref 96–106)
GFR, EST AFRICAN AMERICAN: 14 mL/min/{1.73_m2} — AB (ref >59)
GFR, EST NON AFRICAN AMERICAN: 13 mL/min/{1.73_m2} — AB (ref >59)
GLOBULIN, TOTAL: 3.6 g/dL (ref 1.5–4.5)
Glucose: 266 mg/dL — ABNORMAL HIGH (ref 65–99)
Potassium: 4.2 mmol/L (ref 3.5–5.2)
SODIUM: 136 mmol/L (ref 134–144)
Total Protein: 7.6 g/dL (ref 6.0–8.5)

## 2017-03-11 LAB — TSH: TSH: <0.006 u[IU]/mL — ABNORMAL LOW (ref 0.450–4.500)

## 2017-03-13 ENCOUNTER — Other Ambulatory Visit: Payer: Self-pay | Admitting: *Deleted

## 2017-03-13 DIAGNOSIS — E039 Hypothyroidism, unspecified: Secondary | ICD-10-CM

## 2017-03-14 ENCOUNTER — Telehealth: Payer: Self-pay | Admitting: *Deleted

## 2017-03-14 NOTE — Telephone Encounter (Signed)
Spoke with Dr Livia Snellen and he said it was ok to use the Humulin since he has that much on hand. Lantus taken out of chart and Humulin added. Pt aware of MD feedback.

## 2017-03-14 NOTE — Telephone Encounter (Signed)
Pt came in the office to discuss the change in insulin. Per pt he has a 2 year supply of the 70/30 and does not want to change insulin at this time Pt wants to discuss this Please call pt

## 2017-03-15 LAB — SPECIMEN STATUS REPORT

## 2017-03-15 LAB — T4, FREE: Free T4: 1.21 ng/dL (ref 0.82–1.77)

## 2017-03-30 DIAGNOSIS — N184 Chronic kidney disease, stage 4 (severe): Secondary | ICD-10-CM | POA: Diagnosis not present

## 2017-03-30 DIAGNOSIS — D631 Anemia in chronic kidney disease: Secondary | ICD-10-CM | POA: Diagnosis not present

## 2017-04-13 DIAGNOSIS — D631 Anemia in chronic kidney disease: Secondary | ICD-10-CM | POA: Diagnosis not present

## 2017-04-13 DIAGNOSIS — N184 Chronic kidney disease, stage 4 (severe): Secondary | ICD-10-CM | POA: Diagnosis not present

## 2017-04-27 DIAGNOSIS — Z79899 Other long term (current) drug therapy: Secondary | ICD-10-CM | POA: Diagnosis not present

## 2017-04-27 DIAGNOSIS — E559 Vitamin D deficiency, unspecified: Secondary | ICD-10-CM | POA: Diagnosis not present

## 2017-04-27 DIAGNOSIS — N184 Chronic kidney disease, stage 4 (severe): Secondary | ICD-10-CM | POA: Diagnosis not present

## 2017-04-27 DIAGNOSIS — D631 Anemia in chronic kidney disease: Secondary | ICD-10-CM | POA: Diagnosis not present

## 2017-04-27 DIAGNOSIS — R809 Proteinuria, unspecified: Secondary | ICD-10-CM | POA: Diagnosis not present

## 2017-04-28 DIAGNOSIS — H35373 Puckering of macula, bilateral: Secondary | ICD-10-CM | POA: Diagnosis not present

## 2017-04-28 DIAGNOSIS — H04123 Dry eye syndrome of bilateral lacrimal glands: Secondary | ICD-10-CM | POA: Diagnosis not present

## 2017-04-28 DIAGNOSIS — Z794 Long term (current) use of insulin: Secondary | ICD-10-CM | POA: Diagnosis not present

## 2017-04-28 DIAGNOSIS — E119 Type 2 diabetes mellitus without complications: Secondary | ICD-10-CM | POA: Diagnosis not present

## 2017-04-28 LAB — HM DIABETES EYE EXAM

## 2017-05-02 DIAGNOSIS — R809 Proteinuria, unspecified: Secondary | ICD-10-CM | POA: Diagnosis not present

## 2017-05-02 DIAGNOSIS — N184 Chronic kidney disease, stage 4 (severe): Secondary | ICD-10-CM | POA: Diagnosis not present

## 2017-05-02 DIAGNOSIS — E1129 Type 2 diabetes mellitus with other diabetic kidney complication: Secondary | ICD-10-CM | POA: Diagnosis not present

## 2017-05-02 DIAGNOSIS — I10 Essential (primary) hypertension: Secondary | ICD-10-CM | POA: Diagnosis not present

## 2017-05-11 DIAGNOSIS — N184 Chronic kidney disease, stage 4 (severe): Secondary | ICD-10-CM | POA: Diagnosis not present

## 2017-05-11 DIAGNOSIS — E559 Vitamin D deficiency, unspecified: Secondary | ICD-10-CM | POA: Diagnosis not present

## 2017-05-11 DIAGNOSIS — Z79899 Other long term (current) drug therapy: Secondary | ICD-10-CM | POA: Diagnosis not present

## 2017-05-11 DIAGNOSIS — D631 Anemia in chronic kidney disease: Secondary | ICD-10-CM | POA: Diagnosis not present

## 2017-05-11 DIAGNOSIS — R809 Proteinuria, unspecified: Secondary | ICD-10-CM | POA: Diagnosis not present

## 2017-05-25 DIAGNOSIS — R809 Proteinuria, unspecified: Secondary | ICD-10-CM | POA: Diagnosis not present

## 2017-05-25 DIAGNOSIS — D631 Anemia in chronic kidney disease: Secondary | ICD-10-CM | POA: Diagnosis not present

## 2017-05-25 DIAGNOSIS — Z79899 Other long term (current) drug therapy: Secondary | ICD-10-CM | POA: Diagnosis not present

## 2017-05-25 DIAGNOSIS — N184 Chronic kidney disease, stage 4 (severe): Secondary | ICD-10-CM | POA: Diagnosis not present

## 2017-05-25 DIAGNOSIS — E559 Vitamin D deficiency, unspecified: Secondary | ICD-10-CM | POA: Diagnosis not present

## 2017-06-01 ENCOUNTER — Ambulatory Visit (INDEPENDENT_AMBULATORY_CARE_PROVIDER_SITE_OTHER): Payer: Medicare Other | Admitting: Family Medicine

## 2017-06-01 ENCOUNTER — Encounter: Payer: Self-pay | Admitting: Family Medicine

## 2017-06-01 VITALS — BP 152/54 | HR 60 | Temp 96.5°F | Ht 73.0 in | Wt 216.4 lb

## 2017-06-01 DIAGNOSIS — R3989 Other symptoms and signs involving the genitourinary system: Secondary | ICD-10-CM | POA: Diagnosis not present

## 2017-06-01 DIAGNOSIS — N184 Chronic kidney disease, stage 4 (severe): Secondary | ICD-10-CM | POA: Diagnosis not present

## 2017-06-01 DIAGNOSIS — E1022 Type 1 diabetes mellitus with diabetic chronic kidney disease: Secondary | ICD-10-CM

## 2017-06-01 LAB — URINALYSIS, COMPLETE
Bilirubin, UA: NEGATIVE
KETONES UA: NEGATIVE
LEUKOCYTES UA: NEGATIVE
Nitrite, UA: NEGATIVE
PH UA: 5.5 (ref 5.0–7.5)
RBC UA: NEGATIVE
SPEC GRAV UA: 1.01 (ref 1.005–1.030)
UUROB: 0.2 mg/dL (ref 0.2–1.0)

## 2017-06-01 LAB — MICROSCOPIC EXAMINATION
Epithelial Cells (non renal): NONE SEEN /hpf (ref 0–10)
RBC MICROSCOPIC, UA: NONE SEEN /HPF (ref 0–2)
Renal Epithel, UA: NONE SEEN /hpf
WBC, UA: NONE SEEN /hpf (ref 0–5)

## 2017-06-01 LAB — GLUCOSE HEMOCUE WAIVED: GLU HEMOCUE WAIVED: 145 mg/dL — AB (ref 65–99)

## 2017-06-01 MED ORDER — AMOXICILLIN 875 MG PO TABS: 875.0000 mg | ORAL_TABLET | Freq: Two times a day (BID) | ORAL | 0 refills | Status: DC

## 2017-06-01 MED ORDER — INSULIN GLARGINE 100 UNIT/ML SOLOSTAR PEN: 10.0000 [IU] | PEN_INJECTOR | Freq: Every day | SUBCUTANEOUS | 99 refills | Status: DC

## 2017-06-01 NOTE — Progress Notes (Signed)
Subjective:  Patient ID: [name], male    DOB: 10/30/1945  Age: 72 y.o. MRN: [id number]  CC: bladder pain (pt here today c/o "bladder pain" )   HPI [name] presents for increasing sensation of urinary urgency ongoing for about a month.  He feels the pain in the groin area that occurs when he is turning and bending and twisting.  Patient is a diabetic who is past due for follow-up as well.  While he was here he did agree to go ahead with diabetes follow-up.  Of note is that he has significant renal insufficiency.  His baseline creatinine is approximately 4.  He was recently hospitalized for MI.  Patient was encouraged to follow-up closely for his diabetes and in fact has not been doing so therefore we went ahead with his diabetes checkup while he was here.  He is not been checking his blood glucose regularly.  Depression screen [facility] 2/9 06/01/2017 03/10/2017  Decreased Interest 2 0  Down, Depressed, Hopeless 2 0  PHQ - 2 Score 4 0  Altered sleeping 1 -  Tired, decreased energy 2 -  Change in appetite 2 -  Feeling bad or failure about yourself  3 -  Trouble concentrating 2 -  Moving slowly or fidgety/restless 2 -  Suicidal thoughts 0 -  PHQ-9 Score 16 -    History [name] has a past medical history of CAD (coronary artery disease), native coronary artery, Carotid artery occlusion, Chronic kidney disease, Diabetes mellitus without complication ([place]), and Hypertension.   He has a past surgical history that includes Lumbar fusion (2005); LEFT HEART CATH AND CORONARY ANGIOGRAPHY (N/A, 07/28/2016); Coronary artery bypass graft (N/A, 08/05/2016); TEE without cardioversion (N/A, 08/05/2016); Joint replacement (Bilateral); Appendectomy; and Tonsilectomy, adenoidectomy, bilateral myringotomy and tubes.   His family history includes Arthritis in his father, maternal grandfather, maternal grandmother, mother, paternal grandfather, and paternal grandmother; Asthma in his sister; Cancer in his  brother, brother, father, and mother; Diabetes in his brother, father, and mother; Heart disease in his mother; Hyperlipidemia in his brother, brother, brother, father, mother, sister, and sister; Hypertension in his brother, brother, brother, father, mother, sister, and sister; Kidney disease in his mother; Vision loss in his sister.He reports that he has quit smoking. He has never used smokeless tobacco. He reports that he does not drink alcohol or use drugs.    ROS Review of Systems  Constitutional: Negative.   HENT: Negative.   Eyes: Negative for visual disturbance.  Respiratory: Negative for cough and shortness of breath.   Cardiovascular: Negative for chest pain and leg swelling.  Gastrointestinal: Negative for abdominal pain, diarrhea, nausea and vomiting.  Genitourinary: Positive for difficulty urinating, dysuria, frequency and urgency. Negative for hematuria and testicular pain.  Musculoskeletal: Negative for arthralgias and myalgias.  Skin: Negative for rash.  Neurological: Negative for headaches.  Psychiatric/Behavioral: Negative for sleep disturbance.    Objective:  BP (!) 152/54   Pulse 60   Temp (!) 96.5 F (35.8 C) (Oral)   Ht 6' 1"  (1.854 m)   Wt 216 lb 6 oz (98.1 kg)   BMI 28.55 kg/m   BP Readings from Last 3 Encounters:  06/01/17 (!) 152/54  03/10/17 (!) 132/47  01/30/17 (!) 162/59    Wt Readings from Last 3 Encounters:  06/01/17 216 lb 6 oz (98.1 kg)  03/10/17 216 lb (98 kg)  01/30/17 212 lb (96.2 kg)     Physical Exam  Constitutional: He is oriented to person,  place, and time. He appears well-developed and well-nourished. No distress.  HENT:  Head: Normocephalic and atraumatic.  Right Ear: External ear normal.  Left Ear: External ear normal.  Nose: Nose normal.  Mouth/Throat: Oropharynx is clear and moist.  Eyes: Pupils are equal, round, and reactive to light. Conjunctivae and EOM are normal.  Neck: Normal range of motion. Neck supple. No  thyromegaly present.  Cardiovascular: Normal rate, regular rhythm and normal heart sounds.  No murmur heard. Pulmonary/Chest: Effort normal and breath sounds normal. No respiratory distress. He has no wheezes. He has no rales.  Abdominal: Soft. Bowel sounds are normal. He exhibits no distension. There is no tenderness.  Lymphadenopathy:    He has no cervical adenopathy.  Neurological: He is alert and oriented to person, place, and time. He has normal reflexes.  Skin: Skin is warm and dry.  Psychiatric: He has a normal mood and affect. His behavior is normal. Judgment and thought content normal.   Results for orders placed or performed in visit on 06/01/17  Urine Culture  Result Value Ref Range   Urine Culture, Routine Final report (A)    Organism ID, Bacteria Comment (A)    Antimicrobial Susceptibility Comment   Microscopic Examination  Result Value Ref Range   WBC, UA None seen 0 - 5 /hpf   RBC, UA None seen 0 - 2 /hpf   Epithelial Cells (non renal) None seen 0 - 10 /hpf   Renal Epithel, UA None seen None seen /hpf   Mucus, UA Present Not Estab.   Bacteria, UA Many (A) None seen/Few  Urinalysis, Complete  Result Value Ref Range   Specific Gravity, UA 1.010 1.005 - 1.030   pH, UA 5.5 5.0 - 7.5   Color, UA Yellow Yellow   Appearance Ur Clear Clear   Leukocytes, UA Negative Negative   Protein, UA 2+ (A) Negative/Trace   Glucose, UA 1+ (A) Negative   Ketones, UA Negative Negative   RBC, UA Negative Negative   Bilirubin, UA Negative Negative   Urobilinogen, Ur 0.2 0.2 - 1.0 mg/dL   Nitrite, UA Negative Negative   Microscopic Examination See below:   CMP14+EGFR  Result Value Ref Range   Glucose 120 (H) 65 - 99 mg/dL   BUN 63 (H) 8 - 27 mg/dL   Creatinine, Ser 4.34 (HH) 0.76 - 1.27 mg/dL   GFR calc non Af Amer 13 (L) >59 mL/min/1.73   GFR calc Af Amer 15 (L) >59 mL/min/1.73   BUN/Creatinine Ratio 15 10 - 24   Sodium 139 134 - 144 mmol/L   Potassium 4.1 3.5 - 5.2 mmol/L    Chloride 102 96 - 106 mmol/L   CO2 21 20 - 29 mmol/L   Calcium 9.6 8.6 - 10.2 mg/dL   Total Protein 7.9 6.0 - 8.5 g/dL   Albumin 4.4 3.5 - 4.8 g/dL   Globulin, Total 3.5 1.5 - 4.5 g/dL   Albumin/Globulin Ratio 1.3 1.2 - 2.2   Bilirubin Total 0.3 0.0 - 1.2 mg/dL   Alkaline Phosphatase 74 39 - 117 IU/L   AST 13 0 - 40 IU/L   ALT 9 0 - 44 IU/L  Glucose Hemocue Waived  Result Value Ref Range   Glu Hemocue Waived 145 (H) 65 - 99 mg/dL      Assessment & Plan:   [name] was seen today for bladder pain.  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 -     amoxicillin (AMOXIL) 875 MG tablet; Take 1 tablet (875 mg total) by mouth 2 (two) times daily. -     Microscopic Examination    I have discontinued Abdulai L. Ezzell's insulin NPH-regular Human. I am also having him start on Insulin Glargine and amoxicillin. Additionally, I am having him maintain his betamethasone valerate, CVS VITAMIN D, fluticasone, acetaminophen, ferrous sulfate, furosemide, isosorbide dinitrate, cloNIDine, carvedilol, hydrALAZINE, aspirin, finasteride, gabapentin, allopurinol, AMITIZA, amLODipine, doxazosin, levothyroxine, rosuvastatin, sodium bicarbonate, and alprostadil.  Allergies as of 06/01/2017      Reactions   Sulfa Antibiotics Hives      Medication List        Accurate as of 06/01/17 11:59 PM. Always use your most recent med list.          acetaminophen 325 MG tablet Commonly known as:  TYLENOL Take 2 tablets (650 mg total) by mouth every 6 (six) hours as needed for mild pain.   allopurinol 100 MG tablet Commonly  known as:  ZYLOPRIM Take 1 tablet (100 mg total) by mouth daily.   alprostadil 40 MCG injection Commonly known as:  EDEX 40 mcg by Intracavitary route as needed for erectile dysfunction. use no more than 3 times per week   AMITIZA 24 MCG capsule Generic drug:  lubiprostone Take 1 capsule (24 mcg total) by mouth 2 (two) times daily.   amLODipine 10 MG tablet Commonly known as:  NORVASC Take 1 tablet (10 mg total) by mouth daily.   amoxicillin 875 MG tablet Commonly known as:  AMOXIL Take 1 tablet (875 mg total) by mouth 2 (two) times daily.   aspirin 500 MG tablet Take 500 mg by mouth every 6 (six) hours as needed for pain.   betamethasone valerate 0.1 % cream Commonly known as:  VALISONE Apply 1 application topically daily.   carvedilol 25 MG tablet Commonly known as:  COREG TAKE 1 & 1/2 TABLET BY MOUTH TWICE A DAY   cloNIDine 0.1 MG tablet Commonly known as:  CATAPRES Take 0.1 mg ( 2 tablets )  three times a day   CVS VITAMIN D 2000 units Caps Generic drug:  Cholecalciferol Take 1 capsule by mouth daily.   doxazosin 4 MG tablet Commonly known as:  CARDURA Take 1 tablet (4 mg total) by mouth 2 (two) times daily.   ferrous sulfate 325 (65 FE) MG tablet Take 1 tablet (325 mg total) by mouth daily with breakfast.   finasteride 5 MG tablet Commonly known as:  PROSCAR Take 5 mg by mouth daily.   fluticasone 50 MCG/ACT nasal spray Commonly known as:  FLONASE USE 2 SPRAYS IN EACH NOSTRIL AT BEDTIME   furosemide 40 MG tablet Commonly known as:  LASIX Take 1 tablet (40 mg total) by mouth daily.   gabapentin 100 MG capsule Commonly known as:  NEURONTIN Take 100 mg by mouth 3 (three) times daily.   hydrALAZINE 100 MG tablet Commonly known as:  APRESOLINE Take 1 tablet (100 mg total) by mouth 3 (three) times daily.   Insulin Glargine 100 UNIT/ML Solostar  Pen Commonly known as:  LANTUS SOLOSTAR Inject 10 Units into the skin daily at 10 pm.   isosorbide  dinitrate 20 MG tablet Commonly known as:  ISORDIL Take 2 tablets (40 mg total) by mouth 3 (three) times daily.   levothyroxine 100 MCG tablet Commonly known as:  SYNTHROID, LEVOTHROID Take 1 tablet (100 mcg total) by mouth daily.   rosuvastatin 20 MG tablet Commonly known as:  CRESTOR Take 1 tablet (20 mg total) by mouth every evening.   sodium bicarbonate 650 MG tablet Take 1 tablet (650 mg total) by mouth 2 (two) times daily.        Follow-up: Return in about 1 month (around 07/01/2017).  Claretta Fraise, M.D.

## 2017-06-02 LAB — CMP14+EGFR
A/G RATIO: 1.3 (ref 1.2–2.2)
ALK PHOS: 74 IU/L (ref 39–117)
ALT: 9 IU/L (ref 0–44)
AST: 13 IU/L (ref 0–40)
Albumin: 4.4 g/dL (ref 3.5–4.8)
BILIRUBIN TOTAL: 0.3 mg/dL (ref 0.0–1.2)
BUN/Creatinine Ratio: 15 (ref 10–24)
BUN: 63 mg/dL — ABNORMAL HIGH (ref 8–27)
CHLORIDE: 102 mmol/L (ref 96–106)
CO2: 21 mmol/L (ref 20–29)
Calcium: 9.6 mg/dL (ref 8.6–10.2)
Creatinine, Ser: 4.34 mg/dL (ref 0.76–1.27)
GFR calc Af Amer: 15 mL/min/{1.73_m2} — ABNORMAL LOW (ref >59)
GFR calc non Af Amer: 13 mL/min/{1.73_m2} — ABNORMAL LOW (ref >59)
GLUCOSE: 120 mg/dL — AB (ref 65–99)
Globulin, Total: 3.5 g/dL (ref 1.5–4.5)
POTASSIUM: 4.1 mmol/L (ref 3.5–5.2)
Sodium: 139 mmol/L (ref 134–144)
TOTAL PROTEIN: 7.9 g/dL (ref 6.0–8.5)

## 2017-06-03 LAB — URINE CULTURE

## 2017-06-05 ENCOUNTER — Encounter: Payer: Self-pay | Admitting: Family Medicine

## 2017-06-08 ENCOUNTER — Encounter: Payer: Self-pay | Admitting: Family Medicine

## 2017-06-08 DIAGNOSIS — N184 Chronic kidney disease, stage 4 (severe): Secondary | ICD-10-CM | POA: Diagnosis not present

## 2017-06-08 DIAGNOSIS — R809 Proteinuria, unspecified: Secondary | ICD-10-CM | POA: Diagnosis not present

## 2017-06-08 DIAGNOSIS — E559 Vitamin D deficiency, unspecified: Secondary | ICD-10-CM | POA: Diagnosis not present

## 2017-06-08 DIAGNOSIS — D631 Anemia in chronic kidney disease: Secondary | ICD-10-CM | POA: Diagnosis not present

## 2017-06-08 DIAGNOSIS — Z79899 Other long term (current) drug therapy: Secondary | ICD-10-CM | POA: Diagnosis not present

## 2017-06-09 ENCOUNTER — Encounter: Payer: Self-pay | Admitting: Family Medicine

## 2017-06-09 ENCOUNTER — Ambulatory Visit (INDEPENDENT_AMBULATORY_CARE_PROVIDER_SITE_OTHER): Payer: Medicare Other | Admitting: Family Medicine

## 2017-06-09 VITALS — BP 160/54 | HR 65 | Temp 97.4°F | Ht 73.0 in | Wt 221.1 lb

## 2017-06-09 DIAGNOSIS — E1022 Type 1 diabetes mellitus with diabetic chronic kidney disease: Secondary | ICD-10-CM

## 2017-06-09 DIAGNOSIS — N184 Chronic kidney disease, stage 4 (severe): Secondary | ICD-10-CM

## 2017-06-09 DIAGNOSIS — R3989 Other symptoms and signs involving the genitourinary system: Secondary | ICD-10-CM | POA: Diagnosis not present

## 2017-06-09 LAB — URINALYSIS
Bilirubin, UA: NEGATIVE
KETONES UA: NEGATIVE
Leukocytes, UA: NEGATIVE
Nitrite, UA: NEGATIVE
RBC, UA: NEGATIVE
Specific Gravity, UA: 1.01 (ref 1.005–1.030)
Urobilinogen, Ur: 0.2 mg/dL (ref 0.2–1.0)
pH, UA: 5.5 (ref 5.0–7.5)

## 2017-06-09 LAB — BAYER DCA HB A1C WAIVED: HB A1C: 10.1 % — AB (ref <7.0)

## 2017-06-09 MED ORDER — INSULIN GLARGINE 100 UNIT/ML SOLOSTAR PEN: 20.0000 [IU] | PEN_INJECTOR | Freq: Every day | SUBCUTANEOUS | 99 refills | Status: DC

## 2017-06-09 NOTE — Progress Notes (Signed)
Subjective:  Patient ID: [name], male    DOB: 05-Nov-1945  [name]: 72 y.o. MRN: [id number]  CC: Follow-up (pt here today for routine follow up of his chronic medical conditions)   HPI [name] presents forFollow-up of diabetes. Patient checks blood sugar at home. This week readings have been recorded daily. Fasting readingss are: 759,163,846,65,99,357,01,779,390,300.He DCed the NPH based insulin and Started Lantus 10 units daily. He says he feels better. He continues to follow with his nephrologist. Has an appt. In under 2 weeks.  Pt. Denies sx with the lower numbers noted above - or at other times this week either.   History [name] has a past medical history of CAD (coronary artery disease), native coronary artery, Carotid artery occlusion, Chronic kidney disease, Diabetes mellitus without complication ([place]), and Hypertension.   He has a past surgical history that includes Lumbar fusion (2005); LEFT HEART CATH AND CORONARY ANGIOGRAPHY (N/A, 07/28/2016); Coronary artery bypass graft (N/A, 08/05/2016); TEE without cardioversion (N/A, 08/05/2016); Joint replacement (Bilateral); Appendectomy; and Tonsilectomy, adenoidectomy, bilateral myringotomy and tubes.   His family history includes Arthritis in his father, maternal grandfather, maternal grandmother, mother, paternal grandfather, and paternal grandmother; Asthma in his sister; Cancer in his brother, brother, father, and mother; Diabetes in his brother, father, and mother; Heart disease in his mother; Hyperlipidemia in his brother, brother, brother, father, mother, sister, and sister; Hypertension in his brother, brother, brother, father, mother, sister, and sister; Kidney disease in his mother; Vision loss in his sister.He reports that he has quit smoking. He has never used smokeless tobacco. He reports that he does not drink alcohol or use drugs.  Current Outpatient Medications on File Prior to Visit  Medication Sig Dispense Refill  .  acetaminophen (TYLENOL) 325 MG tablet Take 2 tablets (650 mg total) by mouth every 6 (six) hours as needed for mild pain.    Marland Kitchen allopurinol (ZYLOPRIM) 100 MG tablet Take 1 tablet (100 mg total) by mouth daily. 90 tablet 1  . alprostadil (EDEX) 40 MCG injection 40 mcg by Intracavitary route as needed for erectile dysfunction. use no more than 3 times per week 1 each 12  . AMITIZA 24 MCG capsule Take 1 capsule (24 mcg total) by mouth 2 (two) times daily. 180 capsule 1  . amLODipine (NORVASC) 10 MG tablet Take 1 tablet (10 mg total) by mouth daily. 90 tablet 1  . amoxicillin (AMOXIL) 875 MG tablet Take 1 tablet (875 mg total) by mouth 2 (two) times daily. 14 tablet 0  . aspirin 500 MG tablet Take 500 mg by mouth every 6 (six) hours as needed for pain.    Marland Kitchen betamethasone valerate (VALISONE) 0.1 % cream Apply 1 application topically daily.   99  . carvedilol (COREG) 25 MG tablet TAKE 1 & 1/2 TABLET BY MOUTH TWICE A DAY 270 tablet 3  . cloNIDine (CATAPRES) 0.1 MG tablet Take 0.1 mg ( 2 tablets )  three times a day 180 tablet 6  . CVS VITAMIN D 2000 units CAPS Take 1 capsule by mouth daily.  5  . doxazosin (CARDURA) 4 MG tablet Take 1 tablet (4 mg total) by mouth 2 (two) times daily. 180 tablet 1  . ferrous sulfate 325 (65 FE) MG tablet Take 1 tablet (325 mg total) by mouth daily with breakfast.  3  . finasteride (PROSCAR) 5 MG tablet Take 5 mg by mouth daily.  3  . fluticasone (FLONASE) 50 MCG/ACT nasal spray USE 2 SPRAYS IN EACH NOSTRIL  AT BEDTIME  9  . furosemide (LASIX) 40 MG tablet Take 1 tablet (40 mg total) by mouth daily.    Marland Kitchen gabapentin (NEURONTIN) 100 MG capsule Take 100 mg by mouth 3 (three) times daily.  3  . hydrALAZINE (APRESOLINE) 100 MG tablet Take 1 tablet (100 mg total) by mouth 3 (three) times daily. 270 tablet 3  . isosorbide dinitrate (ISORDIL) 20 MG tablet Take 2 tablets (40 mg total) by mouth 3 (three) times daily. 180 tablet 5  . levothyroxine (SYNTHROID, LEVOTHROID) 100 MCG tablet  Take 1 tablet (100 mcg total) by mouth daily. 90 tablet 1  . rosuvastatin (CRESTOR) 20 MG tablet Take 1 tablet (20 mg total) by mouth every evening. 90 tablet 1  . sodium bicarbonate 650 MG tablet Take 1 tablet (650 mg total) by mouth 2 (two) times daily. 180 tablet 1   No current facility-administered medications on file prior to visit.     ROS Review of Systems  Constitutional: Positive for fatigue. Negative for activity change and appetite change.  Respiratory: Negative for shortness of breath.   Cardiovascular: Negative for chest pain.  Gastrointestinal: Negative for abdominal pain.  Genitourinary: Negative for dysuria and flank pain.    Objective:  BP (!) 160/54   Pulse 65   Temp (!) 97.4 F (36.3 C) (Oral)   Ht 6\' 1"  (1.854 m)   Wt 221 lb 2 oz (100.3 kg)   BMI 29.17 kg/m   BP Readings from Last 3 Encounters:  06/09/17 (!) 160/54  06/01/17 (!) 152/54  03/10/17 (!) 132/47    Wt Readings from Last 3 Encounters:  06/09/17 221 lb 2 oz (100.3 kg)  06/01/17 216 lb 6 oz (98.1 kg)  03/10/17 216 lb (98 kg)     Physical Exam  Constitutional: He appears well-developed and well-nourished.  HENT:  Head: Normocephalic and atraumatic.  Right Ear: Tympanic membrane and external ear normal. No decreased hearing is noted.  Left Ear: Tympanic membrane and external ear normal. No decreased hearing is noted.  Mouth/Throat: No oropharyngeal exudate or posterior oropharyngeal erythema.  Eyes: Pupils are equal, round, and reactive to light.  Neck: Normal range of motion. Neck supple.  Cardiovascular: Normal rate and regular rhythm.  No murmur heard. Pulmonary/Chest: Breath sounds normal. No respiratory distress.  Abdominal: Soft. Bowel sounds are normal. He exhibits no mass. There is no tenderness.  Vitals reviewed.     Assessment & Plan:   [name] was seen today for follow-up.  Diagnoses and all orders for this visit:  Type 1 diabetes mellitus with stage 4 chronic kidney  disease (HCC) -     Bayer DCA Hb A1c Waived -     Insulin Glargine (LANTUS SOLOSTAR) 100 UNIT/ML Solostar Pen; Inject 20 Units into the skin daily at 10 pm.  Bladder pain -     Urinalysis -     Urine Culture      I have changed [name]'s Insulin Glargine. I am also having him maintain his betamethasone valerate, CVS VITAMIN D, fluticasone, acetaminophen, ferrous sulfate, furosemide, isosorbide dinitrate, cloNIDine, carvedilol, hydrALAZINE, aspirin, finasteride, gabapentin, allopurinol, AMITIZA, amLODipine, doxazosin, levothyroxine, rosuvastatin, sodium bicarbonate, alprostadil, and amoxicillin.  Meds ordered this encounter  Medications  . Insulin Glargine (LANTUS SOLOSTAR) 100 UNIT/ML Solostar Pen    Sig: Inject 20 Units into the skin daily at 10 pm.    Dispense:  5 pen    Refill:  PRN   Will monitor pt. Weekly until his fasting glucose is  consistently between 80-125. Continue to emphasize diet,carb control  Follow-up: Return in about 1 week (around 06/16/2017).  Claretta Fraise, M.D.

## 2017-06-10 LAB — URINE CULTURE: ORGANISM ID, BACTERIA: NO GROWTH

## 2017-06-20 DIAGNOSIS — N184 Chronic kidney disease, stage 4 (severe): Secondary | ICD-10-CM | POA: Diagnosis not present

## 2017-06-20 DIAGNOSIS — I10 Essential (primary) hypertension: Secondary | ICD-10-CM | POA: Diagnosis not present

## 2017-06-20 DIAGNOSIS — E1129 Type 2 diabetes mellitus with other diabetic kidney complication: Secondary | ICD-10-CM | POA: Diagnosis not present

## 2017-06-20 DIAGNOSIS — E872 Acidosis: Secondary | ICD-10-CM | POA: Diagnosis not present

## 2017-06-20 DIAGNOSIS — D649 Anemia, unspecified: Secondary | ICD-10-CM | POA: Diagnosis not present

## 2017-06-22 DIAGNOSIS — D631 Anemia in chronic kidney disease: Secondary | ICD-10-CM | POA: Diagnosis not present

## 2017-06-22 DIAGNOSIS — E559 Vitamin D deficiency, unspecified: Secondary | ICD-10-CM | POA: Diagnosis not present

## 2017-06-22 DIAGNOSIS — N184 Chronic kidney disease, stage 4 (severe): Secondary | ICD-10-CM | POA: Diagnosis not present

## 2017-06-22 DIAGNOSIS — Z79899 Other long term (current) drug therapy: Secondary | ICD-10-CM | POA: Diagnosis not present

## 2017-06-22 DIAGNOSIS — R809 Proteinuria, unspecified: Secondary | ICD-10-CM | POA: Diagnosis not present

## 2017-07-06 DIAGNOSIS — R809 Proteinuria, unspecified: Secondary | ICD-10-CM | POA: Diagnosis not present

## 2017-07-06 DIAGNOSIS — Z79899 Other long term (current) drug therapy: Secondary | ICD-10-CM | POA: Diagnosis not present

## 2017-07-06 DIAGNOSIS — N184 Chronic kidney disease, stage 4 (severe): Secondary | ICD-10-CM | POA: Diagnosis not present

## 2017-07-06 DIAGNOSIS — E559 Vitamin D deficiency, unspecified: Secondary | ICD-10-CM | POA: Diagnosis not present

## 2017-07-06 DIAGNOSIS — D631 Anemia in chronic kidney disease: Secondary | ICD-10-CM | POA: Diagnosis not present

## 2017-07-13 ENCOUNTER — Other Ambulatory Visit: Payer: Self-pay | Admitting: Internal Medicine

## 2017-07-14 ENCOUNTER — Encounter: Payer: Self-pay | Admitting: Family Medicine

## 2017-07-14 ENCOUNTER — Ambulatory Visit (INDEPENDENT_AMBULATORY_CARE_PROVIDER_SITE_OTHER): Payer: Medicare Other | Admitting: Family Medicine

## 2017-07-14 VITALS — BP 165/56 | HR 63 | Temp 97.3°F | Ht 73.0 in | Wt 224.1 lb

## 2017-07-14 DIAGNOSIS — I110 Hypertensive heart disease with heart failure: Secondary | ICD-10-CM | POA: Diagnosis not present

## 2017-07-14 DIAGNOSIS — I5042 Chronic combined systolic (congestive) and diastolic (congestive) heart failure: Secondary | ICD-10-CM

## 2017-07-14 DIAGNOSIS — E119 Type 2 diabetes mellitus without complications: Secondary | ICD-10-CM

## 2017-07-14 MED ORDER — HYDRALAZINE HCL 100 MG PO TABS: 100.0000 mg | ORAL_TABLET | Freq: Three times a day (TID) | ORAL | 3 refills | Status: DC

## 2017-07-14 NOTE — Patient Instructions (Signed)
Take Lantus 10 units at 8pm tonight then start taking 20 units at 8pm every night instead of the morning dose.

## 2017-07-17 ENCOUNTER — Ambulatory Visit: Payer: Medicare Other | Admitting: Internal Medicine

## 2017-07-17 ENCOUNTER — Encounter: Payer: Self-pay | Admitting: Internal Medicine

## 2017-07-17 VITALS — BP 148/64 | HR 64 | Ht 73.0 in | Wt 219.2 lb

## 2017-07-17 DIAGNOSIS — I10 Essential (primary) hypertension: Secondary | ICD-10-CM

## 2017-07-17 DIAGNOSIS — I5042 Chronic combined systolic (congestive) and diastolic (congestive) heart failure: Secondary | ICD-10-CM

## 2017-07-17 DIAGNOSIS — I5022 Chronic systolic (congestive) heart failure: Secondary | ICD-10-CM | POA: Diagnosis not present

## 2017-07-17 DIAGNOSIS — Z951 Presence of aortocoronary bypass graft: Secondary | ICD-10-CM

## 2017-07-17 DIAGNOSIS — N185 Chronic kidney disease, stage 5: Secondary | ICD-10-CM | POA: Insufficient documentation

## 2017-07-17 DIAGNOSIS — I110 Hypertensive heart disease with heart failure: Secondary | ICD-10-CM

## 2017-07-17 NOTE — Patient Instructions (Addendum)
Medication Instructions:   STOP aspirin 500mg  DECREASE aspirin to 81mg  daily OK to take extra strength tylenol as needed  Labwork:  NONE  Testing/Procedures:  NONE  Follow-Up:  Your physician wants you to follow-up in: 6 months with Dr. Debara Pickett. You will receive a reminder letter in the mail two months in advance. If you don't receive a letter, please call our office to schedule the follow-up appointment.   If you need a refill on your cardiac medications before your next appointment, please call your pharmacy.  Any Other Special Instructions Will Be Listed Below (If Applicable).

## 2017-07-17 NOTE — Progress Notes (Signed)
OFFICE NOTE  Chief Complaint:  Routine follow-up  Primary Care Physician: Claretta Fraise, MD  HPI:  [name] is a 72 y.o. male with a past medial history significant for hypertension, IDDM, BPH, prior herniated disk in 2005 sp L3/L4 diskectomy and fusion, mild bilateral ICA stenosis in 2014 and EF 60-65% with moderate LVH by echo in 2014, there was focal distal anterior and anteroseptal thinning and akinesis, diastolic dysfunction and mild to moderate pulmonary hypertension at the time. He also has CKD with creatinine of 2.65 in 2015 (now 3.83) -sees Dr. Hinda Lenis in [place]. He now presented today to [facility] with complaints of chest pain since last Saturday (started in the abdomen and worked up to the chest) and progressive dyspnea, ultimately brought in by EMS. He has required BIPAP and was weaned off prior to transport, but then restarted when he arrived here for high O2 requirement. Labs significant for Creatinine of 3.83 (GFR 19), Troponin T of 0.26, BNP of 19,808, anemia with H/H 9.1/27.5, glucose of 261, no leukocytosis. CXR shows mild cardiomegaly, CHF/consolidation - suspect pneumonia with superimposed pulmonary edema/ARDS, EKG shows NSR at 76 (personally reviewed) with anterior and lateral ST depression.  He ultimately underwent heart catheterization found to have multivessel coronary disease and then subsequently had three-vessel bypass with LIMA to LAD, SVG to diagonal and SVG to RCA on 08/05/16.  Since then, he was seen in follow-up by Dr. Oval Linsey and noted to be euvolemic on Lasix.  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  Clonidine 0.1 mg 3 times daily was added.  Subsequently he has had a number of hypertension clinic follow-up appointments and blood pressure remains elevated.  Overall he feels well, though.  12/15/2016  [name] has no new complaints today.  Blood pressure remains elevated.  He denies any worsening shortness of  breath, weight gain or lower extremity edema.  Fortunately he is not on dialysis.  01/30/2017  [name] returns today for follow-up.  He says his blood pressures appeared somewhat better with 3 times daily clonidine.  He occasionally misses his night dose and was not taking it before going to bed which is usually around 8:00.  He says he wakes up at about 11:45 to go to the bathroom and then sometimes remembers to take it then, but has trouble falling back asleep.  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  With regards to this he is asymptomatic and denies any abdominal pain.  07/17/2017  [name] returns today for follow-up.  He denies any worsening chest pain or shortness of breath.  Blood pressure had been elevated however recently he has been on additional medication including hydralazine 100 mg 4 times daily.  He is ready on clonidine 0.2 mg 3 times daily, along with amlodipine and carvedilol.  Blood pressure is better controlled on this regimen.  Unfortunately he has chronic kidney disease.  At some point recently he was counseled on having a fistula placed however he has declined.  Weight is down about 6 pounds since I last saw him.  He also reports taking very high-dose aspirin for arthritis.  In fact he takes 500 mg every 6 hours.  He was not aware that this could adversely affect his kidney function.  PMHx:  Past Medical History:  Diagnosis Date  . CAD (coronary artery disease), native coronary artery  s/p NSTEMI and CABG x 3 (LIMA to LAD, SVG to diagonal, SVG to dRCA) on 08/05/16  . Carotid artery occlusion   . Chronic kidney disease   . Diabetes mellitus without complication ([place])   . Hypertension     Past Surgical History:  Procedure Laterality Date  . APPENDECTOMY    . CORONARY ARTERY BYPASS GRAFT N/A 08/05/2016   Procedure: CORONARY ARTERY BYPASS GRAFTING (CABG) x 3, LIMA to LAD, SVG  to DIAGONAL, SVG to OM1, SVG to DISTAL RCA, USING LEFT MAMMARY ARTERY AND RIGHT GREATER SAPHENOUS VEIN HARVESTED ENDOSCOPICALLY;  Surgeon: [name], MD;  Location: [place];  Service: Open Heart Surgery;  Laterality: N/A;  . JOINT REPLACEMENT Bilateral    hip replacements  . LEFT HEART CATH AND CORONARY ANGIOGRAPHY N/A 07/28/2016   Procedure: Left Heart Cath and Coronary Angiography;  Surgeon: Jettie Booze, MD;  Location: [place] [facility];  Service: Cardiovascular;  Laterality: N/A;  . LUMBAR FUSION  2005  . TEE WITHOUT CARDIOVERSION N/A 08/05/2016   Procedure: TRANSESOPHAGEAL ECHOCARDIOGRAM (TEE);  Surgeon: [name], MD;  Location: [place];  Service: Open Heart Surgery;  Laterality: N/A;  . TONSILECTOMY, ADENOIDECTOMY, BILATERAL MYRINGOTOMY AND TUBES      FAMHx:  Family History  Problem Relation Age of Onset  . Heart disease Mother   . Hypertension Mother   . Arthritis Mother   . Cancer Mother   . Diabetes Mother   . Hyperlipidemia Mother   . Kidney disease Mother   . Hypertension Father   . Arthritis Father   . Cancer Father   . Diabetes Father   . Hyperlipidemia Father   . Hyperlipidemia Sister   . Hypertension Sister   . Vision loss Sister   . Hyperlipidemia Brother   . Hypertension Brother   . Arthritis Maternal Grandmother   . Arthritis Maternal Grandfather   . Arthritis Paternal Grandmother   . Arthritis Paternal Grandfather   . Asthma Sister   . Hyperlipidemia Sister   . Hypertension Sister   . Cancer Brother   . Diabetes Brother   . Hyperlipidemia Brother   . Hypertension Brother   . Cancer Brother   . Hyperlipidemia Brother   . Hypertension Brother     SOCHx:   reports that he has quit smoking. He has never used smokeless tobacco. He reports that he does not drink alcohol or use drugs.  ALLERGIES:  Allergies  Allergen Reactions  . Sulfa Antibiotics Hives    ROS: Pertinent items noted in HPI and remainder of comprehensive ROS  otherwise negative.  HOME MEDS: Current Outpatient Medications on File Prior to Visit  Medication Sig Dispense Refill  . acetaminophen (TYLENOL) 325 MG tablet Take 2 tablets (650 mg total) by mouth every 6 (six) hours as needed for mild pain.    Marland Kitchen allopurinol (ZYLOPRIM) 100 MG tablet Take 1 tablet (100 mg total) by mouth daily. 90 tablet 1  . alprostadil (EDEX) 40 MCG injection 40 mcg by Intracavitary route as needed for erectile dysfunction. use no more than 3 times per week 1 each 12  . AMITIZA 24 MCG capsule Take 1 capsule (24 mcg total) by mouth 2 (two) times daily. 180 capsule 1  . amLODipine (NORVASC) 10 MG tablet Take 1 tablet (10 mg total) by mouth daily. 90 tablet 1  . aspirin 500 MG tablet Take 500 mg by mouth every 6 (six) hours as needed for pain.    Marland Kitchen betamethasone valerate (VALISONE) 0.1 %  cream Apply 1 application topically daily.   99  . carvedilol (COREG) 25 MG tablet TAKE 1 & 1/2 TABLET BY MOUTH TWICE A DAY 270 tablet 3  . cloNIDine (CATAPRES) 0.1 MG tablet TAKE 2 TABLETS BY MOUTH THREE TIMES A DAY 180 tablet 3  . CVS VITAMIN D 2000 units CAPS Take 1 capsule by mouth daily.  5  . doxazosin (CARDURA) 4 MG tablet Take 1 tablet (4 mg total) by mouth 2 (two) times daily. 180 tablet 1  . ferrous sulfate 325 (65 FE) MG tablet Take 1 tablet (325 mg total) by mouth daily with breakfast.  3  . finasteride (PROSCAR) 5 MG tablet Take 5 mg by mouth daily.  3  . fluticasone (FLONASE) 50 MCG/ACT nasal spray USE 2 SPRAYS IN EACH NOSTRIL AT BEDTIME  9  . furosemide (LASIX) 40 MG tablet Take 1 tablet (40 mg total) by mouth daily.    Marland Kitchen gabapentin (NEURONTIN) 100 MG capsule Take 100 mg by mouth 3 (three) times daily.  3  . hydrALAZINE (APRESOLINE) 100 MG tablet Take 1 tablet (100 mg total) by mouth 4 (four) times daily -  before meals and at bedtime. 360 tablet 3  . Insulin Glargine (LANTUS SOLOSTAR) 100 UNIT/ML Solostar Pen Inject 20 Units into the skin daily at 10 pm. 5 pen PRN  . isosorbide  dinitrate (ISORDIL) 20 MG tablet Take 2 tablets (40 mg total) by mouth 3 (three) times daily. 180 tablet 5  . levothyroxine (SYNTHROID, LEVOTHROID) 100 MCG tablet Take 1 tablet (100 mcg total) by mouth daily. 90 tablet 1  . rosuvastatin (CRESTOR) 20 MG tablet Take 1 tablet (20 mg total) by mouth every evening. 90 tablet 1  . sodium bicarbonate 650 MG tablet Take 1 tablet (650 mg total) by mouth 2 (two) times daily. 180 tablet 1   No current facility-administered medications on file prior to visit.     LABS/IMAGING: No results found for this or any previous visit (from the past 48 hour(s)). No results found.  LIPID PANEL:    Component Value Date/Time   CHOL 99 07/26/2016 0022   TRIG 80 07/26/2016 0022   HDL 38 (L) 07/26/2016 0022   CHOLHDL 2.6 07/26/2016 0022   VLDL 16 07/26/2016 0022   LDLCALC 45 07/26/2016 0022     WEIGHTS: Wt Readings from Last 3 Encounters:  07/17/17 219 lb 3.2 oz (99.4 kg)  07/14/17 224 lb 2 oz (101.7 kg)  06/09/17 221 lb 2 oz (100.3 kg)    VITALS: BP (!) 148/64   Pulse 64   Ht 6\' 1"  (1.854 m)   Wt 219 lb 3.2 oz (99.4 kg)   BMI 28.92 kg/m   EXAM: General appearance: alert and no distress Neck: no carotid bruit, no JVD and thyroid not enlarged, symmetric, no tenderness/mass/nodules Lungs: clear to auscultation bilaterally Heart: regular rate and rhythm, S1, S2 normal, no murmur, click, rub or gallop Abdomen: soft, non-tender; bowel sounds normal; no masses,  no organomegaly Extremities: extremities normal, atraumatic, no cyanosis or edema Pulses: 2+ and symmetric Skin: Skin color, texture, turgor normal. No rashes or lesions Neurologic: Grossly normal Psych: Pleasant  EKG: Sinus rhythm with first-degree AV block at 64, inferolateral T wave changes-stable, personally reviewed  ASSESSMENT: 1. CAD status post three-vessel CABG (07/2016) 2. Acute on chronic systolic and diastolic congestive heart failure 3. Hypertensive heart  disease 4. Dyslipidemia 5. PAD 6. CKD 5  PLAN: 1.   [name] seems to have had improvement in  his blood pressure now on multiple medications.  With regard to chronic kidney disease, I advised him to see if he could come off of aspirin.  He has been taking very high doses which can negatively affect his renal function.  He should consider Tylenol for pain management as an alternative.  If this is not strong enough for him he may need to seek out a different type of pain medication that is not as likely to affect his chronic kidney disease.  I would agree with fistula placement as it is very likely he will progress to dialysis unfortunately.  From a cardiac standpoint he appears to be stable.  No other changes to his medications today.  Follow-up with me annually or sooner as necessary.  Pixie Casino, MD, [facility], [place] Director of the Advanced Lipid Disorders &  Cardiovascular Risk Reduction Clinic Diplomate of the American Board of Clinical Lipidology Attending Cardiologist  Direct Dial: [phone]  Fax: [phone]  Website:  [email or web address] 07/17/2017, 10:40 AM

## 2017-07-20 DIAGNOSIS — Z79899 Other long term (current) drug therapy: Secondary | ICD-10-CM | POA: Diagnosis not present

## 2017-07-20 DIAGNOSIS — E559 Vitamin D deficiency, unspecified: Secondary | ICD-10-CM | POA: Diagnosis not present

## 2017-07-20 DIAGNOSIS — D509 Iron deficiency anemia, unspecified: Secondary | ICD-10-CM | POA: Diagnosis not present

## 2017-07-20 DIAGNOSIS — R809 Proteinuria, unspecified: Secondary | ICD-10-CM | POA: Diagnosis not present

## 2017-07-20 DIAGNOSIS — N184 Chronic kidney disease, stage 4 (severe): Secondary | ICD-10-CM | POA: Diagnosis not present

## 2017-07-20 DIAGNOSIS — D631 Anemia in chronic kidney disease: Secondary | ICD-10-CM | POA: Diagnosis not present

## 2017-07-23 ENCOUNTER — Encounter: Payer: Self-pay | Admitting: Family Medicine

## 2017-07-23 NOTE — Progress Notes (Signed)
Subjective:  Patient ID: [name], male    DOB: 22-Aug-1945  Age: 72 y.o. MRN: [id number]  CC: Diabetes (pt here today for 1 month follow up after changing his lantus)   HPI THALES KNIPPLE presents forFollow-up of diabetes. Patient checks blood sugar at home.  Log attached. Reviewed. No clear trend. Some lows into the 60s.  Patient denies symptoms such as polyuria, polydipsia, excessive hunger, nausea Medications reviewed. Pt reports taking them regularly without complication/adverse reaction being reported today.    History Doyel has a past medical history of CAD (coronary artery disease), native coronary artery, Carotid artery occlusion, Chronic kidney disease, Diabetes mellitus without complication ([place]), and Hypertension.   He has a past surgical history that includes Lumbar fusion (2005); LEFT HEART CATH AND CORONARY ANGIOGRAPHY (N/A, 07/28/2016); Coronary artery bypass graft (N/A, 08/05/2016); TEE without cardioversion (N/A, 08/05/2016); Joint replacement (Bilateral); Appendectomy; and Tonsilectomy, adenoidectomy, bilateral myringotomy and tubes.   His family history includes Arthritis in his father, maternal grandfather, maternal grandmother, mother, paternal grandfather, and paternal grandmother; Asthma in his sister; Cancer in his brother, brother, father, and mother; Diabetes in his brother, father, and mother; Heart disease in his mother; Hyperlipidemia in his brother, brother, brother, father, mother, sister, and sister; Hypertension in his brother, brother, brother, father, mother, sister, and sister; Kidney disease in his mother; Vision loss in his sister.He reports that he has quit smoking. He has never used smokeless tobacco. He reports that he does not drink alcohol or use drugs.  Current Outpatient Medications on File Prior to Visit  Medication Sig Dispense Refill  . acetaminophen (TYLENOL) 325 MG tablet Take 2 tablets (650 mg total) by mouth every 6 (six) hours as needed  for mild pain.    Marland Kitchen allopurinol (ZYLOPRIM) 100 MG tablet Take 1 tablet (100 mg total) by mouth daily. 90 tablet 1  . alprostadil (EDEX) 40 MCG injection 40 mcg by Intracavitary route as needed for erectile dysfunction. use no more than 3 times per week 1 each 12  . AMITIZA 24 MCG capsule Take 1 capsule (24 mcg total) by mouth 2 (two) times daily. 180 capsule 1  . amLODipine (NORVASC) 10 MG tablet Take 1 tablet (10 mg total) by mouth daily. 90 tablet 1  . betamethasone valerate (VALISONE) 0.1 % cream Apply 1 application topically daily.   99  . carvedilol (COREG) 25 MG tablet TAKE 1 & 1/2 TABLET BY MOUTH TWICE A DAY 270 tablet 3  . cloNIDine (CATAPRES) 0.1 MG tablet TAKE 2 TABLETS BY MOUTH THREE TIMES A DAY 180 tablet 3  . doxazosin (CARDURA) 4 MG tablet Take 1 tablet (4 mg total) by mouth 2 (two) times daily. 180 tablet 1  . ferrous sulfate 325 (65 FE) MG tablet Take 1 tablet (325 mg total) by mouth daily with breakfast.  3  . finasteride (PROSCAR) 5 MG tablet Take 5 mg by mouth daily.  3  . fluticasone (FLONASE) 50 MCG/ACT nasal spray USE 2 SPRAYS IN EACH NOSTRIL AT BEDTIME  9  . furosemide (LASIX) 40 MG tablet Take 1 tablet (40 mg total) by mouth daily.    Marland Kitchen gabapentin (NEURONTIN) 100 MG capsule Take 100 mg by mouth 3 (three) times daily.  3  . Insulin Glargine (LANTUS SOLOSTAR) 100 UNIT/ML Solostar Pen Inject 20 Units into the skin daily at 10 pm. 5 pen PRN  . isosorbide dinitrate (ISORDIL) 20 MG tablet Take 2 tablets (40 mg total) by mouth 3 (three) times daily. [place]  tablet 5  . levothyroxine (SYNTHROID, LEVOTHROID) 100 MCG tablet Take 1 tablet (100 mcg total) by mouth daily. 90 tablet 1  . rosuvastatin (CRESTOR) 20 MG tablet Take 1 tablet (20 mg total) by mouth every evening. 90 tablet 1  . sodium bicarbonate 650 MG tablet Take 1 tablet (650 mg total) by mouth 2 (two) times daily. 180 tablet 1  . CVS VITAMIN D 2000 units CAPS Take 1 capsule by mouth daily.  5   No current  facility-administered medications on file prior to visit.     ROS Review of Systems  Objective:  BP (!) 165/56   Pulse 63   Temp (!) 97.3 F (36.3 C) (Oral)   Ht 6\' 1"  (1.854 m)   Wt 224 lb 2 oz (101.7 kg)   BMI 29.57 kg/m   BP Readings from Last 3 Encounters:  07/17/17 (!) 148/64  07/14/17 (!) 165/56  06/09/17 (!) 160/54    Wt Readings from Last 3 Encounters:  07/17/17 219 lb 3.2 oz (99.4 kg)  07/14/17 224 lb 2 oz (101.7 kg)  06/09/17 221 lb 2 oz (100.3 kg)     Physical Exam    Assessment & Plan:   Alexandre was seen today for diabetes.  Diagnoses and all orders for this visit:  Diabetes mellitus type 2 in nonobese [facility])  Hypertensive heart disease with chronic combined systolic and diastolic congestive heart failure ([place])  Other orders -     hydrALAZINE (APRESOLINE) 100 MG tablet; Take 1 tablet (100 mg total) by mouth 4 (four) times daily -  before meals and at bedtime.      I have discontinued Ritvik L. Thom's amoxicillin. I have also changed his hydrALAZINE. Additionally, I am having him maintain his betamethasone valerate, CVS VITAMIN D, fluticasone, acetaminophen, ferrous sulfate, furosemide, isosorbide dinitrate, carvedilol, finasteride, gabapentin, allopurinol, AMITIZA, amLODipine, doxazosin, levothyroxine, rosuvastatin, sodium bicarbonate, alprostadil, Insulin Glargine, and cloNIDine.  Meds ordered this encounter  Medications  . hydrALAZINE (APRESOLINE) 100 MG tablet    Sig: Take 1 tablet (100 mg total) by mouth 4 (four) times daily -  before meals and at bedtime.    Dispense:  360 tablet    Refill:  3     Follow-up: Return in about 3 months (around 10/14/2017).  Claretta Fraise, M.D.

## 2017-08-03 DIAGNOSIS — D631 Anemia in chronic kidney disease: Secondary | ICD-10-CM | POA: Diagnosis not present

## 2017-08-03 DIAGNOSIS — E559 Vitamin D deficiency, unspecified: Secondary | ICD-10-CM | POA: Diagnosis not present

## 2017-08-03 DIAGNOSIS — Z79899 Other long term (current) drug therapy: Secondary | ICD-10-CM | POA: Diagnosis not present

## 2017-08-03 DIAGNOSIS — R809 Proteinuria, unspecified: Secondary | ICD-10-CM | POA: Diagnosis not present

## 2017-08-03 DIAGNOSIS — N184 Chronic kidney disease, stage 4 (severe): Secondary | ICD-10-CM | POA: Diagnosis not present

## 2017-08-03 DIAGNOSIS — D509 Iron deficiency anemia, unspecified: Secondary | ICD-10-CM | POA: Diagnosis not present

## 2017-08-08 ENCOUNTER — Other Ambulatory Visit: Payer: Self-pay | Admitting: Family Medicine

## 2017-08-15 ENCOUNTER — Encounter: Payer: Self-pay | Admitting: Family Medicine

## 2017-08-15 ENCOUNTER — Ambulatory Visit (INDEPENDENT_AMBULATORY_CARE_PROVIDER_SITE_OTHER): Payer: Medicare Other | Admitting: Family Medicine

## 2017-08-15 VITALS — BP 169/56 | HR 67 | Temp 97.3°F | Ht 73.0 in | Wt 224.2 lb

## 2017-08-15 DIAGNOSIS — E11649 Type 2 diabetes mellitus with hypoglycemia without coma: Secondary | ICD-10-CM | POA: Diagnosis not present

## 2017-08-15 DIAGNOSIS — I10 Essential (primary) hypertension: Secondary | ICD-10-CM

## 2017-08-15 DIAGNOSIS — N184 Chronic kidney disease, stage 4 (severe): Secondary | ICD-10-CM

## 2017-08-15 NOTE — Progress Notes (Signed)
Subjective:  Patient ID: [name]., male    DOB: 1945/07/08  Age: 72 y.o. MRN: [id number]  CC: Diabetes (pt here today for 1 month follow up after changing his Lantus and states he has been taking 15 units every monring instead of 20 units in the evening)   HPI [name]. presents forFollow-up of diabetes. Patient checks blood sugar at home.  Noted on log that there has been significant improvement, but pt. Moved the lantus to AM and reduced the dose to 15 units because of two episodes of hypoglycemia. Symptoms were lightheadedness, nausea. Patient denies symptoms such as polyuria, polydipsia, excessive hunger. History Treasure has a past medical history of CAD (coronary artery disease), native coronary artery, Carotid artery occlusion, Chronic kidney disease, Diabetes mellitus without complication ([place]), and Hypertension.   He has a past surgical history that includes Lumbar fusion (2005); LEFT HEART CATH AND CORONARY ANGIOGRAPHY (N/A, 07/28/2016); Coronary artery bypass graft (N/A, 08/05/2016); TEE without cardioversion (N/A, 08/05/2016); Joint replacement (Bilateral); Appendectomy; and Tonsilectomy, adenoidectomy, bilateral myringotomy and tubes.   His family history includes Arthritis in his father, maternal grandfather, maternal grandmother, mother, paternal grandfather, and paternal grandmother; Asthma in his sister; Cancer in his brother, brother, father, and mother; Diabetes in his brother, father, and mother; Heart disease in his mother; Hyperlipidemia in his brother, brother, brother, father, mother, sister, and sister; Hypertension in his brother, brother, brother, father, mother, sister, and sister; Kidney disease in his mother; Vision loss in his sister.He reports that he has quit smoking. He has never used smokeless tobacco. He reports that he does not drink alcohol or use drugs.  Current Outpatient Medications on File Prior to Visit  Medication Sig Dispense Refill  .  acetaminophen (TYLENOL) 325 MG tablet Take 2 tablets (650 mg total) by mouth every 6 (six) hours as needed for mild pain.    Marland Kitchen allopurinol (ZYLOPRIM) 100 MG tablet Take 1 tablet (100 mg total) by mouth daily. 90 tablet 1  . alprostadil (EDEX) 40 MCG injection 40 mcg by Intracavitary route as needed for erectile dysfunction. use no more than 3 times per week 1 each 12  . AMITIZA 24 MCG capsule Take 1 capsule (24 mcg total) by mouth 2 (two) times daily. 180 capsule 1  . amLODipine (NORVASC) 10 MG tablet Take 1 tablet (10 mg total) by mouth daily. 90 tablet 1  . aspirin 81 MG EC tablet Take 81 mg by mouth daily. Swallow whole.    . betamethasone valerate (VALISONE) 0.1 % cream Apply 1 application topically daily.   99  . carvedilol (COREG) 25 MG tablet TAKE 1 & 1/2 TABLET BY MOUTH TWICE A DAY 270 tablet 3  . cloNIDine (CATAPRES) 0.1 MG tablet TAKE 2 TABLETS BY MOUTH THREE TIMES A DAY 180 tablet 3  . doxazosin (CARDURA) 4 MG tablet Take 1 tablet (4 mg total) by mouth 2 (two) times daily. 180 tablet 1  . ferrous sulfate 325 (65 FE) MG tablet Take 1 tablet (325 mg total) by mouth daily with breakfast.  3  . finasteride (PROSCAR) 5 MG tablet Take 5 mg by mouth daily.  3  . fluticasone (FLONASE) 50 MCG/ACT nasal spray USE 2 SPRAYS IN EACH NOSTRIL AT BEDTIME  9  . furosemide (LASIX) 40 MG tablet Take 1 tablet (40 mg total) by mouth daily.    Marland Kitchen gabapentin (NEURONTIN) 100 MG capsule Take 100 mg by mouth 3 (three) times daily.  3  . hydrALAZINE (APRESOLINE)  100 MG tablet Take 1 tablet (100 mg total) by mouth 4 (four) times daily -  before meals and at bedtime. 360 tablet 3  . Insulin Glargine (LANTUS SOLOSTAR) 100 UNIT/ML Solostar Pen Inject 20 Units into the skin daily at 10 pm. 5 pen PRN  . isosorbide dinitrate (ISORDIL) 20 MG tablet Take 2 tablets (40 mg total) by mouth 3 (three) times daily. 180 tablet 5  . levothyroxine (SYNTHROID, LEVOTHROID) 100 MCG tablet TAKE 1 TABLET BY MOUTH EVERY DAY 90 tablet 1    . rosuvastatin (CRESTOR) 20 MG tablet Take 1 tablet (20 mg total) by mouth every evening. 90 tablet 1  . sodium bicarbonate 650 MG tablet Take 1 tablet (650 mg total) by mouth 2 (two) times daily. (Patient not taking: Reported on 08/15/2017) 180 tablet 1  . tamsulosin (FLOMAX) 0.4 MG CAPS capsule Take 0.8 mg by mouth daily.  11   No current facility-administered medications on file prior to visit.     ROS Review of Systems  Constitutional: Negative for fever.  Respiratory: Negative for shortness of breath.   Cardiovascular: Negative for chest pain.  Musculoskeletal: Negative for arthralgias.  Skin: Negative for rash.    Objective:  BP (!) 169/56   Pulse 67   Temp (!) 97.3 F (36.3 C) (Oral)   Ht 6\' 1"  (1.854 m)   Wt 224 lb 4 oz (101.7 kg)   BMI 29.59 kg/m   BP Readings from Last 3 Encounters:  08/15/17 (!) 169/56  07/17/17 (!) 148/64  07/14/17 (!) 165/56    Wt Readings from Last 3 Encounters:  08/15/17 224 lb 4 oz (101.7 kg)  07/17/17 219 lb 3.2 oz (99.4 kg)  07/14/17 224 lb 2 oz (101.7 kg)     Physical Exam  Constitutional: He appears well-developed and well-nourished.  HENT:  Head: Normocephalic and atraumatic.  Right Ear: Tympanic membrane and external ear normal. No decreased hearing is noted.  Left Ear: Tympanic membrane and external ear normal. No decreased hearing is noted.  Mouth/Throat: No oropharyngeal exudate or posterior oropharyngeal erythema.  Eyes: Pupils are equal, round, and reactive to light.  Neck: Normal range of motion. Neck supple.  Cardiovascular: Normal rate and regular rhythm.  No murmur heard. Pulmonary/Chest: Breath sounds normal. No respiratory distress.  Abdominal: Soft. There is no tenderness.  Vitals reviewed.     Assessment & Plan:   Waylin was seen today for diabetes.  Diagnoses and all orders for this visit:  Uncontrolled type 2 diabetes mellitus with hypoglycemia without coma ([place])  Resistant hypertension  CKD  (chronic kidney disease) stage 4, GFR 15-29 ml/min ([facility])   Difficult to control BP related to renal faillure.   I have discontinued Karron L. Dorval Jr.'s CVS VITAMIN D. I am also having him maintain his betamethasone valerate, fluticasone, acetaminophen, ferrous sulfate, furosemide, isosorbide dinitrate, carvedilol, finasteride, gabapentin, allopurinol, AMITIZA, amLODipine, doxazosin, rosuvastatin, sodium bicarbonate, alprostadil, Insulin Glargine, cloNIDine, hydrALAZINE, aspirin, levothyroxine, and tamsulosin.  No orders of the defined types were placed in this encounter.    Follow-up: Return in about 1 month (around 09/15/2017), or if symptoms worsen or fail to improve.  Claretta Fraise, M.D.

## 2017-08-16 ENCOUNTER — Encounter: Payer: Self-pay | Admitting: Family Medicine

## 2017-08-18 DIAGNOSIS — R809 Proteinuria, unspecified: Secondary | ICD-10-CM | POA: Diagnosis not present

## 2017-08-18 DIAGNOSIS — D509 Iron deficiency anemia, unspecified: Secondary | ICD-10-CM | POA: Diagnosis not present

## 2017-08-18 DIAGNOSIS — D631 Anemia in chronic kidney disease: Secondary | ICD-10-CM | POA: Diagnosis not present

## 2017-08-18 DIAGNOSIS — E559 Vitamin D deficiency, unspecified: Secondary | ICD-10-CM | POA: Diagnosis not present

## 2017-08-18 DIAGNOSIS — Z79899 Other long term (current) drug therapy: Secondary | ICD-10-CM | POA: Diagnosis not present

## 2017-08-18 DIAGNOSIS — N184 Chronic kidney disease, stage 4 (severe): Secondary | ICD-10-CM | POA: Diagnosis not present

## 2017-08-23 ENCOUNTER — Encounter (INDEPENDENT_AMBULATORY_CARE_PROVIDER_SITE_OTHER): Payer: Self-pay | Admitting: Orthopaedic Surgery

## 2017-08-23 ENCOUNTER — Ambulatory Visit (INDEPENDENT_AMBULATORY_CARE_PROVIDER_SITE_OTHER): Payer: Medicare Other | Admitting: Orthopaedic Surgery

## 2017-08-23 ENCOUNTER — Ambulatory Visit (INDEPENDENT_AMBULATORY_CARE_PROVIDER_SITE_OTHER): Payer: Self-pay

## 2017-08-23 VITALS — BP 173/77 | HR 70 | Ht 73.0 in | Wt 225.0 lb

## 2017-08-23 DIAGNOSIS — M25551 Pain in right hip: Secondary | ICD-10-CM

## 2017-08-23 NOTE — Progress Notes (Signed)
Office Visit Note   Patient: [name].           Date of Birth: 10/15/1945           MRN: [id number] Visit Date: 08/23/2017              Requested by: Claretta Fraise, MD [address], [place] [id number] PCP: Claretta Fraise, MD   Assessment & Plan: Visit Diagnoses:  1. Pain of right hip joint     Plan: Polyethylene wear right total hip replacement.  Long discussion regarding definitive treatment of revision arthroplasty of the right total hip replacement.  This would involve insertion of a new polyethylene component and femoral head.  The pain would like to "think about it".  He did have an MI year ago with CABG procedure and will need cardiac clearance if he decides to proceed with surgery.  Also think it be worthwhile to consider a three-phase bone scan of his right hip before proceeding with surgery should he so decide.  Would also obtain CBC, sed rate and C-reactive protein.  Discussed all of the above with [name]  Follow-Up Instructions: Return if symptoms worsen or fail to improve.   Orders:  Orders Placed This Encounter  Procedures  . XR Pelvis 1-2 Views   No orders of the defined types were placed in this encounter.     Procedures: No procedures performed   Clinical Data: No additional findings.   Subjective: Chief Complaint  Patient presents with  . Follow-up    R HIP PAIN GETTING WORSE OVER LAST YR. PT HAD SURGERY 1999  [name] is presently 72 years old and an old patient.  In 1999 he had a right total hip replacement and has done well up until about a year ago when he began to experience insidious onset of right hip pain.  He presently uses a cane and finds it difficult to get in and out of bed and even a car related to his hip pain.  Pain is localized in the area of his groin.  He is had a prior CABG with vein harvesting from his right lower extremity and is does have some numbness as a result of his surgery in his right foot.  Also has had some  chronic edema in the past year after the vein harvest.  No history of DVT.  Denies any history of injury or trauma.  He has not had any sensation of instability  HPI  Review of Systems  Constitutional: Negative for fatigue and fever.  HENT: Negative for ear pain.   Eyes: Negative for pain.  Respiratory: Negative for cough and shortness of breath.   Cardiovascular: Positive for leg swelling.  Gastrointestinal: Positive for constipation. Negative for diarrhea.  Genitourinary: Positive for difficulty urinating.  Musculoskeletal: Negative for back pain and neck pain.  Skin: Negative for rash.  Allergic/Immunologic: Negative for food allergies.  Neurological: Positive for weakness and numbness.  Hematological: Bruises/bleeds easily.  Psychiatric/Behavioral: Negative for sleep disturbance.     Objective: Vital Signs: BP (!) 173/77 (BP Location: Right Arm, Patient Position: Sitting, Cuff Size: Normal)   Pulse 70   Ht 6\' 1"  (1.854 m)   Wt 225 lb (102.1 kg)   BMI 29.69 kg/m   Physical Exam  Constitutional: He is oriented to person, place, and time. He appears well-developed and well-nourished.  HENT:  Mouth/Throat: Oropharynx is clear and moist.  Eyes: Pupils are equal, round, and reactive to light.  EOM are normal.  Pulmonary/Chest: Effort normal.  Neurological: He is alert and oriented to person, place, and time.  Skin: Skin is warm and dry.  Psychiatric: He has a normal mood and affect. His behavior is normal.    Ortho Exam awake alert and oriented x3.  Comfortable sitting.  Does have a painful gait referable to his right hip.  Leg lengths appear to be symmetrical.  Nonpitting edema from the mid right tibia distally.  Some subjective tingling in the right foot.  No pain with range of motion of his right knee.  Has pain in the right groin with internal and external rotation but without apprehension.  No localized areas of tenderness about the hip.  Grades negative.  Films demonstrate  significant polyethylene wear of the right total hip replacement  Specialty Comments:  No specialty comments available.  Imaging: Xr Pelvis 1-2 Views  Result Date: 08/23/2017 The pelvis and lateral right hip were obtained demonstrating significant polyethylene wear of a right total hip replacement.  There is superior migration of the 28 mm femoral head.  No evidence of loosening of the acetabulum.  Diffuse calcification within the femoral artery.  Left total hip replacement appears to be in good position without obvious wear.    PMFS History: Patient Active Problem List   Diagnosis Date Noted  . CKD (chronic kidney disease) stage 5, GFR less than 15 ml/min (HCC) 07/17/2017  . Chronic systolic heart failure ([place]) 01/30/2017  . Hypertensive heart disease with chronic combined systolic and diastolic congestive heart failure ([place])   . Hx of CABG   . S/P CABG (coronary artery bypass graft)   . Diabetes mellitus type 2 in nonobese (HCC)   . Benign essential HTN   . Acute blood loss anemia   . Acute pulmonary edema (HCC)   . Hypoxemia   . Acute respiratory failure with hypoxia ([place])   . Acute on chronic systolic and diastolic heart failure, NYHA class 2 ([place]) 07/25/2016  . Essential hypertension 07/25/2016  . 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 AKI (acute kidney injury) ([place]) 07/25/2016  . CKD (chronic kidney disease) stage 4, GFR 15-29 ml/min (HCC) 07/25/2016  . Elevated troponin 07/25/2016  . Hypothyroidism 07/25/2016  . Dyslipidemia 07/25/2016  . Diabetes mellitus, insulin-dependent (IDDM or type I) ([place]) 07/25/2016  . NSTEMI (non-ST elevated myocardial infarction) ([place]) 07/25/2016   Past Medical History:  Diagnosis Date  . CAD (coronary artery disease), native coronary artery    s/p NSTEMI and CABG x 3 (LIMA to LAD, SVG to diagonal, SVG to dRCA) on 08/05/16  . Carotid artery occlusion   . Chronic kidney disease   . Diabetes mellitus without  complication ([place])   . Hypertension     Family History  Problem Relation Age of Onset  . Heart disease Mother   . Hypertension Mother   . Arthritis Mother   . Cancer Mother   . Diabetes Mother   . Hyperlipidemia Mother   . Kidney disease Mother   . Hypertension Father   . Arthritis Father   . Cancer Father   . Diabetes Father   . Hyperlipidemia Father   . Hyperlipidemia Sister   . Hypertension Sister   . Vision loss Sister   . Hyperlipidemia Brother   . Hypertension Brother   . Arthritis Maternal Grandmother   . Arthritis Maternal Grandfather   . Arthritis Paternal Grandmother   . Arthritis Paternal Grandfather   . Asthma Sister   .  Hyperlipidemia Sister   . Hypertension Sister   . Cancer Brother   . Diabetes Brother   . Hyperlipidemia Brother   . Hypertension Brother   . Cancer Brother   . Hyperlipidemia Brother   . Hypertension Brother     Past Surgical History:  Procedure Laterality Date  . APPENDECTOMY    . CORONARY ARTERY BYPASS GRAFT N/A 08/05/2016   Procedure: CORONARY ARTERY BYPASS GRAFTING (CABG) x 3, LIMA to LAD, SVG to DIAGONAL, SVG to OM1, SVG to DISTAL RCA, USING LEFT MAMMARY ARTERY AND RIGHT GREATER SAPHENOUS VEIN HARVESTED ENDOSCOPICALLY;  Surgeon: [name], MD;  Location: [facility];  Service: Open Heart Surgery;  Laterality: N/A;  . JOINT REPLACEMENT Bilateral    hip replacements  . LEFT HEART CATH AND CORONARY ANGIOGRAPHY N/A 07/28/2016   Procedure: Left Heart Cath and Coronary Angiography;  Surgeon: Jettie Booze, MD;  Location: [facility];  Service: Cardiovascular;  Laterality: N/A;  . LUMBAR FUSION  2005  . TEE WITHOUT CARDIOVERSION N/A 08/05/2016   Procedure: TRANSESOPHAGEAL ECHOCARDIOGRAM (TEE);  Surgeon: [name], MD;  Location: [facility];  Service: Open Heart Surgery;  Laterality: N/A;  . TONSILECTOMY, ADENOIDECTOMY, BILATERAL MYRINGOTOMY AND TUBES     Social History   Occupational History  . Not on file  Tobacco  Use  . Smoking status: Former Research scientist (life sciences)  . Smokeless tobacco: Never Used  Substance and Sexual Activity  . Alcohol use: No  . Drug use: No  . Sexual activity: Not on file

## 2017-08-29 DIAGNOSIS — R809 Proteinuria, unspecified: Secondary | ICD-10-CM | POA: Diagnosis not present

## 2017-08-29 DIAGNOSIS — I10 Essential (primary) hypertension: Secondary | ICD-10-CM | POA: Diagnosis not present

## 2017-08-29 DIAGNOSIS — D638 Anemia in other chronic diseases classified elsewhere: Secondary | ICD-10-CM | POA: Diagnosis not present

## 2017-08-29 DIAGNOSIS — N184 Chronic kidney disease, stage 4 (severe): Secondary | ICD-10-CM | POA: Diagnosis not present

## 2017-08-31 DIAGNOSIS — Z79899 Other long term (current) drug therapy: Secondary | ICD-10-CM | POA: Diagnosis not present

## 2017-08-31 DIAGNOSIS — N184 Chronic kidney disease, stage 4 (severe): Secondary | ICD-10-CM | POA: Diagnosis not present

## 2017-08-31 DIAGNOSIS — D631 Anemia in chronic kidney disease: Secondary | ICD-10-CM | POA: Diagnosis not present

## 2017-08-31 DIAGNOSIS — E559 Vitamin D deficiency, unspecified: Secondary | ICD-10-CM | POA: Diagnosis not present

## 2017-08-31 DIAGNOSIS — R809 Proteinuria, unspecified: Secondary | ICD-10-CM | POA: Diagnosis not present

## 2017-09-06 ENCOUNTER — Other Ambulatory Visit: Payer: Self-pay | Admitting: Family Medicine

## 2017-09-07 ENCOUNTER — Encounter: Payer: Self-pay | Admitting: *Deleted

## 2017-09-07 ENCOUNTER — Ambulatory Visit (INDEPENDENT_AMBULATORY_CARE_PROVIDER_SITE_OTHER): Payer: Medicare Other | Admitting: *Deleted

## 2017-09-07 VITALS — BP 148/54 | HR 62 | Ht 71.0 in | Wt 222.0 lb

## 2017-09-07 DIAGNOSIS — Z0000 Encounter for general adult medical examination without abnormal findings: Secondary | ICD-10-CM | POA: Diagnosis not present

## 2017-09-07 NOTE — Patient Instructions (Signed)
  [name] , Thank you for taking time to come for your Medicare Wellness Visit. I appreciate your ongoing commitment to your health goals. Please review the following plan we discussed and let me know if I can assist you in the future.   These are the goals we discussed: Stay as physically as tolerated.   This is a list of the screening recommended for you and due dates:  Health Maintenance  Topic Date Due  .  Hepatitis C: One time screening is recommended by Center for Disease Control  (CDC) for  adults born from [birth year] through 1966.   05/04/1945  . Complete foot exam   01/08/1955  . Tetanus Vaccine  01/08/1964  . Colon Cancer Screening  01/08/1995  . Pneumonia vaccines (1 of 2 - PCV13) 01/07/2010  . Flu Shot  09/14/2017  . Hemoglobin A1C  12/09/2017  . Eye exam for diabetics  04/29/2018

## 2017-09-07 NOTE — Progress Notes (Signed)
Subjective:   [name]. is a 72 y.o. male who presents for a Medicare Annual Wellness Visit. [name]. lives at home with his wife of 59 years. They have two sons and four grandchildren who he sees often.   Review of Systems    Patient reports that his overall health is better compared to last year.  Cardiac Risk Factors include: advanced age (>76men, >11 women);diabetes mellitus;dyslipidemia;male gender;hypertension;sedentary lifestyle;obesity (BMI >30kg/m2)(recent heart attack) He had a heart attack last year. Has not gotten back to his normal self since then.   Musculoskeletal: right hip pain and weakness. Hip replacement 20 years ago. Needs another but due to problems with heart and kidneys, they are reluctant to operate. Has chronic pain and uses a cane to walk. He can only walk for short distances. He's afraid it will give way and and he'll fall.   Psych: Depression due to inability to walk well enough to do the things he wants to do and the things he was accustomed to doing. 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 acetaminophen (TYLENOL) 325 MG tablet Take 2 tablets (650 mg total) by mouth every 6 (six) hours as needed for mild pain.  Marland Kitchen allopurinol (ZYLOPRIM) 100 MG tablet Take 1 tablet (100 mg total) by mouth daily.  Marland Kitchen alprostadil (EDEX) 40 MCG injection 40 mcg by Intracavitary route as needed for erectile dysfunction. use no more than 3 times per week  . AMITIZA 24 MCG capsule TAKE 1 CAPSULE (24 MCG TOTAL) BY MOUTH 2 (TWO) TIMES DAILY.  Marland Kitchen amLODipine (NORVASC) 10 MG tablet Take 1 tablet (10 mg total) by mouth daily.  Marland Kitchen aspirin 81 MG EC tablet Take 81 mg by mouth daily. Swallow whole.  . betamethasone valerate (VALISONE) 0.1 % cream Apply 1 application topically daily.   . carvedilol (COREG) 25 MG  tablet TAKE 1 & 1/2 TABLET BY MOUTH TWICE A DAY  . cloNIDine (CATAPRES) 0.1 MG tablet TAKE 2 TABLETS BY MOUTH THREE TIMES A DAY  . doxazosin (CARDURA) 4 MG tablet Take 1 tablet (4 mg total) by mouth 2 (two) times daily.  . ferrous sulfate 325 (65 FE) MG tablet Take 1 tablet (325 mg total) by mouth daily with breakfast.  . finasteride (PROSCAR) 5 MG tablet Take 5 mg by mouth daily.  . fluticasone (FLONASE) 50 MCG/ACT nasal spray USE 2 SPRAYS IN EACH NOSTRIL AT BEDTIME  . furosemide (LASIX) 40 MG tablet Take 1 tablet (40 mg total) by mouth daily.  Marland Kitchen gabapentin (NEURONTIN) 100 MG capsule Take 100 mg by mouth 3 (three) times daily.  . hydrALAZINE (APRESOLINE) 100 MG tablet Take 1 tablet (100 mg total) by mouth 4 (four) times daily -  before meals and at bedtime.  . Insulin Glargine (LANTUS SOLOSTAR) 100 UNIT/ML Solostar Pen Inject 20 Units into the skin daily at 10 pm.  . isosorbide dinitrate (ISORDIL) 20 MG tablet Take 2 tablets (40 mg total) by mouth 3 (three) times daily.  Marland Kitchen levothyroxine (SYNTHROID, LEVOTHROID) 100 MCG tablet TAKE 1 TABLET BY MOUTH EVERY DAY  . rosuvastatin (CRESTOR) 20 MG tablet Take 1 tablet (20 mg total) by mouth every evening.  . sodium bicarbonate 650 MG tablet Take 1 tablet (650 mg total) by mouth 2 (two) times daily.  . tamsulosin (FLOMAX) 0.4  MG CAPS capsule Take 0.8 mg by mouth daily.   No facility-administered encounter medications on file as of 09/07/2017.     Allergies (verified) Sulfa antibiotics   History: Past Medical History:  Diagnosis Date  . CAD (coronary artery disease), native coronary artery    s/p NSTEMI and CABG x 3 (LIMA to LAD, SVG to diagonal, SVG to dRCA) on 08/05/16  . Carotid artery occlusion   . Chronic kidney disease   . Diabetes mellitus without complication ([place])   . Hypertension    Past Surgical History:  Procedure Laterality Date  . APPENDECTOMY    . CORONARY ARTERY BYPASS GRAFT N/A 08/05/2016   Procedure: CORONARY ARTERY BYPASS  GRAFTING (CABG) x 3, LIMA to LAD, SVG to DIAGONAL, SVG to OM1, SVG to DISTAL RCA, USING LEFT MAMMARY ARTERY AND RIGHT GREATER SAPHENOUS VEIN HARVESTED ENDOSCOPICALLY;  Surgeon: [name], MD;  Location: [facility];  Service: Open Heart Surgery;  Laterality: N/A;  . JOINT REPLACEMENT Bilateral    hip replacements  . LEFT HEART CATH AND CORONARY ANGIOGRAPHY N/A 07/28/2016   Procedure: Left Heart Cath and Coronary Angiography;  Surgeon: Jettie Booze, MD;  Location: [facility];  Service: Cardiovascular;  Laterality: N/A;  . LUMBAR FUSION  2005  . TEE WITHOUT CARDIOVERSION N/A 08/05/2016   Procedure: TRANSESOPHAGEAL ECHOCARDIOGRAM (TEE);  Surgeon: [name], MD;  Location: [facility];  Service: Open Heart Surgery;  Laterality: N/A;  . TONSILECTOMY, ADENOIDECTOMY, BILATERAL MYRINGOTOMY AND TUBES     Family History  Problem Relation Age of Onset  . Heart disease Mother   . Hypertension Mother   . Arthritis Mother   . Cancer Mother   . Diabetes Mother   . Hyperlipidemia Mother   . Kidney disease Mother   . Hypertension Father   . Arthritis Father   . Cancer Father   . Diabetes Father   . Hyperlipidemia Father   . Hyperlipidemia Sister   . Hypertension Sister   . Vision loss Sister   . Hyperlipidemia Brother   . Hypertension Brother   . Arthritis Maternal Grandmother   . Arthritis Maternal Grandfather   . Arthritis Paternal Grandmother   . Arthritis Paternal Grandfather   . Asthma Sister   . Hyperlipidemia Sister   . Hypertension Sister   . Cancer Brother   . Diabetes Brother   . Hyperlipidemia Brother   . Hypertension Brother   . Cancer Brother   . Hyperlipidemia Brother   . Hypertension Brother   . Heart attack Son        22   Social History   Socioeconomic History  . Marital status: Married    Spouse name: Not on file  . Number of children: 2  . Years of education: 77  . Highest education level: 11th grade  Occupational History  . Occupation:  Retired    Comment: Chief Technology Officer  Social Needs  . Financial resource strain: Not very hard  . Food insecurity:    Worry: Never true    Inability: Never true  . Transportation needs:    Medical: No    Non-medical: No  Tobacco Use  . Smoking status: Former Research scientist (life sciences)  . Smokeless tobacco: Never Used  Substance and Sexual Activity  . Alcohol use: No  . Drug use: No  . Sexual activity: Yes  Lifestyle  . Physical activity:    Days per week: 0 days    Minutes per session: 0 min  .  Stress: Only a little  Relationships  . Social connections:    Talks on phone: More than three times a week    Gets together: More than three times a week    Attends religious service: More than 4 times per year    Active member of club or organization: Yes    Attends meetings of clubs or organizations: More than 4 times per year    Relationship status: Married  Other Topics Concern  . Not on file  Social History Narrative  . Not on file    Tobacco Use No.  Clinical Intake:     Pain : 0-10 Pain Score: 7  Pain Type: Chronic pain Pain Location: Hip Pain Orientation: Right Pain Descriptors / Indicators: Aching Pain Onset: More than a month ago Pain Frequency: Constant Effect of Pain on Daily Activities: extreme     Nutritional Status: BMI 25 -29 Overweight Diabetes: Yes CBG done?: No Did pt. bring in CBG monitor from home?: No           Activities of Daily Living In your present state of health, do you have any difficulty performing the following activities: 09/07/2017  Hearing? N  Vision? Y  Comment has yearly eye exams  Difficulty concentrating or making decisions? Y  Comment Has noticed some difficulty with memory since having heart attack  Walking or climbing stairs? Y  Comment right hip pain. Needs 2nd hip replacement  Dressing or bathing? N  Doing errands, shopping? N  Preparing Food and eating ? N  Using the Toilet? N  In the past six months, have  you accidently leaked urine? N  Do you have problems with loss of bowel control? N  Managing your Medications? N  Managing your Finances? N  Housekeeping or managing your Housekeeping? Y  Some recent data might be hidden    Diet Has 3 meals a day Drinks a lot of water and some coke  Exercise Current Exercise Habits: The patient does not participate in regular exercise at present, Exercise limited by: orthopedic condition(s);cardiac condition(s)(right hip needs replacement)    Depression Screen PHQ 2/9 Scores 09/07/2017 06/01/2017 03/10/2017  PHQ - 2 Score 3 4 0  PHQ- 9 Score 14 16 -     Fall Risk Fall Risk  09/07/2017 06/01/2017 03/10/2017  Falls in the past year? Yes Yes Yes  Number falls in past yr: 1 2 or more 2 or more  Injury with Fall? No No No  Comment - - pt was just out of the hospital when he fell from being weak  Risk Factor Category  - High Fall Risk -  Risk for fall due to : History of fall(s) Impaired balance/gait -  Follow up Falls prevention discussed Education provided -    Safety Is the patient's home free of loose throw rugs in walkways, pet beds, electrical cords, etc?   yes      Grab bars in the bathroom? no      Walkin shower? yes      Shower Seat? yes-needs a new one      Handrails on the stairs?   yes      Adequate lighting?   yes  Patient Care Team: Claretta Fraise, MD as PCP - General (Family Medicine) Debara Pickett Nadean Corwin, MD as Consulting Physician (Cardiology) Garald Balding, MD as Consulting Physician (Orthopedic Surgery) Fran Lowes, MD as Consulting Physician (Nephrology)  Hospitalizations, surgeries, and ER visits in previous 12 months ED 12/02/2016 for hypoglycemia  Objective:    Today's Vitals   09/07/17 0848 09/07/17 0852  BP: (!) 148/54   Pulse: 62   Weight: 222 lb (100.7 kg)   Height: 5\' 11"  (1.803 m)   PainSc:  7    Body mass index is 30.96 kg/m.  Advanced Directives 09/07/2017 12/02/2016 07/26/2016  Does Patient  Have a Medical Advance Directive? No No No  Would patient like information on creating a medical advance directive? No - Patient declined - No - Patient declined    Hearing/Vision  normal or No deficits noted during visit.   Cognitive Function: MMSE - Mini Mental State Exam 09/07/2017  Not completed: (No Data)  Orientation to time 5  Orientation to Place 4  Registration 3  Attention/ Calculation 4  Recall 0  Language- name 2 objects 2  Language- repeat 1  Language- follow 3 step command 3  Language- read & follow direction 1  Write a sentence 1  Copy design 1  Total score 25       Normal Cognitive Function Screening: No: may be related to change in activity level after heart attack and hip problems. Also his depression may be affecting his memory.     Immunizations and Health Maintenance Immunization History  Administered Date(s) Administered  . Influenza-Unspecified 11/23/2016   Health Maintenance Due  Topic Date Due  . Hepatitis C Screening  Apr 07, 1945  . FOOT EXAM  01/08/1955  . TETANUS/TDAP  01/08/1964  . COLONOSCOPY  01/08/1995  . PNA vac Low Risk Adult (1 of 2 - PCV13) 01/07/2010   Health Maintenance  Topic Date Due  . Hepatitis C Screening  [birth year]  . FOOT EXAM  01/08/1955  . TETANUS/TDAP  01/08/1964  . COLONOSCOPY  01/08/1995  . PNA vac Low Risk Adult (1 of 2 - PCV13) 01/07/2010  . INFLUENZA VACCINE  09/14/2017  . HEMOGLOBIN A1C  12/09/2017  . OPHTHALMOLOGY EXAM  04/29/2018        Assessment:   This is a routine wellness examination for Sire.      Plan:    Goals -Pain management -Increase activity level as tolerated  Health Maintenance Recommendations: Declined Tdap and Pneumonia vaccines  Declined colonoscopy Needs foot exam documented at next visit Needs better A1C control   Additional Screening Recommendations: Lung: Low Dose CT Chest recommended if Age 82-80 years, 30 pack-year currently smoking OR have quit w/in 15years.  Patient does not qualify. Hepatitis C Screening recommended: yes  Keep f/u with Claretta Fraise, MD and any other specialty appointments you may have Consider the possibility of starting medication for depression. Talk with Dr Livia Snellen at next appt or schedule one sooner if necessary. Consider therapist/counselor  Continue current medications Move carefully to avoid falls. Continue to use assistive devices like a cane or walker if needed. Modified chair exercises Reading or puzzles are a good way to exercise your brain Stay connected with friends and family. Social connections are beneficial to your emotional and mental health.   I have personally reviewed and noted the following in the patient's chart:   . Medical and social history . Use of alcohol, tobacco or illicit drugs  . Current medications and supplements . Functional ability and status . Nutritional status . Physical activity . Advanced directives . List of other physicians . Hospitalizations, surgeries, and ER visits in previous 12 months . Vitals . Screenings to include cognitive, depression, and falls . Referrals and appointments  In addition, I have reviewed and discussed with patient certain preventive  protocols, quality metrics, and best practice recommendations. A written personalized care plan for preventive services as well as general preventive health recommendations were provided to patient.     Chong Sicilian, RN   09/07/2017

## 2017-09-14 DIAGNOSIS — N184 Chronic kidney disease, stage 4 (severe): Secondary | ICD-10-CM | POA: Diagnosis not present

## 2017-09-14 DIAGNOSIS — D631 Anemia in chronic kidney disease: Secondary | ICD-10-CM | POA: Diagnosis not present

## 2017-09-26 ENCOUNTER — Ambulatory Visit: Payer: Medicare Other | Admitting: Family Medicine

## 2017-09-28 ENCOUNTER — Encounter: Payer: Self-pay | Admitting: Family Medicine

## 2017-09-28 DIAGNOSIS — N184 Chronic kidney disease, stage 4 (severe): Secondary | ICD-10-CM | POA: Diagnosis not present

## 2017-09-28 DIAGNOSIS — D631 Anemia in chronic kidney disease: Secondary | ICD-10-CM | POA: Diagnosis not present

## 2017-10-06 ENCOUNTER — Ambulatory Visit (INDEPENDENT_AMBULATORY_CARE_PROVIDER_SITE_OTHER): Payer: Medicare Other | Admitting: Family Medicine

## 2017-10-06 ENCOUNTER — Encounter: Payer: Self-pay | Admitting: Family Medicine

## 2017-10-06 VITALS — BP 159/57 | HR 62 | Temp 97.9°F | Ht 71.0 in | Wt 228.4 lb

## 2017-10-06 DIAGNOSIS — R011 Cardiac murmur, unspecified: Secondary | ICD-10-CM

## 2017-10-06 DIAGNOSIS — N184 Chronic kidney disease, stage 4 (severe): Secondary | ICD-10-CM | POA: Diagnosis not present

## 2017-10-06 DIAGNOSIS — E1022 Type 1 diabetes mellitus with diabetic chronic kidney disease: Secondary | ICD-10-CM | POA: Diagnosis not present

## 2017-10-06 MED ORDER — ALLOPURINOL 100 MG PO TABS: 100.0000 mg | ORAL_TABLET | Freq: Every day | ORAL | 1 refills | Status: DC

## 2017-10-06 MED ORDER — AMLODIPINE BESYLATE 10 MG PO TABS: 10.0000 mg | ORAL_TABLET | Freq: Every day | ORAL | 1 refills | Status: DC

## 2017-10-06 MED ORDER — SODIUM BICARBONATE 650 MG PO TABS: 650.0000 mg | ORAL_TABLET | Freq: Two times a day (BID) | ORAL | 1 refills | Status: DC

## 2017-10-06 MED ORDER — ROSUVASTATIN CALCIUM 20 MG PO TABS: 20.0000 mg | ORAL_TABLET | Freq: Every evening | ORAL | 1 refills | Status: DC

## 2017-10-06 MED ORDER — DOXAZOSIN MESYLATE 4 MG PO TABS: 4.0000 mg | ORAL_TABLET | Freq: Two times a day (BID) | ORAL | 1 refills | Status: DC

## 2017-10-06 MED ORDER — INSULIN GLARGINE 100 UNIT/ML SOLOSTAR PEN: 12.0000 [IU] | PEN_INJECTOR | Freq: Every day | SUBCUTANEOUS | 99 refills | Status: DC

## 2017-10-06 NOTE — Patient Instructions (Signed)

## 2017-10-09 ENCOUNTER — Encounter: Payer: Self-pay | Admitting: Family Medicine

## 2017-10-09 DIAGNOSIS — R011 Cardiac murmur, unspecified: Secondary | ICD-10-CM | POA: Insufficient documentation

## 2017-10-09 NOTE — Progress Notes (Signed)
Subjective:  Patient ID: [name]., male    DOB: 12/29/1945  Age: 72 y.o. MRN: [id number]  CC: Medical Management of Chronic Issues   HPI [name]. presents forFollow-up of diabetes. Patient checks blood sugar at home.  Log sheet attached showing blood sugars running from 62-74 fasting during the month of July.  This seems to have evened out to between 101 150 for the most part through August.  He is checking sporadically 2 hours after meal and readings are running from 80-211.  Log is attached as scanned. Patient denies symptoms such as polyuria, polydipsia, excessive hunger, nausea No significant hypoglycemic spells noted. Medications reviewed. Pt reports taking 10-15 units of glargine based on AM glucose Checking feet daily. Patient tells me he has never been told before that he has a heart murmur.  History [name] has a past medical history of CAD (coronary artery disease), native coronary artery, Carotid artery occlusion, Chronic kidney disease, Diabetes mellitus without complication ([name]), and Hypertension.   He has a past surgical history that includes Lumbar fusion (2005); LEFT HEART CATH AND CORONARY ANGIOGRAPHY (N/A, 07/28/2016); Coronary artery bypass graft (N/A, 08/05/2016); TEE without cardioversion (N/A, 08/05/2016); Joint replacement (Bilateral); Appendectomy; and Tonsilectomy, adenoidectomy, bilateral myringotomy and tubes.   His family history includes Arthritis in his father, maternal grandfather, maternal grandmother, mother, paternal grandfather, and paternal grandmother; Asthma in his sister; Cancer in his brother, brother, father, and mother; Diabetes in his brother, father, and mother; Heart attack in his son; Heart disease in his mother; Hyperlipidemia in his brother, brother, brother, father, mother, sister, and sister; Hypertension in his brother, brother, brother, father, mother, sister, and sister; Kidney disease in his mother; Vision loss in his  sister.He reports that he has quit smoking. He has never used smokeless tobacco. He reports that he does not drink alcohol or use drugs.  Current Outpatient Medications on File Prior to Visit  Medication Sig Dispense Refill  . acetaminophen (TYLENOL) 325 MG tablet Take 2 tablets (650 mg total) by mouth every 6 (six) hours as needed for mild pain.    Marland Kitchen alprostadil (EDEX) 40 MCG injection 40 mcg by Intracavitary route as needed for erectile dysfunction. use no more than 3 times per week 1 each 12  . AMITIZA 24 MCG capsule TAKE 1 CAPSULE (24 MCG TOTAL) BY MOUTH 2 (TWO) TIMES DAILY. 180 capsule 1  . aspirin 81 MG EC tablet Take 81 mg by mouth daily. Swallow whole.    . betamethasone valerate (VALISONE) 0.1 % cream Apply 1 application topically daily.   99  . carvedilol (COREG) 25 MG tablet TAKE 1 & 1/2 TABLET BY MOUTH TWICE A DAY 270 tablet 3  . cloNIDine (CATAPRES) 0.1 MG tablet TAKE 2 TABLETS BY MOUTH THREE TIMES A DAY 180 tablet 3  . ferrous sulfate 325 (65 FE) MG tablet Take 1 tablet (325 mg total) by mouth daily with breakfast.  3  . finasteride (PROSCAR) 5 MG tablet Take 5 mg by mouth daily.  3  . fluticasone (FLONASE) 50 MCG/ACT nasal spray USE 2 SPRAYS IN EACH NOSTRIL AT BEDTIME  9  . furosemide (LASIX) 40 MG tablet Take 1 tablet (40 mg total) by mouth daily.    Marland Kitchen gabapentin (NEURONTIN) 100 MG capsule Take 100 mg by mouth 3 (three) times daily.  3  . hydrALAZINE (APRESOLINE) 100 MG tablet Take 1 tablet (100 mg total) by mouth 4 (four) times daily -  before meals and at bedtime.  360 tablet 3  . isosorbide dinitrate (ISORDIL) 20 MG tablet Take 2 tablets (40 mg total) by mouth 3 (three) times daily. 180 tablet 5  . levothyroxine (SYNTHROID, LEVOTHROID) 100 MCG tablet TAKE 1 TABLET BY MOUTH EVERY DAY 90 tablet 1  . tamsulosin (FLOMAX) 0.4 MG CAPS capsule Take 0.8 mg by mouth daily.  11   No current facility-administered medications on file prior to visit.     ROS Review of Systems    Constitutional: Negative.   HENT: Negative.   Eyes: Negative for visual disturbance.  Respiratory: Negative for cough and shortness of breath.   Cardiovascular: Negative for chest pain and leg swelling.  Gastrointestinal: Negative for abdominal pain, diarrhea, nausea and vomiting.  Genitourinary: Negative for difficulty urinating.  Musculoskeletal: Negative for arthralgias and myalgias.  Skin: Negative for rash.  Neurological: Negative for headaches.  Psychiatric/Behavioral: Negative for sleep disturbance.    Objective:  BP (!) 159/57   Pulse 62   Temp 97.9 F (36.6 C) (Oral)   Ht 5\' 11"  (1.803 m)   Wt 228 lb 6 oz (103.6 kg)   BMI 31.85 kg/m   BP Readings from Last 3 Encounters:  10/06/17 (!) 159/57  09/07/17 (!) 148/54  08/23/17 (!) 173/77    Wt Readings from Last 3 Encounters:  10/06/17 228 lb 6 oz (103.6 kg)  09/07/17 222 lb (100.7 kg)  08/23/17 225 lb (102.1 kg)     Physical Exam  Constitutional: He appears well-developed and well-nourished.  HENT:  Head: Normocephalic and atraumatic.  Right Ear: Tympanic membrane and external ear normal. No decreased hearing is noted.  Left Ear: Tympanic membrane and external ear normal. No decreased hearing is noted.  Mouth/Throat: No oropharyngeal exudate or posterior oropharyngeal erythema.  Eyes: Pupils are equal, round, and reactive to light.  Neck: Normal range of motion. Neck supple.  Cardiovascular: Normal rate, regular rhythm and normal pulses.  Murmur heard.  Crescendo decrescendo systolic murmur is present with a grade of 2/6. Pulmonary/Chest: Breath sounds normal. No respiratory distress.  Abdominal: Soft. Bowel sounds are normal. He exhibits no mass. There is no tenderness.  Vitals reviewed.     Assessment & Plan:   [name] was seen today for medical management of chronic issues.  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 At 7 am  Other orders -     allopurinol (ZYLOPRIM) 100 MG tablet; Take 1 tablet (100 mg total) by mouth daily. -     amLODipine (NORVASC) 10 MG tablet; Take 1 tablet (10 mg total) by mouth daily. -     doxazosin (CARDURA) 4 MG tablet; Take 1 tablet (4 mg total) by mouth 2 (two) times daily. -     rosuvastatin (CRESTOR) 20 MG tablet; Take 1 tablet (20 mg total) by mouth every evening. -     sodium bicarbonate 650 MG tablet; Take 1 tablet (650 mg total) by mouth 2 (two) times daily.      I have changed Abbas L. Robotham Jr.'s Insulin Glargine. I am also having him maintain his betamethasone valerate, fluticasone, acetaminophen, ferrous sulfate, furosemide, isosorbide dinitrate, carvedilol, finasteride, gabapentin, alprostadil, cloNIDine, hydrALAZINE, aspirin, levothyroxine, tamsulosin, AMITIZA, allopurinol, amLODipine, doxazosin, rosuvastatin, and sodium bicarbonate.  Meds ordered this encounter  Medications  . Insulin Glargine (LANTUS SOLOSTAR) 100 UNIT/ML Solostar Pen    Sig: Inject 12 Units into the skin daily. At 7 am    Dispense:  5 pen    Refill:  PRN  . allopurinol (ZYLOPRIM) 100 MG tablet    Sig: Take 1 tablet (100 mg total) by mouth daily.    Dispense:  90 tablet    Refill:  1  . amLODipine (NORVASC) 10 MG tablet    Sig: Take 1 tablet (10 mg total) by mouth daily.    Dispense:  90 tablet    Refill:  1  . doxazosin (CARDURA) 4 MG tablet    Sig: Take 1 tablet (4 mg total) by mouth 2 (two) times daily.    Dispense:  180 tablet    Refill:  1  . rosuvastatin (CRESTOR) 20 MG tablet    Sig: Take 1 tablet (20 mg total) by mouth every evening.    Dispense:  90 tablet    Refill:  1  . sodium bicarbonate 650 MG tablet    Sig: Take 1 tablet (650 mg total) by mouth 2 (two) times daily.    Dispense:  180 tablet    Refill:  1    Patient advised to take the Lantus the same every day.  He should only change it based on trends rather than on single daily readings and it should be taken the same dose long-term in order to work best for him.  Follow-up: Return in about 3 months (around 01/06/2018).  Claretta Fraise, M.D.

## 2017-10-12 DIAGNOSIS — D631 Anemia in chronic kidney disease: Secondary | ICD-10-CM | POA: Diagnosis not present

## 2017-10-12 DIAGNOSIS — N184 Chronic kidney disease, stage 4 (severe): Secondary | ICD-10-CM | POA: Diagnosis not present

## 2017-10-15 ENCOUNTER — Other Ambulatory Visit: Payer: Self-pay | Admitting: Internal Medicine

## 2017-10-17 ENCOUNTER — Other Ambulatory Visit: Payer: Self-pay

## 2017-10-17 MED ORDER — CLONIDINE HCL 0.1 MG PO TABS: ORAL_TABLET | ORAL | 3 refills | Status: DC

## 2017-10-18 ENCOUNTER — Ambulatory Visit (HOSPITAL_COMMUNITY)
Admission: RE | Admit: 2017-10-18 | Discharge: 2017-10-18 | Disposition: A | Discharge status: Home / Self Care | Payer: Medicare Other | Source: Ambulatory Visit | Attending: Family Medicine | Admitting: Family Medicine

## 2017-10-18 DIAGNOSIS — E119 Type 2 diabetes mellitus without complications: Secondary | ICD-10-CM | POA: Insufficient documentation

## 2017-10-18 DIAGNOSIS — I214 Non-ST elevation (NSTEMI) myocardial infarction: Secondary | ICD-10-CM | POA: Insufficient documentation

## 2017-10-18 DIAGNOSIS — E785 Hyperlipidemia, unspecified: Secondary | ICD-10-CM | POA: Insufficient documentation

## 2017-10-18 DIAGNOSIS — I083 Combined rheumatic disorders of mitral, aortic and tricuspid valves: Secondary | ICD-10-CM | POA: Diagnosis not present

## 2017-10-18 DIAGNOSIS — R011 Cardiac murmur, unspecified: Secondary | ICD-10-CM | POA: Diagnosis not present

## 2017-10-18 DIAGNOSIS — I110 Hypertensive heart disease with heart failure: Secondary | ICD-10-CM | POA: Diagnosis not present

## 2017-10-18 DIAGNOSIS — I5043 Acute on chronic combined systolic (congestive) and diastolic (congestive) heart failure: Secondary | ICD-10-CM | POA: Insufficient documentation

## 2017-10-18 NOTE — Progress Notes (Signed)
*  PRELIMINARY RESULTS* Echocardiogram 2D Echocardiogram has been performed.  Samuel Germany 10/18/2017, 11:19 AM

## 2017-10-26 DIAGNOSIS — R809 Proteinuria, unspecified: Secondary | ICD-10-CM | POA: Diagnosis not present

## 2017-10-26 DIAGNOSIS — N184 Chronic kidney disease, stage 4 (severe): Secondary | ICD-10-CM | POA: Diagnosis not present

## 2017-10-26 DIAGNOSIS — E559 Vitamin D deficiency, unspecified: Secondary | ICD-10-CM | POA: Diagnosis not present

## 2017-10-26 DIAGNOSIS — Z79899 Other long term (current) drug therapy: Secondary | ICD-10-CM | POA: Diagnosis not present

## 2017-10-26 DIAGNOSIS — D631 Anemia in chronic kidney disease: Secondary | ICD-10-CM | POA: Diagnosis not present

## 2017-11-04 ENCOUNTER — Other Ambulatory Visit: Payer: Self-pay | Admitting: Internal Medicine

## 2017-11-09 DIAGNOSIS — Z79899 Other long term (current) drug therapy: Secondary | ICD-10-CM | POA: Diagnosis not present

## 2017-11-09 DIAGNOSIS — E559 Vitamin D deficiency, unspecified: Secondary | ICD-10-CM | POA: Diagnosis not present

## 2017-11-09 DIAGNOSIS — N184 Chronic kidney disease, stage 4 (severe): Secondary | ICD-10-CM | POA: Diagnosis not present

## 2017-11-09 DIAGNOSIS — D631 Anemia in chronic kidney disease: Secondary | ICD-10-CM | POA: Diagnosis not present

## 2017-11-09 DIAGNOSIS — R809 Proteinuria, unspecified: Secondary | ICD-10-CM | POA: Diagnosis not present

## 2017-11-14 ENCOUNTER — Ambulatory Visit (INDEPENDENT_AMBULATORY_CARE_PROVIDER_SITE_OTHER): Payer: Medicare Other | Admitting: Family Medicine

## 2017-11-14 ENCOUNTER — Encounter: Payer: Self-pay | Admitting: Family Medicine

## 2017-11-14 VITALS — BP 139/67 | HR 65 | Temp 97.7°F | Ht 71.0 in | Wt 229.0 lb

## 2017-11-14 DIAGNOSIS — Z23 Encounter for immunization: Secondary | ICD-10-CM | POA: Diagnosis not present

## 2017-11-14 DIAGNOSIS — N184 Chronic kidney disease, stage 4 (severe): Secondary | ICD-10-CM

## 2017-11-14 DIAGNOSIS — I5042 Chronic combined systolic (congestive) and diastolic (congestive) heart failure: Secondary | ICD-10-CM

## 2017-11-14 DIAGNOSIS — I10 Essential (primary) hypertension: Secondary | ICD-10-CM | POA: Diagnosis not present

## 2017-11-14 DIAGNOSIS — N185 Chronic kidney disease, stage 5: Secondary | ICD-10-CM

## 2017-11-14 DIAGNOSIS — E039 Hypothyroidism, unspecified: Secondary | ICD-10-CM | POA: Diagnosis not present

## 2017-11-14 DIAGNOSIS — E1022 Type 1 diabetes mellitus with diabetic chronic kidney disease: Secondary | ICD-10-CM

## 2017-11-14 DIAGNOSIS — E119 Type 2 diabetes mellitus without complications: Secondary | ICD-10-CM

## 2017-11-14 DIAGNOSIS — I110 Hypertensive heart disease with heart failure: Secondary | ICD-10-CM | POA: Diagnosis not present

## 2017-11-14 LAB — URINALYSIS
BILIRUBIN UA: NEGATIVE
GLUCOSE, UA: NEGATIVE
KETONES UA: NEGATIVE
Leukocytes, UA: NEGATIVE
NITRITE UA: NEGATIVE
RBC UA: NEGATIVE
Specific Gravity, UA: 1.01 (ref 1.005–1.030)
UUROB: 0.2 mg/dL (ref 0.2–1.0)
pH, UA: 5.5 (ref 5.0–7.5)

## 2017-11-14 LAB — BAYER DCA HB A1C WAIVED: HB A1C: 7.6 % — AB (ref <7.0)

## 2017-11-14 MED ORDER — LEVOTHYROXINE SODIUM 100 MCG PO TABS: 100.0000 ug | ORAL_TABLET | Freq: Every day | ORAL | 1 refills | Status: DC

## 2017-11-14 MED ORDER — INSULIN GLARGINE 100 UNIT/ML SOLOSTAR PEN: 12.0000 [IU] | PEN_INJECTOR | Freq: Every day | SUBCUTANEOUS | 99 refills | Status: DC

## 2017-11-14 NOTE — Progress Notes (Signed)
Subjective:  Patient ID: Jeff Ranks.,  male    DOB: 02-Aug-1945  Age: 72 y.o.    CC: Medical Management of Chronic Issues   HPI Jeff Pherigo. presents for  follow-up of hypertension. Patient has no history of headache chest pain or shortness of breath or recent cough. Patient also denies symptoms of TIA such as numbness weakness lateralizing. Patient denies side effects from medication. States taking it regularly.  Patient also  in for follow-up of elevated cholesterol. Doing well without complaints on current medication. Denies side effects  including myalgia and arthralgia and nausea. Also in today for liver function testing. Currently no chest pain, shortness of breath or other cardiovascular related symptoms noted.  Follow-up of diabetes. Patient does check blood sugar at home. Readings run between 100 and 200.  Occasionally approximately once or twice a month, he has it bump up as high as 400 postprandially. Patient denies symptoms such as excessive hunger or urinary frequency, excessive hunger, nausea No significant hypoglycemic spells noted. Medications reviewed. Pt reports taking them regularly. Pt. denies complication/adverse reaction today.    History [name] has a past medical history of Acute blood loss anemia, Acute pulmonary edema ([place]), CAD (coronary artery disease), native coronary artery, Carotid artery occlusion, Chronic kidney disease, Diabetes mellitus without complication ([place]), Hypertension, NSTEMI (non-ST elevated myocardial infarction) ([place]) (07/25/2016), and S/P CABG (coronary artery bypass graft).   He has a past surgical history that includes Lumbar fusion (2005); LEFT HEART CATH AND CORONARY ANGIOGRAPHY (N/A, 07/28/2016); Coronary artery bypass graft (N/A, 08/05/2016); TEE without cardioversion (N/A, 08/05/2016); Joint replacement (Bilateral); Appendectomy; and Tonsilectomy, adenoidectomy, bilateral myringotomy and tubes.   His family history includes  Arthritis in his father, maternal grandfather, maternal grandmother, mother, paternal grandfather, and paternal grandmother; Asthma in his sister; Cancer in his brother, brother, father, and mother; Diabetes in his brother, father, and mother; Heart attack in his son; Heart disease in his mother; Hyperlipidemia in his brother, brother, brother, father, mother, sister, and sister; Hypertension in his brother, brother, brother, father, mother, sister, and sister; Kidney disease in his mother; Vision loss in his sister.He reports that he has quit smoking. He has never used smokeless tobacco. He reports that he does not drink alcohol or use drugs.  Current Outpatient Medications on File Prior to Visit  Medication Sig Dispense Refill  . acetaminophen (TYLENOL) 325 MG tablet Take 2 tablets (650 mg total) by mouth every 6 (six) hours as needed for mild pain.    Marland Kitchen allopurinol (ZYLOPRIM) 100 MG tablet Take 1 tablet (100 mg total) by mouth daily. 90 tablet 1  . alprostadil (EDEX) 40 MCG injection 40 mcg by Intracavitary route as needed for erectile dysfunction. use no more than 3 times per week 1 each 12  . AMITIZA 24 MCG capsule TAKE 1 CAPSULE (24 MCG TOTAL) BY MOUTH 2 (TWO) TIMES DAILY. 180 capsule 1  . amLODipine (NORVASC) 10 MG tablet Take 1 tablet (10 mg total) by mouth daily. 90 tablet 1  . aspirin 81 MG EC tablet Take 81 mg by mouth daily. Swallow whole.    . betamethasone valerate (VALISONE) 0.1 % cream Apply 1 application topically daily.   99  . carvedilol (COREG) 25 MG tablet TAKE 1 & 1/2 TABLET BY MOUTH TWICE A DAY 270 tablet 3  . cloNIDine (CATAPRES) 0.1 MG tablet TAKE 2 TABLETS BY MOUTH THREE TIMES A DAY 540 tablet 3  . doxazosin (CARDURA) 4 MG tablet Take 1 tablet (4  mg total) by mouth 2 (two) times daily. 180 tablet 1  . ferrous sulfate 325 (65 FE) MG tablet Take 1 tablet (325 mg total) by mouth daily with breakfast.  3  . finasteride (PROSCAR) 5 MG tablet Take 5 mg by mouth daily.  3  .  fluticasone (FLONASE) 50 MCG/ACT nasal spray USE 2 SPRAYS IN EACH NOSTRIL AT BEDTIME  9  . furosemide (LASIX) 40 MG tablet Take 1 tablet (40 mg total) by mouth daily.    Marland Kitchen gabapentin (NEURONTIN) 100 MG capsule Take 100 mg by mouth 3 (three) times daily.  3  . hydrALAZINE (APRESOLINE) 100 MG tablet Take 1 tablet (100 mg total) by mouth 4 (four) times daily -  before meals and at bedtime. 360 tablet 3  . isosorbide dinitrate (ISORDIL) 20 MG tablet Take 2 tablets (40 mg total) by mouth 3 (three) times daily. 180 tablet 5  . isosorbide dinitrate (ISORDIL) 20 MG tablet TAKE 2 TABLETS (40 MG TOTAL) BY MOUTH 3 (THREE) TIMES DAILY. 540 tablet 1  . rosuvastatin (CRESTOR) 20 MG tablet Take 1 tablet (20 mg total) by mouth every evening. 90 tablet 1  . sodium bicarbonate 650 MG tablet Take 1 tablet (650 mg total) by mouth 2 (two) times daily. 180 tablet 1  . tamsulosin (FLOMAX) 0.4 MG CAPS capsule Take 0.8 mg by mouth daily.  11   No current facility-administered medications on file prior to visit.     ROS Review of Systems  Constitutional: Negative.   HENT: Negative.   Eyes: Negative for visual disturbance.  Respiratory: Negative for cough and shortness of breath.   Cardiovascular: Negative for chest pain and leg swelling.  Gastrointestinal: Negative for abdominal pain, diarrhea, nausea and vomiting.  Genitourinary: Negative for difficulty urinating.  Musculoskeletal: Negative for arthralgias and myalgias.  Skin: Negative for rash.  Neurological: Negative for headaches.  Psychiatric/Behavioral: Negative for sleep disturbance.    Objective:  BP 139/67   Pulse 65   Temp 97.7 F (36.5 C) (Oral)   Ht _0  (1.803 m)   Wt 229 lb (103.9 kg)   BMI 31.94 kg/m   BP Readings from Last 3 Encounters:  11/14/17 139/67  10/06/17 (!) 159/57  09/07/17 (!) 148/54    Wt Readings from Last 3 Encounters:  11/14/17 229 lb (103.9 kg)  10/06/17 228 lb 6 oz (103.6 kg)  09/07/17 222 lb (100.7 kg)      Physical Exam  Constitutional: He is oriented to person, place, and time. He appears well-developed and well-nourished. No distress.  HENT:  Head: Normocephalic and atraumatic.  Right Ear: External ear normal.  Left Ear: External ear normal.  Nose: Nose normal.  Mouth/Throat: Oropharynx is clear and moist.  Eyes: Pupils are equal, round, and reactive to light. Conjunctivae and EOM are normal.  Neck: Normal range of motion. Neck supple.  Cardiovascular: Normal rate, regular rhythm and normal heart sounds.  No murmur heard. Pulmonary/Chest: Effort normal and breath sounds normal. No respiratory distress. He has no wheezes. He has no rales.  Abdominal: Soft. There is no tenderness.  Musculoskeletal: Normal range of motion.  Neurological: He is alert and oriented to person, place, and time. He has normal reflexes.  Skin: Skin is warm and dry.  Psychiatric: He has a normal mood and affect. His behavior is normal. Judgment and thought content normal.    Diabetic Foot Exam - Simple   No data filed        Assessment & Plan:  [name] was seen today for medical management of chronic issues.  Diagnoses and all orders for this visit:  Essential hypertension  Type 1 diabetes mellitus with stage 4 chronic kidney disease (HCC) -     CBC with Differential/Platelet -     CMP14+EGFR -     Lipid panel -     Microalbumin / creatinine urine ratio -     Bayer DCA Hb A1c Waived -     Urinalysis -     Discontinue: Insulin Glargine (LANTUS SOLOSTAR) 100 UNIT/ML Solostar Pen; Inject 12 Units into the skin daily. At 7 am -     Insulin Glargine (LANTUS SOLOSTAR) 100 UNIT/ML Solostar Pen; Inject 12 Units into the skin daily. At 7 am  Hypothyroidism, unspecified type -     TSH + free T4  Diabetes mellitus type 2 in nonobese [facility])  Hypertensive heart disease with chronic combined systolic and diastolic congestive heart failure (HCC)  CKD (chronic kidney disease) stage 5, GFR less than 15  ml/min (HCC)  Encounter for immunization -     Flu vaccine HIGH DOSE PF  Other orders -     levothyroxine (SYNTHROID, LEVOTHROID) 100 MCG tablet; Take 1 tablet (100 mcg total) by mouth daily.   I have changed [name] levothyroxine. I am also having him maintain his betamethasone valerate, fluticasone, acetaminophen, ferrous sulfate, furosemide, isosorbide dinitrate, carvedilol, finasteride, gabapentin, alprostadil, hydrALAZINE, aspirin, tamsulosin, AMITIZA, allopurinol, amLODipine, doxazosin, rosuvastatin, sodium bicarbonate, cloNIDine, isosorbide dinitrate, and Insulin Glargine.  Meds ordered this encounter  Medications  . levothyroxine (SYNTHROID, LEVOTHROID) 100 MCG tablet    Sig: Take 1 tablet (100 mcg total) by mouth daily.    Dispense:  90 tablet    Refill:  1  . DISCONTD: Insulin Glargine (LANTUS SOLOSTAR) 100 UNIT/ML Solostar Pen    Sig: Inject 12 Units into the skin daily. At 7 am    Dispense:  5 pen    Refill:  PRN  . Insulin Glargine (LANTUS SOLOSTAR) 100 UNIT/ML Solostar Pen    Sig: Inject 12 Units into the skin daily. At 7 am    Dispense:  5 pen    Refill:  PRN     Follow-up: Return in about 3 months (around 02/14/2018).  Claretta Fraise, M.D.

## 2017-11-15 ENCOUNTER — Encounter: Payer: Self-pay | Admitting: *Deleted

## 2017-11-15 ENCOUNTER — Other Ambulatory Visit: Payer: Self-pay | Admitting: Family Medicine

## 2017-11-15 ENCOUNTER — Other Ambulatory Visit: Payer: Self-pay

## 2017-11-15 ENCOUNTER — Other Ambulatory Visit: Payer: Self-pay | Admitting: Internal Medicine

## 2017-11-15 ENCOUNTER — Other Ambulatory Visit: Payer: Self-pay | Admitting: *Deleted

## 2017-11-15 DIAGNOSIS — D631 Anemia in chronic kidney disease: Secondary | ICD-10-CM

## 2017-11-15 DIAGNOSIS — R710 Precipitous drop in hematocrit: Secondary | ICD-10-CM

## 2017-11-15 DIAGNOSIS — N185 Chronic kidney disease, stage 5: Principal | ICD-10-CM

## 2017-11-15 LAB — CBC WITH DIFFERENTIAL/PLATELET
BASOS: 1 %
Basophils Absolute: 0 10*3/uL (ref 0.0–0.2)
EOS (ABSOLUTE): 0.1 10*3/uL (ref 0.0–0.4)
EOS: 2 %
HEMATOCRIT: 25.8 % — AB (ref 37.5–51.0)
Hemoglobin: 8.4 g/dL — CL (ref 13.0–17.7)
IMMATURE GRANS (ABS): 0 10*3/uL (ref 0.0–0.1)
IMMATURE GRANULOCYTES: 0 %
LYMPHS: 20 %
Lymphocytes Absolute: 1 10*3/uL (ref 0.7–3.1)
MCH: 29.4 pg (ref 26.6–33.0)
MCHC: 32.6 g/dL (ref 31.5–35.7)
MCV: 90 fL (ref 79–97)
MONOCYTES: 10 %
MONOS ABS: 0.5 10*3/uL (ref 0.1–0.9)
Neutrophils Absolute: 3.3 10*3/uL (ref 1.4–7.0)
Neutrophils: 67 %
Platelets: 168 10*3/uL (ref 150–450)
RBC: 2.86 x10E6/uL — ABNORMAL LOW (ref 4.14–5.80)
RDW: 14.9 % (ref 12.3–15.4)
WBC: 4.9 10*3/uL (ref 3.4–10.8)

## 2017-11-15 LAB — LIPID PANEL
CHOL/HDL RATIO: 3.7 ratio (ref 0.0–5.0)
Cholesterol, Total: 107 mg/dL (ref 100–199)
HDL: 29 mg/dL — AB (ref >39)
LDL Calculated: 57 mg/dL (ref 0–99)
Triglycerides: 105 mg/dL (ref 0–149)
VLDL Cholesterol Cal: 21 mg/dL (ref 5–40)

## 2017-11-15 LAB — CMP14+EGFR
ALT: 6 IU/L (ref 0–44)
AST: 7 IU/L (ref 0–40)
Albumin/Globulin Ratio: 1.2 (ref 1.2–2.2)
Albumin: 4 g/dL (ref 3.5–4.8)
Alkaline Phosphatase: 67 IU/L (ref 39–117)
BUN/Creatinine Ratio: 19 (ref 10–24)
BUN: 82 mg/dL (ref 8–27)
Bilirubin Total: 0.2 mg/dL (ref 0.0–1.2)
CALCIUM: 9 mg/dL (ref 8.6–10.2)
CO2: 17 mmol/L — ABNORMAL LOW (ref 20–29)
CREATININE: 4.42 mg/dL — AB (ref 0.76–1.27)
Chloride: 99 mmol/L (ref 96–106)
GFR calc Af Amer: 14 mL/min/{1.73_m2} — ABNORMAL LOW (ref >59)
GFR, EST NON AFRICAN AMERICAN: 12 mL/min/{1.73_m2} — AB (ref >59)
GLOBULIN, TOTAL: 3.4 g/dL (ref 1.5–4.5)
Glucose: 192 mg/dL — ABNORMAL HIGH (ref 65–99)
Potassium: 3.9 mmol/L (ref 3.5–5.2)
SODIUM: 135 mmol/L (ref 134–144)
Total Protein: 7.4 g/dL (ref 6.0–8.5)

## 2017-11-15 LAB — TSH+FREE T4
Free T4: 1 ng/dL (ref 0.82–1.77)
TSH: 0.449 u[IU]/mL — AB (ref 0.450–4.500)

## 2017-11-15 LAB — MICROALBUMIN / CREATININE URINE RATIO
CREATININE, UR: 44 mg/dL
MICROALB/CREAT RATIO: 140.9 mg/g{creat} — AB (ref 0.0–30.0)
Microalbumin, Urine: 62 ug/mL

## 2017-11-21 DIAGNOSIS — R3915 Urgency of urination: Secondary | ICD-10-CM | POA: Diagnosis not present

## 2017-11-23 DIAGNOSIS — D631 Anemia in chronic kidney disease: Secondary | ICD-10-CM | POA: Diagnosis not present

## 2017-11-23 DIAGNOSIS — N184 Chronic kidney disease, stage 4 (severe): Secondary | ICD-10-CM | POA: Diagnosis not present

## 2017-12-07 DIAGNOSIS — N184 Chronic kidney disease, stage 4 (severe): Secondary | ICD-10-CM | POA: Diagnosis not present

## 2017-12-07 DIAGNOSIS — D631 Anemia in chronic kidney disease: Secondary | ICD-10-CM | POA: Diagnosis not present

## 2017-12-21 DIAGNOSIS — E559 Vitamin D deficiency, unspecified: Secondary | ICD-10-CM | POA: Diagnosis not present

## 2017-12-21 DIAGNOSIS — R809 Proteinuria, unspecified: Secondary | ICD-10-CM | POA: Diagnosis not present

## 2017-12-21 DIAGNOSIS — N184 Chronic kidney disease, stage 4 (severe): Secondary | ICD-10-CM | POA: Diagnosis not present

## 2017-12-21 DIAGNOSIS — D631 Anemia in chronic kidney disease: Secondary | ICD-10-CM | POA: Diagnosis not present

## 2017-12-21 DIAGNOSIS — Z79899 Other long term (current) drug therapy: Secondary | ICD-10-CM | POA: Diagnosis not present

## 2018-01-02 DIAGNOSIS — I10 Essential (primary) hypertension: Secondary | ICD-10-CM | POA: Diagnosis not present

## 2018-01-02 DIAGNOSIS — R809 Proteinuria, unspecified: Secondary | ICD-10-CM | POA: Diagnosis not present

## 2018-01-02 DIAGNOSIS — D638 Anemia in other chronic diseases classified elsewhere: Secondary | ICD-10-CM | POA: Diagnosis not present

## 2018-01-02 DIAGNOSIS — N184 Chronic kidney disease, stage 4 (severe): Secondary | ICD-10-CM | POA: Diagnosis not present

## 2018-01-04 DIAGNOSIS — Z79899 Other long term (current) drug therapy: Secondary | ICD-10-CM | POA: Diagnosis not present

## 2018-01-04 DIAGNOSIS — N184 Chronic kidney disease, stage 4 (severe): Secondary | ICD-10-CM | POA: Diagnosis not present

## 2018-01-04 DIAGNOSIS — E559 Vitamin D deficiency, unspecified: Secondary | ICD-10-CM | POA: Diagnosis not present

## 2018-01-04 DIAGNOSIS — D631 Anemia in chronic kidney disease: Secondary | ICD-10-CM | POA: Diagnosis not present

## 2018-01-04 DIAGNOSIS — R809 Proteinuria, unspecified: Secondary | ICD-10-CM | POA: Diagnosis not present

## 2018-01-09 ENCOUNTER — Encounter: Payer: Self-pay | Admitting: Internal Medicine

## 2018-01-09 ENCOUNTER — Ambulatory Visit: Payer: Medicare Other | Admitting: Internal Medicine

## 2018-01-09 VITALS — BP 162/64 | HR 67 | Ht 73.0 in | Wt 236.2 lb

## 2018-01-09 DIAGNOSIS — I10 Essential (primary) hypertension: Secondary | ICD-10-CM | POA: Diagnosis not present

## 2018-01-09 DIAGNOSIS — Z951 Presence of aortocoronary bypass graft: Secondary | ICD-10-CM

## 2018-01-09 DIAGNOSIS — E782 Mixed hyperlipidemia: Secondary | ICD-10-CM

## 2018-01-09 DIAGNOSIS — N185 Chronic kidney disease, stage 5: Secondary | ICD-10-CM | POA: Diagnosis not present

## 2018-01-09 NOTE — Patient Instructions (Signed)
Medication Instructions:  Continue current medications If you need a refill on your cardiac medications before your next appointment, please call your pharmacy.   Lab work: NONE If you have labs (blood work) drawn today and your tests are completely normal, you will receive your results only by: . MyChart Message (if you have MyChart) OR . A paper copy in the mail If you have any lab test that is abnormal or we need to change your treatment, we will call you to review the results.  Testing/Procedures: NONE  Follow-Up: At [facility], you and your health needs are our priority.  As part of our continuing mission to provide you with exceptional heart care, we have created designated Provider Care Teams.  These Care Teams include your primary Cardiologist (physician) and Advanced Practice Providers (APPs -  Physician Assistants and Nurse Practitioners) who all work together to provide you with the care you need, when you need it. You will need a follow up appointment in 12 months.  Please call our office 2 months in advance to schedule this appointment.  You may see Dr. Hilty or one of the following Advanced Practice Providers on your designated Care Team: Hao Meng, PA-C . Angela Duke, PA-C  Any Other Special Instructions Will Be Listed Below (If Applicable).    

## 2018-01-09 NOTE — Progress Notes (Signed)
OFFICE NOTE  Chief Complaint:  Follow-up  Primary Care Physician: Claretta Fraise, MD  HPI:  [name]. is a 72 y.o. male with a past medial history significant for hypertension, IDDM, BPH, prior herniated disk in 2005 sp L3/L4 diskectomy and fusion, mild bilateral ICA stenosis in 2014 and EF 60-65% with moderate LVH by echo in 2014, there was focal distal anterior and anteroseptal thinning and akinesis, diastolic dysfunction and mild to moderate pulmonary hypertension at the time. He also has CKD with creatinine of 2.65 in 2015 (now 3.83) -sees Dr. Hinda Lenis in [place]. He now presented today to [facility] with complaints of chest pain since last Saturday (started in the abdomen and worked up to the chest) and progressive dyspnea, ultimately brought in by EMS. He has required BIPAP and was weaned off prior to transport, but then restarted when he arrived here for high O2 requirement. Labs significant for Creatinine of 3.83 (GFR 19), Troponin T of 0.26, BNP of 19,808, anemia with H/H 9.1/27.5, glucose of 261, no leukocytosis. CXR shows mild cardiomegaly, CHF/consolidation - suspect pneumonia with superimposed pulmonary edema/ARDS, EKG shows NSR at 76 (personally reviewed) with anterior and lateral ST depression.  He ultimately underwent heart catheterization found to have multivessel coronary disease and then subsequently had three-vessel bypass with LIMA to LAD, SVG to diagonal and SVG to RCA on 08/05/16.  Since then, he was seen in follow-up by Dr. Oval Linsey and noted to be euvolemic on Lasix.  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  Clonidine 0.1 mg 3 times daily was added.  Subsequently he has had a number of hypertension clinic follow-up appointments and blood pressure remains elevated.  Overall he feels well, though.  12/15/2016  [name] has no new complaints today.  Blood pressure remains elevated.  He denies any worsening shortness of  breath, weight gain or lower extremity edema.  Fortunately he is not on dialysis.  01/30/2017  [name] returns today for follow-up.  He says his blood pressures appeared somewhat better with 3 times daily clonidine.  He occasionally misses his night dose and was not taking it before going to bed which is usually around 8:00.  He says he wakes up at about 11:45 to go to the bathroom and then sometimes remembers to take it then, but has trouble falling back asleep.  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  With regards to this he is asymptomatic and denies any abdominal pain.  07/17/2017  [name] returns today for follow-up.  He denies any worsening chest pain or shortness of breath.  Blood pressure had been elevated however recently he has been on additional medication including hydralazine 100 mg 4 times daily.  He is ready on clonidine 0.2 mg 3 times daily, along with amlodipine and carvedilol.  Blood pressure is better controlled on this regimen.  Unfortunately he has chronic kidney disease.  At some point recently he was counseled on having a fistula placed however he has declined.  Weight is down about 6 pounds since I last saw him.  He also reports taking very high-dose aspirin for arthritis.  In fact he takes 500 mg every 6 hours.  He was not aware that this could adversely affect his kidney function.  01/09/2018  [name] returns today for follow-up.  He continues to have borderline end-stage renal disease but is  not yet wanted to have a fistula placed.  He is followed by Dr. Lowanda Foster.  He has not started on dialysis.  He did have recent labs by his PCP recently a month ago that showed total cholesterol 107, triglycerides 105, HDL 29 and LDL of 57.  He is on high intensity statin.  Glucose was 192 and hemoglobin A1c was 7.6 improved from 10.1 about 7 months ago.  PMHx:  Past Medical History:    Diagnosis Date  . Acute blood loss anemia   . Acute pulmonary edema (HCC)   . CAD (coronary artery disease), native coronary artery    s/p NSTEMI and CABG x 3 (LIMA to LAD, SVG to diagonal, SVG to dRCA) on 08/05/16  . Carotid artery occlusion   . Chronic kidney disease   . Diabetes mellitus without complication ([place])   . Hypertension   . NSTEMI (non-ST elevated myocardial infarction) ([place]) 07/25/2016  . S/P CABG (coronary artery bypass graft)     Past Surgical History:  Procedure Laterality Date  . APPENDECTOMY    . CORONARY ARTERY BYPASS GRAFT N/A 08/05/2016   Procedure: CORONARY ARTERY BYPASS GRAFTING (CABG) x 3, LIMA to LAD, SVG to DIAGONAL, SVG to OM1, SVG to DISTAL RCA, USING LEFT MAMMARY ARTERY AND RIGHT GREATER SAPHENOUS VEIN HARVESTED ENDOSCOPICALLY;  Surgeon: [name], MD;  Location: [facility];  Service: Open Heart Surgery;  Laterality: N/A;  . JOINT REPLACEMENT Bilateral    hip replacements  . LEFT HEART CATH AND CORONARY ANGIOGRAPHY N/A 07/28/2016   Procedure: Left Heart Cath and Coronary Angiography;  Surgeon: Jettie Booze, MD;  Location: [facility];  Service: Cardiovascular;  Laterality: N/A;  . LUMBAR FUSION  2005  . TEE WITHOUT CARDIOVERSION N/A 08/05/2016   Procedure: TRANSESOPHAGEAL ECHOCARDIOGRAM (TEE);  Surgeon: [name], MD;  Location: [facility];  Service: Open Heart Surgery;  Laterality: N/A;  . TONSILECTOMY, ADENOIDECTOMY, BILATERAL MYRINGOTOMY AND TUBES      FAMHx:  Family History  Problem Relation Age of Onset  . Heart disease Mother   . Hypertension Mother   . Arthritis Mother   . Cancer Mother   . Diabetes Mother   . Hyperlipidemia Mother   . Kidney disease Mother   . Hypertension Father   . Arthritis Father   . Cancer Father   . Diabetes Father   . Hyperlipidemia Father   . Hyperlipidemia Sister   . Hypertension Sister   . Vision loss Sister   . Hyperlipidemia Brother   . Hypertension Brother   . Arthritis Maternal  Grandmother   . Arthritis Maternal Grandfather   . Arthritis Paternal Grandmother   . Arthritis Paternal Grandfather   . Asthma Sister   . Hyperlipidemia Sister   . Hypertension Sister   . Cancer Brother   . Diabetes Brother   . Hyperlipidemia Brother   . Hypertension Brother   . Cancer Brother   . Hyperlipidemia Brother   . Hypertension Brother   . Heart attack Son        5    SOCHx:   reports that he has quit smoking. He has never used smokeless tobacco. He reports that he does not drink alcohol or use drugs.  ALLERGIES:  Allergies  Allergen Reactions  . Sulfa Antibiotics Hives    ROS: Pertinent items noted in HPI and remainder of comprehensive ROS otherwise negative.  HOME MEDS: Current Outpatient Medications on File Prior to Visit  Medication Sig Dispense Refill  .  acetaminophen (TYLENOL) 325 MG tablet Take 2 tablets (650 mg total) by mouth every 6 (six) hours as needed for mild pain.    Marland Kitchen allopurinol (ZYLOPRIM) 100 MG tablet Take 1 tablet (100 mg total) by mouth daily. 90 tablet 1  . alprostadil (EDEX) 40 MCG injection 40 mcg by Intracavitary route as needed for erectile dysfunction. use no more than 3 times per week 1 each 12  . AMITIZA 24 MCG capsule TAKE 1 CAPSULE (24 MCG TOTAL) BY MOUTH 2 (TWO) TIMES DAILY. 180 capsule 1  . amLODipine (NORVASC) 10 MG tablet Take 1 tablet (10 mg total) by mouth daily. 90 tablet 1  . aspirin 81 MG EC tablet Take 81 mg by mouth daily. Swallow whole.    . betamethasone valerate (VALISONE) 0.1 % cream Apply 1 application topically daily.   99  . carvedilol (COREG) 25 MG tablet TAKE 1 & 1/2 TABLET BY MOUTH TWICE A DAY 270 tablet 3  . cloNIDine (CATAPRES) 0.1 MG tablet TAKE 2 TABLETS BY MOUTH THREE TIMES A DAY 540 tablet 3  . doxazosin (CARDURA) 4 MG tablet Take 1 tablet (4 mg total) by mouth 2 (two) times daily. 180 tablet 1  . ferrous sulfate 325 (65 FE) MG tablet Take 1 tablet (325 mg total) by mouth daily with breakfast.  3  .  finasteride (PROSCAR) 5 MG tablet Take 5 mg by mouth daily.  3  . fluticasone (FLONASE) 50 MCG/ACT nasal spray USE 2 SPRAYS IN EACH NOSTRIL AT BEDTIME  9  . furosemide (LASIX) 40 MG tablet Take 1 tablet (40 mg total) by mouth daily.    Marland Kitchen gabapentin (NEURONTIN) 100 MG capsule Take 100 mg by mouth 3 (three) times daily.  3  . hydrALAZINE (APRESOLINE) 100 MG tablet Take 1 tablet (100 mg total) by mouth 4 (four) times daily -  before meals and at bedtime. 360 tablet 3  . Insulin Glargine (LANTUS SOLOSTAR) 100 UNIT/ML Solostar Pen Inject 12 Units into the skin daily. At 7 am 5 pen PRN  . isosorbide dinitrate (ISORDIL) 20 MG tablet TAKE 2 TABLETS (40 MG TOTAL) BY MOUTH 3 (THREE) TIMES DAILY. 540 tablet 1  . levothyroxine (SYNTHROID, LEVOTHROID) 100 MCG tablet Take 1 tablet (100 mcg total) by mouth daily. 90 tablet 1  . rosuvastatin (CRESTOR) 20 MG tablet Take 1 tablet (20 mg total) by mouth every evening. 90 tablet 1  . sodium bicarbonate 650 MG tablet Take 1 tablet (650 mg total) by mouth 2 (two) times daily. 180 tablet 1  . tamsulosin (FLOMAX) 0.4 MG CAPS capsule Take 0.8 mg by mouth daily.  11   No current facility-administered medications on file prior to visit.     LABS/IMAGING: No results found for this or any previous visit (from the past 48 hour(s)). No results found.  LIPID PANEL:    Component Value Date/Time   CHOL 107 11/14/2017 1636   TRIG 105 11/14/2017 1636   HDL 29 (L) 11/14/2017 1636   CHOLHDL 3.7 11/14/2017 1636   CHOLHDL 2.6 07/26/2016 0022   VLDL 16 07/26/2016 0022   LDLCALC 57 11/14/2017 1636     WEIGHTS: Wt Readings from Last 3 Encounters:  01/09/18 236 lb 3.2 oz (107.1 kg)  11/14/17 229 lb (103.9 kg)  10/06/17 228 lb 6 oz (103.6 kg)    VITALS: BP (!) 162/64   Pulse 67   Ht 6\' 1"  (1.854 m)   Wt 236 lb 3.2 oz (107.1 kg)   BMI 31.16 kg/m  EXAM: General appearance: alert and no distress Neck: no carotid bruit, no JVD and thyroid not enlarged, symmetric, no  tenderness/mass/nodules Lungs: clear to auscultation bilaterally Heart: regular rate and rhythm, S1, S2 normal, no murmur, click, rub or gallop Abdomen: soft, non-tender; bowel sounds normal; no masses,  no organomegaly Extremities: extremities normal, atraumatic, no cyanosis or edema Pulses: 2+ and symmetric Skin: Skin color, texture, turgor normal. No rashes or lesions Neurologic: Grossly normal Psych: Pleasant  EKG: Sinus rhythm first-degree AV block, incomplete left bundle branch block and lateral T wave changes at 67-personally reviewed  ASSESSMENT: 1. CAD status post three-vessel CABG (07/2016) 2. Acute on chronic systolic and diastolic congestive heart failure 3. Hypertensive heart disease 4. Dyslipidemia 5. PAD 6. CKD 5  PLAN: 1.   [name] is stable but has not yet progressed to dialysis.  He is also not yet had a fistula placed.  He is followed by Dr. Lowanda Foster.  He is angina free and now almost 2 years after bypass.  Blood pressure was elevated today however has been better controlled at home.  His cholesterol is at goal of LDL less than 70.  His hemoglobin A1c is improved significantly over the past 7 months down to 7.6.  No changes to his medicines today.  Follow-up with me annually or sooner as necessary.  Pixie Casino, MD, [facility], [place] Director of the [facility]  Cardiovascular [facility] Diplomate of the American Board of Clinical Lipidology Attending Cardiologist  Direct Dial: [phone]  Fax: [phone]  Website:  [email or web address] 01/09/2018, 11:22 AM

## 2018-01-18 DIAGNOSIS — E559 Vitamin D deficiency, unspecified: Secondary | ICD-10-CM | POA: Diagnosis not present

## 2018-01-18 DIAGNOSIS — D631 Anemia in chronic kidney disease: Secondary | ICD-10-CM | POA: Diagnosis not present

## 2018-01-18 DIAGNOSIS — D509 Iron deficiency anemia, unspecified: Secondary | ICD-10-CM | POA: Diagnosis not present

## 2018-01-18 DIAGNOSIS — Z79899 Other long term (current) drug therapy: Secondary | ICD-10-CM | POA: Diagnosis not present

## 2018-01-18 DIAGNOSIS — N184 Chronic kidney disease, stage 4 (severe): Secondary | ICD-10-CM | POA: Diagnosis not present

## 2018-01-18 DIAGNOSIS — R809 Proteinuria, unspecified: Secondary | ICD-10-CM | POA: Diagnosis not present

## 2018-01-23 ENCOUNTER — Encounter: Payer: Self-pay | Admitting: Family Medicine

## 2018-01-23 ENCOUNTER — Ambulatory Visit (INDEPENDENT_AMBULATORY_CARE_PROVIDER_SITE_OTHER): Payer: Medicare Other | Admitting: Family Medicine

## 2018-01-23 VITALS — BP 139/62 | HR 69 | Temp 97.0°F | Ht 73.0 in | Wt 238.0 lb

## 2018-01-23 DIAGNOSIS — K4030 Unilateral inguinal hernia, with obstruction, without gangrene, not specified as recurrent: Secondary | ICD-10-CM | POA: Diagnosis not present

## 2018-01-23 DIAGNOSIS — R059 Cough, unspecified: Secondary | ICD-10-CM

## 2018-01-23 DIAGNOSIS — R05 Cough: Secondary | ICD-10-CM | POA: Diagnosis not present

## 2018-01-23 MED ORDER — FUROSEMIDE 40 MG PO TABS: 40.0000 mg | ORAL_TABLET | Freq: Two times a day (BID) | ORAL | 5 refills | Status: DC

## 2018-01-23 MED ORDER — BENZONATATE 200 MG PO CAPS: 200.0000 mg | ORAL_CAPSULE | Freq: Three times a day (TID) | ORAL | 0 refills | Status: DC | PRN

## 2018-01-23 NOTE — Progress Notes (Signed)
Subjective:  Patient ID: [name]., male    DOB: 08-09-1945  Age: 72 y.o. MRN: [id number]  CC: Hernia (Right groin, 2 month, intermittent pain)   HPI [name]. presents for swelling a lot in legs. Concerned about hernia.  First noticed several weeks ago.  Large bulging in the right inguinal region.  It is sore.  It seems to be progressing and that is getting larger and more sore.  It never seems to get smaller.  He cannot push it in.Marland Kitchen  He is concerned that it could be dangerous.  Patient also has a cough that is not constant but when he gets started coughing just cannot stop for long time.  Denies shortness of breath.  Cough does produce some shortness of breath.  Patient is having moderate amount of swelling he says he is swollen right now.  Depression screen [facility] 2/9 01/23/2018 11/14/2017 10/06/2017  Decreased Interest 0 1 2  Down, Depressed, Hopeless 0 0 1  PHQ - 2 Score 0 1 3  Altered sleeping - 1 2  Tired, decreased energy - 0 1  Change in appetite - 1 2  Feeling bad or failure about yourself  - 2 3  Trouble concentrating - 1 2  Moving slowly or fidgety/restless - 0 1  Suicidal thoughts - 0 0  PHQ-9 Score - 6 14  Difficult doing work/chores - Somewhat difficult Somewhat difficult    History Amaziah has a past medical history of Acute blood loss anemia, Acute pulmonary edema ([place]), CAD (coronary artery disease), native coronary artery, Carotid artery occlusion, Chronic kidney disease, Diabetes mellitus without complication ([place]), Hypertension, NSTEMI (non-ST elevated myocardial infarction) ([place]) (07/25/2016), and S/P CABG (coronary artery bypass graft).   He has a past surgical history that includes Lumbar fusion (2005); LEFT HEART CATH AND CORONARY ANGIOGRAPHY (N/A, 07/28/2016); Coronary artery bypass graft (N/A, 08/05/2016); TEE without cardioversion (N/A, 08/05/2016); Joint replacement (Bilateral); Appendectomy; and Tonsilectomy, adenoidectomy, bilateral myringotomy and  tubes.   His family history includes Arthritis in his father, maternal grandfather, maternal grandmother, mother, paternal grandfather, and paternal grandmother; Asthma in his sister; Cancer in his brother, brother, father, and mother; Diabetes in his brother, father, and mother; Heart attack in his son; Heart disease in his mother; Hyperlipidemia in his brother, brother, brother, father, mother, sister, and sister; Hypertension in his brother, brother, brother, father, mother, sister, and sister; Kidney disease in his mother; Vision loss in his sister.He reports that he has quit smoking. He has never used smokeless tobacco. He reports that he does not drink alcohol or use drugs.    ROS Review of Systems  Constitutional: Negative.   HENT: Negative.   Eyes: Negative for visual disturbance.  Respiratory: Positive for cough and shortness of breath.   Cardiovascular: Positive for leg swelling. Negative for chest pain.  Gastrointestinal: Negative for abdominal pain, diarrhea, nausea and vomiting.  Genitourinary: Negative for difficulty urinating.  Musculoskeletal: Negative for arthralgias and myalgias.  Skin: Negative for rash.  Neurological: Negative for headaches.  Psychiatric/Behavioral: Negative for sleep disturbance.    Objective:  BP 139/62   Pulse 69   Temp (!) 97 F (36.1 C) (Oral)   Ht 6\' 1"  (1.854 m)   Wt 238 lb (108 kg)   BMI 31.40 kg/m   BP Readings from Last 3 Encounters:  01/23/18 139/62  01/09/18 (!) 162/64  11/14/17 139/67    Wt Readings from Last 3 Encounters:  01/23/18 238 lb (108 kg)  01/09/18  236 lb 3.2 oz (107.1 kg)  11/14/17 229 lb (103.9 kg)     Physical Exam  Constitutional: He is oriented to person, place, and time. He appears well-developed and well-nourished. No distress.  HENT:  Head: Normocephalic and atraumatic.  Right Ear: External ear normal.  Left Ear: External ear normal.  Nose: Nose normal.  Mouth/Throat: Oropharynx is clear and moist.    Eyes: Pupils are equal, round, and reactive to light. Conjunctivae and EOM are normal.  Neck: Normal range of motion. Neck supple.  Cardiovascular: Normal rate, regular rhythm and normal heart sounds.  No murmur heard. Pulmonary/Chest: Effort normal and breath sounds normal. No respiratory distress. He has no wheezes. He has no rales.  Abdominal: Soft. There is no tenderness. A hernia is present. Hernia confirmed positive in the right inguinal area (Could not be reduced).  Musculoskeletal: Normal range of motion. He exhibits edema (2-3+ both lower extremities).  Neurological: He is alert and oriented to person, place, and time. He has normal reflexes.  Skin: Skin is warm and dry.  Psychiatric: He has a normal mood and affect. His behavior is normal. Judgment and thought content normal.      Assessment & Plan:   Caio was seen today for hernia.  Diagnoses and all orders for this visit:  Inguinal hernia with irreducibility -     Ambulatory referral to General Surgery  Cough -     DG Chest 2 View; Future  Other orders -     furosemide (LASIX) 40 MG tablet; Take 1 tablet (40 mg total) by mouth 2 (two) times daily. Take one when you first get out of bed. The next one four hours later. -     benzonatate (TESSALON) 200 MG capsule; Take 1 capsule (200 mg total) by mouth 3 (three) times daily as needed for cough.       I have changed Demarcus L. Biven Jr.'s furosemide. I am also having him start on benzonatate. Additionally, I am having him maintain his betamethasone valerate, fluticasone, acetaminophen, ferrous sulfate, carvedilol, finasteride, gabapentin, alprostadil, hydrALAZINE, aspirin, tamsulosin, AMITIZA, allopurinol, amLODipine, doxazosin, rosuvastatin, sodium bicarbonate, cloNIDine, isosorbide dinitrate, levothyroxine, and Insulin Glargine.  Allergies as of 01/23/2018      Reactions   Sulfa Antibiotics Hives      Medication List        Accurate as of 01/23/18  5:39 PM.  Always use your most recent med list.          acetaminophen 325 MG tablet Commonly known as:  TYLENOL Take 2 tablets (650 mg total) by mouth every 6 (six) hours as needed for mild pain.   allopurinol 100 MG tablet Commonly known as:  ZYLOPRIM Take 1 tablet (100 mg total) by mouth daily.   alprostadil 40 MCG injection Commonly known as:  EDEX 40 mcg by Intracavitary route as needed for erectile dysfunction. use no more than 3 times per week   AMITIZA 24 MCG capsule Generic drug:  lubiprostone TAKE 1 CAPSULE (24 MCG TOTAL) BY MOUTH 2 (TWO) TIMES DAILY.   amLODipine 10 MG tablet Commonly known as:  NORVASC Take 1 tablet (10 mg total) by mouth daily.   aspirin 81 MG EC tablet Take 81 mg by mouth daily. Swallow whole.   benzonatate 200 MG capsule Commonly known as:  TESSALON Take 1 capsule (200 mg total) by mouth 3 (three) times daily as needed for cough.   betamethasone valerate 0.1 % cream Commonly known as:  VALISONE Apply 1 application  topically daily.   carvedilol 25 MG tablet Commonly known as:  COREG TAKE 1 & 1/2 TABLET BY MOUTH TWICE A DAY   cloNIDine 0.1 MG tablet Commonly known as:  CATAPRES TAKE 2 TABLETS BY MOUTH THREE TIMES A DAY   doxazosin 4 MG tablet Commonly known as:  CARDURA Take 1 tablet (4 mg total) by mouth 2 (two) times daily.   ferrous sulfate 325 (65 FE) MG tablet Take 1 tablet (325 mg total) by mouth daily with breakfast.   finasteride 5 MG tablet Commonly known as:  PROSCAR Take 5 mg by mouth daily.   fluticasone 50 MCG/ACT nasal spray Commonly known as:  FLONASE USE 2 SPRAYS IN EACH NOSTRIL AT BEDTIME   furosemide 40 MG tablet Commonly known as:  LASIX Take 1 tablet (40 mg total) by mouth 2 (two) times daily. Take one when you first get out of bed. The next one four hours later.   gabapentin 100 MG capsule Commonly known as:  NEURONTIN Take 100 mg by mouth 3 (three) times daily.   hydrALAZINE 100 MG tablet Commonly known as:   APRESOLINE Take 1 tablet (100 mg total) by mouth 4 (four) times daily -  before meals and at bedtime.   Insulin Glargine 100 UNIT/ML Solostar Pen Commonly known as:  LANTUS Inject 12 Units into the skin daily. At 7 am   isosorbide dinitrate 20 MG tablet Commonly known as:  ISORDIL TAKE 2 TABLETS (40 MG TOTAL) BY MOUTH 3 (THREE) TIMES DAILY.   levothyroxine 100 MCG tablet Commonly known as:  SYNTHROID, LEVOTHROID Take 1 tablet (100 mcg total) by mouth daily.   rosuvastatin 20 MG tablet Commonly known as:  CRESTOR Take 1 tablet (20 mg total) by mouth every evening.   sodium bicarbonate 650 MG tablet Take 1 tablet (650 mg total) by mouth 2 (two) times daily.   tamsulosin 0.4 MG Caps capsule Commonly known as:  FLOMAX Take 0.8 mg by mouth daily.        Follow-up: Return in about 6 weeks (around 03/06/2018).  Claretta Fraise, M.D.

## 2018-01-25 DIAGNOSIS — E559 Vitamin D deficiency, unspecified: Secondary | ICD-10-CM | POA: Diagnosis not present

## 2018-01-25 DIAGNOSIS — D631 Anemia in chronic kidney disease: Secondary | ICD-10-CM | POA: Diagnosis not present

## 2018-01-25 DIAGNOSIS — Z79899 Other long term (current) drug therapy: Secondary | ICD-10-CM | POA: Diagnosis not present

## 2018-01-25 DIAGNOSIS — N184 Chronic kidney disease, stage 4 (severe): Secondary | ICD-10-CM | POA: Diagnosis not present

## 2018-01-25 DIAGNOSIS — R809 Proteinuria, unspecified: Secondary | ICD-10-CM | POA: Diagnosis not present

## 2018-01-25 DIAGNOSIS — D509 Iron deficiency anemia, unspecified: Secondary | ICD-10-CM | POA: Diagnosis not present

## 2018-01-29 ENCOUNTER — Other Ambulatory Visit: Payer: Self-pay | Admitting: Family Medicine

## 2018-02-01 DIAGNOSIS — D631 Anemia in chronic kidney disease: Secondary | ICD-10-CM | POA: Diagnosis not present

## 2018-02-01 DIAGNOSIS — D509 Iron deficiency anemia, unspecified: Secondary | ICD-10-CM | POA: Diagnosis not present

## 2018-02-01 DIAGNOSIS — R809 Proteinuria, unspecified: Secondary | ICD-10-CM | POA: Diagnosis not present

## 2018-02-01 DIAGNOSIS — N184 Chronic kidney disease, stage 4 (severe): Secondary | ICD-10-CM | POA: Diagnosis not present

## 2018-02-01 DIAGNOSIS — Z79899 Other long term (current) drug therapy: Secondary | ICD-10-CM | POA: Diagnosis not present

## 2018-02-01 DIAGNOSIS — E559 Vitamin D deficiency, unspecified: Secondary | ICD-10-CM | POA: Diagnosis not present

## 2018-02-08 DIAGNOSIS — R809 Proteinuria, unspecified: Secondary | ICD-10-CM | POA: Diagnosis not present

## 2018-02-08 DIAGNOSIS — Z79899 Other long term (current) drug therapy: Secondary | ICD-10-CM | POA: Diagnosis not present

## 2018-02-08 DIAGNOSIS — N184 Chronic kidney disease, stage 4 (severe): Secondary | ICD-10-CM | POA: Diagnosis not present

## 2018-02-08 DIAGNOSIS — E559 Vitamin D deficiency, unspecified: Secondary | ICD-10-CM | POA: Diagnosis not present

## 2018-02-08 DIAGNOSIS — D631 Anemia in chronic kidney disease: Secondary | ICD-10-CM | POA: Diagnosis not present

## 2018-02-08 DIAGNOSIS — D509 Iron deficiency anemia, unspecified: Secondary | ICD-10-CM | POA: Diagnosis not present

## 2018-02-15 ENCOUNTER — Encounter: Payer: Self-pay | Admitting: General Surgery

## 2018-02-15 ENCOUNTER — Ambulatory Visit: Payer: Medicare Other | Admitting: General Surgery

## 2018-02-15 VITALS — BP 150/60 | HR 69 | Temp 99.3°F | Resp 20 | Wt 226.8 lb

## 2018-02-15 DIAGNOSIS — K4090 Unilateral inguinal hernia, without obstruction or gangrene, not specified as recurrent: Secondary | ICD-10-CM | POA: Diagnosis not present

## 2018-02-15 NOTE — Patient Instructions (Signed)

## 2018-02-16 DIAGNOSIS — N185 Chronic kidney disease, stage 5: Secondary | ICD-10-CM | POA: Diagnosis not present

## 2018-02-16 DIAGNOSIS — E039 Hypothyroidism, unspecified: Secondary | ICD-10-CM | POA: Diagnosis not present

## 2018-02-16 DIAGNOSIS — Z794 Long term (current) use of insulin: Secondary | ICD-10-CM | POA: Diagnosis not present

## 2018-02-16 DIAGNOSIS — R262 Difficulty in walking, not elsewhere classified: Secondary | ICD-10-CM | POA: Diagnosis not present

## 2018-02-16 DIAGNOSIS — E559 Vitamin D deficiency, unspecified: Secondary | ICD-10-CM | POA: Diagnosis not present

## 2018-02-16 DIAGNOSIS — Z7951 Long term (current) use of inhaled steroids: Secondary | ICD-10-CM | POA: Diagnosis not present

## 2018-02-16 DIAGNOSIS — Z7982 Long term (current) use of aspirin: Secondary | ICD-10-CM | POA: Diagnosis not present

## 2018-02-16 DIAGNOSIS — D631 Anemia in chronic kidney disease: Secondary | ICD-10-CM | POA: Diagnosis not present

## 2018-02-16 DIAGNOSIS — Z801 Family history of malignant neoplasm of trachea, bronchus and lung: Secondary | ICD-10-CM | POA: Diagnosis not present

## 2018-02-16 DIAGNOSIS — Z87891 Personal history of nicotine dependence: Secondary | ICD-10-CM | POA: Diagnosis not present

## 2018-02-16 DIAGNOSIS — I2510 Atherosclerotic heart disease of native coronary artery without angina pectoris: Secondary | ICD-10-CM | POA: Diagnosis not present

## 2018-02-16 DIAGNOSIS — R42 Dizziness and giddiness: Secondary | ICD-10-CM | POA: Diagnosis not present

## 2018-02-16 DIAGNOSIS — I5042 Chronic combined systolic (congestive) and diastolic (congestive) heart failure: Secondary | ICD-10-CM | POA: Diagnosis not present

## 2018-02-16 DIAGNOSIS — E785 Hyperlipidemia, unspecified: Secondary | ICD-10-CM | POA: Diagnosis not present

## 2018-02-16 DIAGNOSIS — D509 Iron deficiency anemia, unspecified: Secondary | ICD-10-CM | POA: Diagnosis not present

## 2018-02-16 DIAGNOSIS — I132 Hypertensive heart and chronic kidney disease with heart failure and with stage 5 chronic kidney disease, or end stage renal disease: Secondary | ICD-10-CM | POA: Diagnosis not present

## 2018-02-16 DIAGNOSIS — I252 Old myocardial infarction: Secondary | ICD-10-CM | POA: Diagnosis not present

## 2018-02-16 DIAGNOSIS — Z79899 Other long term (current) drug therapy: Secondary | ICD-10-CM | POA: Diagnosis not present

## 2018-02-16 DIAGNOSIS — D649 Anemia, unspecified: Secondary | ICD-10-CM | POA: Diagnosis not present

## 2018-02-16 DIAGNOSIS — E1122 Type 2 diabetes mellitus with diabetic chronic kidney disease: Secondary | ICD-10-CM | POA: Diagnosis not present

## 2018-02-16 DIAGNOSIS — R809 Proteinuria, unspecified: Secondary | ICD-10-CM | POA: Diagnosis not present

## 2018-02-16 DIAGNOSIS — M109 Gout, unspecified: Secondary | ICD-10-CM | POA: Diagnosis not present

## 2018-02-16 DIAGNOSIS — Z96643 Presence of artificial hip joint, bilateral: Secondary | ICD-10-CM | POA: Diagnosis not present

## 2018-02-16 NOTE — Progress Notes (Signed)
[name].; [id number]; 02/29/1945   HPI Patient is a 73 year old black male who was referred to my care by Dr. Livia Snellen for evaluation treatment of right inguinal hernia.  Patient states he recently noted the right inguinal hernia.  Is made worse with straining.  It does reduce on its own when he lies down.  He denies any nausea or vomiting.  He has minimal discomfort when the hernia sticking out.  He does walk with a cane.  He currently has 0 out of 10 abdominal pain.  No nausea or vomiting have been noted.  He has been told in the past that he is a poor operative candidate. Past Medical History:  Diagnosis Date  . Acute blood loss anemia   . Acute pulmonary edema (HCC)   . CAD (coronary artery disease), native coronary artery    s/p NSTEMI and CABG x 3 (LIMA to LAD, SVG to diagonal, SVG to dRCA) on 08/05/16  . Carotid artery occlusion   . Chronic kidney disease   . Diabetes mellitus without complication ([place])   . Hypertension   . NSTEMI (non-ST elevated myocardial infarction) ([place]) 07/25/2016  . S/P CABG (coronary artery bypass graft)     Past Surgical History:  Procedure Laterality Date  . APPENDECTOMY    . CORONARY ARTERY BYPASS GRAFT N/A 08/05/2016   Procedure: CORONARY ARTERY BYPASS GRAFTING (CABG) x 3, LIMA to LAD, SVG to DIAGONAL, SVG to OM1, SVG to DISTAL RCA, USING LEFT MAMMARY ARTERY AND RIGHT GREATER SAPHENOUS VEIN HARVESTED ENDOSCOPICALLY;  Surgeon: [name], MD;  Location: [facility];  Service: Open Heart Surgery;  Laterality: N/A;  . JOINT REPLACEMENT Bilateral    hip replacements  . LEFT HEART CATH AND CORONARY ANGIOGRAPHY N/A 07/28/2016   Procedure: Left Heart Cath and Coronary Angiography;  Surgeon: Jettie Booze, MD;  Location: [facility];  Service: Cardiovascular;  Laterality: N/A;  . LUMBAR FUSION  2005  . TEE WITHOUT CARDIOVERSION N/A 08/05/2016   Procedure: TRANSESOPHAGEAL ECHOCARDIOGRAM (TEE);  Surgeon: [name], MD;  Location: [facility];  Service: Open Heart Surgery;  Laterality: N/A;  . TONSILECTOMY, ADENOIDECTOMY, BILATERAL MYRINGOTOMY AND TUBES      Family History  Problem Relation Age of Onset  . Heart disease Mother   . Hypertension Mother   . Arthritis Mother   . Cancer Mother   . Diabetes Mother   . Hyperlipidemia Mother   . Kidney disease Mother   . Hypertension Father   . Arthritis Father   . Cancer Father   . Diabetes Father   . Hyperlipidemia Father   . Hyperlipidemia Sister   . Hypertension Sister   . Vision loss Sister   . Hyperlipidemia Brother   . Hypertension Brother   . Arthritis Maternal Grandmother   . Arthritis Maternal Grandfather   . Arthritis Paternal Grandmother   . Arthritis Paternal Grandfather   . Asthma Sister   . Hyperlipidemia Sister   . Hypertension Sister   . Cancer Brother   . Diabetes Brother   . Hyperlipidemia Brother   . Hypertension Brother   . Cancer Brother   . Hyperlipidemia Brother   . Hypertension Brother   . Heart attack Son        45    Current Outpatient Medications on File Prior to Visit  Medication Sig Dispense Refill  . acetaminophen (TYLENOL) 325 MG tablet Take 2 tablets (650 mg total) by mouth every 6 (six) hours as needed for  mild pain.    Marland Kitchen allopurinol (ZYLOPRIM) 100 MG tablet Take 1 tablet (100 mg total) by mouth daily. 90 tablet 1  . alprostadil (EDEX) 40 MCG injection 40 mcg by Intracavitary route as needed for erectile dysfunction. use no more than 3 times per week 1 each 12  . AMITIZA 24 MCG capsule TAKE 1 CAPSULE (24 MCG TOTAL) BY MOUTH 2 (TWO) TIMES DAILY. 180 capsule 1  . amLODipine (NORVASC) 10 MG tablet Take 1 tablet (10 mg total) by mouth daily. 90 tablet 1  . aspirin 81 MG EC tablet Take 81 mg by mouth daily. Swallow whole.    . benzonatate (TESSALON) 200 MG capsule Take 1 capsule (200 mg total) by mouth 3 (three) times daily as needed for cough. 20 capsule 0  . betamethasone valerate (VALISONE) 0.1 % cream Apply 1 application  topically daily.   99  . carvedilol (COREG) 25 MG tablet TAKE 1 & 1/2 TABLET BY MOUTH TWICE A DAY 270 tablet 3  . cloNIDine (CATAPRES) 0.1 MG tablet TAKE 2 TABLETS BY MOUTH THREE TIMES A DAY 540 tablet 3  . doxazosin (CARDURA) 4 MG tablet Take 1 tablet (4 mg total) by mouth 2 (two) times daily. 180 tablet 1  . ferrous sulfate 325 (65 FE) MG tablet Take 1 tablet (325 mg total) by mouth daily with breakfast.  3  . finasteride (PROSCAR) 5 MG tablet Take 5 mg by mouth daily.  3  . fluticasone (FLONASE) 50 MCG/ACT nasal spray USE 2 SPRAYS IN EACH NOSTRIL AT BEDTIME 16 g 5  . furosemide (LASIX) 40 MG tablet Take 1 tablet (40 mg total) by mouth 2 (two) times daily. Take one when you first get out of bed. The next one four hours later. 60 tablet 5  . gabapentin (NEURONTIN) 100 MG capsule Take 100 mg by mouth 3 (three) times daily.  3  . hydrALAZINE (APRESOLINE) 100 MG tablet Take 1 tablet (100 mg total) by mouth 4 (four) times daily -  before meals and at bedtime. 360 tablet 3  . Insulin Glargine (LANTUS SOLOSTAR) 100 UNIT/ML Solostar Pen Inject 12 Units into the skin daily. At 7 am 5 pen PRN  . isosorbide dinitrate (ISORDIL) 20 MG tablet TAKE 2 TABLETS (40 MG TOTAL) BY MOUTH 3 (THREE) TIMES DAILY. 540 tablet 1  . levothyroxine (SYNTHROID, LEVOTHROID) 100 MCG tablet Take 1 tablet (100 mcg total) by mouth daily. 90 tablet 1  . rosuvastatin (CRESTOR) 20 MG tablet Take 1 tablet (20 mg total) by mouth every evening. 90 tablet 1  . sodium bicarbonate 650 MG tablet Take 1 tablet (650 mg total) by mouth 2 (two) times daily. 180 tablet 1  . tamsulosin (FLOMAX) 0.4 MG CAPS capsule Take 0.8 mg by mouth daily.  11   No current facility-administered medications on file prior to visit.     Allergies  Allergen Reactions  . Sulfa Antibiotics Hives    Social History   Substance and Sexual Activity  Alcohol Use No    Social History   Tobacco Use  Smoking Status Former Smoker  Smokeless Tobacco Never Used     Review of Systems  Constitutional: Negative.   HENT: Positive for sinus pain.   Eyes: Positive for blurred vision.  Respiratory: Positive for wheezing.   Cardiovascular: Negative.   Gastrointestinal: Positive for heartburn.  Genitourinary: Positive for frequency.  Musculoskeletal: Positive for joint pain.  Skin: Negative.   Neurological: Negative.   Endo/Heme/Allergies: Negative.   Psychiatric/Behavioral: Negative.  Objective   Vitals:   02/15/18 1035  BP: (!) 150/60  Pulse: 69  Resp: 20  Temp: 99.3 F (37.4 C)    Physical Exam Vitals signs reviewed.  Constitutional:      Appearance: Normal appearance.     Comments: Ambulates with a cane.  HENT:     Head: Normocephalic and atraumatic.  Cardiovascular:     Rate and Rhythm: Normal rate and regular rhythm.     Pulses: Normal pulses.     Heart sounds: No murmur. No friction rub. No gallop.   Pulmonary:     Effort: Pulmonary effort is normal. No respiratory distress.     Breath sounds: Normal breath sounds. No stridor. No wheezing, rhonchi or rales.  Abdominal:     General: Abdomen is flat. Bowel sounds are normal. There is no distension.     Palpations: Abdomen is soft. There is no mass.     Tenderness: There is no abdominal tenderness. There is no guarding or rebound.     Hernia: A hernia is present.     Comments: Easily reducible right inguinal hernia.    Genitourinary:    Comments: Genitourinary examination is within normal limits. Skin:    General: Skin is warm.  Neurological:     Mental Status: He is alert and oriented to person, place, and time.     Gait: Gait abnormal.     Assessment  Right inguinal hernia, currently asymptomatic Plan   Patient is a high risk surgical candidate and I would defer any repair of his inguinal hernia until he is having more signs and symptoms of incarceration.  Instructions were given on how to reduce the hernia should it become incarcerated.  Should surgery be  required, may need to be done at a tertiary care center due to his multiple comorbidities.  The patient understands and agrees.  He does not want surgery unless absolutely necessary.  Follow-up as needed.

## 2018-02-17 DIAGNOSIS — N185 Chronic kidney disease, stage 5: Secondary | ICD-10-CM | POA: Diagnosis not present

## 2018-02-17 DIAGNOSIS — D649 Anemia, unspecified: Secondary | ICD-10-CM | POA: Diagnosis not present

## 2018-02-17 DIAGNOSIS — E1122 Type 2 diabetes mellitus with diabetic chronic kidney disease: Secondary | ICD-10-CM | POA: Diagnosis not present

## 2018-02-17 DIAGNOSIS — I132 Hypertensive heart and chronic kidney disease with heart failure and with stage 5 chronic kidney disease, or end stage renal disease: Secondary | ICD-10-CM | POA: Diagnosis not present

## 2018-02-17 DIAGNOSIS — I5042 Chronic combined systolic (congestive) and diastolic (congestive) heart failure: Secondary | ICD-10-CM | POA: Diagnosis not present

## 2018-02-19 ENCOUNTER — Encounter: Payer: Self-pay | Admitting: Family Medicine

## 2018-02-19 ENCOUNTER — Ambulatory Visit (INDEPENDENT_AMBULATORY_CARE_PROVIDER_SITE_OTHER): Payer: Medicare Other | Admitting: Family Medicine

## 2018-02-19 VITALS — BP 150/55 | HR 68 | Temp 97.4°F | Ht 73.0 in | Wt 233.4 lb

## 2018-02-19 DIAGNOSIS — R600 Localized edema: Secondary | ICD-10-CM | POA: Diagnosis not present

## 2018-02-19 DIAGNOSIS — N184 Chronic kidney disease, stage 4 (severe): Secondary | ICD-10-CM | POA: Diagnosis not present

## 2018-02-19 DIAGNOSIS — E1022 Type 1 diabetes mellitus with diabetic chronic kidney disease: Secondary | ICD-10-CM | POA: Diagnosis not present

## 2018-02-19 LAB — BAYER DCA HB A1C WAIVED: HB A1C (BAYER DCA - WAIVED): 7.1 % — ABNORMAL HIGH (ref <7.0)

## 2018-02-19 MED ORDER — AMLODIPINE BESYLATE 10 MG PO TABS: 10.0000 mg | ORAL_TABLET | Freq: Every day | ORAL | 1 refills | Status: DC

## 2018-02-19 MED ORDER — ALPROSTADIL (VASODILATOR) 40 MCG IC KIT: 40.0000 ug | PACK | INTRACAVERNOUS | 12 refills | Status: DC | PRN

## 2018-02-19 MED ORDER — AMITIZA 24 MCG PO CAPS: 24.0000 ug | ORAL_CAPSULE | Freq: Two times a day (BID) | ORAL | 1 refills | Status: DC

## 2018-02-19 MED ORDER — SODIUM BICARBONATE 650 MG PO TABS: 650.0000 mg | ORAL_TABLET | Freq: Two times a day (BID) | ORAL | 1 refills | Status: DC

## 2018-02-19 MED ORDER — ROSUVASTATIN CALCIUM 20 MG PO TABS: 20.0000 mg | ORAL_TABLET | Freq: Every evening | ORAL | 1 refills | Status: DC

## 2018-02-19 MED ORDER — BETAMETHASONE VALERATE 0.1 % EX CREA: 1.0000 "application " | TOPICAL_CREAM | Freq: Every day | CUTANEOUS | 99 refills | Status: DC

## 2018-02-19 MED ORDER — DOXAZOSIN MESYLATE 4 MG PO TABS: 4.0000 mg | ORAL_TABLET | Freq: Two times a day (BID) | ORAL | 1 refills | Status: DC

## 2018-02-19 MED ORDER — ALLOPURINOL 100 MG PO TABS: 100.0000 mg | ORAL_TABLET | Freq: Every day | ORAL | 1 refills | Status: DC

## 2018-02-19 MED ORDER — LEVOTHYROXINE SODIUM 100 MCG PO TABS: 100.0000 ug | ORAL_TABLET | Freq: Every day | ORAL | 1 refills | Status: DC

## 2018-02-19 NOTE — Progress Notes (Signed)
Subjective:  Patient ID: Jeff Ranks.,  male    DOB: 09-18-1945  Age: 73 y.o.    CC: Medical Management of Chronic Issues   HPI [name]. presents for  follow-up of hypertension. Patient has no history of headache chest pain or shortness of breath or recent cough. Patient also denies symptoms of TIA such as numbness weakness lateralizing. Patient denies side effects from medication. States taking it regularly.  Patient also  in for follow-up of elevated cholesterol. Doing well without complaints on current medication. Denies side effects  including myalgia and arthralgia and nausea. Also in today for liver function testing. Currently no chest pain, shortness of breath or other cardiovascular related symptoms noted.  Follow-up of diabetes. Patient does check blood sugar at home. Readings run between 130 and 140 Patient denies symptoms such as excessive hunger or urinary frequency, excessive hunger, nausea Occasionally has a low.  The lowest was 43.  This responds to eating cereal.  He also skips his Lantus on those occasions. Medications reviewed. Pt reports taking them regularly. Pt. denies complication/adverse reaction today.   Patient presents for follow-up on  thyroid. The patient has a history of hypothyroidism for many years. It has been stable recently. Pt. denies any change in  voice, loss of hair, heat or cold intolerance. Energy level has been adequate to good. Patient denies constipation and diarrhea. No myxedema. Medication is as noted below. Verified that pt is taking it daily on an empty stomach. Well tolerated.  History [name] has a past medical history of Acute blood loss anemia, Acute pulmonary edema ([place]), CAD (coronary artery disease), native coronary artery, Carotid artery occlusion, Chronic kidney disease, Diabetes mellitus without complication ([place]), Hypertension, NSTEMI (non-ST elevated myocardial infarction) ([place]) (07/25/2016), and S/P CABG (coronary artery  bypass graft).   He has a past surgical history that includes Lumbar fusion (2005); LEFT HEART CATH AND CORONARY ANGIOGRAPHY (N/A, 07/28/2016); Coronary artery bypass graft (N/A, 08/05/2016); TEE without cardioversion (N/A, 08/05/2016); Joint replacement (Bilateral); Appendectomy; and Tonsilectomy, adenoidectomy, bilateral myringotomy and tubes.   His family history includes Arthritis in his father, maternal grandfather, maternal grandmother, mother, paternal grandfather, and paternal grandmother; Asthma in his sister; Cancer in his brother, brother, father, and mother; Diabetes in his brother, father, and mother; Heart attack in his son; Heart disease in his mother; Hyperlipidemia in his brother, brother, brother, father, mother, sister, and sister; Hypertension in his brother, brother, brother, father, mother, sister, and sister; Kidney disease in his mother; Vision loss in his sister.He reports that he has quit smoking. He has never used smokeless tobacco. He reports that he does not drink alcohol or use drugs.  Current Outpatient Medications on File Prior to Visit  Medication Sig Dispense Refill  . acetaminophen (TYLENOL) 325 MG tablet Take 2 tablets (650 mg total) by mouth every 6 (six) hours as needed for mild pain.    Marland Kitchen aspirin 81 MG EC tablet Take 81 mg by mouth daily. Swallow whole.    . benzonatate (TESSALON) 200 MG capsule Take 1 capsule (200 mg total) by mouth 3 (three) times daily as needed for cough. 20 capsule 0  . carvedilol (COREG) 25 MG tablet TAKE 1 & 1/2 TABLET BY MOUTH TWICE A DAY 270 tablet 3  . cloNIDine (CATAPRES) 0.1 MG tablet TAKE 2 TABLETS BY MOUTH THREE TIMES A DAY 540 tablet 3  . ferrous sulfate 325 (65 FE) MG tablet Take 1 tablet (325 mg total) by mouth daily with  breakfast.  3  . finasteride (PROSCAR) 5 MG tablet Take 5 mg by mouth daily.  3  . fluticasone (FLONASE) 50 MCG/ACT nasal spray USE 2 SPRAYS IN EACH NOSTRIL AT BEDTIME 16 g 5  . furosemide (LASIX) 40 MG tablet  Take 1 tablet (40 mg total) by mouth 2 (two) times daily. Take one when you first get out of bed. The next one four hours later. 60 tablet 5  . gabapentin (NEURONTIN) 100 MG capsule Take 100 mg by mouth 3 (three) times daily.  3  . hydrALAZINE (APRESOLINE) 100 MG tablet Take 1 tablet (100 mg total) by mouth 4 (four) times daily -  before meals and at bedtime. 360 tablet 3  . Insulin Glargine (LANTUS SOLOSTAR) 100 UNIT/ML Solostar Pen Inject 12 Units into the skin daily. At 7 am 5 pen PRN  . isosorbide dinitrate (ISORDIL) 20 MG tablet TAKE 2 TABLETS (40 MG TOTAL) BY MOUTH 3 (THREE) TIMES DAILY. 540 tablet 1  . tamsulosin (FLOMAX) 0.4 MG CAPS capsule Take 0.8 mg by mouth daily.  11   No current facility-administered medications on file prior to visit.     ROS Review of Systems  Constitutional: Negative.   HENT: Negative.   Eyes: Negative for visual disturbance.  Respiratory: Negative for cough and shortness of breath.   Cardiovascular: Negative for chest pain and leg swelling.  Gastrointestinal: Negative for abdominal pain, diarrhea, nausea and vomiting.  Genitourinary: Negative for difficulty urinating.  Musculoskeletal: Negative for arthralgias and myalgias.  Skin: Negative for rash.  Neurological: Negative for headaches.  Psychiatric/Behavioral: Negative for sleep disturbance.    Objective:  BP (!) 150/55   Pulse 68   Temp (!) 97.4 F (36.3 C) (Oral)   Ht _0  (1.854 m)   Wt 233 lb 6 oz (105.9 kg)   BMI 30.79 kg/m   BP Readings from Last 3 Encounters:  02/19/18 (!) 150/55  02/15/18 (!) 150/60  01/23/18 139/62    Wt Readings from Last 3 Encounters:  02/19/18 233 lb 6 oz (105.9 kg)  02/15/18 226 lb 12.8 oz (102.9 kg)  01/23/18 238 lb (108 kg)     Physical Exam Constitutional:      General: He is not in acute distress.    Appearance: He is well-developed.  HENT:     Head: Normocephalic and atraumatic.     Right Ear: External ear normal.     Left Ear: External ear  normal.     Nose: Nose normal.  Eyes:     Conjunctiva/sclera: Conjunctivae normal.     Pupils: Pupils are equal, round, and reactive to light.  Neck:     Musculoskeletal: Normal range of motion and neck supple.  Cardiovascular:     Rate and Rhythm: Normal rate and regular rhythm.     Heart sounds: Normal heart sounds. No murmur.  Pulmonary:     Effort: Pulmonary effort is normal. No respiratory distress.     Breath sounds: Normal breath sounds. No wheezing or rales.  Abdominal:     Palpations: Abdomen is soft.     Tenderness: There is no abdominal tenderness.  Musculoskeletal: Normal range of motion.     Right lower leg: Edema present.     Left lower leg: No edema.  Skin:    General: Skin is warm and dry.  Neurological:     Mental Status: He is alert and oriented to person, place, and time.     Deep Tendon Reflexes: Reflexes are normal  and symmetric.  Psychiatric:        Behavior: Behavior normal.        Thought Content: Thought content normal.        Judgment: Judgment normal.     Diabetic Foot Exam - Simple   No data filed        Assessment & Plan:   [name] was seen today for medical management of chronic issues.  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 -     alprostadil (EDEX) 40 MCG injection; 40 mcg by Intracavitary route as needed for erectile dysfunction. use no more than 3 times per week -     AMITIZA 24 MCG capsule; Take 1 capsule (24 mcg total) by mouth 2 (two) times daily. -     amLODipine (NORVASC) 10 MG tablet; Take 1 tablet (10 mg total) by mouth daily. -     betamethasone  valerate (VALISONE) 0.1 % cream; Apply 1 application topically daily. -     doxazosin (CARDURA) 4 MG tablet; Take 1 tablet (4 mg total) by mouth 2 (two) times daily. -     levothyroxine (SYNTHROID, LEVOTHROID) 100 MCG tablet; Take 1 tablet (100 mcg total) by mouth daily. -     rosuvastatin (CRESTOR) 20 MG tablet; Take 1 tablet (20 mg total) by mouth every evening. -     sodium bicarbonate 650 MG tablet; Take 1 tablet (650 mg total) by mouth 2 (two) times daily.   I am having Jeff L. Ryant Jr. maintain his acetaminophen, ferrous sulfate, carvedilol, finasteride, gabapentin, hydrALAZINE, aspirin, tamsulosin, cloNIDine, isosorbide dinitrate, Insulin Glargine, furosemide, benzonatate, fluticasone, allopurinol, alprostadil, AMITIZA, amLODipine, betamethasone valerate, doxazosin, levothyroxine, rosuvastatin, and sodium bicarbonate.  Meds ordered this encounter  Medications  . allopurinol (ZYLOPRIM) 100 MG tablet    Sig: Take 1 tablet (100 mg total) by mouth daily.    Dispense:  90 tablet    Refill:  1  . alprostadil (EDEX) 40 MCG injection    Sig: 40 mcg by Intracavitary route as needed for erectile dysfunction. use no more than 3 times per week    Dispense:  1 each    Refill:  12  . AMITIZA 24 MCG capsule    Sig: Take 1 capsule (24 mcg total) by mouth 2 (two) times daily.    Dispense:  180 capsule    Refill:  1  . amLODipine (NORVASC) 10 MG tablet    Sig: Take 1 tablet (10 mg total) by mouth daily.    Dispense:  90 tablet    Refill:  1  . betamethasone valerate (VALISONE) 0.1 % cream    Sig: Apply 1 application topically daily.    Dispense:  30 g    Refill:  99  . doxazosin (CARDURA) 4 MG tablet    Sig: Take 1 tablet (4 mg total) by mouth 2 (two) times daily.    Dispense:  180 tablet    Refill:  1  . levothyroxine (SYNTHROID, LEVOTHROID) 100 MCG tablet  Sig: Take 1 tablet (100 mcg total) by mouth daily.    Dispense:  90 tablet    Refill:  1  . rosuvastatin (CRESTOR) 20 MG tablet      Sig: Take 1 tablet (20 mg total) by mouth every evening.    Dispense:  90 tablet    Refill:  1  . sodium bicarbonate 650 MG tablet    Sig: Take 1 tablet (650 mg total) by mouth 2 (two) times daily.    Dispense:  180 tablet    Refill:  1     Follow-up: Return in about 3 months (around 05/21/2018).  Claretta Fraise, M.D.

## 2018-02-20 LAB — CMP14+EGFR
ALT: 9 IU/L (ref 0–44)
AST: 10 IU/L (ref 0–40)
Albumin/Globulin Ratio: 1.2 (ref 1.2–2.2)
Albumin: 3.8 g/dL (ref 3.5–4.8)
Alkaline Phosphatase: 82 IU/L (ref 39–117)
BILIRUBIN TOTAL: 0.5 mg/dL (ref 0.0–1.2)
BUN/Creatinine Ratio: 16 (ref 10–24)
BUN: 69 mg/dL — AB (ref 8–27)
CHLORIDE: 103 mmol/L (ref 96–106)
CO2: 16 mmol/L — ABNORMAL LOW (ref 20–29)
Calcium: 9.1 mg/dL (ref 8.6–10.2)
Creatinine, Ser: 4.34 mg/dL (ref 0.76–1.27)
GFR calc Af Amer: 15 mL/min/{1.73_m2} — ABNORMAL LOW (ref >59)
GFR calc non Af Amer: 13 mL/min/{1.73_m2} — ABNORMAL LOW (ref >59)
Globulin, Total: 3.1 g/dL (ref 1.5–4.5)
Glucose: 255 mg/dL — ABNORMAL HIGH (ref 65–99)
Potassium: 3.9 mmol/L (ref 3.5–5.2)
Sodium: 136 mmol/L (ref 134–144)
Total Protein: 6.9 g/dL (ref 6.0–8.5)

## 2018-02-20 LAB — CBC WITH DIFFERENTIAL/PLATELET
Basophils Absolute: 0 10*3/uL (ref 0.0–0.2)
Basos: 1 %
EOS (ABSOLUTE): 0.2 10*3/uL (ref 0.0–0.4)
Eos: 3 %
Hematocrit: 26.2 % — ABNORMAL LOW (ref 37.5–51.0)
Hemoglobin: 8.5 g/dL — CL (ref 13.0–17.7)
IMMATURE GRANS (ABS): 0 10*3/uL (ref 0.0–0.1)
Immature Granulocytes: 0 %
LYMPHS: 11 %
Lymphocytes Absolute: 0.6 10*3/uL — ABNORMAL LOW (ref 0.7–3.1)
MCH: 30.6 pg (ref 26.6–33.0)
MCHC: 32.4 g/dL (ref 31.5–35.7)
MCV: 94 fL (ref 79–97)
Monocytes Absolute: 0.5 10*3/uL (ref 0.1–0.9)
Monocytes: 9 %
Neutrophils Absolute: 4 10*3/uL (ref 1.4–7.0)
Neutrophils: 76 %
Platelets: 114 10*3/uL — ABNORMAL LOW (ref 150–450)
RBC: 2.78 x10E6/uL — ABNORMAL LOW (ref 4.14–5.80)
RDW: 14.9 % (ref 11.6–15.4)
WBC: 5.3 10*3/uL (ref 3.4–10.8)

## 2018-02-22 DIAGNOSIS — R809 Proteinuria, unspecified: Secondary | ICD-10-CM | POA: Diagnosis not present

## 2018-02-22 DIAGNOSIS — E559 Vitamin D deficiency, unspecified: Secondary | ICD-10-CM | POA: Diagnosis not present

## 2018-02-22 DIAGNOSIS — D631 Anemia in chronic kidney disease: Secondary | ICD-10-CM | POA: Diagnosis not present

## 2018-02-22 DIAGNOSIS — D509 Iron deficiency anemia, unspecified: Secondary | ICD-10-CM | POA: Diagnosis not present

## 2018-02-22 DIAGNOSIS — Z79899 Other long term (current) drug therapy: Secondary | ICD-10-CM | POA: Diagnosis not present

## 2018-02-22 DIAGNOSIS — N185 Chronic kidney disease, stage 5: Secondary | ICD-10-CM | POA: Diagnosis not present

## 2018-02-26 ENCOUNTER — Other Ambulatory Visit: Payer: Self-pay | Admitting: Family Medicine

## 2018-03-01 ENCOUNTER — Other Ambulatory Visit: Payer: Self-pay | Admitting: Internal Medicine

## 2018-03-01 DIAGNOSIS — D631 Anemia in chronic kidney disease: Secondary | ICD-10-CM | POA: Diagnosis not present

## 2018-03-01 DIAGNOSIS — D509 Iron deficiency anemia, unspecified: Secondary | ICD-10-CM | POA: Diagnosis not present

## 2018-03-01 DIAGNOSIS — R809 Proteinuria, unspecified: Secondary | ICD-10-CM | POA: Diagnosis not present

## 2018-03-01 DIAGNOSIS — E559 Vitamin D deficiency, unspecified: Secondary | ICD-10-CM | POA: Diagnosis not present

## 2018-03-01 DIAGNOSIS — Z79899 Other long term (current) drug therapy: Secondary | ICD-10-CM | POA: Diagnosis not present

## 2018-03-01 DIAGNOSIS — N185 Chronic kidney disease, stage 5: Secondary | ICD-10-CM | POA: Diagnosis not present

## 2018-03-02 ENCOUNTER — Ambulatory Visit (HOSPITAL_COMMUNITY)
Admission: RE | Admit: 2018-03-02 | Discharge: 2018-03-02 | Disposition: A | Discharge status: Home / Self Care | Payer: Medicare Other | Source: Ambulatory Visit | Attending: Family Medicine | Admitting: Family Medicine

## 2018-03-02 DIAGNOSIS — R600 Localized edema: Secondary | ICD-10-CM

## 2018-03-02 DIAGNOSIS — N184 Chronic kidney disease, stage 4 (severe): Secondary | ICD-10-CM | POA: Insufficient documentation

## 2018-03-02 DIAGNOSIS — E1022 Type 1 diabetes mellitus with diabetic chronic kidney disease: Secondary | ICD-10-CM | POA: Diagnosis not present

## 2018-03-02 NOTE — Progress Notes (Signed)
Right lower extremity venous duplex has been completed. Negative for DVT. Results were given to Dr. Livia Snellen.  03/02/18 9:09 AM [name] RVT

## 2018-03-06 DIAGNOSIS — N184 Chronic kidney disease, stage 4 (severe): Secondary | ICD-10-CM | POA: Diagnosis not present

## 2018-03-06 DIAGNOSIS — E1129 Type 2 diabetes mellitus with other diabetic kidney complication: Secondary | ICD-10-CM | POA: Diagnosis not present

## 2018-03-06 DIAGNOSIS — I10 Essential (primary) hypertension: Secondary | ICD-10-CM | POA: Diagnosis not present

## 2018-03-06 DIAGNOSIS — E8889 Other specified metabolic disorders: Secondary | ICD-10-CM | POA: Diagnosis not present

## 2018-03-06 DIAGNOSIS — D638 Anemia in other chronic diseases classified elsewhere: Secondary | ICD-10-CM | POA: Diagnosis not present

## 2018-03-08 DIAGNOSIS — R809 Proteinuria, unspecified: Secondary | ICD-10-CM | POA: Diagnosis not present

## 2018-03-08 DIAGNOSIS — N185 Chronic kidney disease, stage 5: Secondary | ICD-10-CM | POA: Diagnosis not present

## 2018-03-08 DIAGNOSIS — D509 Iron deficiency anemia, unspecified: Secondary | ICD-10-CM | POA: Diagnosis not present

## 2018-03-08 DIAGNOSIS — E559 Vitamin D deficiency, unspecified: Secondary | ICD-10-CM | POA: Diagnosis not present

## 2018-03-08 DIAGNOSIS — D631 Anemia in chronic kidney disease: Secondary | ICD-10-CM | POA: Diagnosis not present

## 2018-03-08 DIAGNOSIS — Z79899 Other long term (current) drug therapy: Secondary | ICD-10-CM | POA: Diagnosis not present

## 2018-03-15 DIAGNOSIS — R809 Proteinuria, unspecified: Secondary | ICD-10-CM | POA: Diagnosis not present

## 2018-03-15 DIAGNOSIS — E559 Vitamin D deficiency, unspecified: Secondary | ICD-10-CM | POA: Diagnosis not present

## 2018-03-15 DIAGNOSIS — Z79899 Other long term (current) drug therapy: Secondary | ICD-10-CM | POA: Diagnosis not present

## 2018-03-15 DIAGNOSIS — D631 Anemia in chronic kidney disease: Secondary | ICD-10-CM | POA: Diagnosis not present

## 2018-03-15 DIAGNOSIS — N185 Chronic kidney disease, stage 5: Secondary | ICD-10-CM | POA: Diagnosis not present

## 2018-03-15 DIAGNOSIS — D509 Iron deficiency anemia, unspecified: Secondary | ICD-10-CM | POA: Diagnosis not present

## 2018-03-22 DIAGNOSIS — Z79899 Other long term (current) drug therapy: Secondary | ICD-10-CM | POA: Diagnosis not present

## 2018-03-22 DIAGNOSIS — E559 Vitamin D deficiency, unspecified: Secondary | ICD-10-CM | POA: Diagnosis not present

## 2018-03-22 DIAGNOSIS — R809 Proteinuria, unspecified: Secondary | ICD-10-CM | POA: Diagnosis not present

## 2018-03-22 DIAGNOSIS — N184 Chronic kidney disease, stage 4 (severe): Secondary | ICD-10-CM | POA: Diagnosis not present

## 2018-03-22 DIAGNOSIS — D631 Anemia in chronic kidney disease: Secondary | ICD-10-CM | POA: Diagnosis not present

## 2018-03-22 DIAGNOSIS — D509 Iron deficiency anemia, unspecified: Secondary | ICD-10-CM | POA: Diagnosis not present

## 2018-03-28 ENCOUNTER — Other Ambulatory Visit: Payer: Self-pay | Admitting: *Deleted

## 2018-03-28 MED ORDER — GLUCOSE BLOOD VI STRP: ORAL_STRIP | 12 refills | Status: DC

## 2018-03-30 DIAGNOSIS — E559 Vitamin D deficiency, unspecified: Secondary | ICD-10-CM | POA: Diagnosis not present

## 2018-03-30 DIAGNOSIS — D631 Anemia in chronic kidney disease: Secondary | ICD-10-CM | POA: Diagnosis not present

## 2018-03-30 DIAGNOSIS — D509 Iron deficiency anemia, unspecified: Secondary | ICD-10-CM | POA: Diagnosis not present

## 2018-03-30 DIAGNOSIS — Z79899 Other long term (current) drug therapy: Secondary | ICD-10-CM | POA: Diagnosis not present

## 2018-03-30 DIAGNOSIS — R809 Proteinuria, unspecified: Secondary | ICD-10-CM | POA: Diagnosis not present

## 2018-03-30 DIAGNOSIS — N184 Chronic kidney disease, stage 4 (severe): Secondary | ICD-10-CM | POA: Diagnosis not present

## 2018-04-05 DIAGNOSIS — E559 Vitamin D deficiency, unspecified: Secondary | ICD-10-CM | POA: Diagnosis not present

## 2018-04-05 DIAGNOSIS — R809 Proteinuria, unspecified: Secondary | ICD-10-CM | POA: Diagnosis not present

## 2018-04-05 DIAGNOSIS — D631 Anemia in chronic kidney disease: Secondary | ICD-10-CM | POA: Diagnosis not present

## 2018-04-05 DIAGNOSIS — Z79899 Other long term (current) drug therapy: Secondary | ICD-10-CM | POA: Diagnosis not present

## 2018-04-05 DIAGNOSIS — D509 Iron deficiency anemia, unspecified: Secondary | ICD-10-CM | POA: Diagnosis not present

## 2018-04-05 DIAGNOSIS — N184 Chronic kidney disease, stage 4 (severe): Secondary | ICD-10-CM | POA: Diagnosis not present

## 2018-04-12 DIAGNOSIS — E559 Vitamin D deficiency, unspecified: Secondary | ICD-10-CM | POA: Diagnosis not present

## 2018-04-12 DIAGNOSIS — R809 Proteinuria, unspecified: Secondary | ICD-10-CM | POA: Diagnosis not present

## 2018-04-12 DIAGNOSIS — D631 Anemia in chronic kidney disease: Secondary | ICD-10-CM | POA: Diagnosis not present

## 2018-04-12 DIAGNOSIS — D509 Iron deficiency anemia, unspecified: Secondary | ICD-10-CM | POA: Diagnosis not present

## 2018-04-12 DIAGNOSIS — Z79899 Other long term (current) drug therapy: Secondary | ICD-10-CM | POA: Diagnosis not present

## 2018-04-12 DIAGNOSIS — N184 Chronic kidney disease, stage 4 (severe): Secondary | ICD-10-CM | POA: Diagnosis not present

## 2018-04-19 DIAGNOSIS — N184 Chronic kidney disease, stage 4 (severe): Secondary | ICD-10-CM | POA: Diagnosis not present

## 2018-04-19 DIAGNOSIS — R809 Proteinuria, unspecified: Secondary | ICD-10-CM | POA: Diagnosis not present

## 2018-04-19 DIAGNOSIS — E559 Vitamin D deficiency, unspecified: Secondary | ICD-10-CM | POA: Diagnosis not present

## 2018-04-19 DIAGNOSIS — D509 Iron deficiency anemia, unspecified: Secondary | ICD-10-CM | POA: Diagnosis not present

## 2018-04-19 DIAGNOSIS — Z79899 Other long term (current) drug therapy: Secondary | ICD-10-CM | POA: Diagnosis not present

## 2018-04-26 DIAGNOSIS — R809 Proteinuria, unspecified: Secondary | ICD-10-CM | POA: Diagnosis not present

## 2018-04-26 DIAGNOSIS — Z79899 Other long term (current) drug therapy: Secondary | ICD-10-CM | POA: Diagnosis not present

## 2018-04-26 DIAGNOSIS — N189 Chronic kidney disease, unspecified: Secondary | ICD-10-CM | POA: Diagnosis not present

## 2018-04-26 DIAGNOSIS — D509 Iron deficiency anemia, unspecified: Secondary | ICD-10-CM | POA: Diagnosis not present

## 2018-04-26 DIAGNOSIS — E559 Vitamin D deficiency, unspecified: Secondary | ICD-10-CM | POA: Diagnosis not present

## 2018-04-26 DIAGNOSIS — N184 Chronic kidney disease, stage 4 (severe): Secondary | ICD-10-CM | POA: Diagnosis not present

## 2018-04-27 DIAGNOSIS — Z79899 Other long term (current) drug therapy: Secondary | ICD-10-CM | POA: Diagnosis not present

## 2018-04-27 DIAGNOSIS — N184 Chronic kidney disease, stage 4 (severe): Secondary | ICD-10-CM | POA: Diagnosis not present

## 2018-04-27 DIAGNOSIS — D509 Iron deficiency anemia, unspecified: Secondary | ICD-10-CM | POA: Diagnosis not present

## 2018-04-27 DIAGNOSIS — N189 Chronic kidney disease, unspecified: Secondary | ICD-10-CM | POA: Diagnosis not present

## 2018-04-27 DIAGNOSIS — R809 Proteinuria, unspecified: Secondary | ICD-10-CM | POA: Diagnosis not present

## 2018-04-27 DIAGNOSIS — E559 Vitamin D deficiency, unspecified: Secondary | ICD-10-CM | POA: Diagnosis not present

## 2018-05-01 LAB — HM DIABETES EYE EXAM

## 2018-05-03 DIAGNOSIS — Z79899 Other long term (current) drug therapy: Secondary | ICD-10-CM | POA: Diagnosis not present

## 2018-05-03 DIAGNOSIS — N184 Chronic kidney disease, stage 4 (severe): Secondary | ICD-10-CM | POA: Diagnosis not present

## 2018-05-03 DIAGNOSIS — R809 Proteinuria, unspecified: Secondary | ICD-10-CM | POA: Diagnosis not present

## 2018-05-03 DIAGNOSIS — E559 Vitamin D deficiency, unspecified: Secondary | ICD-10-CM | POA: Diagnosis not present

## 2018-05-03 DIAGNOSIS — D509 Iron deficiency anemia, unspecified: Secondary | ICD-10-CM | POA: Diagnosis not present

## 2018-05-10 DIAGNOSIS — Z79899 Other long term (current) drug therapy: Secondary | ICD-10-CM | POA: Diagnosis not present

## 2018-05-10 DIAGNOSIS — E559 Vitamin D deficiency, unspecified: Secondary | ICD-10-CM | POA: Diagnosis not present

## 2018-05-10 DIAGNOSIS — N184 Chronic kidney disease, stage 4 (severe): Secondary | ICD-10-CM | POA: Diagnosis not present

## 2018-05-10 DIAGNOSIS — R809 Proteinuria, unspecified: Secondary | ICD-10-CM | POA: Diagnosis not present

## 2018-05-10 DIAGNOSIS — D509 Iron deficiency anemia, unspecified: Secondary | ICD-10-CM | POA: Diagnosis not present

## 2018-05-11 ENCOUNTER — Other Ambulatory Visit: Payer: Self-pay | Admitting: Internal Medicine

## 2018-05-17 DIAGNOSIS — E559 Vitamin D deficiency, unspecified: Secondary | ICD-10-CM | POA: Diagnosis not present

## 2018-05-17 DIAGNOSIS — Z79899 Other long term (current) drug therapy: Secondary | ICD-10-CM | POA: Diagnosis not present

## 2018-05-17 DIAGNOSIS — R809 Proteinuria, unspecified: Secondary | ICD-10-CM | POA: Diagnosis not present

## 2018-05-17 DIAGNOSIS — N184 Chronic kidney disease, stage 4 (severe): Secondary | ICD-10-CM | POA: Diagnosis not present

## 2018-05-17 DIAGNOSIS — D509 Iron deficiency anemia, unspecified: Secondary | ICD-10-CM | POA: Diagnosis not present

## 2018-05-22 DIAGNOSIS — N184 Chronic kidney disease, stage 4 (severe): Secondary | ICD-10-CM | POA: Diagnosis not present

## 2018-05-22 DIAGNOSIS — E1129 Type 2 diabetes mellitus with other diabetic kidney complication: Secondary | ICD-10-CM | POA: Diagnosis not present

## 2018-05-22 DIAGNOSIS — D638 Anemia in other chronic diseases classified elsewhere: Secondary | ICD-10-CM | POA: Diagnosis not present

## 2018-05-22 DIAGNOSIS — E8889 Other specified metabolic disorders: Secondary | ICD-10-CM | POA: Diagnosis not present

## 2018-05-24 DIAGNOSIS — N184 Chronic kidney disease, stage 4 (severe): Secondary | ICD-10-CM | POA: Diagnosis not present

## 2018-05-24 DIAGNOSIS — E559 Vitamin D deficiency, unspecified: Secondary | ICD-10-CM | POA: Diagnosis not present

## 2018-05-24 DIAGNOSIS — Z79899 Other long term (current) drug therapy: Secondary | ICD-10-CM | POA: Diagnosis not present

## 2018-05-24 DIAGNOSIS — R809 Proteinuria, unspecified: Secondary | ICD-10-CM | POA: Diagnosis not present

## 2018-05-24 DIAGNOSIS — D509 Iron deficiency anemia, unspecified: Secondary | ICD-10-CM | POA: Diagnosis not present

## 2018-05-29 ENCOUNTER — Other Ambulatory Visit: Payer: Self-pay

## 2018-05-29 ENCOUNTER — Ambulatory Visit (INDEPENDENT_AMBULATORY_CARE_PROVIDER_SITE_OTHER): Payer: Medicare Other | Admitting: Family Medicine

## 2018-05-29 ENCOUNTER — Encounter: Payer: Self-pay | Admitting: Family Medicine

## 2018-05-29 DIAGNOSIS — E1022 Type 1 diabetes mellitus with diabetic chronic kidney disease: Secondary | ICD-10-CM | POA: Diagnosis not present

## 2018-05-29 DIAGNOSIS — N185 Chronic kidney disease, stage 5: Secondary | ICD-10-CM

## 2018-05-29 NOTE — Progress Notes (Signed)
Subjective:  Patient ID: Jeff Ranks., male    DOB: 11-21-1945  Age: 73 y.o. MRN: [id number]  CC: No chief complaint on file.   HPI [name]. presents forFollow-up of diabetes. Patient checks blood sugar at home. Reds numbers to me from his log.  69, 70, 125 fasting and 199, 224 postprandial Patient denies symptoms such as polyuria, polydipsia, excessive hunger, nausea No significant hypoglycemic spells noted.  Medications reviewed. Pt reports taking lantus, but skips a day if his sugar is at the low end of readings above. Will then take it 12 hours laater if his sugar rebounds - which happens frequently.  History [name] has a past medical history of Acute blood loss anemia, Acute pulmonary edema ([place]), CAD (coronary artery disease), native coronary artery, Carotid artery occlusion, Chronic kidney disease, Diabetes mellitus without complication ([place]), Hypertension, NSTEMI (non-ST elevated myocardial infarction) ([place]) (07/25/2016), and S/P CABG (coronary artery bypass graft).   He has a past surgical history that includes Lumbar fusion (2005); LEFT HEART CATH AND CORONARY ANGIOGRAPHY (N/A, 07/28/2016); Coronary artery bypass graft (N/A, 08/05/2016); TEE without cardioversion (N/A, 08/05/2016); Joint replacement (Bilateral); Appendectomy; and Tonsilectomy, adenoidectomy, bilateral myringotomy and tubes.   His family history includes Arthritis in his father, maternal grandfather, maternal grandmother, mother, paternal grandfather, and paternal grandmother; Asthma in his sister; Cancer in his brother, brother, father, and mother; Diabetes in his brother, father, and mother; Heart attack in his son; Heart disease in his mother; Hyperlipidemia in his brother, brother, brother, father, mother, sister, and sister; Hypertension in his brother, brother, brother, father, mother, sister, and sister; Kidney disease in his mother; Vision loss in his sister.He reports that he has quit smoking. He has  never used smokeless tobacco. He reports that he does not drink alcohol or use drugs.  Current Outpatient Medications on File Prior to Visit  Medication Sig Dispense Refill   acetaminophen (TYLENOL) 325 MG tablet Take 2 tablets (650 mg total) by mouth every 6 (six) hours as needed for mild pain.     allopurinol (ZYLOPRIM) 100 MG tablet Take 1 tablet (100 mg total) by mouth daily. 90 tablet 1   alprostadil (EDEX) 40 MCG injection 40 mcg by Intracavitary route as needed for erectile dysfunction. use no more than 3 times per week 1 each 12   AMITIZA 24 MCG capsule Take 1 capsule (24 mcg total) by mouth 2 (two) times daily. 180 capsule 1   amLODipine (NORVASC) 10 MG tablet Take 1 tablet (10 mg total) by mouth daily. 90 tablet 1   aspirin 81 MG EC tablet Take 81 mg by mouth daily. Swallow whole.     benzonatate (TESSALON) 200 MG capsule Take 1 capsule (200 mg total) by mouth 3 (three) times daily as needed for cough. 20 capsule 0   betamethasone valerate (VALISONE) 0.1 % cream USE AS DIRECTED 45 g 2   carvedilol (COREG) 25 MG tablet TAKE 1 & 1/2 TABLET BY MOUTH TWICE A DAY 270 tablet 3   cloNIDine (CATAPRES) 0.1 MG tablet TAKE 2 TABLETS BY MOUTH THREE TIMES A DAY 540 tablet 3   doxazosin (CARDURA) 4 MG tablet Take 1 tablet (4 mg total) by mouth 2 (two) times daily. 180 tablet 1   ferrous sulfate 325 (65 FE) MG tablet Take 1 tablet (325 mg total) by mouth daily with breakfast.  3   finasteride (PROSCAR) 5 MG tablet Take 5 mg by mouth daily.  3   fluticasone (FLONASE) 50 MCG/ACT nasal  spray USE 2 SPRAYS IN EACH NOSTRIL AT BEDTIME 16 g 5   furosemide (LASIX) 40 MG tablet Take 1 tablet (40 mg total) by mouth 2 (two) times daily. Take one when you first get out of bed. The next one four hours later. 60 tablet 5   gabapentin (NEURONTIN) 100 MG capsule Take 100 mg by mouth 3 (three) times daily.  3   glucose blood (ONE TOUCH ULTRA TEST) test strip Use as instructed 100 each 12    hydrALAZINE (APRESOLINE) 100 MG tablet Take 1 tablet (100 mg total) by mouth 4 (four) times daily -  before meals and at bedtime. 360 tablet 3   Insulin Glargine (LANTUS SOLOSTAR) 100 UNIT/ML Solostar Pen Inject 12 Units into the skin daily. At 7 am 5 pen PRN   isosorbide dinitrate (ISORDIL) 20 MG tablet TAKE 2 TABLETS (40 MG TOTAL) BY MOUTH 3 (THREE) TIMES DAILY. 540 tablet 1   levothyroxine (SYNTHROID, LEVOTHROID) 100 MCG tablet Take 1 tablet (100 mcg total) by mouth daily. 90 tablet 1   rosuvastatin (CRESTOR) 20 MG tablet Take 1 tablet (20 mg total) by mouth every evening. 90 tablet 1   sodium bicarbonate 650 MG tablet Take 1 tablet (650 mg total) by mouth 2 (two) times daily. 180 tablet 1   tamsulosin (FLOMAX) 0.4 MG CAPS capsule Take 0.8 mg by mouth daily.  11   No current facility-administered medications on file prior to visit.     ROS Review of Systems  Constitutional: Negative for activity change and fever.  Respiratory: Negative for cough and shortness of breath.   Cardiovascular: Negative for chest pain, palpitations and leg swelling.  Endocrine: Negative for polyphagia and polyuria.  Genitourinary: Negative for difficulty urinating.  Musculoskeletal: Negative for arthralgias.  Skin: Negative for rash.  Psychiatric/Behavioral: Negative for decreased concentration.    Objective:  There were no vitals taken for this visit.  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 If glucose is low he should eat.  He should not delay his insulin.   I am having Jeff L. Chason Jr. maintain his acetaminophen, ferrous sulfate, finasteride, gabapentin, hydrALAZINE, aspirin, tamsulosin, cloNIDine, Insulin Glargine, furosemide, benzonatate, fluticasone, allopurinol, alprostadil, Amitiza, amLODipine, doxazosin, levothyroxine, rosuvastatin, sodium bicarbonate, betamethasone valerate, carvedilol, glucose blood, and isosorbide dinitrate.  No orders of the defined types were placed in this encounter.  Virtual Visit via telephone Note  I discussed the limitations, risks, security and privacy concerns of performing an evaluation and management service by telephone and the availability of in person appointments. I also discussed with the patient that there may be a patient responsible charge related to this service. The patient expressed understanding and agreed to proceed. Pt. Is at home. Dr. Livia Snellen is in his office.  Follow Up Instructions:   I discussed the assessment and treatment plan with the patient. The patient was provided an opportunity to ask questions and all were answered. The patient agreed with the  plan and demonstrated an understanding of the instructions.   The patient was advised to call back or seek an in-person evaluation if the symptoms worsen or if the condition fails to improve as anticipated.  Visit started: 3: 05 Call ended:  3:25 Total minutes including chart review and phone contact time: 31   Follow-up: Return in about 1 month (around 06/28/2018).  Claretta Fraise, M.D.

## 2018-05-31 DIAGNOSIS — D509 Iron deficiency anemia, unspecified: Secondary | ICD-10-CM | POA: Diagnosis not present

## 2018-05-31 DIAGNOSIS — Z79899 Other long term (current) drug therapy: Secondary | ICD-10-CM | POA: Diagnosis not present

## 2018-05-31 DIAGNOSIS — E559 Vitamin D deficiency, unspecified: Secondary | ICD-10-CM | POA: Diagnosis not present

## 2018-05-31 DIAGNOSIS — R809 Proteinuria, unspecified: Secondary | ICD-10-CM | POA: Diagnosis not present

## 2018-05-31 DIAGNOSIS — N184 Chronic kidney disease, stage 4 (severe): Secondary | ICD-10-CM | POA: Diagnosis not present

## 2018-06-07 DIAGNOSIS — R809 Proteinuria, unspecified: Secondary | ICD-10-CM | POA: Diagnosis not present

## 2018-06-07 DIAGNOSIS — E559 Vitamin D deficiency, unspecified: Secondary | ICD-10-CM | POA: Diagnosis not present

## 2018-06-07 DIAGNOSIS — Z79899 Other long term (current) drug therapy: Secondary | ICD-10-CM | POA: Diagnosis not present

## 2018-06-07 DIAGNOSIS — N184 Chronic kidney disease, stage 4 (severe): Secondary | ICD-10-CM | POA: Diagnosis not present

## 2018-06-07 DIAGNOSIS — D509 Iron deficiency anemia, unspecified: Secondary | ICD-10-CM | POA: Diagnosis not present

## 2018-06-14 DIAGNOSIS — D509 Iron deficiency anemia, unspecified: Secondary | ICD-10-CM | POA: Diagnosis not present

## 2018-06-14 DIAGNOSIS — N184 Chronic kidney disease, stage 4 (severe): Secondary | ICD-10-CM | POA: Diagnosis not present

## 2018-06-14 DIAGNOSIS — R809 Proteinuria, unspecified: Secondary | ICD-10-CM | POA: Diagnosis not present

## 2018-06-14 DIAGNOSIS — Z79899 Other long term (current) drug therapy: Secondary | ICD-10-CM | POA: Diagnosis not present

## 2018-06-14 DIAGNOSIS — E559 Vitamin D deficiency, unspecified: Secondary | ICD-10-CM | POA: Diagnosis not present

## 2018-06-21 DIAGNOSIS — D631 Anemia in chronic kidney disease: Secondary | ICD-10-CM | POA: Diagnosis not present

## 2018-06-21 DIAGNOSIS — N185 Chronic kidney disease, stage 5: Secondary | ICD-10-CM | POA: Diagnosis not present

## 2018-06-27 ENCOUNTER — Other Ambulatory Visit: Payer: Self-pay | Admitting: Family Medicine

## 2018-06-28 DIAGNOSIS — D631 Anemia in chronic kidney disease: Secondary | ICD-10-CM | POA: Diagnosis not present

## 2018-06-28 DIAGNOSIS — N185 Chronic kidney disease, stage 5: Secondary | ICD-10-CM | POA: Diagnosis not present

## 2018-06-29 ENCOUNTER — Other Ambulatory Visit: Payer: Self-pay | Admitting: Family Medicine

## 2018-07-05 DIAGNOSIS — N185 Chronic kidney disease, stage 5: Secondary | ICD-10-CM | POA: Diagnosis not present

## 2018-07-05 DIAGNOSIS — D631 Anemia in chronic kidney disease: Secondary | ICD-10-CM | POA: Diagnosis not present

## 2018-07-10 ENCOUNTER — Ambulatory Visit (INDEPENDENT_AMBULATORY_CARE_PROVIDER_SITE_OTHER): Payer: Medicare Other | Admitting: Family Medicine

## 2018-07-10 ENCOUNTER — Encounter: Payer: Self-pay | Admitting: Family Medicine

## 2018-07-10 ENCOUNTER — Other Ambulatory Visit: Payer: Self-pay

## 2018-07-10 VITALS — BP 198/70 | HR 67 | Temp 98.8°F | Ht 73.0 in | Wt 237.6 lb

## 2018-07-10 DIAGNOSIS — S80812A Abrasion, left lower leg, initial encounter: Secondary | ICD-10-CM

## 2018-07-10 MED ORDER — SILVER SULFADIAZINE 1 % EX CREA: 1.0000 "application " | TOPICAL_CREAM | Freq: Every day | CUTANEOUS | 0 refills | Status: DC

## 2018-07-10 NOTE — Progress Notes (Signed)
Chief Complaint  Patient presents with  . left leg laceration from fall    HPI  Patient presents today for laceration of the left legs after falling.  He scraped it on the ground when he fell.  He is not a good walker he says.  He loses his balance easily.  He does have a walker to use.  He was not using it at the time of the follow-up.  He has been putting some type of ointment on it.  He does not know the name of it.  PMH: Smoking status noted ROS: Per HPI  Objective: BP (!) 198/70   Pulse 67   Temp 98.8 F (37.1 C) (Oral)   Ht 6\' 1"  (1.854 m)   Wt 237 lb 9.6 oz (107.8 kg)   BMI 31.35 kg/m  Gen: NAD, alert, cooperative with exam  Ext: No edema, warm.  There is a 1 x 4 cm area of denuded epithelium with small laceration at the superior aspect.  This is found at the mid shaft of the left shin.  On palpation and superficial debridement it is noted to have some slough on the top but no purulence and no fluctuance.  No erythema.    Neuro: Alert and oriented, No gross deficits  Assessment and plan:  1. Abrasion of anterior left lower leg, initial encounter     Meds ordered this encounter  Medications  . silver sulfADIAZINE (SILVADENE) 1 % cream    Sig: Apply 1 application topically daily.    Dispense:  400 g    Refill:  0    No orders of the defined types were placed in this encounter.   Follow up as needed.  Claretta Fraise, MD

## 2018-07-12 DIAGNOSIS — D649 Anemia, unspecified: Secondary | ICD-10-CM | POA: Diagnosis not present

## 2018-07-12 DIAGNOSIS — R531 Weakness: Secondary | ICD-10-CM | POA: Diagnosis not present

## 2018-07-12 DIAGNOSIS — D631 Anemia in chronic kidney disease: Secondary | ICD-10-CM | POA: Diagnosis not present

## 2018-07-12 DIAGNOSIS — N185 Chronic kidney disease, stage 5: Secondary | ICD-10-CM | POA: Diagnosis not present

## 2018-07-13 DIAGNOSIS — D649 Anemia, unspecified: Secondary | ICD-10-CM | POA: Diagnosis not present

## 2018-07-13 DIAGNOSIS — R531 Weakness: Secondary | ICD-10-CM | POA: Diagnosis not present

## 2018-07-13 DIAGNOSIS — N185 Chronic kidney disease, stage 5: Secondary | ICD-10-CM | POA: Diagnosis not present

## 2018-07-19 ENCOUNTER — Other Ambulatory Visit: Payer: Self-pay | Admitting: Family Medicine

## 2018-07-19 DIAGNOSIS — D509 Iron deficiency anemia, unspecified: Secondary | ICD-10-CM | POA: Diagnosis not present

## 2018-07-19 DIAGNOSIS — Z79899 Other long term (current) drug therapy: Secondary | ICD-10-CM | POA: Diagnosis not present

## 2018-07-19 DIAGNOSIS — R809 Proteinuria, unspecified: Secondary | ICD-10-CM | POA: Diagnosis not present

## 2018-07-19 DIAGNOSIS — N185 Chronic kidney disease, stage 5: Secondary | ICD-10-CM | POA: Diagnosis not present

## 2018-07-19 DIAGNOSIS — D631 Anemia in chronic kidney disease: Secondary | ICD-10-CM | POA: Diagnosis not present

## 2018-07-19 DIAGNOSIS — E559 Vitamin D deficiency, unspecified: Secondary | ICD-10-CM | POA: Diagnosis not present

## 2018-07-25 ENCOUNTER — Other Ambulatory Visit: Payer: Self-pay

## 2018-07-25 ENCOUNTER — Encounter: Payer: Self-pay | Admitting: Family Medicine

## 2018-07-25 ENCOUNTER — Ambulatory Visit (INDEPENDENT_AMBULATORY_CARE_PROVIDER_SITE_OTHER): Payer: Medicare Other | Admitting: Family Medicine

## 2018-07-25 VITALS — BP 149/54 | HR 65 | Temp 97.3°F | Ht 73.0 in | Wt 231.0 lb

## 2018-07-25 DIAGNOSIS — S80812D Abrasion, left lower leg, subsequent encounter: Secondary | ICD-10-CM | POA: Diagnosis not present

## 2018-07-25 NOTE — Progress Notes (Signed)
Chief Complaint  Patient presents with  . Recheck laceration left leg    HPI  Patient presents today for laceration. Changing dressing with silvadene qod. Not painful. It is still sore.   PMH: Smoking status noted ROS: Per HPI  Objective: BP (!) 149/54   Pulse 65   Temp (!) 97.3 F (36.3 C) (Oral)   Ht 6\' 1"  (1.854 m)   Wt 231 lb (104.8 kg)   BMI 30.48 kg/m  Gen: NAD, alert, cooperative with exam Ext: No edema, warm Wound had grade 2 ulceration with good bed of granulation. No sign ofinfection (No purulence or erythema or edema)    Neuro: Alert and oriented, No gross deficits  Assessment and plan:  1. Abrasion of anterior left lower leg, subsequent encounter     Continue dressing daily with silvadene & gauze.     Follow up 1 week. Monitor for redness, swelling pain, purulence. Claretta Fraise, MD

## 2018-07-26 DIAGNOSIS — R809 Proteinuria, unspecified: Secondary | ICD-10-CM | POA: Diagnosis not present

## 2018-07-26 DIAGNOSIS — N185 Chronic kidney disease, stage 5: Secondary | ICD-10-CM | POA: Diagnosis not present

## 2018-07-26 DIAGNOSIS — Z79899 Other long term (current) drug therapy: Secondary | ICD-10-CM | POA: Diagnosis not present

## 2018-07-26 DIAGNOSIS — E559 Vitamin D deficiency, unspecified: Secondary | ICD-10-CM | POA: Diagnosis not present

## 2018-07-26 DIAGNOSIS — D509 Iron deficiency anemia, unspecified: Secondary | ICD-10-CM | POA: Diagnosis not present

## 2018-07-26 DIAGNOSIS — D631 Anemia in chronic kidney disease: Secondary | ICD-10-CM | POA: Diagnosis not present

## 2018-07-31 ENCOUNTER — Other Ambulatory Visit: Payer: Self-pay

## 2018-08-01 ENCOUNTER — Ambulatory Visit (INDEPENDENT_AMBULATORY_CARE_PROVIDER_SITE_OTHER): Payer: Medicare Other | Admitting: Family Medicine

## 2018-08-01 ENCOUNTER — Encounter: Payer: Self-pay | Admitting: Family Medicine

## 2018-08-01 VITALS — BP 148/55 | HR 63 | Temp 97.7°F | Ht 73.0 in | Wt 238.0 lb

## 2018-08-01 DIAGNOSIS — N185 Chronic kidney disease, stage 5: Secondary | ICD-10-CM | POA: Diagnosis not present

## 2018-08-01 DIAGNOSIS — S81812D Laceration without foreign body, left lower leg, subsequent encounter: Secondary | ICD-10-CM

## 2018-08-01 NOTE — Progress Notes (Signed)
Chief Complaint  Patient presents with  . lower leg abrasions    HPI  Patient presents today for recheck of left leg ulcer. Dressing daily with silvadene. Feeling much better. Denies pain.   PMH: Smoking status noted ROS: Per HPI  Objective: BP (!) 148/55   Pulse 63   Temp 97.7 F (36.5 C) (Oral)   Ht 6\' 1"  (1.854 m)   Wt 238 lb (108 kg)   BMI 31.40 kg/m  Gen: NAD, alert, cooperative with exam Ext: 2+ edema, warm. Left leg ulcer has closed over with new skin.  Neuro: Alert and oriented, No gross deficits  Assessment and plan:  1. Stage 5 chronic kidney disease ([place])   2. Laceration of left lower extremity, subsequent encounter     Continue to follow with nephrology. Dress wound daily with mupirocin or neosporin.    Follow up as needed.  Claretta Fraise, MD

## 2018-08-02 DIAGNOSIS — R809 Proteinuria, unspecified: Secondary | ICD-10-CM | POA: Diagnosis not present

## 2018-08-02 DIAGNOSIS — D509 Iron deficiency anemia, unspecified: Secondary | ICD-10-CM | POA: Diagnosis not present

## 2018-08-02 DIAGNOSIS — N185 Chronic kidney disease, stage 5: Secondary | ICD-10-CM | POA: Diagnosis not present

## 2018-08-02 DIAGNOSIS — Z79899 Other long term (current) drug therapy: Secondary | ICD-10-CM | POA: Diagnosis not present

## 2018-08-02 DIAGNOSIS — E559 Vitamin D deficiency, unspecified: Secondary | ICD-10-CM | POA: Diagnosis not present

## 2018-08-02 DIAGNOSIS — D631 Anemia in chronic kidney disease: Secondary | ICD-10-CM | POA: Diagnosis not present

## 2018-08-09 DIAGNOSIS — D631 Anemia in chronic kidney disease: Secondary | ICD-10-CM | POA: Diagnosis not present

## 2018-08-09 DIAGNOSIS — E559 Vitamin D deficiency, unspecified: Secondary | ICD-10-CM | POA: Diagnosis not present

## 2018-08-09 DIAGNOSIS — D509 Iron deficiency anemia, unspecified: Secondary | ICD-10-CM | POA: Diagnosis not present

## 2018-08-09 DIAGNOSIS — R809 Proteinuria, unspecified: Secondary | ICD-10-CM | POA: Diagnosis not present

## 2018-08-09 DIAGNOSIS — N185 Chronic kidney disease, stage 5: Secondary | ICD-10-CM | POA: Diagnosis not present

## 2018-08-09 DIAGNOSIS — Z79899 Other long term (current) drug therapy: Secondary | ICD-10-CM | POA: Diagnosis not present

## 2018-08-15 ENCOUNTER — Other Ambulatory Visit: Payer: Self-pay | Admitting: Family Medicine

## 2018-08-16 DIAGNOSIS — E559 Vitamin D deficiency, unspecified: Secondary | ICD-10-CM | POA: Diagnosis not present

## 2018-08-16 DIAGNOSIS — N185 Chronic kidney disease, stage 5: Secondary | ICD-10-CM | POA: Diagnosis not present

## 2018-08-16 DIAGNOSIS — D631 Anemia in chronic kidney disease: Secondary | ICD-10-CM | POA: Diagnosis not present

## 2018-08-16 DIAGNOSIS — Z79899 Other long term (current) drug therapy: Secondary | ICD-10-CM | POA: Diagnosis not present

## 2018-08-16 DIAGNOSIS — R809 Proteinuria, unspecified: Secondary | ICD-10-CM | POA: Diagnosis not present

## 2018-08-16 DIAGNOSIS — D509 Iron deficiency anemia, unspecified: Secondary | ICD-10-CM | POA: Diagnosis not present

## 2018-08-17 ENCOUNTER — Other Ambulatory Visit: Payer: Self-pay | Admitting: Family Medicine

## 2018-08-19 ENCOUNTER — Other Ambulatory Visit: Payer: Self-pay | Admitting: Family Medicine

## 2018-08-23 DIAGNOSIS — E559 Vitamin D deficiency, unspecified: Secondary | ICD-10-CM | POA: Diagnosis not present

## 2018-08-23 DIAGNOSIS — D509 Iron deficiency anemia, unspecified: Secondary | ICD-10-CM | POA: Diagnosis not present

## 2018-08-23 DIAGNOSIS — Z79899 Other long term (current) drug therapy: Secondary | ICD-10-CM | POA: Diagnosis not present

## 2018-08-23 DIAGNOSIS — D631 Anemia in chronic kidney disease: Secondary | ICD-10-CM | POA: Diagnosis not present

## 2018-08-23 DIAGNOSIS — R809 Proteinuria, unspecified: Secondary | ICD-10-CM | POA: Diagnosis not present

## 2018-08-23 DIAGNOSIS — N185 Chronic kidney disease, stage 5: Secondary | ICD-10-CM | POA: Diagnosis not present

## 2018-08-30 DIAGNOSIS — E559 Vitamin D deficiency, unspecified: Secondary | ICD-10-CM | POA: Diagnosis not present

## 2018-08-30 DIAGNOSIS — Z79899 Other long term (current) drug therapy: Secondary | ICD-10-CM | POA: Diagnosis not present

## 2018-08-30 DIAGNOSIS — N185 Chronic kidney disease, stage 5: Secondary | ICD-10-CM | POA: Diagnosis not present

## 2018-08-30 DIAGNOSIS — R809 Proteinuria, unspecified: Secondary | ICD-10-CM | POA: Diagnosis not present

## 2018-08-30 DIAGNOSIS — D509 Iron deficiency anemia, unspecified: Secondary | ICD-10-CM | POA: Diagnosis not present

## 2018-08-30 DIAGNOSIS — D631 Anemia in chronic kidney disease: Secondary | ICD-10-CM | POA: Diagnosis not present

## 2018-09-05 ENCOUNTER — Other Ambulatory Visit: Payer: Self-pay | Admitting: Family Medicine

## 2018-09-06 DIAGNOSIS — R809 Proteinuria, unspecified: Secondary | ICD-10-CM | POA: Diagnosis not present

## 2018-09-06 DIAGNOSIS — D631 Anemia in chronic kidney disease: Secondary | ICD-10-CM | POA: Diagnosis not present

## 2018-09-06 DIAGNOSIS — E559 Vitamin D deficiency, unspecified: Secondary | ICD-10-CM | POA: Diagnosis not present

## 2018-09-06 DIAGNOSIS — D509 Iron deficiency anemia, unspecified: Secondary | ICD-10-CM | POA: Diagnosis not present

## 2018-09-06 DIAGNOSIS — Z79899 Other long term (current) drug therapy: Secondary | ICD-10-CM | POA: Diagnosis not present

## 2018-09-06 DIAGNOSIS — N185 Chronic kidney disease, stage 5: Secondary | ICD-10-CM | POA: Diagnosis not present

## 2018-09-13 DIAGNOSIS — N185 Chronic kidney disease, stage 5: Secondary | ICD-10-CM | POA: Diagnosis not present

## 2018-09-13 DIAGNOSIS — D509 Iron deficiency anemia, unspecified: Secondary | ICD-10-CM | POA: Diagnosis not present

## 2018-09-13 DIAGNOSIS — Z79899 Other long term (current) drug therapy: Secondary | ICD-10-CM | POA: Diagnosis not present

## 2018-09-13 DIAGNOSIS — D631 Anemia in chronic kidney disease: Secondary | ICD-10-CM | POA: Diagnosis not present

## 2018-09-13 DIAGNOSIS — E559 Vitamin D deficiency, unspecified: Secondary | ICD-10-CM | POA: Diagnosis not present

## 2018-09-13 DIAGNOSIS — R809 Proteinuria, unspecified: Secondary | ICD-10-CM | POA: Diagnosis not present

## 2018-09-17 ENCOUNTER — Ambulatory Visit (INDEPENDENT_AMBULATORY_CARE_PROVIDER_SITE_OTHER): Payer: Medicare Other | Admitting: *Deleted

## 2018-09-17 ENCOUNTER — Encounter: Payer: Self-pay | Admitting: *Deleted

## 2018-09-17 DIAGNOSIS — Z0000 Encounter for general adult medical examination without abnormal findings: Secondary | ICD-10-CM | POA: Diagnosis not present

## 2018-09-17 NOTE — Progress Notes (Signed)
MEDICARE ANNUAL WELLNESS VISIT  09/17/2018  Telephone Visit Disclaimer This Medicare AWV was conducted by telephone due to national recommendations for restrictions regarding the COVID-19 Pandemic (e.g. social distancing).  I verified, using two identifiers, that I am speaking with [name]. or their authorized healthcare agent. I discussed the limitations, risks, security, and privacy concerns of performing an evaluation and management service by telephone and the potential availability of an in-person appointment in the future. The patient expressed understanding and agreed to proceed.   Subjective:  Jeff Geer. is a 73 y.o. male patient of Stacks, Cletus Gash, MD who had a Medicare Annual Wellness Visit today via telephone. [name] is Retired and lives with their spouse. he has 2 children. he reports that he is socially active and does interact with friends/family regularly. he is not physically active and enjoys watching drag racing on TV.  Patient Care Team: Claretta Fraise, MD as PCP - General (Family Medicine) Debara Pickett Nadean Corwin, MD as PCP - Cardiology (Cardiology) Debara Pickett Nadean Corwin, MD as Consulting Physician (Cardiology) Garald Balding, MD as Consulting Physician (Orthopedic Surgery) Fran Lowes, MD as Consulting Physician (Nephrology)  Advanced Directives 09/17/2018 09/07/2017 12/02/2016 07/26/2016  Does Patient Have a Medical Advance Directive? No No No No  Would patient like information on creating a medical advance directive? No - Patient declined No - Patient declined - No - Patient declined    Hospital Utilization Over the Past 12 Months: # of hospitalizations or ER visits: 0 # of surgeries: 0  Review of Systems    Patient reports that his overall health is worse compared to last year.  Patient Reported Readings (BP, Pulse, CBG, Weight, etc) none  Review of Systems: Having problems with his memory  All other systems negative.  Pain Assessment Pain  : No/denies pain     Current Medications & Allergies (verified) Allergies as of 09/17/2018      Reactions   Sulfa Antibiotics Hives      Medication List       Accurate as of September 17, 2018  8:48 AM. If you have any questions, ask your nurse or doctor.        acetaminophen 325 MG tablet Commonly known as: TYLENOL Take 2 tablets (650 mg total) by mouth every 6 (six) hours as needed for mild pain.   allopurinol 100 MG tablet Commonly known as: ZYLOPRIM TAKE 1 TABLET BY MOUTH EVERY DAY   alprostadil 40 MCG injection Commonly known as: Edex 40 mcg by Intracavitary route as needed for erectile dysfunction. use no more than 3 times per week   Amitiza 24 MCG capsule Generic drug: lubiprostone TAKE 1 CAPSULE (24 MCG TOTAL) BY MOUTH 2 (TWO) TIMES DAILY.   amLODipine 10 MG tablet Commonly known as: NORVASC Take 1 tablet (10 mg total) by mouth daily.   aspirin 81 MG EC tablet Take 81 mg by mouth daily. Swallow whole.   betamethasone valerate 0.1 % cream Commonly known as: VALISONE USE AS DIRECTED   carvedilol 25 MG tablet Commonly known as: COREG TAKE 1 & 1/2 TABLET BY MOUTH TWICE A DAY   cloNIDine 0.1 MG tablet Commonly known as: CATAPRES TAKE 2 TABLETS BY MOUTH THREE TIMES A DAY   doxazosin 4 MG tablet Commonly known as: CARDURA TAKE 1 TABLET (4 MG TOTAL) BY MOUTH 2 (TWO) TIMES DAILY.   ferrous sulfate 325 (65 FE) MG tablet Take 1 tablet (325 mg total) by mouth daily with breakfast.  finasteride 5 MG tablet Commonly known as: PROSCAR Take 5 mg by mouth daily.   fluticasone 50 MCG/ACT nasal spray Commonly known as: FLONASE USE 2 SPRAYS IN EACH NOSTRIL AT BEDTIME   furosemide 40 MG tablet Commonly known as: LASIX TAKE 1 TABLET 2 TIMES A DAY - WHEN YOU FIRST GET OUT OF BED AND TAKE 1 TABLET 4 HOURS LATER   gabapentin 100 MG capsule Commonly known as: NEURONTIN TAKE 1 CAPSULE (100 MG TOTAL) BY MOUTH THREE (3) TIMES A DAY.   glucose blood test strip  Commonly known as: ONE TOUCH ULTRA TEST Use as instructed   hydrALAZINE 100 MG tablet Commonly known as: APRESOLINE TAKE 1 TABLET (100 MG TOTAL) BY MOUTH 4 (FOUR) TIMES DAILY - BEFORE MEALS AND AT BEDTIME.   Insulin Glargine 100 UNIT/ML Solostar Pen Commonly known as: Lantus SoloStar Inject 12 Units into the skin daily. At 7 am   isosorbide dinitrate 20 MG tablet Commonly known as: ISORDIL TAKE 2 TABLETS (40 MG TOTAL) BY MOUTH 3 (THREE) TIMES DAILY.   levothyroxine 100 MCG tablet Commonly known as: SYNTHROID Take 1 tablet (100 mcg total) by mouth daily.   rosuvastatin 20 MG tablet Commonly known as: CRESTOR Take 1 tablet (20 mg total) by mouth every evening.   silver sulfADIAZINE 1 % cream Commonly known as: SILVADENE Apply 1 application topically daily.   tamsulosin 0.4 MG Caps capsule Commonly known as: FLOMAX Take 0.8 mg by mouth daily.       History (reviewed): Past Medical History:  Diagnosis Date  . Acute blood loss anemia   . Acute pulmonary edema (HCC)   . CAD (coronary artery disease), native coronary artery    s/p NSTEMI and CABG x 3 (LIMA to LAD, SVG to diagonal, SVG to dRCA) on 08/05/16  . Carotid artery occlusion   . Chronic kidney disease   . Diabetes mellitus without complication ([place])   . Hypertension   . NSTEMI (non-ST elevated myocardial infarction) ([place]) 07/25/2016  . S/P CABG (coronary artery bypass graft)    Past Surgical History:  Procedure Laterality Date  . APPENDECTOMY    . CORONARY ARTERY BYPASS GRAFT N/A 08/05/2016   Procedure: CORONARY ARTERY BYPASS GRAFTING (CABG) x 3, LIMA to LAD, SVG to DIAGONAL, SVG to OM1, SVG to DISTAL RCA, USING LEFT MAMMARY ARTERY AND RIGHT GREATER SAPHENOUS VEIN HARVESTED ENDOSCOPICALLY;  Surgeon: [name], MD;  Location: [facility];  Service: Open Heart Surgery;  Laterality: N/A;  . JOINT REPLACEMENT Bilateral    hip replacements  . LEFT HEART CATH AND CORONARY ANGIOGRAPHY N/A 07/28/2016   Procedure: Left  Heart Cath and Coronary Angiography;  Surgeon: Jettie Booze, MD;  Location: [facility];  Service: Cardiovascular;  Laterality: N/A;  . LUMBAR FUSION  2005  . TEE WITHOUT CARDIOVERSION N/A 08/05/2016   Procedure: TRANSESOPHAGEAL ECHOCARDIOGRAM (TEE);  Surgeon: [name], MD;  Location: [facility];  Service: Open Heart Surgery;  Laterality: N/A;  . TONSILECTOMY, ADENOIDECTOMY, BILATERAL MYRINGOTOMY AND TUBES     Family History  Problem Relation Age of Onset  . Heart disease Mother   . Hypertension Mother   . Arthritis Mother   . Cancer Mother   . Diabetes Mother   . Hyperlipidemia Mother   . Kidney disease Mother   . Hypertension Father   . Arthritis Father   . Cancer Father   . Diabetes Father   . Hyperlipidemia Father   . Hyperlipidemia Sister   . Hypertension Sister   .  Vision loss Sister   . Hyperlipidemia Brother   . Hypertension Brother   . Arthritis Maternal Grandmother   . Arthritis Maternal Grandfather   . Arthritis Paternal Grandmother   . Arthritis Paternal Grandfather   . Asthma Sister   . Hyperlipidemia Sister   . Hypertension Sister   . Cancer Brother   . Diabetes Brother   . Hyperlipidemia Brother   . Hypertension Brother   . Cancer Brother   . Hyperlipidemia Brother   . Hypertension Brother   . Heart attack Son        66   Social History   Socioeconomic History  . Marital status: Married    Spouse name: Anne Ng  . Number of children: 2  . Years of education: 81  . Highest education level: 11th grade  Occupational History  . Occupation: Retired    Comment: Chief Technology Officer  Social Needs  . Financial resource strain: Not very hard  . Food insecurity    Worry: Never true    Inability: Never true  . Transportation needs    Medical: No    Non-medical: No  Tobacco Use  . Smoking status: Former Smoker    Quit date: 1980    Years since quitting: 40.6  . Smokeless tobacco: Never Used  Substance and Sexual  Activity  . Alcohol use: No  . Drug use: No  . Sexual activity: Yes  Lifestyle  . Physical activity    Days per week: 0 days    Minutes per session: 0 min  . Stress: Only a little  Relationships  . Social connections    Talks on phone: More than three times a week    Gets together: More than three times a week    Attends religious service: More than 4 times per year    Active member of club or organization: Yes    Attends meetings of clubs or organizations: More than 4 times per year    Relationship status: Married  Other Topics Concern  . Not on file  Social History Narrative  . Not on file    Activities of Daily Living In your present state of health, do you have any difficulty performing the following activities: 09/17/2018  Hearing? N  Vision? Y  Comment pt says he needs new glasses but gets a yearly eye exam, had his last exam 3-4 months ago  Difficulty concentrating or making decisions? Y  Comment pt says he has more trouble remembering things "it goes in one side of my head and out the other"  Walking or climbing stairs? N  Dressing or bathing? N  Doing errands, shopping? Y  Comment he likes to have someone walk along side him when he is out running errands or at the doctors office, makes him feel safer so he doesn't fall  Preparing Food and eating ? N  Using the Toilet? N  In the past six months, have you accidently leaked urine? N  Do you have problems with loss of bowel control? N  Managing your Medications? N  Managing your Finances? N  Housekeeping or managing your Housekeeping? Y  Comment wife does most of the cooking, cleaning and house work  Some recent data might be hidden    Patient Education/ Literacy How often do you need to have someone help you when you read instructions, pamphlets, or other written materials from your doctor or pharmacy?: 1 - Never What is the last grade level you completed in  school?: 11th grade  Exercise Current Exercise Habits:  The patient does not participate in regular exercise at present, Exercise limited by: cardiac condition(s)  Diet Patient reports consuming 3 meals a day and 1 snack(s) a day Patient reports that his primary diet is: Regular Patient reports that she does have regular access to food.   Depression Screen PHQ 2/9 Scores 09/17/2018 08/01/2018 07/10/2018 02/19/2018 01/23/2018 11/14/2017 10/06/2017  PHQ - 2 Score 1 0 0 0 0 1 3  PHQ- 9 Score - - - - - 6 14     Fall Risk Fall Risk  09/17/2018 08/01/2018 07/10/2018 02/19/2018 01/23/2018  Falls in the past year? 1 1 1 1  0  Number falls in past yr: 1 0 0 1 -  Injury with Fall? 1 (No Data) 1 0 -  Comment - Abrasions on lower leg. Left leg laceration. - -  Risk Factor Category  - - - - -  Risk for fall due to : Impaired balance/gait;History of fall(s) - - - -  Follow up Falls prevention discussed - - - -  Comment discussed adequate lighting, hand rails in the bathroom, removing throw rugs - - - -     Objective:  [name]. seemed alert and oriented and he participated appropriately during our telephone visit.  Blood Pressure Weight BMI  BP Readings from Last 3 Encounters:  08/01/18 (!) 148/55  07/25/18 (!) 149/54  07/10/18 (!) 198/70   Wt Readings from Last 3 Encounters:  08/01/18 238 lb (108 kg)  07/25/18 231 lb (104.8 kg)  07/10/18 237 lb 9.6 oz (107.8 kg)   BMI Readings from Last 1 Encounters:  08/01/18 31.40 kg/m    *Unable to obtain current vital signs, weight, and BMI due to telephone visit type  Hearing/Vision  . [name] did not seem to have difficulty with hearing/understanding during the telephone conversation . Reports that he has had a formal eye exam by an eye care professional within the past year . Reports that he has not had a formal hearing evaluation within the past year *Unable to fully assess hearing and vision during telephone visit type  Cognitive Function: 6CIT Screen 09/17/2018  What Year? 0 points  What month? 0  points  What time? 0 points  Count back from 20 4 points  Months in reverse 4 points  Repeat phrase 10 points  Total Score 18   (Normal:0-7, Significant for Dysfunction: >8)  Normal Cognitive Function Screening: No: pt has an appt with his PCP 09/24/18 and I will send PCP a message so this can be addressed at his appt.   Immunization & Health Maintenance Record Immunization History  Administered Date(s) Administered  . Influenza, High Dose Seasonal PF 11/14/2017  . Influenza-Unspecified 01/03/2000, 11/23/2016, 11/14/2017  . Td 07/02/1991    Health Maintenance  Topic Date Due  . Hepatitis C Screening  Apr 20, 1945  . FOOT EXAM  01/08/1955  . COLONOSCOPY  01/08/1995  . TETANUS/TDAP  07/01/2001  . PNA vac Low Risk Adult (1 of 2 - PCV13) 01/07/2010  . HEMOGLOBIN A1C  08/20/2018  . INFLUENZA VACCINE  09/15/2018  . OPHTHALMOLOGY EXAM  05/01/2019       Assessment  This is a routine wellness examination for [name].Marland Kitchen  Health Maintenance: Due or Overdue Health Maintenance Due  Topic Date Due  . Hepatitis C Screening  [birth year]  . FOOT EXAM  01/08/1955  . COLONOSCOPY  01/08/1995  . TETANUS/TDAP  07/01/2001  . PNA  vac Low Risk Adult (1 of 2 - PCV13) 01/07/2010  . HEMOGLOBIN A1C  08/20/2018  . INFLUENZA VACCINE  09/15/2018    [name]. does not need a referral for Community Assistance: Care Management:   no Social Work:    no Prescription Assistance:  no Nutrition/Diabetes Education:  no   Plan:  Personalized Goals Goals Addressed            This Visit's Progress   . DIET - INCREASE WATER INTAKE       Try to drink 6-8 glasses of water daily.      Personalized Health Maintenance & Screening Recommendations  Pneumococcal vaccine  Td vaccine Colorectal cancer screening Shingles vaccine  Lung Cancer Screening Recommended: no (Low Dose CT Chest recommended if Age 85-80 years, 30 pack-year currently smoking OR have quit w/in past 15 years)  Hepatitis C Screening recommended: yes HIV Screening recommended: no  Advanced Directives: Written information was not prepared per patient's request.  Referrals & Orders No orders of the defined types were placed in this encounter.   Follow-up Plan . Follow-up with Claretta Fraise, MD as planned . Consider TDAP, Prevnar13 and Shingles vaccines at your next visit with your PCP . Pt declines colonoscopy but advised he could do FOBT card- pt will consider   I have personally reviewed and noted the following in the patient's chart:   . Medical and social history . Use of alcohol, tobacco or illicit drugs  . Current medications and supplements . Functional ability and status . Nutritional status . Physical activity . Advanced directives . List of other physicians . Hospitalizations, surgeries, and ER visits in previous 12 months . Vitals . Screenings to include cognitive, depression, and falls . Referrals and appointments  In addition, I have reviewed and discussed with [name]. certain preventive protocols, quality metrics, and best practice recommendations. A written personalized care plan for preventive services as well as general preventive health recommendations is available and can be mailed to the patient at his request.      Marylin Crosby, LPN  04/21/1694

## 2018-09-17 NOTE — Patient Instructions (Signed)
Preventive Care 75 Years and Older, Male Preventive care refers to lifestyle choices and visits with your health care provider that can promote health and wellness. This includes:  A yearly physical exam. This is also called an annual well check.  Regular dental and eye exams.  Immunizations.  Screening for certain conditions.  Healthy lifestyle choices, such as diet and exercise. What can I expect for my preventive care visit? Physical exam Your health care provider will check:  Height and weight. These may be used to calculate body mass index (BMI), which is a measurement that tells if you are at a healthy weight.  Heart rate and blood pressure.  Your skin for abnormal spots. Counseling Your health care provider may ask you questions about:  Alcohol, tobacco, and drug use.  Emotional well-being.  Home and relationship well-being.  Sexual activity.  Eating habits.  History of falls.  Memory and ability to understand (cognition).  Work and work Statistician. What immunizations do I need?  Influenza (flu) vaccine  This is recommended every year. Tetanus, diphtheria, and pertussis (Tdap) vaccine  You may need a Td booster every 10 years. Varicella (chickenpox) vaccine  You may need this vaccine if you have not already been vaccinated. Zoster (shingles) vaccine  You may need this after age 50. Pneumococcal conjugate (PCV13) vaccine  One dose is recommended after age 24. Pneumococcal polysaccharide (PPSV23) vaccine  One dose is recommended after age 33. Measles, mumps, and rubella (MMR) vaccine  You may need at least one dose of MMR if you were born in 1957 or later. You may also need a second dose. Meningococcal conjugate (MenACWY) vaccine  You may need this if you have certain conditions. Hepatitis A vaccine  You may need this if you have certain conditions or if you travel or work in places where you may be exposed to hepatitis A. Hepatitis B vaccine   You may need this if you have certain conditions or if you travel or work in places where you may be exposed to hepatitis B. Haemophilus influenzae type b (Hib) vaccine  You may need this if you have certain conditions. You may receive vaccines as individual doses or as more than one vaccine together in one shot (combination vaccines). Talk with your health care provider about the risks and benefits of combination vaccines. What tests do I need? Blood tests  Lipid and cholesterol levels. These may be checked every 5 years, or more frequently depending on your overall health.  Hepatitis C test.  Hepatitis B test. Screening  Lung cancer screening. You may have this screening every year starting at age 74 if you have a 30-pack-year history of smoking and currently smoke or have quit within the past 15 years.  Colorectal cancer screening. All adults should have this screening starting at age 57 and continuing until age 54. Your health care provider may recommend screening at age 47 if you are at increased risk. You will have tests every 1-10 years, depending on your results and the type of screening test.  Prostate cancer screening. Recommendations will vary depending on your family history and other risks.  Diabetes screening. This is done by checking your blood sugar (glucose) after you have not eaten for a while (fasting). You may have this done every 1-3 years.  Abdominal aortic aneurysm (AAA) screening. You may need this if you are a current or former smoker.  Sexually transmitted disease (STD) testing. Follow these instructions at home: Eating and drinking  Eat  a diet that includes fresh fruits and vegetables, whole grains, lean protein, and low-fat dairy products. Limit your intake of foods with high amounts of sugar, saturated fats, and salt.  Take vitamin and mineral supplements as recommended by your health care provider.  Do not drink alcohol if your health care provider  tells you not to drink.  If you drink alcohol: ? Limit how much you have to 0-2 drinks a day. ? Be aware of how much alcohol is in your drink. In the [place] one drink equals one 12 oz bottle of beer (355 mL), one 5 oz glass of wine (148 mL), or one 1 oz glass of hard liquor (44 mL). Lifestyle  Take daily care of your teeth and gums.  Stay active. Exercise for at least 30 minutes on 5 or more days each week.  Do not use any products that contain nicotine or tobacco, such as cigarettes, e-cigarettes, and chewing tobacco. If you need help quitting, ask your health care provider.  If you are sexually active, practice safe sex. Use a condom or other form of protection to prevent STIs (sexually transmitted infections).  Talk with your health care provider about taking a low-dose aspirin or statin. What's next?  Visit your health care provider once a year for a well check visit.  Ask your health care provider how often you should have your eyes and teeth checked.  Stay up to date on all vaccines. This information is not intended to replace advice given to you by your health care provider. Make sure you discuss any questions you have with your health care provider. Document Released: 02/27/2015 Document Revised: 01/25/2018 Document Reviewed: 01/25/2018 Elsevier Patient Education  2020 [employer]  

## 2018-09-20 DIAGNOSIS — E559 Vitamin D deficiency, unspecified: Secondary | ICD-10-CM | POA: Diagnosis not present

## 2018-09-20 DIAGNOSIS — D509 Iron deficiency anemia, unspecified: Secondary | ICD-10-CM | POA: Diagnosis not present

## 2018-09-20 DIAGNOSIS — D631 Anemia in chronic kidney disease: Secondary | ICD-10-CM | POA: Diagnosis not present

## 2018-09-20 DIAGNOSIS — R809 Proteinuria, unspecified: Secondary | ICD-10-CM | POA: Diagnosis not present

## 2018-09-20 DIAGNOSIS — Z79899 Other long term (current) drug therapy: Secondary | ICD-10-CM | POA: Diagnosis not present

## 2018-09-20 DIAGNOSIS — N185 Chronic kidney disease, stage 5: Secondary | ICD-10-CM | POA: Diagnosis not present

## 2018-09-21 ENCOUNTER — Other Ambulatory Visit: Payer: Self-pay

## 2018-09-24 ENCOUNTER — Encounter: Payer: Self-pay | Admitting: Family Medicine

## 2018-09-24 ENCOUNTER — Other Ambulatory Visit: Payer: Self-pay

## 2018-09-24 ENCOUNTER — Ambulatory Visit (INDEPENDENT_AMBULATORY_CARE_PROVIDER_SITE_OTHER): Payer: Medicare Other | Admitting: Family Medicine

## 2018-09-24 VITALS — BP 164/61 | HR 62 | Temp 99.1°F | Ht 73.0 in | Wt 229.0 lb

## 2018-09-24 DIAGNOSIS — I10 Essential (primary) hypertension: Secondary | ICD-10-CM

## 2018-09-24 DIAGNOSIS — E119 Type 2 diabetes mellitus without complications: Secondary | ICD-10-CM

## 2018-09-24 DIAGNOSIS — N185 Chronic kidney disease, stage 5: Secondary | ICD-10-CM

## 2018-09-24 DIAGNOSIS — Z125 Encounter for screening for malignant neoplasm of prostate: Secondary | ICD-10-CM

## 2018-09-24 DIAGNOSIS — E785 Hyperlipidemia, unspecified: Secondary | ICD-10-CM | POA: Diagnosis not present

## 2018-09-24 DIAGNOSIS — E039 Hypothyroidism, unspecified: Secondary | ICD-10-CM

## 2018-09-24 DIAGNOSIS — E1169 Type 2 diabetes mellitus with other specified complication: Secondary | ICD-10-CM | POA: Diagnosis not present

## 2018-09-24 LAB — URINALYSIS
Bilirubin, UA: NEGATIVE
Glucose, UA: NEGATIVE
Ketones, UA: NEGATIVE
Leukocytes,UA: NEGATIVE
Nitrite, UA: NEGATIVE
RBC, UA: NEGATIVE
Specific Gravity, UA: 1.015 (ref 1.005–1.030)
Urobilinogen, Ur: 0.2 mg/dL (ref 0.2–1.0)
pH, UA: 5.5 (ref 5.0–7.5)

## 2018-09-24 LAB — BAYER DCA HB A1C WAIVED: HB A1C (BAYER DCA - WAIVED): 9.3 % — ABNORMAL HIGH (ref <7.0)

## 2018-09-24 MED ORDER — FUROSEMIDE 40 MG PO TABS: 60.0000 mg | ORAL_TABLET | Freq: Two times a day (BID) | ORAL | 1 refills | Status: DC

## 2018-09-24 NOTE — Progress Notes (Signed)
Subjective:  Patient ID: [name].,  male    DOB: [birth year]  Age: 73 y.o.    CC: Medical Management of Chronic Issues   HPI [name]. presents for  follow-up of hypertension. Patient has no history of headache chest pain or shortness of breath or recent cough. Patient also denies symptoms of TIA such as numbness weakness lateralizing. Patient denies side effects from medication. States taking it regularly.  Patient also  in for follow-up of elevated cholesterol. Doing well without complaints on current medication. Denies side effects  including myalgia and arthralgia and nausea. Also in today for liver function testing. Currently no chest pain, shortness of breath or other cardiovascular related symptoms noted.  Follow-up of diabetes. Patient does check blood sugar at home. Readings run between 70 and 90 Patient denies symptoms such as excessive hunger or urinary frequency, excessive hunger, nausea Occasional mild hypoglycemic spells noted.Relieved by eating candy or peanut butter sandwich Medications reviewed. Pt reports taking them regularly. Pt. denies complication/adverse reaction today.    History [name] has a past medical history of Acute blood loss anemia, Acute pulmonary edema ([place]), CAD (coronary artery disease), native coronary artery, Carotid artery occlusion, Chronic kidney disease, Diabetes mellitus without complication ([place]), Hypertension, NSTEMI (non-ST elevated myocardial infarction) ([place]) (07/25/2016), and S/P CABG (coronary artery bypass graft).   He has a past surgical history that includes Lumbar fusion (2005); LEFT HEART CATH AND CORONARY ANGIOGRAPHY (N/A, 07/28/2016); Coronary artery bypass graft (N/A, 08/05/2016); TEE without cardioversion (N/A, 08/05/2016); Joint replacement (Bilateral); Appendectomy; and Tonsilectomy, adenoidectomy, bilateral myringotomy and tubes.   His family history includes Arthritis in his father, maternal grandfather, maternal  grandmother, mother, paternal grandfather, and paternal grandmother; Asthma in his sister; Cancer in his brother, brother, father, and mother; Diabetes in his brother, father, and mother; Heart attack in his son; Heart disease in his mother; Hyperlipidemia in his brother, brother, brother, father, mother, sister, and sister; Hypertension in his brother, brother, brother, father, mother, sister, and sister; Kidney disease in his mother; Vision loss in his sister.He reports that he quit smoking about 40 years ago. He has never used smokeless tobacco. He reports that he does not drink alcohol or use drugs.  Current Outpatient Medications on File Prior to Visit  Medication Sig Dispense Refill  . allopurinol (ZYLOPRIM) 100 MG tablet TAKE 1 TABLET BY MOUTH EVERY DAY 90 tablet 1  . AMITIZA 24 MCG capsule TAKE 1 CAPSULE (24 MCG TOTAL) BY MOUTH 2 (TWO) TIMES DAILY. 180 capsule 1  . amLODipine (NORVASC) 10 MG tablet Take 1 tablet (10 mg total) by mouth daily. 90 tablet 1  . aspirin 81 MG EC tablet Take 81 mg by mouth daily. Swallow whole.    . carvedilol (COREG) 25 MG tablet TAKE 1 & 1/2 TABLET BY MOUTH TWICE A DAY 270 tablet 3  . cloNIDine (CATAPRES) 0.1 MG tablet TAKE 2 TABLETS BY MOUTH THREE TIMES A DAY 540 tablet 3  . doxazosin (CARDURA) 4 MG tablet TAKE 1 TABLET (4 MG TOTAL) BY MOUTH 2 (TWO) TIMES DAILY. 180 tablet 0  . ferrous sulfate 325 (65 FE) MG tablet Take 1 tablet (325 mg total) by mouth daily with breakfast.  3  . finasteride (PROSCAR) 5 MG tablet Take 5 mg by mouth daily.  3  . fluticasone (FLONASE) 50 MCG/ACT nasal spray USE 2 SPRAYS IN EACH NOSTRIL AT BEDTIME 48 mL 1  . gabapentin (NEURONTIN) 100 MG capsule TAKE 1 CAPSULE (100 MG TOTAL)  BY MOUTH THREE (3) TIMES A DAY. 270 capsule 3  . glucose blood (ONE TOUCH ULTRA TEST) test strip Use as instructed 100 each 12  . hydrALAZINE (APRESOLINE) 100 MG tablet TAKE 1 TABLET (100 MG TOTAL) BY MOUTH 4 (FOUR) TIMES DAILY - BEFORE MEALS AND AT BEDTIME. 360  tablet 3  . Insulin Glargine (LANTUS SOLOSTAR) 100 UNIT/ML Solostar Pen Inject 12 Units into the skin daily. At 7 am 5 pen PRN  . isosorbide dinitrate (ISORDIL) 20 MG tablet TAKE 2 TABLETS (40 MG TOTAL) BY MOUTH 3 (THREE) TIMES DAILY. 540 tablet 1  . levothyroxine (SYNTHROID, LEVOTHROID) 100 MCG tablet Take 1 tablet (100 mcg total) by mouth daily. 90 tablet 1  . rosuvastatin (CRESTOR) 20 MG tablet Take 1 tablet (20 mg total) by mouth every evening. 90 tablet 1  . tamsulosin (FLOMAX) 0.4 MG CAPS capsule Take 0.8 mg by mouth daily.  11  . acetaminophen (TYLENOL) 325 MG tablet Take 2 tablets (650 mg total) by mouth every 6 (six) hours as needed for mild pain.    Marland Kitchen betamethasone valerate (VALISONE) 0.1 % cream USE AS DIRECTED (Patient not taking: Reported on 09/24/2018) 45 g 3   No current facility-administered medications on file prior to visit.     ROS Review of Systems  Constitutional: Negative.   HENT: Negative.   Eyes: Negative for visual disturbance.  Respiratory: Negative for cough and shortness of breath.   Cardiovascular: Positive for leg swelling. Negative for chest pain.  Gastrointestinal: Negative for abdominal pain, diarrhea, nausea and vomiting.  Genitourinary: Negative for difficulty urinating.  Musculoskeletal: Negative for arthralgias and myalgias.  Skin: Negative for rash.  Neurological: Negative for headaches.  Psychiatric/Behavioral: Negative for sleep disturbance.    Objective:  BP (!) 164/61   Pulse 62   Temp 99.1 F (37.3 C) (Temporal)   Ht 6' 1"  (1.854 m)   Wt 229 lb (103.9 kg)   BMI 30.21 kg/m   BP Readings from Last 3 Encounters:  09/24/18 (!) 164/61  08/01/18 (!) 148/55  07/25/18 (!) 149/54    Wt Readings from Last 3 Encounters:  09/24/18 229 lb (103.9 kg)  08/01/18 238 lb (108 kg)  07/25/18 231 lb (104.8 kg)     Physical Exam Constitutional:      General: He is not in acute distress.    Appearance: He is well-developed.  HENT:     Head:  Normocephalic and atraumatic.     Right Ear: External ear normal.     Left Ear: External ear normal.     Nose: Nose normal.  Eyes:     Conjunctiva/sclera: Conjunctivae normal.     Pupils: Pupils are equal, round, and reactive to light.  Neck:     Musculoskeletal: Normal range of motion and neck supple.  Cardiovascular:     Rate and Rhythm: Normal rate and regular rhythm.     Heart sounds: Normal heart sounds. No murmur.  Pulmonary:     Effort: Pulmonary effort is normal. No respiratory distress.     Breath sounds: Normal breath sounds. No wheezing or rales.  Abdominal:     Palpations: Abdomen is soft.     Tenderness: There is no abdominal tenderness.  Musculoskeletal: Normal range of motion.     Right lower leg: Edema present.     Left lower leg: Edema present.  Skin:    General: Skin is warm and dry.  Neurological:     Mental Status: He is alert and oriented to  person, place, and time.     Deep Tendon Reflexes: Reflexes are normal and symmetric.  Psychiatric:        Behavior: Behavior normal.        Thought Content: Thought content normal.        Judgment: Judgment normal.     Assessment & Plan:   Alekxander was seen today for medical management of chronic issues.  Diagnoses and all orders for this visit:  Stage 5 chronic kidney disease ([place]) -     CMP14+EGFR -     Urinalysis  Essential hypertension -     CBC with Differential/Platelet  Diabetes mellitus type 2 in nonobese (HCC) -     Microalbumin / creatinine urine ratio -     Bayer DCA Hb A1c Waived  Hypothyroidism, unspecified type  Hyperlipidemia associated with type 2 diabetes mellitus ([place]) -     Lipid panel  Screening for prostate cancer -     PSA, total and free  Other orders -     furosemide (LASIX) 40 MG tablet; Take 1.5 tablets (60 mg total) by mouth 2 (two) times daily.   I have discontinued Jerrick L. Paullin Jr.'s alprostadil and silver sulfADIAZINE. I have also changed his furosemide.  Additionally, I am having him maintain his acetaminophen, ferrous sulfate, finasteride, aspirin, tamsulosin, cloNIDine, Insulin Glargine, amLODipine, levothyroxine, rosuvastatin, carvedilol, glucose blood, isosorbide dinitrate, gabapentin, allopurinol, hydrALAZINE, fluticasone, betamethasone valerate, Amitiza, and doxazosin.  Meds ordered this encounter  Medications  . furosemide (LASIX) 40 MG tablet    Sig: Take 1.5 tablets (60 mg total) by mouth 2 (two) times daily.    Dispense:  270 tablet    Refill:  1     Follow-up: Return in about 6 weeks (around 11/05/2018).  Claretta Fraise, M.D.

## 2018-09-24 NOTE — Patient Instructions (Signed)
Return in 6 weeks

## 2018-09-25 LAB — CMP14+EGFR
ALT: 5 IU/L (ref 0–44)
AST: 9 IU/L (ref 0–40)
Albumin/Globulin Ratio: 1 — ABNORMAL LOW (ref 1.2–2.2)
Albumin: 3.8 g/dL (ref 3.7–4.7)
Alkaline Phosphatase: 82 IU/L (ref 39–117)
BUN/Creatinine Ratio: 17 (ref 10–24)
BUN: 76 mg/dL (ref 8–27)
Bilirubin Total: 0.3 mg/dL (ref 0.0–1.2)
CO2: 17 mmol/L — ABNORMAL LOW (ref 20–29)
Calcium: 9.3 mg/dL (ref 8.6–10.2)
Chloride: 101 mmol/L (ref 96–106)
Creatinine, Ser: 4.36 mg/dL (ref 0.76–1.27)
GFR calc Af Amer: 14 mL/min/{1.73_m2} — ABNORMAL LOW (ref >59)
GFR calc non Af Amer: 13 mL/min/{1.73_m2} — ABNORMAL LOW (ref >59)
Globulin, Total: 3.8 g/dL (ref 1.5–4.5)
Glucose: 192 mg/dL — ABNORMAL HIGH (ref 65–99)
Potassium: 4.3 mmol/L (ref 3.5–5.2)
Sodium: 135 mmol/L (ref 134–144)
Total Protein: 7.6 g/dL (ref 6.0–8.5)

## 2018-09-25 LAB — LIPID PANEL
Chol/HDL Ratio: 5 ratio (ref 0.0–5.0)
Cholesterol, Total: 134 mg/dL (ref 100–199)
HDL: 27 mg/dL — ABNORMAL LOW (ref >39)
LDL Calculated: 91 mg/dL (ref 0–99)
Triglycerides: 79 mg/dL (ref 0–149)
VLDL Cholesterol Cal: 16 mg/dL (ref 5–40)

## 2018-09-25 LAB — PSA, TOTAL AND FREE
PSA, Free Pct: >40 %
PSA, Free: 0.04 ng/mL
Prostate Specific Ag, Serum: <0.1 ng/mL (ref 0.0–4.0)

## 2018-09-25 LAB — CBC WITH DIFFERENTIAL/PLATELET
Basophils Absolute: 0 10*3/uL (ref 0.0–0.2)
Basos: 0 %
EOS (ABSOLUTE): 0.1 10*3/uL (ref 0.0–0.4)
Eos: 3 %
Hematocrit: 28.7 % — ABNORMAL LOW (ref 37.5–51.0)
Hemoglobin: 9.1 g/dL — ABNORMAL LOW (ref 13.0–17.7)
Immature Grans (Abs): 0 10*3/uL (ref 0.0–0.1)
Immature Granulocytes: 0 %
Lymphocytes Absolute: 0.7 10*3/uL (ref 0.7–3.1)
Lymphs: 13 %
MCH: 29.2 pg (ref 26.6–33.0)
MCHC: 31.7 g/dL (ref 31.5–35.7)
MCV: 92 fL (ref 79–97)
Monocytes Absolute: 0.4 10*3/uL (ref 0.1–0.9)
Monocytes: 8 %
Neutrophils Absolute: 3.7 10*3/uL (ref 1.4–7.0)
Neutrophils: 76 %
Platelets: 127 10*3/uL — ABNORMAL LOW (ref 150–450)
RBC: 3.12 x10E6/uL — ABNORMAL LOW (ref 4.14–5.80)
RDW: 14.1 % (ref 11.6–15.4)
WBC: 4.9 10*3/uL (ref 3.4–10.8)

## 2018-09-26 LAB — MICROALBUMIN / CREATININE URINE RATIO
Creatinine, Urine: 56.8 mg/dL
Microalb/Creat Ratio: 136 mg/g creat — ABNORMAL HIGH (ref 0–29)
Microalbumin, Urine: 77.4 ug/mL

## 2018-09-27 DIAGNOSIS — D631 Anemia in chronic kidney disease: Secondary | ICD-10-CM | POA: Diagnosis not present

## 2018-09-27 DIAGNOSIS — N189 Chronic kidney disease, unspecified: Secondary | ICD-10-CM | POA: Diagnosis not present

## 2018-09-27 DIAGNOSIS — D509 Iron deficiency anemia, unspecified: Secondary | ICD-10-CM | POA: Diagnosis not present

## 2018-09-27 DIAGNOSIS — R809 Proteinuria, unspecified: Secondary | ICD-10-CM | POA: Diagnosis not present

## 2018-09-27 DIAGNOSIS — Z79899 Other long term (current) drug therapy: Secondary | ICD-10-CM | POA: Diagnosis not present

## 2018-09-27 DIAGNOSIS — E559 Vitamin D deficiency, unspecified: Secondary | ICD-10-CM | POA: Diagnosis not present

## 2018-10-02 ENCOUNTER — Other Ambulatory Visit: Payer: Self-pay | Admitting: Family Medicine

## 2018-10-04 DIAGNOSIS — D631 Anemia in chronic kidney disease: Secondary | ICD-10-CM | POA: Diagnosis not present

## 2018-10-04 DIAGNOSIS — D509 Iron deficiency anemia, unspecified: Secondary | ICD-10-CM | POA: Diagnosis not present

## 2018-10-04 DIAGNOSIS — N185 Chronic kidney disease, stage 5: Secondary | ICD-10-CM | POA: Diagnosis not present

## 2018-10-04 DIAGNOSIS — Z79899 Other long term (current) drug therapy: Secondary | ICD-10-CM | POA: Diagnosis not present

## 2018-10-04 DIAGNOSIS — E559 Vitamin D deficiency, unspecified: Secondary | ICD-10-CM | POA: Diagnosis not present

## 2018-10-04 DIAGNOSIS — R809 Proteinuria, unspecified: Secondary | ICD-10-CM | POA: Diagnosis not present

## 2018-10-06 IMAGING — CR DG CHEST 1V PORT
1 series · 1 of 1 positions shown · non-contrast
Comparison: Portable chest x-ray July 26, 2016

CLINICAL DATA: Shortness of breath, respiratory failure. History of
hypertension, diabetes, CHF, coronary artery disease, chronic renal
insufficiency.

EXAM:
PORTABLE CHEST 1 VIEW

[AP]
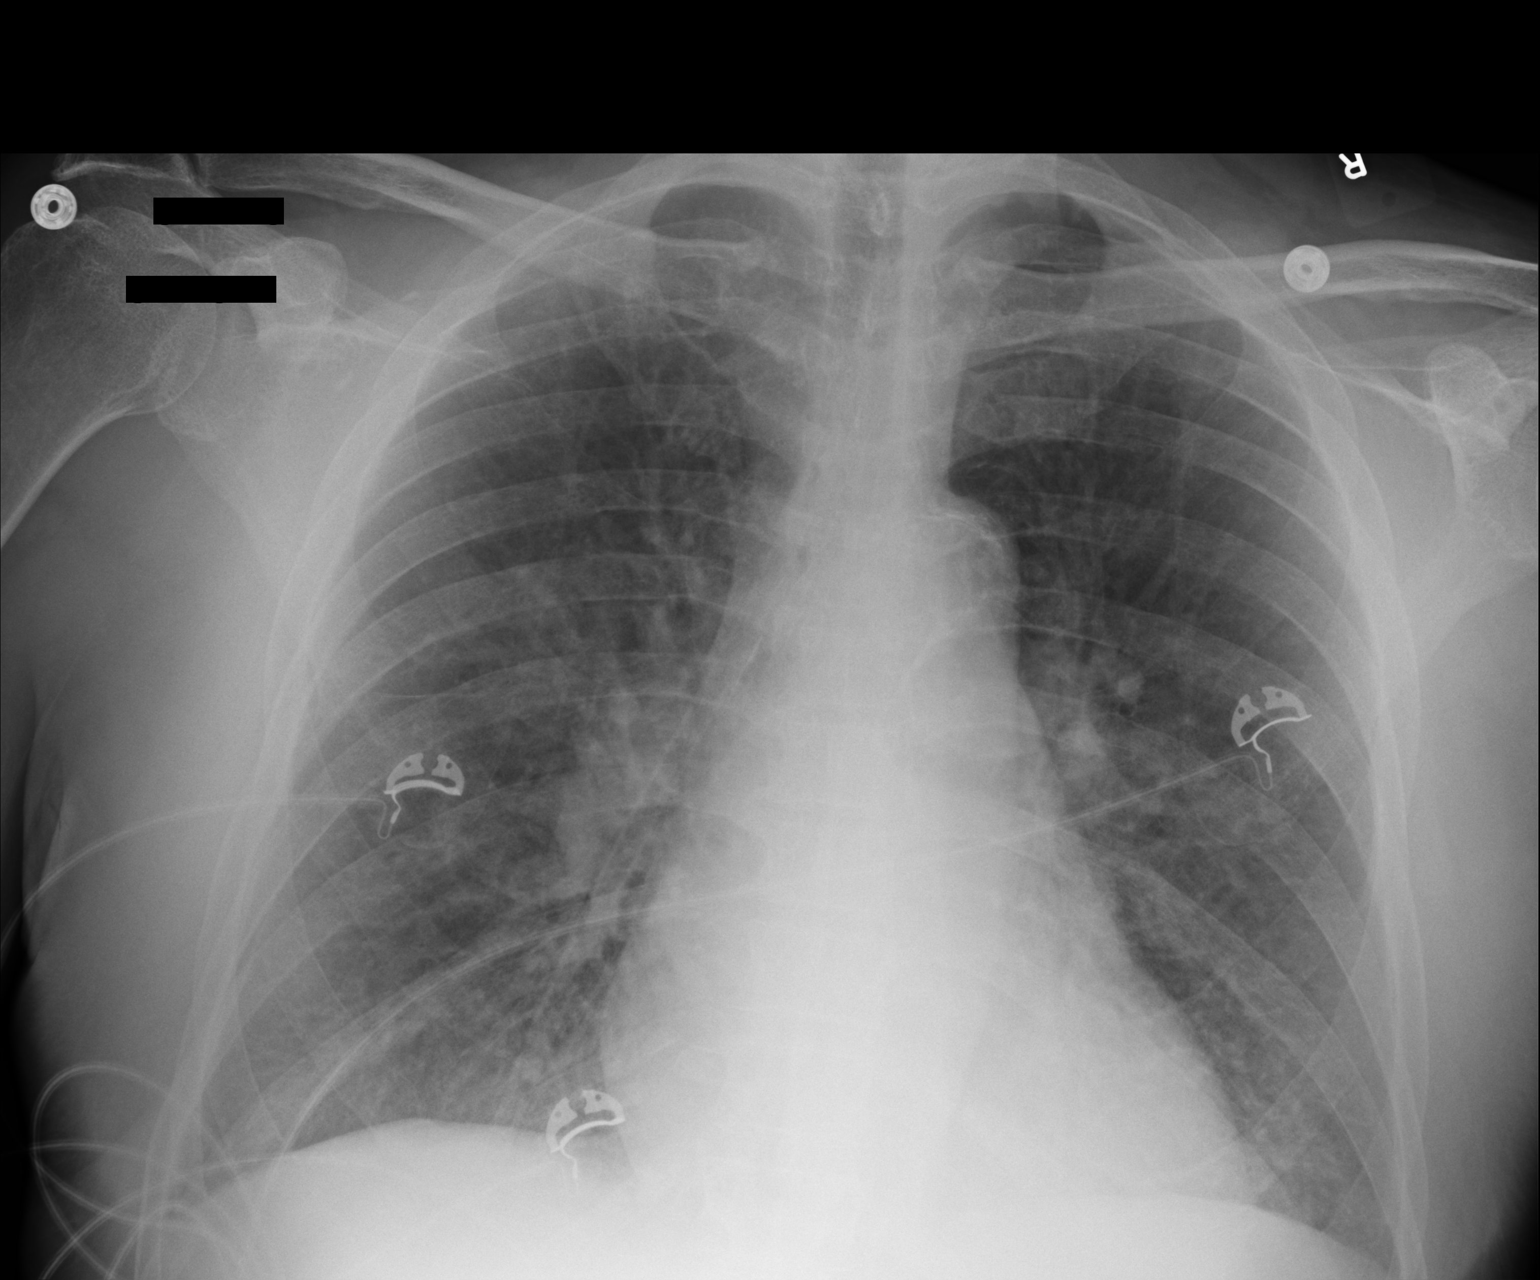

[1 of 1 positions shown; findings below may reference images not displayed]

FINDINGS: The lungs are well-expanded. The interstitial markings have improved
significantly. The pulmonary vascularity is less engorged. The heart
is top-normal in size. There is calcification in the wall of the
aortic arch. The bony thorax exhibits no acute abnormality.
IMPRESSION: Decreased pulmonary edema bilaterally consistent with improving CHF.
No alveolar pneumonia.

## 2018-10-09 ENCOUNTER — Ambulatory Visit (INDEPENDENT_AMBULATORY_CARE_PROVIDER_SITE_OTHER): Payer: Medicare Other | Admitting: *Deleted

## 2018-10-09 ENCOUNTER — Other Ambulatory Visit: Payer: Self-pay

## 2018-10-09 VITALS — Ht 73.0 in | Wt 229.1 lb

## 2018-10-09 DIAGNOSIS — D631 Anemia in chronic kidney disease: Secondary | ICD-10-CM | POA: Diagnosis not present

## 2018-10-09 DIAGNOSIS — I10 Essential (primary) hypertension: Secondary | ICD-10-CM | POA: Diagnosis not present

## 2018-10-09 DIAGNOSIS — E1121 Type 2 diabetes mellitus with diabetic nephropathy: Secondary | ICD-10-CM | POA: Diagnosis not present

## 2018-10-09 DIAGNOSIS — N185 Chronic kidney disease, stage 5: Secondary | ICD-10-CM | POA: Diagnosis not present

## 2018-10-09 DIAGNOSIS — Z0000 Encounter for general adult medical examination without abnormal findings: Secondary | ICD-10-CM

## 2018-10-09 NOTE — Progress Notes (Signed)
MEDICARE ANNUAL WELLNESS VISIT  10/09/2018  Telephone Visit Disclaimer This Medicare AWV was conducted by telephone due to national recommendations for restrictions regarding the COVID-19 Pandemic (e.g. social distancing).  I verified, using two identifiers, that I am speaking with [name]. or their authorized healthcare agent. I discussed the limitations, risks, security, and privacy concerns of performing an evaluation and management service by telephone and the potential availability of an in-person appointment in the future. The patient expressed understanding and agreed to proceed.   Subjective:  [name]. is a 73 y.o. male patient of Stacks, Cletus Gash, MD who had a Medicare Annual Wellness Visit today via telephone. Yarnell is Retired and lives with their spouse. he has 2 children. he reports that he is socially active and does interact with friends/family regularly. he is not physically active and enjoys watching drag racing.  Patient Care Team: Claretta Fraise, MD as PCP - General (Family Medicine) Debara Pickett Nadean Corwin, MD as PCP - Cardiology (Cardiology) Debara Pickett Nadean Corwin, MD as Consulting Physician (Cardiology) Garald Balding, MD as Consulting Physician (Orthopedic Surgery) Fran Lowes, MD as Consulting Physician (Nephrology)  Advanced Directives 10/09/2018 09/17/2018 09/07/2017 12/02/2016 07/26/2016  Does Patient Have a Medical Advance Directive? No No No No No  Would patient like information on creating a medical advance directive? Yes (MAU/Ambulatory/Procedural Areas - Information given) No - Patient declined No - Patient declined - No - Patient declined    Hospital Utilization Over the Past 12 Months: # of hospitalizations or ER visits: 0 # of surgeries: 0  Review of Systems    Patient reports that his overall health is unchanged compared to last year.  Patient Reported Readings (BP, Pulse, CBG, Weight, etc) CBG 185 after breakfast  Review of Systems:  No complaints  All other systems negative.  Pain Assessment Pain : No/denies pain     Current Medications & Allergies (verified) Allergies as of 10/09/2018      Reactions   Sulfa Antibiotics Hives      Medication List       Accurate as of October 09, 2018  8:47 AM. If you have any questions, ask your nurse or doctor.        acetaminophen 325 MG tablet Commonly known as: TYLENOL Take 2 tablets (650 mg total) by mouth every 6 (six) hours as needed for mild pain.   allopurinol 100 MG tablet Commonly known as: ZYLOPRIM TAKE 1 TABLET BY MOUTH EVERY DAY   Amitiza 24 MCG capsule Generic drug: lubiprostone TAKE 1 CAPSULE (24 MCG TOTAL) BY MOUTH 2 (TWO) TIMES DAILY.   amLODipine 10 MG tablet Commonly known as: NORVASC TAKE 1 TABLET BY MOUTH EVERY DAY   aspirin 81 MG EC tablet Take 81 mg by mouth daily. Swallow whole.   betamethasone valerate 0.1 % cream Commonly known as: VALISONE USE AS DIRECTED   carvedilol 25 MG tablet Commonly known as: COREG TAKE 1 & 1/2 TABLET BY MOUTH TWICE A DAY   cloNIDine 0.1 MG tablet Commonly known as: CATAPRES TAKE 2 TABLETS BY MOUTH THREE TIMES A DAY   doxazosin 4 MG tablet Commonly known as: CARDURA TAKE 1 TABLET (4 MG TOTAL) BY MOUTH 2 (TWO) TIMES DAILY.   ferrous sulfate 325 (65 FE) MG tablet Take 1 tablet (325 mg total) by mouth daily with breakfast.   finasteride 5 MG tablet Commonly known as: PROSCAR Take 5 mg by mouth daily.   fluticasone 50 MCG/ACT nasal spray Commonly known  as: FLONASE USE 2 SPRAYS IN EACH NOSTRIL AT BEDTIME   furosemide 40 MG tablet Commonly known as: LASIX Take 1.5 tablets (60 mg total) by mouth 2 (two) times daily.   gabapentin 100 MG capsule Commonly known as: NEURONTIN TAKE 1 CAPSULE (100 MG TOTAL) BY MOUTH THREE (3) TIMES A DAY.   glucose blood test strip Commonly known as: ONE TOUCH ULTRA TEST Use as instructed   hydrALAZINE 100 MG tablet Commonly known as: APRESOLINE TAKE 1 TABLET  (100 MG TOTAL) BY MOUTH 4 (FOUR) TIMES DAILY - BEFORE MEALS AND AT BEDTIME.   Insulin Glargine 100 UNIT/ML Solostar Pen Commonly known as: Lantus SoloStar Inject 12 Units into the skin daily. At 7 am   isosorbide dinitrate 20 MG tablet Commonly known as: ISORDIL TAKE 2 TABLETS (40 MG TOTAL) BY MOUTH 3 (THREE) TIMES DAILY.   levothyroxine 100 MCG tablet Commonly known as: SYNTHROID Take 1 tablet (100 mcg total) by mouth daily.   rosuvastatin 20 MG tablet Commonly known as: CRESTOR Take 1 tablet (20 mg total) by mouth every evening.   tamsulosin 0.4 MG Caps capsule Commonly known as: FLOMAX Take 0.8 mg by mouth daily.       History (reviewed): Past Medical History:  Diagnosis Date  . Acute blood loss anemia   . Acute pulmonary edema (HCC)   . CAD (coronary artery disease), native coronary artery    s/p NSTEMI and CABG x 3 (LIMA to LAD, SVG to diagonal, SVG to dRCA) on 08/05/16  . Carotid artery occlusion   . Chronic kidney disease   . Diabetes mellitus without complication ([place])   . Hypertension   . NSTEMI (non-ST elevated myocardial infarction) ([place]) 07/25/2016  . S/P CABG (coronary artery bypass graft)    Past Surgical History:  Procedure Laterality Date  . APPENDECTOMY    . CORONARY ARTERY BYPASS GRAFT N/A 08/05/2016   Procedure: CORONARY ARTERY BYPASS GRAFTING (CABG) x 3, LIMA to LAD, SVG to DIAGONAL, SVG to OM1, SVG to DISTAL RCA, USING LEFT MAMMARY ARTERY AND RIGHT GREATER SAPHENOUS VEIN HARVESTED ENDOSCOPICALLY;  Surgeon: [name], MD;  Location: [facility];  Service: Open Heart Surgery;  Laterality: N/A;  . JOINT REPLACEMENT Bilateral    hip replacements  . LEFT HEART CATH AND CORONARY ANGIOGRAPHY N/A 07/28/2016   Procedure: Left Heart Cath and Coronary Angiography;  Surgeon: Jettie Booze, MD;  Location: [facility];  Service: Cardiovascular;  Laterality: N/A;  . LUMBAR FUSION  2005  . TEE WITHOUT CARDIOVERSION N/A 08/05/2016   Procedure:  TRANSESOPHAGEAL ECHOCARDIOGRAM (TEE);  Surgeon: [name], MD;  Location: [facility];  Service: Open Heart Surgery;  Laterality: N/A;  . TONSILECTOMY, ADENOIDECTOMY, BILATERAL MYRINGOTOMY AND TUBES     Family History  Problem Relation Age of Onset  . Heart disease Mother   . Hypertension Mother   . Arthritis Mother   . Cancer Mother   . Diabetes Mother   . Hyperlipidemia Mother   . Kidney disease Mother   . Hypertension Father   . Arthritis Father   . Cancer Father   . Diabetes Father   . Hyperlipidemia Father   . Hyperlipidemia Sister   . Hypertension Sister   . Vision loss Sister   . Hyperlipidemia Brother   . Hypertension Brother   . Arthritis Maternal Grandmother   . Arthritis Maternal Grandfather   . Arthritis Paternal Grandmother   . Arthritis Paternal Grandfather   . Asthma Sister   . Hyperlipidemia  Sister   . Hypertension Sister   . Cancer Brother   . Diabetes Brother   . Hyperlipidemia Brother   . Hypertension Brother   . Cancer Brother   . Hyperlipidemia Brother   . Hypertension Brother   . Heart attack Son        49   Social History   Socioeconomic History  . Marital status: Married    Spouse name: Anne Ng  . Number of children: 2  . Years of education: 79  . Highest education level: 11th grade  Occupational History  . Occupation: Retired    Comment: Chief Technology Officer  Social Needs  . Financial resource strain: Not very hard  . Food insecurity    Worry: Never true    Inability: Never true  . Transportation needs    Medical: No    Non-medical: No  Tobacco Use  . Smoking status: Former Smoker    Quit date: 1980    Years since quitting: 40.6  . Smokeless tobacco: Never Used  Substance and Sexual Activity  . Alcohol use: No  . Drug use: No  . Sexual activity: Yes  Lifestyle  . Physical activity    Days per week: 0 days    Minutes per session: 0 min  . Stress: Only a little  Relationships  . Social connections     Talks on phone: More than three times a week    Gets together: More than three times a week    Attends religious service: More than 4 times per year    Active member of club or organization: Yes    Attends meetings of clubs or organizations: More than 4 times per year    Relationship status: Married  Other Topics Concern  . Not on file  Social History Narrative  . Not on file    Activities of Daily Living In your present state of health, do you have any difficulty performing the following activities: 10/09/2018 09/17/2018  Hearing? Y N  Vision? N Y  Comment - pt says he needs new glasses but gets a yearly eye exam, had his last exam 3-4 months ago  Difficulty concentrating or making decisions? Y Y  Comment - pt says he has more trouble remembering things "it goes in one side of my head and out the other"  Walking or climbing stairs? Y N  Dressing or bathing? N N  Doing errands, shopping? N Y  Comment - he likes to have someone walk along side him when he is out running errands or at the doctors office, makes him feel safer so he doesn't fall  Preparing Food and eating ? N N  Using the Toilet? N N  In the past six months, have you accidently leaked urine? N N  Do you have problems with loss of bowel control? N N  Managing your Medications? N N  Managing your Finances? N N  Housekeeping or managing your Housekeeping? N Y  Comment - wife does most of the cooking, cleaning and house work  Some recent data might be hidden    Patient Education/ Literacy How often do you need to have someone help you when you read instructions, pamphlets, or other written materials from your doctor or pharmacy?: 1 - Never What is the last grade level you completed in school?: 11th Grade  Exercise Current Exercise Habits: The patient does not participate in regular exercise at present  Diet Patient reports consuming 3 meals a day and  1 snack(s) a day Patient reports that his primary diet is: Regular  Patient reports that she does have regular access to food.   Depression Screen PHQ 2/9 Scores 10/09/2018 09/24/2018 09/17/2018 08/01/2018 07/10/2018 02/19/2018 01/23/2018  PHQ - 2 Score 0 0 1 0 0 0 0  PHQ- 9 Score - 0 - - - - -     Fall Risk Fall Risk  10/09/2018 09/24/2018 09/17/2018 08/01/2018 07/10/2018  Falls in the past year? 0 0 1 1 1   Number falls in past yr: 0 - 1 0 0  Injury with Fall? 0 - 1 (No Data) 1  Comment - - - Abrasions on lower leg. Left leg laceration.  Risk Factor Category  - - - - -  Risk for fall due to : History of fall(s);Impaired balance/gait - Impaired balance/gait;History of fall(s) - -  Follow up - - Falls prevention discussed - -  Comment - - discussed adequate lighting, hand rails in the bathroom, removing throw rugs - -     Objective:  [name]. seemed alert and oriented and he participated appropriately during our telephone visit.  Blood Pressure Weight BMI  BP Readings from Last 3 Encounters:  09/24/18 (!) 164/61  08/01/18 (!) 148/55  07/25/18 (!) 149/54   Wt Readings from Last 3 Encounters:  10/09/18 229 lb 0.9 oz (103.9 kg)  09/24/18 229 lb (103.9 kg)  08/01/18 238 lb (108 kg)   BMI Readings from Last 1 Encounters:  10/09/18 30.22 kg/m    *Unable to obtain current vital signs, weight, and BMI due to telephone visit type  Hearing/Vision  . Alcuin did  seem to have difficulty with hearing/understanding during the telephone conversation . Reports that he has not had a formal eye exam by an eye care professional within the past year . Reports that he has not had a formal hearing evaluation within the past year *Unable to fully assess hearing and vision during telephone visit type  Cognitive Function: 6CIT Screen 10/09/2018 09/17/2018  What Year? 0 points 0 points  What month? 0 points 0 points  What time? 0 points 0 points  Count back from 20 0 points 4 points  Months in reverse 4 points 4 points  Repeat phrase 2 points 10 points  Total Score  6 18   (Normal:0-7, Significant for Dysfunction: >8)  Normal Cognitive Function Screening: Yes   Immunization & Health Maintenance Record Immunization History  Administered Date(s) Administered  . Influenza, High Dose Seasonal PF 11/14/2017  . Influenza-Unspecified 01/03/2000, 11/23/2016, 11/14/2017  . Td 07/02/1991    Health Maintenance  Topic Date Due  . Hepatitis C Screening  1945/11/24  . FOOT EXAM  01/08/1955  . COLONOSCOPY  01/08/1995  . INFLUENZA VACCINE  09/15/2018  . TETANUS/TDAP  09/24/2019 (Originally 07/01/2001)  . PNA vac Low Risk Adult (1 of 2 - PCV13) 09/24/2019 (Originally 01/07/2010)  . HEMOGLOBIN A1C  03/27/2019  . OPHTHALMOLOGY EXAM  05/01/2019       Assessment  This is a routine wellness examination for [place].Marland Kitchen  Health Maintenance: Due or Overdue Health Maintenance Due  Topic Date Due  . Hepatitis C Screening  Oct 20, 1945  . FOOT EXAM  01/08/1955  . COLONOSCOPY  01/08/1995  . INFLUENZA VACCINE  09/15/2018    [name]. does not need a referral for Community Assistance: Care Management:   no Social Work:    no Prescription Assistance:  no Nutrition/Diabetes Education:  no  Plan:  Personalized Goals Goals Addressed   None    Personalized Health Maintenance & Screening Recommendations  Pneumococcal vaccine  Influenza vaccine Td vaccine Colorectal cancer screening Diabetes screening Advanced directives: has NO advanced directive  - add't info requested. Referral to SW: no  Lung Cancer Screening Recommended: no (Low Dose CT Chest recommended if Age 64-80 years, 30 pack-year currently smoking OR have quit w/in past 15 years) Hepatitis C Screening recommended: yes HIV Screening recommended: no  Advanced Directives: Written information was not prepared per patient's request.  Referrals & Orders No orders of the defined types were placed in this encounter.   Follow-up Plan . Follow-up with Claretta Fraise, MD as  planned    I have personally reviewed and noted the following in the patient's chart:   . Medical and social history . Use of alcohol, tobacco or illicit drugs  . Current medications and supplements . Functional ability and status . Nutritional status . Physical activity . Advanced directives . List of other physicians . Hospitalizations, surgeries, and ER visits in previous 12 months . Vitals . Screenings to include cognitive, depression, and falls . Referrals and appointments  In addition, I have reviewed and discussed with [name]. certain preventive protocols, quality metrics, and best practice recommendations. A written personalized care plan for preventive services as well as general preventive health recommendations is available and can be mailed to the patient at his request.      Wardell Heath, LPN  [id number]

## 2018-10-09 NOTE — Patient Instructions (Signed)
Mr. [name] , Thank you for taking time to come for your Medicare Wellness Visit. I appreciate your ongoing commitment to your health goals. Please review the following plan we discussed and let me know if I can assist you in the future.   These are the goals we discussed: Goals    . DIET - INCREASE WATER INTAKE     Try to drink 6-8 glasses of water daily.       This is a list of the screening recommended for you and due dates:  Health Maintenance  Topic Date Due  .  Hepatitis C: One time screening is recommended by Center for Disease Control  (CDC) for  adults born from [birth year] through 1966.   14-Aug-1945  . Complete foot exam   01/08/1955  . Colon Cancer Screening  01/08/1995  . Flu Shot  09/15/2018  . Tetanus Vaccine  09/24/2019*  . Pneumonia vaccines (1 of 2 - PCV13) 09/24/2019*  . Hemoglobin A1C  03/27/2019  . Eye exam for diabetics  05/01/2019  *Topic was postponed. The date shown is not the original due date.    Preventive Care 110 Years and Older, Male Preventive care refers to lifestyle choices and visits with your health care provider that can promote health and wellness. This includes:  A yearly physical exam. This is also called an annual well check.  Regular dental and eye exams.  Immunizations.  Screening for certain conditions.  Healthy lifestyle choices, such as diet and exercise. What can I expect for my preventive care visit? Physical exam Your health care provider will check:  Height and weight. These may be used to calculate body mass index (BMI), which is a measurement that tells if you are at a healthy weight.  Heart rate and blood pressure.  Your skin for abnormal spots. Counseling Your health care provider may ask you questions about:  Alcohol, tobacco, and drug use.  Emotional well-being.  Home and relationship well-being.  Sexual activity.  Eating habits.  History of falls.  Memory and ability to understand (cognition).  Work and work  Statistician. What immunizations do I need?  Influenza (flu) vaccine  This is recommended every year. Tetanus, diphtheria, and pertussis (Tdap) vaccine  You may need a Td booster every 10 years. Varicella (chickenpox) vaccine  You may need this vaccine if you have not already been vaccinated. Zoster (shingles) vaccine  You may need this after age 41. Pneumococcal conjugate (PCV13) vaccine  One dose is recommended after age 86. Pneumococcal polysaccharide (PPSV23) vaccine  One dose is recommended after age 52. Measles, mumps, and rubella (MMR) vaccine  You may need at least one dose of MMR if you were born in 1957 or later. You may also need a second dose. Meningococcal conjugate (MenACWY) vaccine  You may need this if you have certain conditions. Hepatitis A vaccine  You may need this if you have certain conditions or if you travel or work in places where you may be exposed to hepatitis A. Hepatitis B vaccine  You may need this if you have certain conditions or if you travel or work in places where you may be exposed to hepatitis B. Haemophilus influenzae type b (Hib) vaccine  You may need this if you have certain conditions. You may receive vaccines as individual doses or as more than one vaccine together in one shot (combination vaccines). Talk with your health care provider about the risks and benefits of combination vaccines. What tests do I need? Blood  tests  Lipid and cholesterol levels. These may be checked every 5 years, or more frequently depending on your overall health.  Hepatitis C test.  Hepatitis B test. Screening  Lung cancer screening. You may have this screening every year starting at age 55 if you have a 30-pack-year history of smoking and currently smoke or have quit within the past 15 years.  Colorectal cancer screening. All adults should have this screening starting at age 50 and continuing until age 75. Your health care provider may recommend  screening at age 45 if you are at increased risk. You will have tests every 1-10 years, depending on your results and the type of screening test.  Prostate cancer screening. Recommendations will vary depending on your family history and other risks.  Diabetes screening. This is done by checking your blood sugar (glucose) after you have not eaten for a while (fasting). You may have this done every 1-3 years.  Abdominal aortic aneurysm (AAA) screening. You may need this if you are a current or former smoker.  Sexually transmitted disease (STD) testing. Follow these instructions at home: Eating and drinking  Eat a diet that includes fresh fruits and vegetables, whole grains, lean protein, and low-fat dairy products. Limit your intake of foods with high amounts of sugar, saturated fats, and salt.  Take vitamin and mineral supplements as recommended by your health care provider.  Do not drink alcohol if your health care provider tells you not to drink.  If you drink alcohol: ? Limit how much you have to 0-2 drinks a day. ? Be aware of how much alcohol is in your drink. In the [place] one drink equals one 12 oz bottle of beer (355 mL), one 5 oz glass of wine (148 mL), or one 1 oz glass of hard liquor (44 mL). Lifestyle  Take daily care of your teeth and gums.  Stay active. Exercise for at least 30 minutes on 5 or more days each week.  Do not use any products that contain nicotine or tobacco, such as cigarettes, e-cigarettes, and chewing tobacco. If you need help quitting, ask your health care provider.  If you are sexually active, practice safe sex. Use a condom or other form of protection to prevent STIs (sexually transmitted infections).  Talk with your health care provider about taking a low-dose aspirin or statin. What's next?  Visit your health care provider once a year for a well check visit.  Ask your health care provider how often you should have your eyes and teeth checked.   Stay up to date on all vaccines. This information is not intended to replace advice given to you by your health care provider. Make sure you discuss any questions you have with your health care provider. Document Released: 02/27/2015 Document Revised: 01/25/2018 Document Reviewed: 01/25/2018 Elsevier Patient Education  2020 [employer]  

## 2018-10-11 DIAGNOSIS — Z79899 Other long term (current) drug therapy: Secondary | ICD-10-CM | POA: Diagnosis not present

## 2018-10-11 DIAGNOSIS — E559 Vitamin D deficiency, unspecified: Secondary | ICD-10-CM | POA: Diagnosis not present

## 2018-10-11 DIAGNOSIS — R809 Proteinuria, unspecified: Secondary | ICD-10-CM | POA: Diagnosis not present

## 2018-10-11 DIAGNOSIS — N185 Chronic kidney disease, stage 5: Secondary | ICD-10-CM | POA: Diagnosis not present

## 2018-10-11 DIAGNOSIS — D631 Anemia in chronic kidney disease: Secondary | ICD-10-CM | POA: Diagnosis not present

## 2018-10-11 DIAGNOSIS — D509 Iron deficiency anemia, unspecified: Secondary | ICD-10-CM | POA: Diagnosis not present

## 2018-10-14 ENCOUNTER — Other Ambulatory Visit: Payer: Self-pay | Admitting: Internal Medicine

## 2018-10-18 DIAGNOSIS — Z79899 Other long term (current) drug therapy: Secondary | ICD-10-CM | POA: Diagnosis not present

## 2018-10-18 DIAGNOSIS — R809 Proteinuria, unspecified: Secondary | ICD-10-CM | POA: Diagnosis not present

## 2018-10-18 DIAGNOSIS — E559 Vitamin D deficiency, unspecified: Secondary | ICD-10-CM | POA: Diagnosis not present

## 2018-10-18 DIAGNOSIS — D631 Anemia in chronic kidney disease: Secondary | ICD-10-CM | POA: Diagnosis not present

## 2018-10-18 DIAGNOSIS — N185 Chronic kidney disease, stage 5: Secondary | ICD-10-CM | POA: Diagnosis not present

## 2018-10-18 DIAGNOSIS — D509 Iron deficiency anemia, unspecified: Secondary | ICD-10-CM | POA: Diagnosis not present

## 2018-10-19 IMAGING — DX DG CHEST 1V PORT
1 series · 1 of 1 positions shown · non-contrast
Comparison: 08/08/2016.

CLINICAL DATA: 70-year-old male post CABG. Sore chest. Subsequent
encounter.

EXAM:
PORTABLE CHEST 1 VIEW

[chest ap]
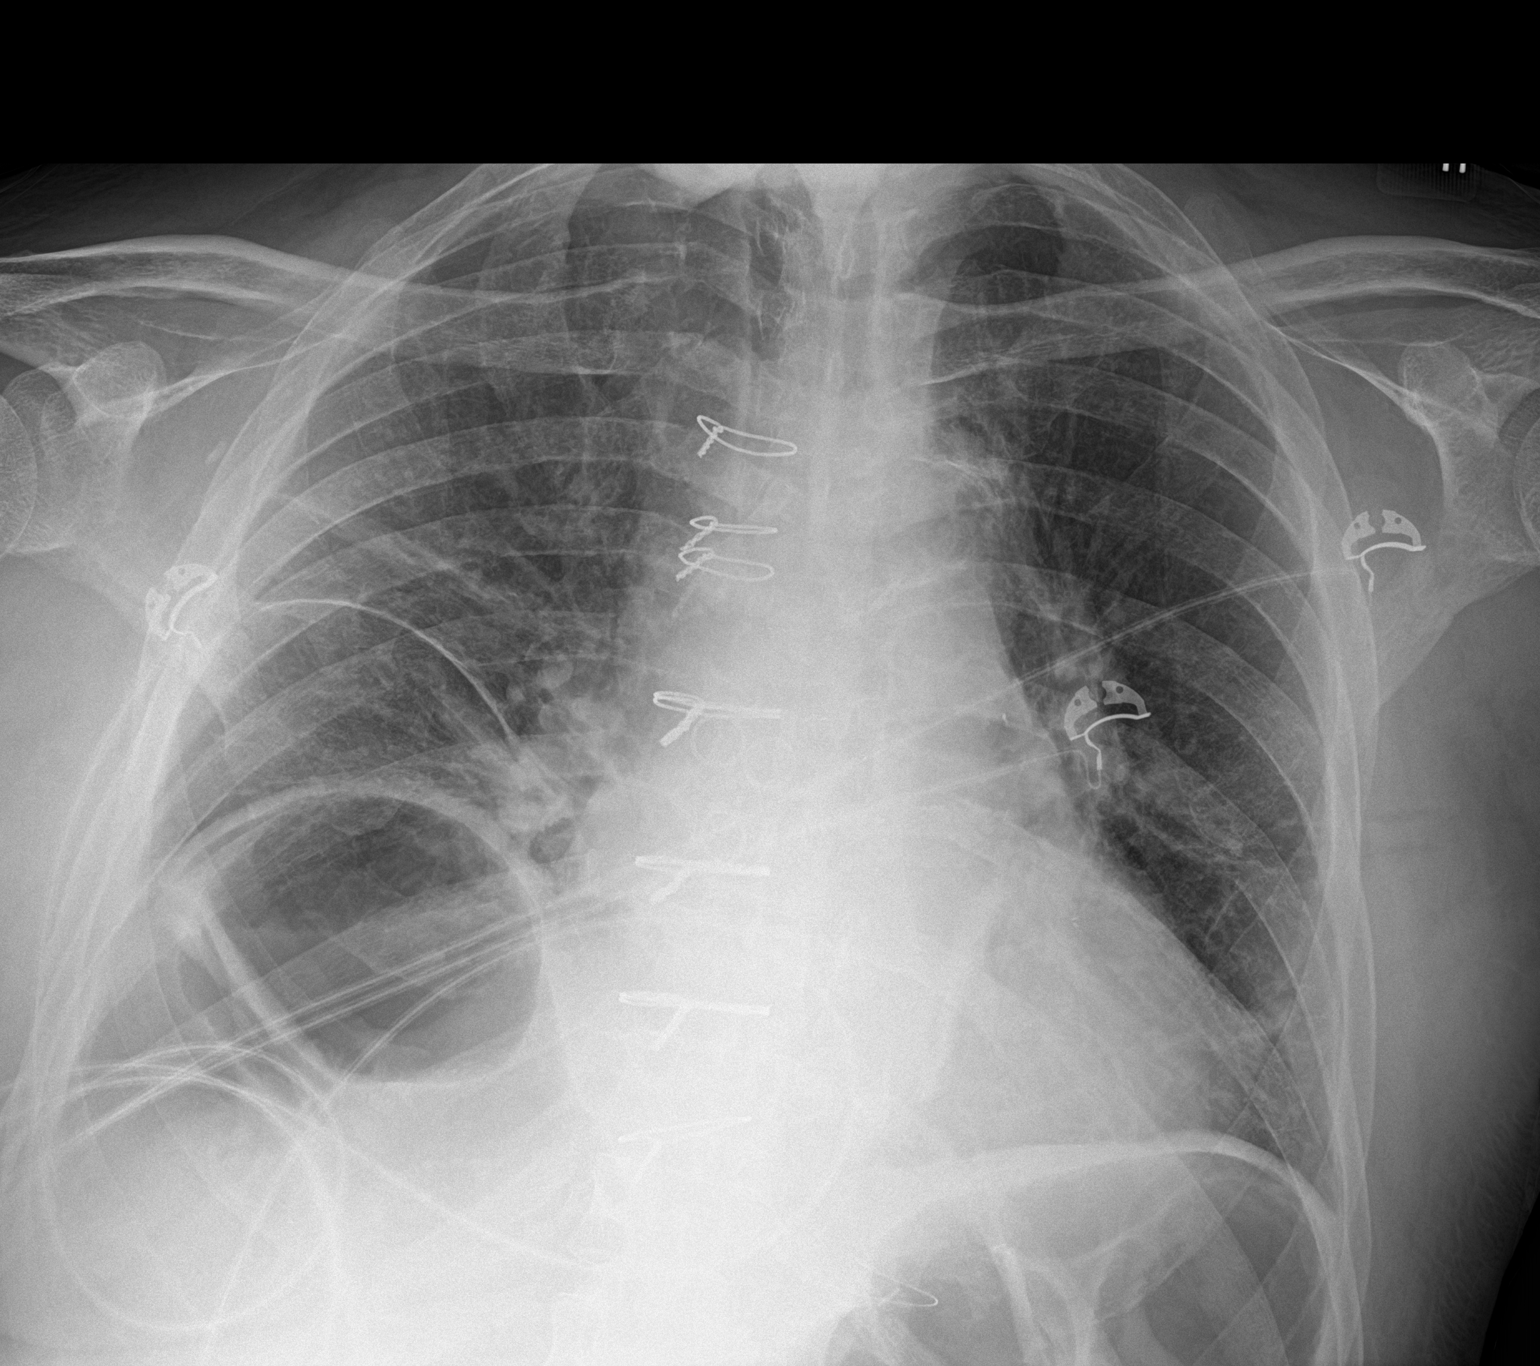

[1 of 1 positions shown; findings below may reference images not displayed]

FINDINGS: Post CABG.  Heart size top-normal.

Calcified slightly tortuous aorta.

Bibasilar and perihilar subsegmental atelectasis.

Elevated right hemidiaphragm. Gas filled colon extends below
diaphragm limiting evaluation for detection of possibility of free
intraperitoneal air.

Central pulmonary vascular prominence.

No pneumothorax.
IMPRESSION: Post CABG with top-normal heart size.

Subsegmental atelectasis perihilar and lower lobe region
bilaterally.

Central pulmonary vascular prominence.

Elevated right hemidiaphragm. Gas filled colon extends below
diaphragm limiting evaluation for detection of possibility of free
intraperitoneal air.

## 2018-10-21 IMAGING — DX DG CHEST 2V
3 series · 3 of 3 positions shown · non-contrast
Comparison: 08/09/2016

CLINICAL DATA: Recent CABG with weakness shortness of breath and
cough

EXAM:
CHEST  2 VIEW

[w chest lat (1 of 2)]
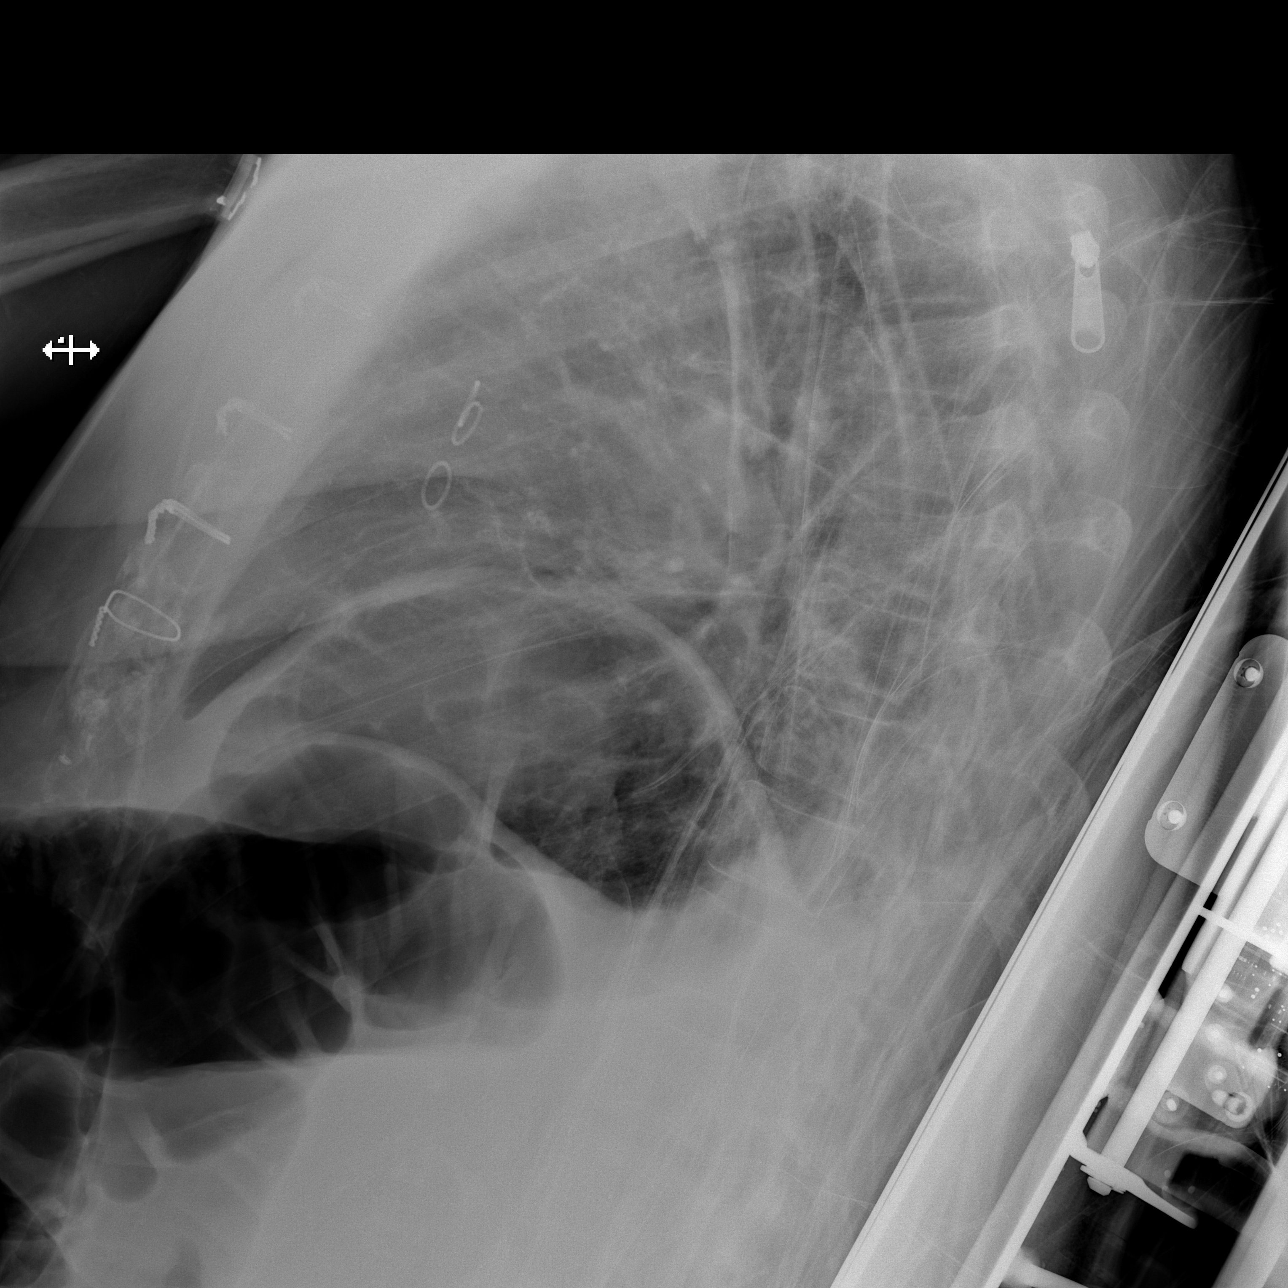

[w chest lat (2 of 2)]
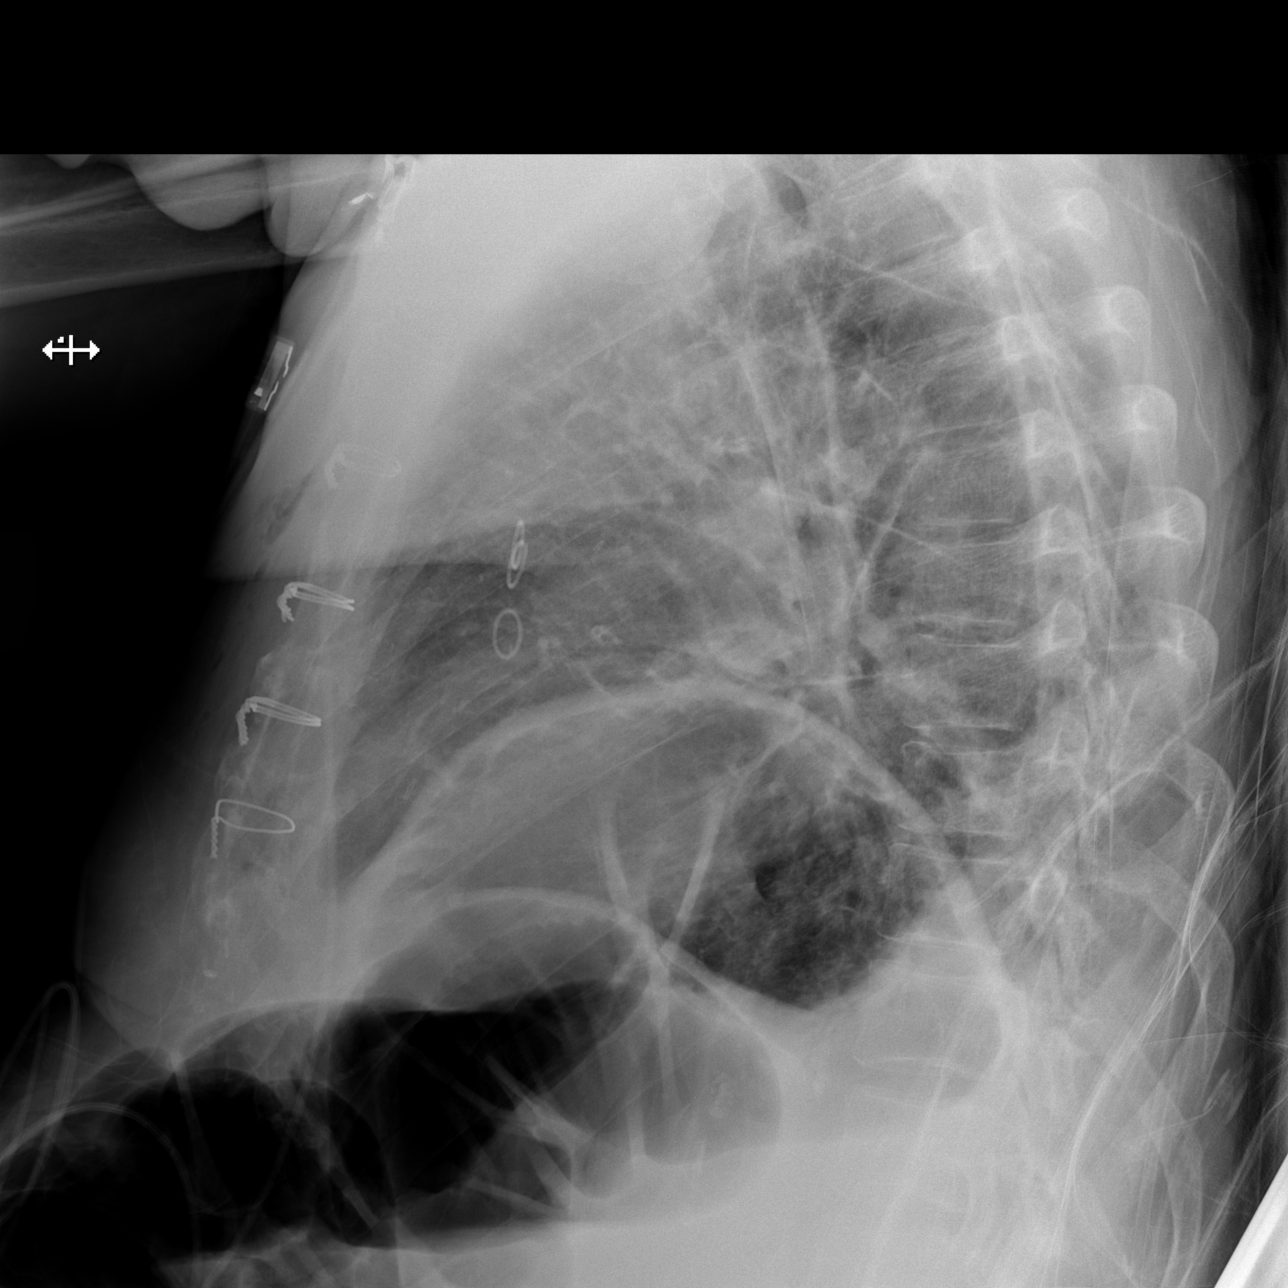

[x chest ap]
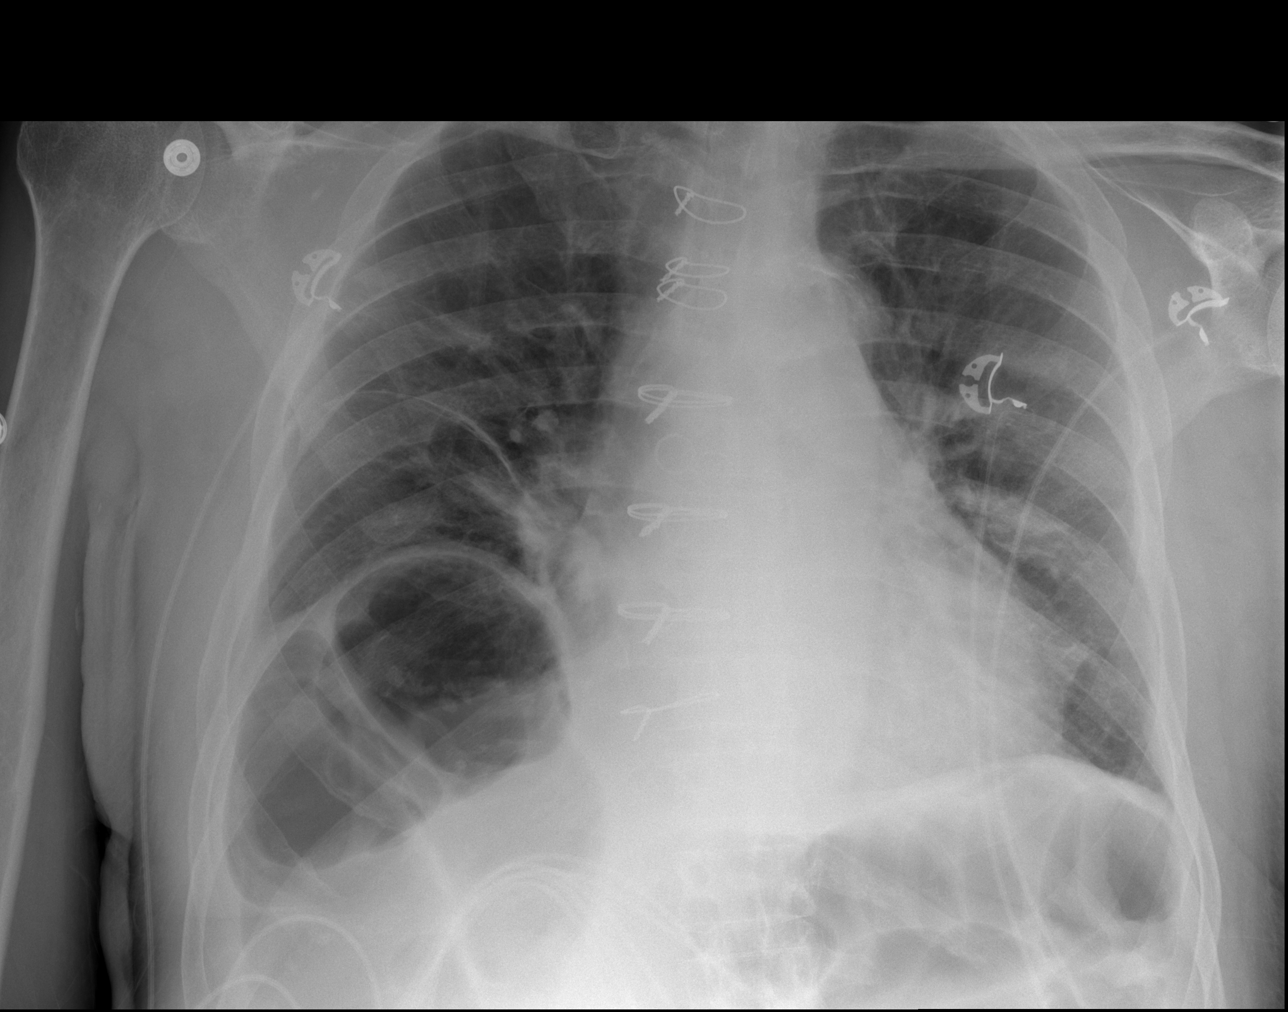

[3 of 3 positions shown; findings below may reference images not displayed]

FINDINGS: Elevated right diaphragm with linear atelectasis at the right base.
Air filled bowel in the upper abdomen as before. Post sternotomy
changes. Trace pleural effusion. Stable slightly enlarged
cardiomediastinal silhouette with atherosclerosis. No definite
pneumothorax is seen.
IMPRESSION: 1. Post sternotomy changes.
2. Elevated right diaphragm with subsegmental atelectasis at both
bases. Tiny pleural effusions
3. Stable degree of mild cardiomegaly

## 2018-10-25 DIAGNOSIS — D631 Anemia in chronic kidney disease: Secondary | ICD-10-CM | POA: Diagnosis not present

## 2018-10-25 DIAGNOSIS — D509 Iron deficiency anemia, unspecified: Secondary | ICD-10-CM | POA: Diagnosis not present

## 2018-10-25 DIAGNOSIS — N185 Chronic kidney disease, stage 5: Secondary | ICD-10-CM | POA: Diagnosis not present

## 2018-10-25 DIAGNOSIS — R809 Proteinuria, unspecified: Secondary | ICD-10-CM | POA: Diagnosis not present

## 2018-10-25 DIAGNOSIS — E559 Vitamin D deficiency, unspecified: Secondary | ICD-10-CM | POA: Diagnosis not present

## 2018-10-25 DIAGNOSIS — Z79899 Other long term (current) drug therapy: Secondary | ICD-10-CM | POA: Diagnosis not present

## 2018-11-01 DIAGNOSIS — R809 Proteinuria, unspecified: Secondary | ICD-10-CM | POA: Diagnosis not present

## 2018-11-01 DIAGNOSIS — D509 Iron deficiency anemia, unspecified: Secondary | ICD-10-CM | POA: Diagnosis not present

## 2018-11-01 DIAGNOSIS — E559 Vitamin D deficiency, unspecified: Secondary | ICD-10-CM | POA: Diagnosis not present

## 2018-11-01 DIAGNOSIS — N185 Chronic kidney disease, stage 5: Secondary | ICD-10-CM | POA: Diagnosis not present

## 2018-11-01 DIAGNOSIS — D631 Anemia in chronic kidney disease: Secondary | ICD-10-CM | POA: Diagnosis not present

## 2018-11-01 DIAGNOSIS — Z79899 Other long term (current) drug therapy: Secondary | ICD-10-CM | POA: Diagnosis not present

## 2018-11-02 ENCOUNTER — Other Ambulatory Visit: Payer: Self-pay | Admitting: Internal Medicine

## 2018-11-05 ENCOUNTER — Other Ambulatory Visit: Payer: Self-pay

## 2018-11-06 ENCOUNTER — Ambulatory Visit (INDEPENDENT_AMBULATORY_CARE_PROVIDER_SITE_OTHER): Payer: Medicare Other | Admitting: Family Medicine

## 2018-11-06 ENCOUNTER — Other Ambulatory Visit: Payer: Self-pay

## 2018-11-06 ENCOUNTER — Encounter: Payer: Self-pay | Admitting: Family Medicine

## 2018-11-06 VITALS — BP 161/57 | HR 63 | Temp 98.9°F | Resp 20 | Ht 73.0 in | Wt 240.0 lb

## 2018-11-06 DIAGNOSIS — N431 Infected hydrocele: Secondary | ICD-10-CM | POA: Diagnosis not present

## 2018-11-06 DIAGNOSIS — N185 Chronic kidney disease, stage 5: Secondary | ICD-10-CM | POA: Diagnosis not present

## 2018-11-06 MED ORDER — FUROSEMIDE 40 MG PO TABS: ORAL_TABLET | ORAL | 1 refills | Status: DC

## 2018-11-06 MED ORDER — DOXYCYCLINE HYCLATE 100 MG PO CAPS: 100.0000 mg | ORAL_CAPSULE | Freq: Two times a day (BID) | ORAL | 0 refills | Status: DC

## 2018-11-06 NOTE — Progress Notes (Signed)
Subjective:  Patient ID: Jeff Ranks., male    DOB: 1945-12-31  Age: 73 y.o. MRN: [id number]  CC: Medical Management of Chronic Issues (6 week med check )   HPIrecheck of renal function and edema. Denies dyspnea. Still swollen. Concerned about swelling at right sideof scrotum. Present several weeks and increasing in size. Pain is moderate and stable. No change in urine flow.    Depression screen [facility] 2/9 11/06/2018 10/09/2018 09/24/2018  Decreased Interest 0 0 0  Down, Depressed, Hopeless 0 0 0  PHQ - 2 Score 0 0 0  Altered sleeping - - 0  Tired, decreased energy - - 0  Change in appetite - - 0  Feeling bad or failure about yourself  - - 0  Trouble concentrating - - 0  Moving slowly or fidgety/restless - - 0  Suicidal thoughts - - 0  PHQ-9 Score - - 0  Difficult doing work/chores - - -    History [name] has a past medical history of Acute blood loss anemia, Acute pulmonary edema ([place]), CAD (coronary artery disease), native coronary artery, Carotid artery occlusion, Chronic kidney disease, Diabetes mellitus without complication ([place]), Hypertension, NSTEMI (non-ST elevated myocardial infarction) ([place]) (07/25/2016), and S/P CABG (coronary artery bypass graft).   He has a past surgical history that includes Lumbar fusion (2005); LEFT HEART CATH AND CORONARY ANGIOGRAPHY (N/A, 07/28/2016); Coronary artery bypass graft (N/A, 08/05/2016); TEE without cardioversion (N/A, 08/05/2016); Joint replacement (Bilateral); Appendectomy; and Tonsilectomy, adenoidectomy, bilateral myringotomy and tubes.   His family history includes Arthritis in his father, maternal grandfather, maternal grandmother, mother, paternal grandfather, and paternal grandmother; Asthma in his sister; Cancer in his brother, brother, father, and mother; Diabetes in his brother, father, and mother; Heart attack in his son; Heart disease in his mother; Hyperlipidemia in his brother, brother, brother, father, mother, sister, and  sister; Hypertension in his brother, brother, brother, father, mother, sister, and sister; Kidney disease in his mother; Vision loss in his sister.He reports that he quit smoking about 40 years ago. He has never used smokeless tobacco. He reports that he does not drink alcohol or use drugs.    ROS Review of Systems  Constitutional: Negative for fever.  Respiratory: Negative for shortness of breath.   Cardiovascular: Negative for chest pain.  Genitourinary: Positive for scrotal swelling and testicular pain.  Musculoskeletal: Negative for arthralgias.  Skin: Negative for rash.    Objective:  BP (!) 161/57 (BP Location: Right Arm, Cuff Size: Large)   Pulse 63   Temp 98.9 F (37.2 C)   Resp 20   Ht 6' 1"  (1.854 m)   Wt 240 lb (108.9 kg)   SpO2 99%   BMI 31.66 kg/m   BP Readings from Last 3 Encounters:  11/06/18 (!) 161/57  09/24/18 (!) 164/61  08/01/18 (!) 148/55    Wt Readings from Last 3 Encounters:  11/06/18 240 lb (108.9 kg)  10/09/18 229 lb 0.9 oz (103.9 kg)  09/24/18 229 lb (103.9 kg)     Physical Exam Vitals signs reviewed.  Constitutional:      Appearance: He is well-developed.  HENT:     Head: Normocephalic and atraumatic.     Right Ear: External ear normal.     Left Ear: External ear normal.     Mouth/Throat:     Pharynx: No oropharyngeal exudate or posterior oropharyngeal erythema.  Eyes:     Pupils: Pupils are equal, round, and reactive to light.  Neck:  Musculoskeletal: Normal range of motion and neck supple.  Cardiovascular:     Rate and Rhythm: Normal rate and regular rhythm.     Heart sounds: No murmur.  Pulmonary:     Effort: No respiratory distress.     Breath sounds: Normal breath sounds.  Genitourinary:    Penis: Normal and circumcised.      Scrotum/Testes:        Right: Tenderness, swelling and testicular hydrocele present.        Left: Tenderness, testicular hydrocele or varicocele not present.  Musculoskeletal:     Right lower  leg: Edema present.     Left lower leg: Edema present.  Skin:    General: Skin is warm and dry.  Neurological:     Mental Status: He is alert and oriented to person, place, and time.       Assessment & Plan:   [name] was seen today for medical management of chronic issues.  Diagnoses and all orders for this visit:  CKD (chronic kidney disease) stage 5, GFR less than 15 ml/min (HCC) -     BMP8+EGFR  Infected hydrocele -     US Scrotum; Future -     Ambulatory referral to Urology  Other orders -     furosemide (LASIX) 40 MG tablet; Two on awakening in the morning and two at lunchtime -     doxycycline (VIBRAMYCIN) 100 MG capsule; Take 1 capsule (100 mg total) by mouth 2 (two) times daily.       I have changed Jeff L. Auston Jr.'s furosemide. I am also having him start on doxycycline. Additionally, I am having him maintain his acetaminophen, ferrous sulfate, finasteride, aspirin, tamsulosin, Insulin Glargine, levothyroxine, rosuvastatin, carvedilol, glucose blood, gabapentin, allopurinol, hydrALAZINE, fluticasone, betamethasone valerate, Amitiza, doxazosin, amLODipine, cloNIDine, and isosorbide dinitrate.  Allergies as of 11/06/2018      Reactions   Sulfa Antibiotics Hives      Medication List       Accurate as of November 06, 2018  5:28 PM. If you have any questions, ask your nurse or doctor.        acetaminophen 325 MG tablet Commonly known as: TYLENOL Take 2 tablets (650 mg total) by mouth every 6 (six) hours as needed for mild pain.   allopurinol 100 MG tablet Commonly known as: ZYLOPRIM TAKE 1 TABLET BY MOUTH EVERY DAY   Amitiza 24 MCG capsule Generic drug: lubiprostone TAKE 1 CAPSULE (24 MCG TOTAL) BY MOUTH 2 (TWO) TIMES DAILY.   amLODipine 10 MG tablet Commonly known as: NORVASC TAKE 1 TABLET BY MOUTH EVERY DAY   aspirin 81 MG EC tablet Take 81 mg by mouth daily. Swallow whole.   betamethasone valerate 0.1 % cream Commonly known as: VALISONE USE  AS DIRECTED   carvedilol 25 MG tablet Commonly known as: COREG TAKE 1 & 1/2 TABLET BY MOUTH TWICE A DAY   cloNIDine 0.1 MG tablet Commonly known as: CATAPRES TAKE 2 TABLETS BY MOUTH 3 TIMES A DAY. Please make yearly appt with Dr. Debara Pickett for November for future refills. 1st attempt   doxazosin 4 MG tablet Commonly known as: CARDURA TAKE 1 TABLET (4 MG TOTAL) BY MOUTH 2 (TWO) TIMES DAILY.   doxycycline 100 MG capsule Commonly known as: Vibramycin Take 1 capsule (100 mg total) by mouth 2 (two) times daily. Started by: Claretta Fraise, MD   ferrous sulfate 325 (65 FE) MG tablet Take 1 tablet (325 mg total) by mouth daily with breakfast.  finasteride 5 MG tablet Commonly known as: PROSCAR Take 5 mg by mouth daily.   fluticasone 50 MCG/ACT nasal spray Commonly known as: FLONASE USE 2 SPRAYS IN EACH NOSTRIL AT BEDTIME   furosemide 40 MG tablet Commonly known as: LASIX Two on awakening in the morning and two at lunchtime What changed:   how much to take  how to take this  when to take this  additional instructions Changed by: Claretta Fraise, MD   gabapentin 100 MG capsule Commonly known as: NEURONTIN TAKE 1 CAPSULE (100 MG TOTAL) BY MOUTH THREE (3) TIMES A DAY.   glucose blood test strip Commonly known as: ONE TOUCH ULTRA TEST Use as instructed   hydrALAZINE 100 MG tablet Commonly known as: APRESOLINE TAKE 1 TABLET (100 MG TOTAL) BY MOUTH 4 (FOUR) TIMES DAILY - BEFORE MEALS AND AT BEDTIME.   Insulin Glargine 100 UNIT/ML Solostar Pen Commonly known as: Lantus SoloStar Inject 12 Units into the skin daily. At 7 am   isosorbide dinitrate 20 MG tablet Commonly known as: ISORDIL TAKE 2 TABLETS (40 MG TOTAL) BY MOUTH 3 (THREE) TIMES DAILY.   levothyroxine 100 MCG tablet Commonly known as: SYNTHROID Take 1 tablet (100 mcg total) by mouth daily.   rosuvastatin 20 MG tablet Commonly known as: CRESTOR Take 1 tablet (20 mg total) by mouth every evening.   tamsulosin  0.4 MG Caps capsule Commonly known as: FLOMAX Take 0.8 mg by mouth daily.        Follow-up: Return in about 6 weeks (around 12/18/2018).  Claretta Fraise, M.D.

## 2018-11-07 ENCOUNTER — Other Ambulatory Visit: Payer: Self-pay | Admitting: Family Medicine

## 2018-11-07 DIAGNOSIS — N431 Infected hydrocele: Secondary | ICD-10-CM

## 2018-11-07 LAB — BMP8+EGFR
BUN/Creatinine Ratio: 20 (ref 10–24)
BUN: 83 mg/dL (ref 8–27)
CO2: 18 mmol/L — ABNORMAL LOW (ref 20–29)
Calcium: 9.2 mg/dL (ref 8.6–10.2)
Chloride: 102 mmol/L (ref 96–106)
Creatinine, Ser: 4.15 mg/dL (ref 0.76–1.27)
GFR calc Af Amer: 15 mL/min/{1.73_m2} — ABNORMAL LOW (ref >59)
GFR calc non Af Amer: 13 mL/min/{1.73_m2} — ABNORMAL LOW (ref >59)
Glucose: 169 mg/dL — ABNORMAL HIGH (ref 65–99)
Potassium: 4.3 mmol/L (ref 3.5–5.2)
Sodium: 137 mmol/L (ref 134–144)

## 2018-11-08 ENCOUNTER — Other Ambulatory Visit: Payer: Self-pay

## 2018-11-08 ENCOUNTER — Ambulatory Visit (INDEPENDENT_AMBULATORY_CARE_PROVIDER_SITE_OTHER): Payer: Medicare Other | Admitting: Nurse Practitioner

## 2018-11-08 ENCOUNTER — Encounter: Payer: Self-pay | Admitting: Nurse Practitioner

## 2018-11-08 VITALS — BP 175/63 | HR 71 | Temp 99.3°F | Ht 73.0 in | Wt 240.0 lb

## 2018-11-08 DIAGNOSIS — D631 Anemia in chronic kidney disease: Secondary | ICD-10-CM | POA: Diagnosis not present

## 2018-11-08 DIAGNOSIS — R809 Proteinuria, unspecified: Secondary | ICD-10-CM | POA: Diagnosis not present

## 2018-11-08 DIAGNOSIS — L03115 Cellulitis of right lower limb: Secondary | ICD-10-CM | POA: Diagnosis not present

## 2018-11-08 DIAGNOSIS — D509 Iron deficiency anemia, unspecified: Secondary | ICD-10-CM | POA: Diagnosis not present

## 2018-11-08 DIAGNOSIS — Z79899 Other long term (current) drug therapy: Secondary | ICD-10-CM | POA: Diagnosis not present

## 2018-11-08 DIAGNOSIS — N185 Chronic kidney disease, stage 5: Secondary | ICD-10-CM | POA: Diagnosis not present

## 2018-11-08 DIAGNOSIS — E559 Vitamin D deficiency, unspecified: Secondary | ICD-10-CM | POA: Diagnosis not present

## 2018-11-08 MED ORDER — CIPROFLOXACIN HCL 500 MG PO TABS: 500.0000 mg | ORAL_TABLET | Freq: Two times a day (BID) | ORAL | 0 refills | Status: DC

## 2018-11-08 NOTE — Progress Notes (Signed)
   Subjective:    Patient ID: Jeff Ranks., male    DOB: 04/12/1945, 73 y.o.   MRN: [id number]   Chief Complaint: leg ulcer   HPI Patient come sin to have a leg ulcer looked at. He was seen on 11/06/18 but no mention of leg ulcer. He had gone to the hospital today for a shot and they told him he needs to have ulcer looked at. He says that he has had an ulcer there in the past and has returned.   Review of Systems  Constitutional: Positive for fatigue (always). Negative for activity change and appetite change.  HENT: Negative.   Eyes: Negative for pain.  Respiratory: Positive for shortness of breath (always).   Cardiovascular: Positive for leg swelling (always). Negative for chest pain and palpitations.  Gastrointestinal: Negative for abdominal pain.  Endocrine: Negative for polydipsia.  Genitourinary: Negative.   Skin: Negative for rash.  Neurological: Negative for dizziness, weakness and headaches.  Hematological: Does not bruise/bleed easily.  Psychiatric/Behavioral: Negative.   All other systems reviewed and are negative.      Objective:   Physical Exam Vitals signs and nursing note reviewed.  Constitutional:      Appearance: Normal appearance.  Cardiovascular:     Rate and Rhythm: Normal rate and regular rhythm.     Heart sounds: Normal heart sounds.  Pulmonary:     Breath sounds: Normal breath sounds.  Abdominal:     General: Abdomen is flat.     Palpations: Abdomen is soft.  Skin:    General: Skin is warm.     Comments: 3-5 cm elongated open raw looking lesion with yellowish excudate and smaller lesion medially  Neurological:     General: No focal deficit present.     Mental Status: He is alert and oriented to person, place, and time.  Psychiatric:        Mood and Affect: Mood normal.        Behavior: Behavior normal.     BP (!) 175/63   Pulse 71   Temp 99.3 F (37.4 C) (Temporal)   Ht 6\' 1"  (1.854 m)   Wt 240 lb (108.9 kg)   BMI 31.66 kg/m   *  wound cleaned with saline- antibiotic ointment and dressing applied     Assessment & Plan:  Jeff Ranks. in today with chief complaint of leg ulcer   1. Cellulitis of right lower extremity Keep clean and dry Do not ick or scratch Clean with antibacterial BID- antibiotic ointment and keep covered RTO if no better - ciprofloxacin (CIPRO) 500 MG tablet; Take 1 tablet (500 mg total) by mouth 2 (two) times daily.  Dispense: 20 tablet; Refill: 0  Mary-Margaret Hassell Done, FNP

## 2018-11-08 NOTE — Patient Instructions (Signed)
How to Change Your Wound Dressing A dressing is a material that is placed in and over a wound. A dressing helps your wound heal by protecting it from:  Germs (bacteria).  Another injury.  Getting too dry or too wet. There are several types of wound dressings. Some examples are:  Bandages.  Gauze pads.  Foam pads.  Antimicrobial dressings. These prevent or treat infection and may contain something that kills germs (an antiseptic), such as silver or iodine.  Calcium alginate. These are general guidelines. Follow your doctor's instructions about how to care for your wound. What are the risks? It is usually safe to change your dressing. The sticky (adhesive) tape that is used with a dressing may make your skin sore or irritated, or it may cause a rash. These are the most common problems. However, more serious problems can happen, such as:  Bleeding.  Infection. Supplies needed: Set up a clean area for wound care. You will need:  A trash bag that you can throw away. Have it open and ready to use.  Hand sanitizer.  Cleaning solution as told by your doctor. This may include: ? Germ-free (sterile) water. ? Germ-free salt water (saline). ? Wound cleanser. ? New wound dressing material. Make sure to open the dressing package so the dressing stays on the inside of the package. You may also need the following in your clean area:  A box of vinyl gloves.  Tape.  Adhesive remover.  Skin protectant. This may be a wipe, film, or spray.  Clean or germ-free scissors.  A cotton-tipped applicator. How to change your dressing How often you change your dressing will depend on your wound. Change the dressing as often as told by your doctor. Your doctor may change your dressing, or a family member, friend, or caregiver may be shown how to do it. It is important to:  Wash your hands before and after each dressing change. If you cannot use soap and water, use hand sanitizer.  Wear gloves  and protective clothing while changing a dressing. This may include eye protection.  Never let anyone change your dressing if he or she has an infection, a skin problem, or a skin wound or cut of any size. Preparing to change your dressing  Take a shower before you do the first dressing change of the day. Before you shower, ask your doctor if you should put plastic leak-proof sealing wrap over your dressing to protect it.  If needed, take pain medicine 30 minutes before you change your dressing as told by your doctor. Taking off your old dressing   Wash your hands with soap and water. Dry your hands with a clean towel. If you cannot use soap and water, use hand sanitizer.  Go to the clean area that you have set up with all the supplies you will need.  If you are using gloves, put the gloves on before you take off the dressing.  Gently take off any adhesive or tape by pulling it off in the direction of your hair growth. Only touch the outside edges of the dressing. ? If you are told to use an adhesive remover to loosen the edges of the dressing, make sure to avoid the wound area.  Take off the dressing. If the dressing sticks to your skin, wet the dressing with the cleaner that your doctor says to use. This helps it come off more easily.  Take off any gauze or packing in your wound.  Throw the   old dressing supplies into the trash bag.  Take off your gloves. To take off each glove, grab the cuff with your other hand and turn the glove inside out. Put the gloves in the trash right away.  Wash your hands with soap and water. Dry your hands with a clean towel. If you cannot use soap and water, use hand sanitizer. Cleaning your wound  Follow instructions from your doctor about how to clean your wound. This may include using the cleaner that your doctor recommends. ? You may need to use germ-free water to clean your wound if you are putting on a dressing that has silver in it.  Do not use  over-the-counter medicated or antiseptic creams, sprays, liquids, or dressings unless your doctor tells you to do that.  Use a clean gauze pad to clean the area fully with the cleaner that your doctor recommends.  Throw the gauze pad into the trash bag.  Wash your hands with soap and water. Dry your hands with a clean towel. If you cannot use soap and water, use hand sanitizer. Putting on the dressing  If your doctor recommended a skin protectant, put it on the skin around the wound.  Gently pack the wound if told by your doctor.  Cover the wound with the recommended dressing. Make sure to touch only the outside edges of the dressing. Do not touch the inside of the dressing.  Attach the dressing so all sides stay in place. You may do this with medical adhesive, roll gauze, or tape. If you use tape, do not wrap the tape all the way around your arm or leg.  Take off your gloves. Put them in the trash bag with the old dressing. Tie the bag shut and throw it away.  Wash your hands with soap and water. Dry your hands with a clean towel. If you cannot use soap and water, use hand sanitizer. Follow these instructions at home: Wound care   Check your wound every day for signs of infection, or as often as told by your doctor. Check for: ? More redness, swelling, or pain. ? More fluid or blood. ? Warmth. ? Pus or a bad smell. General instructions  Take over-the-counter and prescription medicines only as told by your doctor.  Ask your doctor if the medicine prescribed to you: ? Requires you to avoid driving or using heavy machinery. ? Can cause trouble pooping (constipation). You may need to take steps to prevent or treat trouble pooping:  Drink enough fluid to keep your pee (urine) pale yellow.  Take over-the-counter or prescription medicines.  Eat foods that are high in fiber. These include beans, whole grains, and fresh fruits and vegetables.  Limit foods that are high in fat and  sugar. These include fried or sweet foods.  Keep all follow-up visits as told by your doctor. This is important. Contact a doctor if:  You have new pain.  You have irritation, a rash, or itching around the wound or dressing.  It hurts to change your dressing.  Changing your dressing causes a lot of bleeding. Get help right away if:  You have very bad pain.  You have signs of infection, such as: ? More redness, swelling, or pain. ? More fluid or blood. ? Warmth. ? Pus or a bad smell. ? Red streaks leading from the wound. ? A fever. Summary  A dressing is a material that is placed in and over a wound.  A dressing helps your wound   heal.  Wash your hands before and after each dressing change.  Follow your doctor's instructions about how to care for your wound.  Check your wound for signs of infection. This information is not intended to replace advice given to you by your health care provider. Make sure you discuss any questions you have with your health care provider. Document Released: 04/29/2008 Document Revised: 09/28/2017 Document Reviewed: 09/28/2017 Elsevier Patient Education  2020 [employer]   

## 2018-11-09 ENCOUNTER — Ambulatory Visit (HOSPITAL_COMMUNITY)
Admission: RE | Admit: 2018-11-09 | Discharge: 2018-11-09 | Disposition: A | Discharge status: Home / Self Care | Payer: Medicare Other | Source: Ambulatory Visit | Attending: Family Medicine | Admitting: Family Medicine

## 2018-11-09 DIAGNOSIS — N431 Infected hydrocele: Secondary | ICD-10-CM | POA: Diagnosis present

## 2018-11-12 ENCOUNTER — Other Ambulatory Visit: Payer: Self-pay | Admitting: Family Medicine

## 2018-11-12 DIAGNOSIS — K4090 Unilateral inguinal hernia, without obstruction or gangrene, not specified as recurrent: Secondary | ICD-10-CM | POA: Insufficient documentation

## 2018-11-15 DIAGNOSIS — D631 Anemia in chronic kidney disease: Secondary | ICD-10-CM | POA: Diagnosis not present

## 2018-11-15 DIAGNOSIS — N185 Chronic kidney disease, stage 5: Secondary | ICD-10-CM | POA: Diagnosis not present

## 2018-11-15 DIAGNOSIS — D509 Iron deficiency anemia, unspecified: Secondary | ICD-10-CM | POA: Diagnosis not present

## 2018-11-15 DIAGNOSIS — Z79899 Other long term (current) drug therapy: Secondary | ICD-10-CM | POA: Diagnosis not present

## 2018-11-22 DIAGNOSIS — D509 Iron deficiency anemia, unspecified: Secondary | ICD-10-CM | POA: Diagnosis not present

## 2018-11-22 DIAGNOSIS — N185 Chronic kidney disease, stage 5: Secondary | ICD-10-CM | POA: Diagnosis not present

## 2018-11-22 DIAGNOSIS — D631 Anemia in chronic kidney disease: Secondary | ICD-10-CM | POA: Diagnosis not present

## 2018-11-22 DIAGNOSIS — Z79899 Other long term (current) drug therapy: Secondary | ICD-10-CM | POA: Diagnosis not present

## 2018-11-29 ENCOUNTER — Other Ambulatory Visit: Payer: Self-pay | Admitting: Family Medicine

## 2018-11-29 DIAGNOSIS — Z79899 Other long term (current) drug therapy: Secondary | ICD-10-CM | POA: Diagnosis not present

## 2018-11-29 DIAGNOSIS — N185 Chronic kidney disease, stage 5: Secondary | ICD-10-CM | POA: Diagnosis not present

## 2018-11-29 DIAGNOSIS — D631 Anemia in chronic kidney disease: Secondary | ICD-10-CM | POA: Diagnosis not present

## 2018-11-29 DIAGNOSIS — D509 Iron deficiency anemia, unspecified: Secondary | ICD-10-CM | POA: Diagnosis not present

## 2018-12-04 ENCOUNTER — Ambulatory Visit: Payer: Medicare Other | Admitting: General Surgery

## 2018-12-06 DIAGNOSIS — N185 Chronic kidney disease, stage 5: Secondary | ICD-10-CM | POA: Diagnosis not present

## 2018-12-06 DIAGNOSIS — D509 Iron deficiency anemia, unspecified: Secondary | ICD-10-CM | POA: Diagnosis not present

## 2018-12-06 DIAGNOSIS — D631 Anemia in chronic kidney disease: Secondary | ICD-10-CM | POA: Diagnosis not present

## 2018-12-06 DIAGNOSIS — Z79899 Other long term (current) drug therapy: Secondary | ICD-10-CM | POA: Diagnosis not present

## 2018-12-13 DIAGNOSIS — N185 Chronic kidney disease, stage 5: Secondary | ICD-10-CM | POA: Diagnosis not present

## 2018-12-13 DIAGNOSIS — D509 Iron deficiency anemia, unspecified: Secondary | ICD-10-CM | POA: Diagnosis not present

## 2018-12-13 DIAGNOSIS — D631 Anemia in chronic kidney disease: Secondary | ICD-10-CM | POA: Diagnosis not present

## 2018-12-13 DIAGNOSIS — Z79899 Other long term (current) drug therapy: Secondary | ICD-10-CM | POA: Diagnosis not present

## 2018-12-18 ENCOUNTER — Other Ambulatory Visit: Payer: Self-pay

## 2018-12-19 ENCOUNTER — Ambulatory Visit (INDEPENDENT_AMBULATORY_CARE_PROVIDER_SITE_OTHER): Payer: Medicare Other | Admitting: Family Medicine

## 2018-12-19 VITALS — BP 187/71 | HR 60 | Temp 98.2°F | Ht 73.0 in | Wt 235.8 lb

## 2018-12-19 DIAGNOSIS — I10 Essential (primary) hypertension: Secondary | ICD-10-CM

## 2018-12-19 DIAGNOSIS — N185 Chronic kidney disease, stage 5: Secondary | ICD-10-CM

## 2018-12-19 DIAGNOSIS — R350 Frequency of micturition: Secondary | ICD-10-CM

## 2018-12-19 DIAGNOSIS — R300 Dysuria: Secondary | ICD-10-CM | POA: Diagnosis not present

## 2018-12-19 DIAGNOSIS — I110 Hypertensive heart disease with heart failure: Secondary | ICD-10-CM

## 2018-12-19 DIAGNOSIS — E039 Hypothyroidism, unspecified: Secondary | ICD-10-CM | POA: Diagnosis not present

## 2018-12-19 DIAGNOSIS — E785 Hyperlipidemia, unspecified: Secondary | ICD-10-CM

## 2018-12-19 DIAGNOSIS — E119 Type 2 diabetes mellitus without complications: Secondary | ICD-10-CM | POA: Diagnosis not present

## 2018-12-19 DIAGNOSIS — I5042 Chronic combined systolic (congestive) and diastolic (congestive) heart failure: Secondary | ICD-10-CM

## 2018-12-19 DIAGNOSIS — N401 Enlarged prostate with lower urinary tract symptoms: Secondary | ICD-10-CM

## 2018-12-19 LAB — URINALYSIS, COMPLETE
Bilirubin, UA: NEGATIVE
Glucose, UA: NEGATIVE
Ketones, UA: NEGATIVE
Leukocytes,UA: NEGATIVE
Nitrite, UA: NEGATIVE
RBC, UA: NEGATIVE
Specific Gravity, UA: 1.015 (ref 1.005–1.030)
Urobilinogen, Ur: 0.2 mg/dL (ref 0.2–1.0)
pH, UA: 5.5 (ref 5.0–7.5)

## 2018-12-19 LAB — MICROSCOPIC EXAMINATION
Bacteria, UA: NONE SEEN
Renal Epithel, UA: NONE SEEN /hpf

## 2018-12-19 LAB — BAYER DCA HB A1C WAIVED: HB A1C (BAYER DCA - WAIVED): 8.6 % — ABNORMAL HIGH (ref <7.0)

## 2018-12-19 MED ORDER — FLUTICASONE PROPIONATE 50 MCG/ACT NA SUSP: NASAL | 1 refills | Status: DC

## 2018-12-19 MED ORDER — CARVEDILOL 25 MG PO TABS: ORAL_TABLET | ORAL | 3 refills | Status: DC

## 2018-12-19 MED ORDER — AMITIZA 24 MCG PO CAPS: 24.0000 ug | ORAL_CAPSULE | Freq: Two times a day (BID) | ORAL | 1 refills | Status: DC

## 2018-12-19 MED ORDER — FERROUS SULFATE 325 (65 FE) MG PO TABS: 325.0000 mg | ORAL_TABLET | Freq: Every day | ORAL | 3 refills | Status: DC

## 2018-12-19 MED ORDER — DOXAZOSIN MESYLATE 4 MG PO TABS: 4.0000 mg | ORAL_TABLET | Freq: Two times a day (BID) | ORAL | 0 refills | Status: DC

## 2018-12-19 MED ORDER — ROSUVASTATIN CALCIUM 20 MG PO TABS: 20.0000 mg | ORAL_TABLET | Freq: Every evening | ORAL | 1 refills | Status: DC

## 2018-12-19 MED ORDER — LEVOTHYROXINE SODIUM 100 MCG PO TABS: 100.0000 ug | ORAL_TABLET | Freq: Every day | ORAL | 1 refills | Status: DC

## 2018-12-19 NOTE — Patient Instructions (Signed)
Be sure to take two furosemide every morning before breakfast, and two more before lunch.

## 2018-12-20 DIAGNOSIS — D631 Anemia in chronic kidney disease: Secondary | ICD-10-CM | POA: Diagnosis not present

## 2018-12-20 DIAGNOSIS — Z79899 Other long term (current) drug therapy: Secondary | ICD-10-CM | POA: Diagnosis not present

## 2018-12-20 DIAGNOSIS — D509 Iron deficiency anemia, unspecified: Secondary | ICD-10-CM | POA: Diagnosis not present

## 2018-12-20 DIAGNOSIS — N185 Chronic kidney disease, stage 5: Secondary | ICD-10-CM | POA: Diagnosis not present

## 2018-12-20 DIAGNOSIS — R809 Proteinuria, unspecified: Secondary | ICD-10-CM | POA: Diagnosis not present

## 2018-12-20 LAB — CMP14+EGFR
ALT: 6 IU/L (ref 0–44)
AST: 10 IU/L (ref 0–40)
Albumin/Globulin Ratio: 1 — ABNORMAL LOW (ref 1.2–2.2)
Albumin: 3.9 g/dL (ref 3.7–4.7)
Alkaline Phosphatase: 79 IU/L (ref 39–117)
BUN/Creatinine Ratio: 20 (ref 10–24)
BUN: 77 mg/dL (ref 8–27)
Bilirubin Total: 0.4 mg/dL (ref 0.0–1.2)
CO2: 17 mmol/L — ABNORMAL LOW (ref 20–29)
Calcium: 9.4 mg/dL (ref 8.6–10.2)
Chloride: 104 mmol/L (ref 96–106)
Creatinine, Ser: 3.84 mg/dL (ref 0.76–1.27)
GFR calc Af Amer: 17 mL/min/{1.73_m2} — ABNORMAL LOW (ref >59)
GFR calc non Af Amer: 15 mL/min/{1.73_m2} — ABNORMAL LOW (ref >59)
Globulin, Total: 3.9 g/dL (ref 1.5–4.5)
Glucose: 143 mg/dL — ABNORMAL HIGH (ref 65–99)
Potassium: 4.2 mmol/L (ref 3.5–5.2)
Sodium: 137 mmol/L (ref 134–144)
Total Protein: 7.8 g/dL (ref 6.0–8.5)

## 2018-12-20 LAB — CBC WITH DIFFERENTIAL/PLATELET
Basophils Absolute: 0 10*3/uL (ref 0.0–0.2)
Basos: 1 %
EOS (ABSOLUTE): 0.2 10*3/uL (ref 0.0–0.4)
Eos: 3 %
Hematocrit: 27.4 % — ABNORMAL LOW (ref 37.5–51.0)
Hemoglobin: 8.8 g/dL — CL (ref 13.0–17.7)
Immature Grans (Abs): 0 10*3/uL (ref 0.0–0.1)
Immature Granulocytes: 1 %
Lymphocytes Absolute: 0.7 10*3/uL (ref 0.7–3.1)
Lymphs: 17 %
MCH: 29.8 pg (ref 26.6–33.0)
MCHC: 32.1 g/dL (ref 31.5–35.7)
MCV: 93 fL (ref 79–97)
Monocytes Absolute: 0.4 10*3/uL (ref 0.1–0.9)
Monocytes: 9 %
Neutrophils Absolute: 3.1 10*3/uL (ref 1.4–7.0)
Neutrophils: 69 %
Platelets: 120 10*3/uL — ABNORMAL LOW (ref 150–450)
RBC: 2.95 x10E6/uL — ABNORMAL LOW (ref 4.14–5.80)
RDW: 15.5 % — ABNORMAL HIGH (ref 11.6–15.4)
WBC: 4.4 10*3/uL (ref 3.4–10.8)

## 2018-12-20 LAB — LIPID PANEL
Chol/HDL Ratio: 4.8 ratio (ref 0.0–5.0)
Cholesterol, Total: 134 mg/dL (ref 100–199)
HDL: 28 mg/dL — ABNORMAL LOW (ref >39)
LDL Chol Calc (NIH): 89 mg/dL (ref 0–99)
Triglycerides: 89 mg/dL (ref 0–149)
VLDL Cholesterol Cal: 17 mg/dL (ref 5–40)

## 2018-12-20 LAB — TSH+FREE T4
Free T4: 1.13 ng/dL (ref 0.82–1.77)
TSH: 1.43 u[IU]/mL (ref 0.450–4.500)

## 2018-12-21 ENCOUNTER — Other Ambulatory Visit: Payer: Self-pay | Admitting: Family Medicine

## 2018-12-22 ENCOUNTER — Encounter: Payer: Self-pay | Admitting: Family Medicine

## 2018-12-22 LAB — URINE CULTURE

## 2018-12-22 MED ORDER — DOXYCYCLINE HYCLATE 100 MG PO CAPS: 100.0000 mg | ORAL_CAPSULE | Freq: Two times a day (BID) | ORAL | 0 refills | Status: DC

## 2018-12-22 NOTE — Progress Notes (Signed)
Subjective:  Patient ID: Jeff Ranks., male    DOB: 03-06-1945  Age: 73 y.o. MRN: [id number]  CC: Medical Management of Chronic Issues (six week recheck)   HPI Jeff Ranks. presents forFollow-up of diabetes. Patient checks blood sugar at home.   134today. That is his trend fasting and not checking postprandial Patient denies symptoms such as polyuria, polydipsia, excessive hunger, nausea No significant hypoglycemic spells noted. Medications reviewed. Pt reports taking them regularly without complication/adverse reaction being reported today.  Checking feet daily.   History [name] has a past medical history of Acute blood loss anemia, Acute pulmonary edema ([place]), CAD (coronary artery disease), native coronary artery, Carotid artery occlusion, Chronic kidney disease, Diabetes mellitus without complication ([place]), Hypertension, NSTEMI (non-ST elevated myocardial infarction) ([place]) (07/25/2016), and S/P CABG (coronary artery bypass graft).   He has a past surgical history that includes Lumbar fusion (2005); LEFT HEART CATH AND CORONARY ANGIOGRAPHY (N/A, 07/28/2016); Coronary artery bypass graft (N/A, 08/05/2016); TEE without cardioversion (N/A, 08/05/2016); Joint replacement (Bilateral); Appendectomy; and Tonsilectomy, adenoidectomy, bilateral myringotomy and tubes.   His family history includes Arthritis in his father, maternal grandfather, maternal grandmother, mother, paternal grandfather, and paternal grandmother; Asthma in his sister; Cancer in his brother, brother, father, and mother; Diabetes in his brother, father, and mother; Heart attack in his son; Heart disease in his mother; Hyperlipidemia in his brother, brother, brother, father, mother, sister, and sister; Hypertension in his brother, brother, brother, father, mother, sister, and sister; Kidney disease in his mother; Vision loss in his sister.He reports that he quit smoking about 40 years ago. He has never used smokeless  tobacco. He reports that he does not drink alcohol or use drugs.  Current Outpatient Medications on File Prior to Visit  Medication Sig Dispense Refill   acetaminophen (TYLENOL) 325 MG tablet Take 2 tablets (650 mg total) by mouth every 6 (six) hours as needed for mild pain.     amLODipine (NORVASC) 10 MG tablet TAKE 1 TABLET BY MOUTH EVERY DAY 90 tablet 1   aspirin 81 MG EC tablet Take 81 mg by mouth daily. Swallow whole.     betamethasone valerate (VALISONE) 0.1 % cream USE AS DIRECTED 45 g 3   cloNIDine (CATAPRES) 0.1 MG tablet TAKE 2 TABLETS BY MOUTH 3 TIMES A DAY. Please make yearly appt with Dr. Debara Pickett for November for future refills. 1st attempt 540 tablet 0   finasteride (PROSCAR) 5 MG tablet Take 5 mg by mouth daily.  3   furosemide (LASIX) 40 MG tablet Two on awakening in the morning and two at lunchtime 360 tablet 1   gabapentin (NEURONTIN) 100 MG capsule TAKE 1 CAPSULE (100 MG TOTAL) BY MOUTH THREE (3) TIMES A DAY. 270 capsule 3   glucose blood (ONE TOUCH ULTRA TEST) test strip Use as instructed 100 each 12   hydrALAZINE (APRESOLINE) 100 MG tablet TAKE 1 TABLET (100 MG TOTAL) BY MOUTH 4 (FOUR) TIMES DAILY - BEFORE MEALS AND AT BEDTIME. 360 tablet 3   Insulin Glargine (LANTUS SOLOSTAR) 100 UNIT/ML Solostar Pen Inject 12 Units into the skin daily. At 7 am 5 pen PRN   isosorbide dinitrate (ISORDIL) 20 MG tablet TAKE 2 TABLETS (40 MG TOTAL) BY MOUTH 3 (THREE) TIMES DAILY. 540 tablet 0   tamsulosin (FLOMAX) 0.4 MG CAPS capsule Take 0.8 mg by mouth daily.  11   No current facility-administered medications on file prior to visit.     ROS Review of Systems  Constitutional:  Negative.   HENT: Negative.   Eyes: Negative for visual disturbance.  Respiratory: Negative for cough and shortness of breath.   Cardiovascular: Positive for leg swelling (increasing). Negative for chest pain.  Gastrointestinal: Negative for abdominal pain, diarrhea, nausea and vomiting.  Genitourinary:  Negative for difficulty urinating.  Musculoskeletal: Negative for arthralgias and myalgias.  Skin: Negative for rash.  Neurological: Negative for headaches.  Psychiatric/Behavioral: Negative for sleep disturbance.    Objective:  BP (!) 187/71    Pulse 60    Temp 98.2 F (36.8 C) (Temporal)    Ht 6' 1"  (1.854 m)    Wt 235 lb 12.8 oz (107 kg)    SpO2 100%    BMI 31.11 kg/m   BP Readings from Last 3 Encounters:  12/19/18 (!) 187/71  11/08/18 (!) 175/63  11/06/18 (!) 161/57    Wt Readings from Last 3 Encounters:  12/19/18 235 lb 12.8 oz (107 kg)  11/08/18 240 lb (108.9 kg)  11/06/18 240 lb (108.9 kg)     Physical Exam Constitutional:      General: He is not in acute distress.    Appearance: He is well-developed.  HENT:     Head: Normocephalic and atraumatic.     Right Ear: External ear normal.     Left Ear: External ear normal.     Nose: Nose normal.  Eyes:     Conjunctiva/sclera: Conjunctivae normal.     Pupils: Pupils are equal, round, and reactive to light.  Neck:     Musculoskeletal: Normal range of motion and neck supple.  Cardiovascular:     Rate and Rhythm: Normal rate and regular rhythm.     Heart sounds: Normal heart sounds. No murmur.  Pulmonary:     Effort: Pulmonary effort is normal. No respiratory distress.     Breath sounds: Normal breath sounds. No wheezing or rales.  Abdominal:     Palpations: Abdomen is soft.     Tenderness: There is no abdominal tenderness.  Musculoskeletal: Normal range of motion.        General: Swelling (3+ BLE) present.  Skin:    General: Skin is warm and dry.  Neurological:     Mental Status: He is alert and oriented to person, place, and time.     Deep Tendon Reflexes: Reflexes are normal and symmetric.  Psychiatric:        Behavior: Behavior normal.        Thought Content: Thought content normal.        Judgment: Judgment normal.       Assessment & Plan:   [name] was seen today for medical management of chronic  issues.  Diagnoses and all orders for this visit:  Hypothyroidism, unspecified type -     levothyroxine (SYNTHROID) 100 MCG tablet; Take 1 tablet (100 mcg total) by mouth daily. -     CBC with Differential/Platelet -     TSH + free T4  Diabetes mellitus type 2 in nonobese (HCC) -     CBC with Differential/Platelet -     CMP14+EGFR -     Bayer DCA Hb A1c Waived  Stage 5 chronic kidney disease (HCC) -     CBC with Differential/Platelet -     CMP14+EGFR  Dyslipidemia -     rosuvastatin (CRESTOR) 20 MG tablet; Take 1 tablet (20 mg total) by mouth every evening. -     CBC with Differential/Platelet -     CMP14+EGFR -     Lipid  panel  Essential hypertension -     Ambulatory referral to Cardiology  Hypertensive heart disease with chronic combined systolic and diastolic congestive heart failure (HCC) -     carvedilol (COREG) 25 MG tablet; TAKE 1 & 1/2 TABLET BY MOUTH TWICE A DAY -     doxazosin (CARDURA) 4 MG tablet; Take 1 tablet (4 mg total) by mouth 2 (two) times daily. -     Ambulatory referral to Cardiology  Dysuria -     Urinalysis, Complete -     Urine Culture -     Microscopic Examination -     doxycycline (VIBRAMYCIN) 100 MG capsule; Take 1 capsule (100 mg total) by mouth 2 (two) times daily.  Benign prostatic hyperplasia with urinary frequency -     doxazosin (CARDURA) 4 MG tablet; Take 1 tablet (4 mg total) by mouth 2 (two) times daily.  Other orders -     AMITIZA 24 MCG capsule; Take 1 capsule (24 mcg total) by mouth 2 (two) times daily. -     ferrous sulfate 325 (65 FE) MG tablet; Take 1 tablet (325 mg total) by mouth daily with breakfast. -     fluticasone (FLONASE) 50 MCG/ACT nasal spray; USE 2 SPRAYS IN EACH NOSTRIL AT BEDTIME      I have discontinued Kenneth L. Gentzler Jr.'s doxycycline and ciprofloxacin. I have also changed his levothyroxine. Additionally, I am having him start on doxycycline. Lastly, I am having him maintain his acetaminophen, finasteride,  aspirin, tamsulosin, Insulin Glargine, glucose blood, gabapentin, hydrALAZINE, betamethasone valerate, amLODipine, cloNIDine, isosorbide dinitrate, furosemide, Amitiza, carvedilol, doxazosin, ferrous sulfate, fluticasone, and rosuvastatin.  Meds ordered this encounter  Medications   AMITIZA 24 MCG capsule    Sig: Take 1 capsule (24 mcg total) by mouth 2 (two) times daily.    Dispense:  180 capsule    Refill:  1   carvedilol (COREG) 25 MG tablet    Sig: TAKE 1 & 1/2 TABLET BY MOUTH TWICE A DAY    Dispense:  270 tablet    Refill:  3   doxazosin (CARDURA) 4 MG tablet    Sig: Take 1 tablet (4 mg total) by mouth 2 (two) times daily.    Dispense:  180 tablet    Refill:  0   ferrous sulfate 325 (65 FE) MG tablet    Sig: Take 1 tablet (325 mg total) by mouth daily with breakfast.    Refill:  3   fluticasone (FLONASE) 50 MCG/ACT nasal spray    Sig: USE 2 SPRAYS IN EACH NOSTRIL AT BEDTIME    Dispense:  48 mL    Refill:  1   levothyroxine (SYNTHROID) 100 MCG tablet    Sig: Take 1 tablet (100 mcg total) by mouth daily.    Dispense:  90 tablet    Refill:  1   rosuvastatin (CRESTOR) 20 MG tablet    Sig: Take 1 tablet (20 mg total) by mouth every evening.    Dispense:  90 tablet    Refill:  1   doxycycline (VIBRAMYCIN) 100 MG capsule    Sig: Take 1 capsule (100 mg total) by mouth 2 (two) times daily.    Dispense:  20 capsule    Refill:  0     Follow-up: No follow-ups on file.  Claretta Fraise, M.D.

## 2018-12-24 ENCOUNTER — Other Ambulatory Visit: Payer: Self-pay | Admitting: Family Medicine

## 2018-12-24 DIAGNOSIS — E1022 Type 1 diabetes mellitus with diabetic chronic kidney disease: Secondary | ICD-10-CM

## 2018-12-24 MED ORDER — LANTUS SOLOSTAR 100 UNIT/ML ~~LOC~~ SOPN: 12.0000 [IU] | PEN_INJECTOR | Freq: Every day | SUBCUTANEOUS | 1 refills | Status: DC

## 2018-12-24 NOTE — Telephone Encounter (Signed)
Pt aware refill sent 

## 2018-12-24 NOTE — Telephone Encounter (Signed)
What is the name of the medication? Insulin Glargine 100 unit/ml Solarstar Pen- Patient is out  Have you contacted your pharmacy to request a refill? YES   Which pharmacy would you like this sent to? [facility]   Patient notified that their request is being sent to the clinical staff for review and that they should receive a call once it is complete. If they do not receive a call within 24 hours they can check with their pharmacy or our office.

## 2018-12-25 ENCOUNTER — Ambulatory Visit: Payer: Medicare Other | Admitting: General Surgery

## 2018-12-27 DIAGNOSIS — Z79899 Other long term (current) drug therapy: Secondary | ICD-10-CM | POA: Diagnosis not present

## 2018-12-27 DIAGNOSIS — D631 Anemia in chronic kidney disease: Secondary | ICD-10-CM | POA: Diagnosis not present

## 2018-12-27 DIAGNOSIS — N185 Chronic kidney disease, stage 5: Secondary | ICD-10-CM | POA: Diagnosis not present

## 2018-12-27 DIAGNOSIS — D509 Iron deficiency anemia, unspecified: Secondary | ICD-10-CM | POA: Diagnosis not present

## 2018-12-27 DIAGNOSIS — R809 Proteinuria, unspecified: Secondary | ICD-10-CM | POA: Diagnosis not present

## 2019-01-03 ENCOUNTER — Encounter: Payer: Self-pay | Admitting: Family Medicine

## 2019-01-03 DIAGNOSIS — D509 Iron deficiency anemia, unspecified: Secondary | ICD-10-CM | POA: Diagnosis not present

## 2019-01-03 DIAGNOSIS — D631 Anemia in chronic kidney disease: Secondary | ICD-10-CM | POA: Diagnosis not present

## 2019-01-03 DIAGNOSIS — R809 Proteinuria, unspecified: Secondary | ICD-10-CM | POA: Diagnosis not present

## 2019-01-03 DIAGNOSIS — N185 Chronic kidney disease, stage 5: Secondary | ICD-10-CM | POA: Diagnosis not present

## 2019-01-03 DIAGNOSIS — Z79899 Other long term (current) drug therapy: Secondary | ICD-10-CM | POA: Diagnosis not present

## 2019-01-04 ENCOUNTER — Other Ambulatory Visit: Payer: Self-pay | Admitting: Family Medicine

## 2019-01-14 DIAGNOSIS — D631 Anemia in chronic kidney disease: Secondary | ICD-10-CM | POA: Diagnosis not present

## 2019-01-14 DIAGNOSIS — R809 Proteinuria, unspecified: Secondary | ICD-10-CM | POA: Diagnosis not present

## 2019-01-14 DIAGNOSIS — Z79899 Other long term (current) drug therapy: Secondary | ICD-10-CM | POA: Diagnosis not present

## 2019-01-14 DIAGNOSIS — D509 Iron deficiency anemia, unspecified: Secondary | ICD-10-CM | POA: Diagnosis not present

## 2019-01-14 DIAGNOSIS — N185 Chronic kidney disease, stage 5: Secondary | ICD-10-CM | POA: Diagnosis not present

## 2019-01-16 DIAGNOSIS — D631 Anemia in chronic kidney disease: Secondary | ICD-10-CM | POA: Diagnosis not present

## 2019-01-16 DIAGNOSIS — N189 Chronic kidney disease, unspecified: Secondary | ICD-10-CM | POA: Diagnosis not present

## 2019-01-16 DIAGNOSIS — I129 Hypertensive chronic kidney disease with stage 1 through stage 4 chronic kidney disease, or unspecified chronic kidney disease: Secondary | ICD-10-CM | POA: Diagnosis not present

## 2019-01-16 DIAGNOSIS — R600 Localized edema: Secondary | ICD-10-CM | POA: Diagnosis not present

## 2019-01-16 DIAGNOSIS — R809 Proteinuria, unspecified: Secondary | ICD-10-CM | POA: Diagnosis not present

## 2019-01-17 DIAGNOSIS — Z79899 Other long term (current) drug therapy: Secondary | ICD-10-CM | POA: Diagnosis not present

## 2019-01-17 DIAGNOSIS — D631 Anemia in chronic kidney disease: Secondary | ICD-10-CM | POA: Diagnosis not present

## 2019-01-17 DIAGNOSIS — D509 Iron deficiency anemia, unspecified: Secondary | ICD-10-CM | POA: Diagnosis not present

## 2019-01-17 DIAGNOSIS — N185 Chronic kidney disease, stage 5: Secondary | ICD-10-CM | POA: Diagnosis not present

## 2019-01-17 DIAGNOSIS — R809 Proteinuria, unspecified: Secondary | ICD-10-CM | POA: Diagnosis not present

## 2019-01-24 ENCOUNTER — Telehealth: Payer: Self-pay | Admitting: Internal Medicine

## 2019-01-24 DIAGNOSIS — D631 Anemia in chronic kidney disease: Secondary | ICD-10-CM | POA: Diagnosis not present

## 2019-01-24 DIAGNOSIS — N185 Chronic kidney disease, stage 5: Secondary | ICD-10-CM | POA: Diagnosis not present

## 2019-01-24 DIAGNOSIS — D509 Iron deficiency anemia, unspecified: Secondary | ICD-10-CM | POA: Diagnosis not present

## 2019-01-24 DIAGNOSIS — R809 Proteinuria, unspecified: Secondary | ICD-10-CM | POA: Diagnosis not present

## 2019-01-24 DIAGNOSIS — Z79899 Other long term (current) drug therapy: Secondary | ICD-10-CM | POA: Diagnosis not present

## 2019-01-24 NOTE — Telephone Encounter (Signed)
New Message  Pt said wife will accompany him on his visit due to difficulty walking  Please call

## 2019-01-24 NOTE — Telephone Encounter (Signed)
Will document this on apt comments, patient aware.

## 2019-01-28 ENCOUNTER — Other Ambulatory Visit: Payer: Self-pay

## 2019-01-29 ENCOUNTER — Ambulatory Visit (INDEPENDENT_AMBULATORY_CARE_PROVIDER_SITE_OTHER): Payer: Medicare Other | Admitting: Family Medicine

## 2019-01-29 ENCOUNTER — Encounter: Payer: Self-pay | Admitting: Family Medicine

## 2019-01-29 ENCOUNTER — Encounter: Payer: Self-pay | Admitting: Internal Medicine

## 2019-01-29 ENCOUNTER — Ambulatory Visit: Payer: Medicare Other | Admitting: Internal Medicine

## 2019-01-29 ENCOUNTER — Other Ambulatory Visit: Payer: Self-pay | Admitting: Internal Medicine

## 2019-01-29 VITALS — BP 168/54 | HR 65 | Temp 98.9°F | Resp 20 | Ht 73.0 in | Wt 233.0 lb

## 2019-01-29 VITALS — BP 167/61 | HR 63 | Temp 98.4°F | Ht 73.0 in | Wt 233.0 lb

## 2019-01-29 DIAGNOSIS — Z951 Presence of aortocoronary bypass graft: Secondary | ICD-10-CM

## 2019-01-29 DIAGNOSIS — N185 Chronic kidney disease, stage 5: Secondary | ICD-10-CM

## 2019-01-29 DIAGNOSIS — E039 Hypothyroidism, unspecified: Secondary | ICD-10-CM | POA: Diagnosis not present

## 2019-01-29 DIAGNOSIS — I10 Essential (primary) hypertension: Secondary | ICD-10-CM | POA: Diagnosis not present

## 2019-01-29 DIAGNOSIS — I5042 Chronic combined systolic (congestive) and diastolic (congestive) heart failure: Secondary | ICD-10-CM | POA: Diagnosis not present

## 2019-01-29 DIAGNOSIS — Z23 Encounter for immunization: Secondary | ICD-10-CM | POA: Diagnosis not present

## 2019-01-29 DIAGNOSIS — N138 Other obstructive and reflux uropathy: Secondary | ICD-10-CM

## 2019-01-29 DIAGNOSIS — E119 Type 2 diabetes mellitus without complications: Secondary | ICD-10-CM | POA: Diagnosis not present

## 2019-01-29 DIAGNOSIS — I110 Hypertensive heart disease with heart failure: Secondary | ICD-10-CM

## 2019-01-29 DIAGNOSIS — N401 Enlarged prostate with lower urinary tract symptoms: Secondary | ICD-10-CM | POA: Diagnosis not present

## 2019-01-29 MED ORDER — FUROSEMIDE 80 MG PO TABS: ORAL_TABLET | ORAL | 2 refills | Status: DC

## 2019-01-29 NOTE — Patient Instructions (Signed)
Medication Instructions:  Your physician recommends that you continue on your current medications as directed. Please refer to the Current Medication list given to you today.  *If you need a refill on your cardiac medications before your next appointment, please call your pharmacy*  Follow-Up: At [facility], you and your health needs are our priority.  As part of our continuing mission to provide you with exceptional heart care, we have created designated Provider Care Teams.  These Care Teams include your primary Cardiologist (physician) and Advanced Practice Providers (APPs -  Physician Assistants and Nurse Practitioners) who all work together to provide you with the care you need, when you need it.  Your next appointment:   12 month(s)  The format for your next appointment:   Either In Person or Virtual  Provider:   You may see Pixie Casino, MD or one of the following Advanced Practice Providers on your designated Care Team:    Almyra Deforest, PA-C  Fabian Sharp, PA-C or   Roby Lofts, [place]   Other Instructions

## 2019-01-29 NOTE — Progress Notes (Signed)
Subjective:  Patient ID: [name]., male    DOB: 03-Sep-1945  Age: 73 y.o. MRN: [id number]  CC: Medical Management of Chronic Issues (6 week follow up )   HPI [name]. presents for  follow-up of hypertension. Patient has no history of headache chest pain or shortness of breath or recent cough. Patient also denies symptoms of TIA such as focal numbness or weakness. Patient denies side effects from medication. States taking it regularly. Also has Had CABG in 2018. Hx of systolic and diastolic heart failure.  Swelling stable, but not better. Left leg wound almost healed. He is not dyspneic, just moving slowly. Fatigued - possibly from the renal insufficiency and secondary anemia.    History Umair has a past medical history of Acute blood loss anemia, Acute pulmonary edema ([place]), CAD (coronary artery disease), native coronary artery, Carotid artery occlusion, Chronic kidney disease, Diabetes mellitus without complication ([place]), Hypertension, NSTEMI (non-ST elevated myocardial infarction) ([place]) (07/25/2016), and S/P CABG (coronary artery bypass graft).   He has a past surgical history that includes Lumbar fusion (2005); LEFT HEART CATH AND CORONARY ANGIOGRAPHY (N/A, 07/28/2016); Coronary artery bypass graft (N/A, 08/05/2016); TEE without cardioversion (N/A, 08/05/2016); Joint replacement (Bilateral); Appendectomy; and Tonsilectomy, adenoidectomy, bilateral myringotomy and tubes.   His family history includes Arthritis in his father, maternal grandfather, maternal grandmother, mother, paternal grandfather, and paternal grandmother; Asthma in his sister; Cancer in his brother, brother, father, and mother; Diabetes in his brother, father, and mother; Heart attack in his son; Heart disease in his mother; Hyperlipidemia in his brother, brother, brother, father, mother, sister, and sister; Hypertension in his brother, brother, brother, father, mother, sister, and sister; Kidney disease in his  mother; Vision loss in his sister.He reports that he quit smoking about 40 years ago. He has never used smokeless tobacco. He reports that he does not drink alcohol or use drugs.  Current Outpatient Medications on File Prior to Visit  Medication Sig Dispense Refill  . acetaminophen (TYLENOL) 325 MG tablet Take 2 tablets (650 mg total) by mouth every 6 (six) hours as needed for mild pain.    Marland Kitchen allopurinol (ZYLOPRIM) 100 MG tablet TAKE 1 TABLET BY MOUTH EVERY DAY 90 tablet 1  . AMITIZA 24 MCG capsule Take 1 capsule (24 mcg total) by mouth 2 (two) times daily. 180 capsule 1  . amLODipine (NORVASC) 10 MG tablet TAKE 1 TABLET BY MOUTH EVERY DAY 90 tablet 1  . aspirin 81 MG EC tablet Take 81 mg by mouth daily. Swallow whole.    . betamethasone valerate (VALISONE) 0.1 % cream USE AS DIRECTED 45 g 3  . carvedilol (COREG) 25 MG tablet TAKE 1 & 1/2 TABLET BY MOUTH TWICE A DAY 270 tablet 3  . cloNIDine (CATAPRES) 0.1 MG tablet TAKE 2 TABLETS BY MOUTH 3 TIMES A DAY. Please make yearly appt with Dr. Debara Pickett for November for future refills. 1st attempt 540 tablet 0  . doxazosin (CARDURA) 4 MG tablet Take 1 tablet (4 mg total) by mouth 2 (two) times daily. 180 tablet 0  . doxycycline (VIBRAMYCIN) 100 MG capsule Take 1 capsule (100 mg total) by mouth 2 (two) times daily. 20 capsule 0  . ferrous sulfate 325 (65 FE) MG tablet Take 1 tablet (325 mg total) by mouth daily with breakfast.  3  . finasteride (PROSCAR) 5 MG tablet Take 5 mg by mouth daily.  3  . fluticasone (FLONASE) 50 MCG/ACT nasal spray USE 2 SPRAYS IN [facility]  NOSTRIL AT BEDTIME 48 mL 1  . gabapentin (NEURONTIN) 100 MG capsule TAKE 1 CAPSULE (100 MG TOTAL) BY MOUTH THREE (3) TIMES A DAY. 270 capsule 3  . glucose blood (ONE TOUCH ULTRA TEST) test strip Use as instructed 100 each 12  . hydrALAZINE (APRESOLINE) 100 MG tablet TAKE 1 TABLET (100 MG TOTAL) BY MOUTH 4 (FOUR) TIMES DAILY - BEFORE MEALS AND AT BEDTIME. 360 tablet 3  . Insulin Glargine (LANTUS  SOLOSTAR) 100 UNIT/ML Solostar Pen Inject 12 Units into the skin daily. At 7 am 15 pen 1  . isosorbide dinitrate (ISORDIL) 20 MG tablet TAKE 2 TABLETS (40 MG TOTAL) BY MOUTH 3 (THREE) TIMES DAILY. 540 tablet 0  . levothyroxine (SYNTHROID) 100 MCG tablet Take 1 tablet (100 mcg total) by mouth daily. 90 tablet 1  . rosuvastatin (CRESTOR) 20 MG tablet Take 1 tablet (20 mg total) by mouth every evening. 90 tablet 1  . tamsulosin (FLOMAX) 0.4 MG CAPS capsule Take 0.8 mg by mouth daily.  11   No current facility-administered medications on file prior to visit.    ROS Review of Systems  Constitutional: Negative for fever.  HENT: Negative.   Respiratory: Negative for cough and shortness of breath.   Cardiovascular: Positive for leg swelling. Negative for chest pain.  Musculoskeletal: Positive for arthralgias and myalgias.  Skin: Negative for rash.    Objective:  BP (!) 168/54 (BP Location: Left Arm, Cuff Size: Large)   Pulse 65   Temp 98.9 F (37.2 C)   Resp 20   Ht 6\' 1"  (1.854 m)   Wt 233 lb (105.7 kg)   SpO2 99%   BMI 30.74 kg/m   BP Readings from Last 3 Encounters:  01/29/19 (!) 168/54  12/19/18 (!) 187/71  11/08/18 (!) 175/63    Wt Readings from Last 3 Encounters:  01/29/19 233 lb (105.7 kg)  12/19/18 235 lb 12.8 oz (107 kg)  11/08/18 240 lb (108.9 kg)     Physical Exam Vitals reviewed.  Constitutional:      Appearance: He is well-developed.  HENT:     Head: Normocephalic and atraumatic.     Right Ear: External ear normal.     Left Ear: External ear normal.     Mouth/Throat:     Pharynx: No oropharyngeal exudate or posterior oropharyngeal erythema.  Eyes:     Pupils: Pupils are equal, round, and reactive to light.  Cardiovascular:     Rate and Rhythm: Normal rate and regular rhythm.     Heart sounds: No murmur.  Pulmonary:     Effort: No respiratory distress.     Breath sounds: Normal breath sounds.  Musculoskeletal:     Cervical back: Normal range of  motion and neck supple.  Neurological:     Mental Status: He is alert and oriented to person, place, and time.     Gait: Gait abnormal (using a walker).       Assessment & Plan:   Larri was seen today for medical management of chronic issues.  Diagnoses and all orders for this visit:  Essential hypertension -     CBC -     CMP  BPH with obstruction/lower urinary tract symptoms  Hypothyroidism, unspecified type -     CBC -     CMP  Hypertensive heart disease with chronic combined systolic and diastolic congestive heart failure (HCC) -     CBC -     CMP  Need for immunization against influenza -  Flu Vaccine QUAD High Dose(Fluad)  Diabetes mellitus type 2 in nonobese (HCC) -     CBC -     CMP  CKD (chronic kidney disease) stage 5, GFR less than 15 ml/min (HCC) -     CBC -     CMP  Other orders -     furosemide (LASIX) 80 MG tablet; Two on awakening in the morning and one at lunchtime   Allergies as of 01/29/2019      Reactions   Sulfa Antibiotics Hives      Medication List       Accurate as of January 29, 2019 10:17 AM. If you have any questions, ask your nurse or doctor.        acetaminophen 325 MG tablet Commonly known as: TYLENOL Take 2 tablets (650 mg total) by mouth every 6 (six) hours as needed for mild pain.   allopurinol 100 MG tablet Commonly known as: ZYLOPRIM TAKE 1 TABLET BY MOUTH EVERY DAY   Amitiza 24 MCG capsule Generic drug: lubiprostone Take 1 capsule (24 mcg total) by mouth 2 (two) times daily.   amLODipine 10 MG tablet Commonly known as: NORVASC TAKE 1 TABLET BY MOUTH EVERY DAY   aspirin 81 MG EC tablet Take 81 mg by mouth daily. Swallow whole.   betamethasone valerate 0.1 % cream Commonly known as: VALISONE USE AS DIRECTED   carvedilol 25 MG tablet Commonly known as: COREG TAKE 1 & 1/2 TABLET BY MOUTH TWICE A DAY   cloNIDine 0.1 MG tablet Commonly known as: CATAPRES TAKE 2 TABLETS BY MOUTH 3 TIMES A DAY.  Please make yearly appt with Dr. Debara Pickett for November for future refills. 1st attempt   doxazosin 4 MG tablet Commonly known as: CARDURA Take 1 tablet (4 mg total) by mouth 2 (two) times daily.   doxycycline 100 MG capsule Commonly known as: Vibramycin Take 1 capsule (100 mg total) by mouth 2 (two) times daily.   ferrous sulfate 325 (65 FE) MG tablet Take 1 tablet (325 mg total) by mouth daily with breakfast.   finasteride 5 MG tablet Commonly known as: PROSCAR Take 5 mg by mouth daily.   fluticasone 50 MCG/ACT nasal spray Commonly known as: FLONASE USE 2 SPRAYS IN EACH NOSTRIL AT BEDTIME   furosemide 80 MG tablet Commonly known as: LASIX Two on awakening in the morning and one at lunchtime What changed:   medication strength  additional instructions Changed by: Claretta Fraise, MD   gabapentin 100 MG capsule Commonly known as: NEURONTIN TAKE 1 CAPSULE (100 MG TOTAL) BY MOUTH THREE (3) TIMES A DAY.   glucose blood test strip Commonly known as: ONE TOUCH ULTRA TEST Use as instructed   hydrALAZINE 100 MG tablet Commonly known as: APRESOLINE TAKE 1 TABLET (100 MG TOTAL) BY MOUTH 4 (FOUR) TIMES DAILY - BEFORE MEALS AND AT BEDTIME.   isosorbide dinitrate 20 MG tablet Commonly known as: ISORDIL TAKE 2 TABLETS (40 MG TOTAL) BY MOUTH 3 (THREE) TIMES DAILY.   Lantus SoloStar 100 UNIT/ML Solostar Pen Generic drug: Insulin Glargine Inject 12 Units into the skin daily. At 7 am   levothyroxine 100 MCG tablet Commonly known as: SYNTHROID Take 1 tablet (100 mcg total) by mouth daily.   rosuvastatin 20 MG tablet Commonly known as: CRESTOR Take 1 tablet (20 mg total) by mouth every evening.   tamsulosin 0.4 MG Caps capsule Commonly known as: FLOMAX Take 0.8 mg by mouth daily.       Meds ordered  this encounter  Medications  . furosemide (LASIX) 80 MG tablet    Sig: Two on awakening in the morning and one at lunchtime    Dispense:  90 tablet    Refill:  2       Follow-up: Return in about 6 weeks (around 03/12/2019).  Claretta Fraise, M.D.

## 2019-01-29 NOTE — Progress Notes (Signed)
OFFICE NOTE  Chief Complaint:  Follow-up  Primary Care Physician: Claretta Fraise, MD  HPI:  [name]. is a 73 y.o. male with a past medial history significant for hypertension, IDDM, BPH, prior herniated disk in 2005 sp L3/L4 diskectomy and fusion, mild bilateral ICA stenosis in 2014 and EF 60-65% with moderate LVH by echo in 2014, there was focal distal anterior and anteroseptal thinning and akinesis, diastolic dysfunction and mild to moderate pulmonary hypertension at the time. He also has CKD with creatinine of 2.65 in 2015 (now 3.83) -sees Dr. Hinda Lenis in [place]. He now presented today to [facility] with complaints of chest pain since last Saturday (started in the abdomen and worked up to the chest) and progressive dyspnea, ultimately brought in by EMS. He has required BIPAP and was weaned off prior to transport, but then restarted when he arrived here for high O2 requirement. Labs significant for Creatinine of 3.83 (GFR 19), Troponin T of 0.26, BNP of 19,808, anemia with H/H 9.1/27.5, glucose of 261, no leukocytosis. CXR shows mild cardiomegaly, CHF/consolidation - suspect pneumonia with superimposed pulmonary edema/ARDS, EKG shows NSR at 76 (personally reviewed) with anterior and lateral ST depression.  He ultimately underwent heart catheterization found to have multivessel coronary disease and then subsequently had three-vessel bypass with LIMA to LAD, SVG to diagonal and SVG to RCA on 08/05/16.  Since then, he was seen in follow-up by Dr. Oval Linsey and noted to be euvolemic on Lasix.  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  Clonidine 0.1 mg 3 times daily was added.  Subsequently he has had a number of hypertension clinic follow-up appointments and blood pressure remains elevated.  Overall he feels well, though.  12/15/2016  [name] has no new complaints today.  Blood pressure remains elevated.  He denies any worsening shortness of  breath, weight gain or lower extremity edema.  Fortunately he is not on dialysis.  01/30/2017  [name] returns today for follow-up.  He says his blood pressures appeared somewhat better with 3 times daily clonidine.  He occasionally misses his night dose and was not taking it before going to bed which is usually around 8:00.  He says he wakes up at about 11:45 to go to the bathroom and then sometimes remembers to take it then, but has trouble falling back asleep.  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  With regards to this he is asymptomatic and denies any abdominal pain.  07/17/2017  [name] returns today for follow-up.  He denies any worsening chest pain or shortness of breath.  Blood pressure had been elevated however recently he has been on additional medication including hydralazine 100 mg 4 times daily.  He is ready on clonidine 0.2 mg 3 times daily, along with amlodipine and carvedilol.  Blood pressure is better controlled on this regimen.  Unfortunately he has chronic kidney disease.  At some point recently he was counseled on having a fistula placed however he has declined.  Weight is down about 6 pounds since I last saw him.  He also reports taking very high-dose aspirin for arthritis.  In fact he takes 500 mg every 6 hours.  He was not aware that this could adversely affect his kidney function.  01/09/2018  [name] returns today for follow-up.  He continues to have borderline end-stage renal disease but is  not yet wanted to have a fistula placed.  He is followed by Dr. Lowanda Foster.  He has not started on dialysis.  He did have recent labs by his PCP recently a month ago that showed total cholesterol 107, triglycerides 105, HDL 29 and LDL of 57.  He is on high intensity statin.  Glucose was 192 and hemoglobin A1c was 7.6 improved from 10.1 about 7 months ago.  01/29/2019  [name] is seen today  in follow-up.  Overall he is fairly stable.  He was seen by his PCP earlier this morning.  He is now transition care from Dr. Lowanda Foster over to Dr. Theador Hawthorne.  Apparently he is retired.  He still does not desire dialysis and feels that if his kidney function were to worsen significantly he would still refuse dialysis.  Overall from a cardiac standpoint he seems to be doing well now 2 years out of bypass surgery.  He is on low-dose aspirin and statin as well as beta-blocker.  Blood pressure is uncontrolled however due to advanced renal disease is not likely to improve despite being on multiple medications at high doses.  PMHx:  Past Medical History:  Diagnosis Date  . Acute blood loss anemia   . Acute pulmonary edema (HCC)   . CAD (coronary artery disease), native coronary artery    s/p NSTEMI and CABG x 3 (LIMA to LAD, SVG to diagonal, SVG to dRCA) on 08/05/16  . Carotid artery occlusion   . Chronic kidney disease   . Diabetes mellitus without complication ([place])   . Hypertension   . NSTEMI (non-ST elevated myocardial infarction) ([place]) 07/25/2016  . S/P CABG (coronary artery bypass graft)     Past Surgical History:  Procedure Laterality Date  . APPENDECTOMY    . CORONARY ARTERY BYPASS GRAFT N/A 08/05/2016   Procedure: CORONARY ARTERY BYPASS GRAFTING (CABG) x 3, LIMA to LAD, SVG to DIAGONAL, SVG to OM1, SVG to DISTAL RCA, USING LEFT MAMMARY ARTERY AND RIGHT GREATER SAPHENOUS VEIN HARVESTED ENDOSCOPICALLY;  Surgeon: [name], MD;  Location: [facility];  Service: Open Heart Surgery;  Laterality: N/A;  . JOINT REPLACEMENT Bilateral    hip replacements  . LEFT HEART CATH AND CORONARY ANGIOGRAPHY N/A 07/28/2016   Procedure: Left Heart Cath and Coronary Angiography;  Surgeon: Jettie Booze, MD;  Location: [place] [facility];  Service: Cardiovascular;  Laterality: N/A;  . LUMBAR FUSION  2005  . TEE WITHOUT CARDIOVERSION N/A 08/05/2016   Procedure: TRANSESOPHAGEAL ECHOCARDIOGRAM (TEE);   Surgeon: [name], MD;  Location: [place];  Service: Open Heart Surgery;  Laterality: N/A;  . TONSILECTOMY, ADENOIDECTOMY, BILATERAL MYRINGOTOMY AND TUBES      FAMHx:  Family History  Problem Relation Age of Onset  . Heart disease Mother   . Hypertension Mother   . Arthritis Mother   . Cancer Mother   . Diabetes Mother   . Hyperlipidemia Mother   . Kidney disease Mother   . Hypertension Father   . Arthritis Father   . Cancer Father   . Diabetes Father   . Hyperlipidemia Father   . Hyperlipidemia Sister   . Hypertension Sister   . Vision loss Sister   . Hyperlipidemia Brother   . Hypertension Brother   . Arthritis Maternal Grandmother   . Arthritis Maternal Grandfather   . Arthritis Paternal Grandmother   . Arthritis Paternal Grandfather   . Asthma Sister   . Hyperlipidemia Sister   . Hypertension Sister   .  Cancer Brother   . Diabetes Brother   . Hyperlipidemia Brother   . Hypertension Brother   . Cancer Brother   . Hyperlipidemia Brother   . Hypertension Brother   . Heart attack Son        25    SOCHx:   reports that he quit smoking about 40 years ago. He has never used smokeless tobacco. He reports that he does not drink alcohol or use drugs.  ALLERGIES:  Allergies  Allergen Reactions  . Sulfa Antibiotics Hives    ROS: Pertinent items noted in HPI and remainder of comprehensive ROS otherwise negative.  HOME MEDS: Current Outpatient Medications on File Prior to Visit  Medication Sig Dispense Refill  . acetaminophen (TYLENOL) 325 MG tablet Take 2 tablets (650 mg total) by mouth every 6 (six) hours as needed for mild pain.    Marland Kitchen allopurinol (ZYLOPRIM) 100 MG tablet TAKE 1 TABLET BY MOUTH EVERY DAY 90 tablet 1  . AMITIZA 24 MCG capsule Take 1 capsule (24 mcg total) by mouth 2 (two) times daily. 180 capsule 1  . amLODipine (NORVASC) 10 MG tablet TAKE 1 TABLET BY MOUTH EVERY DAY 90 tablet 1  . aspirin 81 MG EC tablet Take 81 mg by mouth daily. Swallow  whole.    . betamethasone valerate (VALISONE) 0.1 % cream USE AS DIRECTED 45 g 3  . carvedilol (COREG) 25 MG tablet TAKE 1 & 1/2 TABLET BY MOUTH TWICE A DAY 270 tablet 3  . cloNIDine (CATAPRES) 0.1 MG tablet TAKE 2 TABLETS BY MOUTH 3 TIMES A DAY. Please make yearly appt with Dr. Debara Pickett for November for future refills. 1st attempt 540 tablet 0  . doxazosin (CARDURA) 4 MG tablet Take 1 tablet (4 mg total) by mouth 2 (two) times daily. 180 tablet 0  . doxycycline (VIBRAMYCIN) 100 MG capsule Take 1 capsule (100 mg total) by mouth 2 (two) times daily. 20 capsule 0  . ferrous sulfate 325 (65 FE) MG tablet Take 1 tablet (325 mg total) by mouth daily with breakfast.  3  . finasteride (PROSCAR) 5 MG tablet Take 5 mg by mouth daily.  3  . fluticasone (FLONASE) 50 MCG/ACT nasal spray USE 2 SPRAYS IN EACH NOSTRIL AT BEDTIME 48 mL 1  . furosemide (LASIX) 80 MG tablet Two on awakening in the morning and one at lunchtime 90 tablet 2  . gabapentin (NEURONTIN) 100 MG capsule TAKE 1 CAPSULE (100 MG TOTAL) BY MOUTH THREE (3) TIMES A DAY. 270 capsule 3  . glucose blood (ONE TOUCH ULTRA TEST) test strip Use as instructed 100 each 12  . hydrALAZINE (APRESOLINE) 100 MG tablet TAKE 1 TABLET (100 MG TOTAL) BY MOUTH 4 (FOUR) TIMES DAILY - BEFORE MEALS AND AT BEDTIME. 360 tablet 3  . Insulin Glargine (LANTUS SOLOSTAR) 100 UNIT/ML Solostar Pen Inject 12 Units into the skin daily. At 7 am 15 pen 1  . isosorbide dinitrate (ISORDIL) 20 MG tablet TAKE 2 TABLETS (40 MG TOTAL) BY MOUTH 3 (THREE) TIMES DAILY. 540 tablet 0  . levothyroxine (SYNTHROID) 100 MCG tablet Take 1 tablet (100 mcg total) by mouth daily. 90 tablet 1  . rosuvastatin (CRESTOR) 20 MG tablet Take 1 tablet (20 mg total) by mouth every evening. 90 tablet 1  . tamsulosin (FLOMAX) 0.4 MG CAPS capsule Take 0.8 mg by mouth daily.  11   No current facility-administered medications on file prior to visit.    LABS/IMAGING: No results found for this or any previous  visit (from the past 48 hour(s)). No results found.  LIPID PANEL:    Component Value Date/Time   CHOL 134 12/19/2018 1049   TRIG 89 12/19/2018 1049   HDL 28 (L) 12/19/2018 1049   CHOLHDL 4.8 12/19/2018 1049   CHOLHDL 2.6 07/26/2016 0022   VLDL 16 07/26/2016 0022   LDLCALC 89 12/19/2018 1049     WEIGHTS: Wt Readings from Last 3 Encounters:  01/29/19 233 lb (105.7 kg)  01/29/19 233 lb (105.7 kg)  12/19/18 235 lb 12.8 oz (107 kg)    VITALS: BP (!) 167/61   Pulse 63   Temp 98.4 F (36.9 C)   Ht 6\' 1"  (1.854 m)   Wt 233 lb (105.7 kg) Comment: wheelchair  SpO2 96%   BMI 30.74 kg/m   EXAM: General appearance: alert and no distress Neck: no carotid bruit, no JVD and thyroid not enlarged, symmetric, no tenderness/mass/nodules Lungs: clear to auscultation bilaterally Heart: regular rate and rhythm, S1, S2 normal, no murmur, click, rub or gallop Abdomen: soft, non-tender; bowel sounds normal; no masses,  no organomegaly Extremities: extremities normal, atraumatic, no cyanosis or edema Pulses: 2+ and symmetric Skin: Skin color, texture, turgor normal. No rashes or lesions Neurologic: Grossly normal Psych: Pleasant  EKG: Sinus rhythm first-degree AV block at 60, lateral ST and T wave changes-personally reviewed  ASSESSMENT: 1. CAD status post three-vessel CABG (07/2016) 2. Acute on chronic systolic and diastolic congestive heart failure 3. Hypertensive heart disease 4. Dyslipidemia 5. PAD 6. CKD 5-refuses dialysis  PLAN: 1.   [name] is stable with likely CKD 5 however he refuses dialysis.  He will not pursue a dialysis catheter and understands that if his renal function worsens and he becomes uremic that he will likely die.  He denies any chest pain or worsening symptoms related to his coronary bypass grafting 2 years ago.  Blood pressure is not well controlled however his chronic kidney disease is contributing to this.  He already is on maximal doses of multiple  medications.  No changes today.  Plan follow-up annually or sooner as necessary.  Pixie Casino, MD, [facility] [place] Director of the Advanced Lipid Disorders &  Cardiovascular Risk Reduction Clinic Diplomate of the American Board of Clinical Lipidology Attending Cardiologist  Direct Dial: [phone]  Fax: [phone]  Website:  [email or web address] 01/29/2019, 4:15 PM

## 2019-01-30 LAB — CBC WITH DIFFERENTIAL/PLATELET
Basophils Absolute: 0 10*3/uL (ref 0.0–0.2)
Basos: 1 %
EOS (ABSOLUTE): 0.1 10*3/uL (ref 0.0–0.4)
Eos: 3 %
Hematocrit: 27.3 % — ABNORMAL LOW (ref 37.5–51.0)
Hemoglobin: 8.7 g/dL — CL (ref 13.0–17.7)
Immature Grans (Abs): 0 10*3/uL (ref 0.0–0.1)
Immature Granulocytes: 1 %
Lymphocytes Absolute: 0.5 10*3/uL — ABNORMAL LOW (ref 0.7–3.1)
Lymphs: 11 %
MCH: 29.9 pg (ref 26.6–33.0)
MCHC: 31.9 g/dL (ref 31.5–35.7)
MCV: 94 fL (ref 79–97)
Monocytes Absolute: 0.3 10*3/uL (ref 0.1–0.9)
Monocytes: 7 %
Neutrophils Absolute: 3.4 10*3/uL (ref 1.4–7.0)
Neutrophils: 77 %
Platelets: 126 10*3/uL — ABNORMAL LOW (ref 150–450)
RBC: 2.91 x10E6/uL — ABNORMAL LOW (ref 4.14–5.80)
RDW: 14.8 % (ref 11.6–15.4)
WBC: 4.3 10*3/uL (ref 3.4–10.8)

## 2019-01-30 LAB — CMP14+EGFR
ALT: 8 IU/L (ref 0–44)
AST: 11 IU/L (ref 0–40)
Albumin/Globulin Ratio: 1.1 — ABNORMAL LOW (ref 1.2–2.2)
Albumin: 3.9 g/dL (ref 3.7–4.7)
Alkaline Phosphatase: 83 IU/L (ref 39–117)
BUN/Creatinine Ratio: 23 (ref 10–24)
BUN: 102 mg/dL (ref 8–27)
Bilirubin Total: 0.4 mg/dL (ref 0.0–1.2)
CO2: 16 mmol/L — ABNORMAL LOW (ref 20–29)
Calcium: 9.3 mg/dL (ref 8.6–10.2)
Chloride: 99 mmol/L (ref 96–106)
Creatinine, Ser: 4.4 mg/dL (ref 0.76–1.27)
GFR calc Af Amer: 14 mL/min/{1.73_m2} — ABNORMAL LOW (ref >59)
GFR calc non Af Amer: 12 mL/min/{1.73_m2} — ABNORMAL LOW (ref >59)
Globulin, Total: 3.7 g/dL (ref 1.5–4.5)
Glucose: 206 mg/dL — ABNORMAL HIGH (ref 65–99)
Potassium: 4.1 mmol/L (ref 3.5–5.2)
Sodium: 134 mmol/L (ref 134–144)
Total Protein: 7.6 g/dL (ref 6.0–8.5)

## 2019-01-31 DIAGNOSIS — D631 Anemia in chronic kidney disease: Secondary | ICD-10-CM | POA: Diagnosis not present

## 2019-01-31 DIAGNOSIS — N185 Chronic kidney disease, stage 5: Secondary | ICD-10-CM | POA: Diagnosis not present

## 2019-01-31 DIAGNOSIS — D509 Iron deficiency anemia, unspecified: Secondary | ICD-10-CM | POA: Diagnosis not present

## 2019-01-31 DIAGNOSIS — Z79899 Other long term (current) drug therapy: Secondary | ICD-10-CM | POA: Diagnosis not present

## 2019-01-31 DIAGNOSIS — R809 Proteinuria, unspecified: Secondary | ICD-10-CM | POA: Diagnosis not present

## 2019-02-02 ENCOUNTER — Other Ambulatory Visit: Payer: Self-pay | Admitting: Internal Medicine

## 2019-02-06 DIAGNOSIS — D631 Anemia in chronic kidney disease: Secondary | ICD-10-CM | POA: Diagnosis not present

## 2019-02-06 DIAGNOSIS — R809 Proteinuria, unspecified: Secondary | ICD-10-CM | POA: Diagnosis not present

## 2019-02-06 DIAGNOSIS — Z79899 Other long term (current) drug therapy: Secondary | ICD-10-CM | POA: Diagnosis not present

## 2019-02-06 DIAGNOSIS — N185 Chronic kidney disease, stage 5: Secondary | ICD-10-CM | POA: Diagnosis not present

## 2019-02-06 DIAGNOSIS — D509 Iron deficiency anemia, unspecified: Secondary | ICD-10-CM | POA: Diagnosis not present

## 2019-02-14 DIAGNOSIS — R809 Proteinuria, unspecified: Secondary | ICD-10-CM | POA: Diagnosis not present

## 2019-02-14 DIAGNOSIS — N185 Chronic kidney disease, stage 5: Secondary | ICD-10-CM | POA: Diagnosis not present

## 2019-02-14 DIAGNOSIS — D509 Iron deficiency anemia, unspecified: Secondary | ICD-10-CM | POA: Diagnosis not present

## 2019-02-14 DIAGNOSIS — Z79899 Other long term (current) drug therapy: Secondary | ICD-10-CM | POA: Diagnosis not present

## 2019-02-14 DIAGNOSIS — D631 Anemia in chronic kidney disease: Secondary | ICD-10-CM | POA: Diagnosis not present

## 2019-02-20 DIAGNOSIS — D631 Anemia in chronic kidney disease: Secondary | ICD-10-CM | POA: Diagnosis not present

## 2019-02-20 DIAGNOSIS — R600 Localized edema: Secondary | ICD-10-CM | POA: Diagnosis not present

## 2019-02-20 DIAGNOSIS — I129 Hypertensive chronic kidney disease with stage 1 through stage 4 chronic kidney disease, or unspecified chronic kidney disease: Secondary | ICD-10-CM | POA: Diagnosis not present

## 2019-02-20 DIAGNOSIS — R809 Proteinuria, unspecified: Secondary | ICD-10-CM | POA: Diagnosis not present

## 2019-02-20 DIAGNOSIS — N189 Chronic kidney disease, unspecified: Secondary | ICD-10-CM | POA: Diagnosis not present

## 2019-02-21 DIAGNOSIS — D509 Iron deficiency anemia, unspecified: Secondary | ICD-10-CM | POA: Diagnosis not present

## 2019-02-21 DIAGNOSIS — E559 Vitamin D deficiency, unspecified: Secondary | ICD-10-CM | POA: Diagnosis not present

## 2019-02-21 DIAGNOSIS — Z79899 Other long term (current) drug therapy: Secondary | ICD-10-CM | POA: Diagnosis not present

## 2019-02-21 DIAGNOSIS — N185 Chronic kidney disease, stage 5: Secondary | ICD-10-CM | POA: Diagnosis not present

## 2019-02-21 DIAGNOSIS — R809 Proteinuria, unspecified: Secondary | ICD-10-CM | POA: Diagnosis not present

## 2019-02-21 DIAGNOSIS — D631 Anemia in chronic kidney disease: Secondary | ICD-10-CM | POA: Diagnosis not present

## 2019-02-28 ENCOUNTER — Encounter: Payer: Self-pay | Admitting: Family Medicine

## 2019-02-28 DIAGNOSIS — D509 Iron deficiency anemia, unspecified: Secondary | ICD-10-CM | POA: Diagnosis not present

## 2019-02-28 DIAGNOSIS — N185 Chronic kidney disease, stage 5: Secondary | ICD-10-CM | POA: Diagnosis not present

## 2019-02-28 DIAGNOSIS — Z79899 Other long term (current) drug therapy: Secondary | ICD-10-CM | POA: Diagnosis not present

## 2019-02-28 DIAGNOSIS — E559 Vitamin D deficiency, unspecified: Secondary | ICD-10-CM | POA: Diagnosis not present

## 2019-02-28 DIAGNOSIS — R809 Proteinuria, unspecified: Secondary | ICD-10-CM | POA: Diagnosis not present

## 2019-02-28 DIAGNOSIS — D631 Anemia in chronic kidney disease: Secondary | ICD-10-CM | POA: Diagnosis not present

## 2019-03-01 DIAGNOSIS — N185 Chronic kidney disease, stage 5: Secondary | ICD-10-CM | POA: Diagnosis not present

## 2019-03-01 DIAGNOSIS — D509 Iron deficiency anemia, unspecified: Secondary | ICD-10-CM | POA: Diagnosis not present

## 2019-03-07 DIAGNOSIS — N185 Chronic kidney disease, stage 5: Secondary | ICD-10-CM | POA: Diagnosis not present

## 2019-03-07 DIAGNOSIS — D509 Iron deficiency anemia, unspecified: Secondary | ICD-10-CM | POA: Diagnosis not present

## 2019-03-07 DIAGNOSIS — D631 Anemia in chronic kidney disease: Secondary | ICD-10-CM | POA: Diagnosis not present

## 2019-03-07 DIAGNOSIS — R809 Proteinuria, unspecified: Secondary | ICD-10-CM | POA: Diagnosis not present

## 2019-03-07 DIAGNOSIS — E559 Vitamin D deficiency, unspecified: Secondary | ICD-10-CM | POA: Diagnosis not present

## 2019-03-07 DIAGNOSIS — Z79899 Other long term (current) drug therapy: Secondary | ICD-10-CM | POA: Diagnosis not present

## 2019-03-08 DIAGNOSIS — D509 Iron deficiency anemia, unspecified: Secondary | ICD-10-CM | POA: Diagnosis not present

## 2019-03-08 DIAGNOSIS — N185 Chronic kidney disease, stage 5: Secondary | ICD-10-CM | POA: Diagnosis not present

## 2019-03-11 ENCOUNTER — Other Ambulatory Visit: Payer: Self-pay

## 2019-03-12 ENCOUNTER — Encounter: Payer: Self-pay | Admitting: Family Medicine

## 2019-03-12 ENCOUNTER — Ambulatory Visit (INDEPENDENT_AMBULATORY_CARE_PROVIDER_SITE_OTHER): Payer: Medicare Other | Admitting: Family Medicine

## 2019-03-12 VITALS — BP 139/64 | HR 63 | Temp 98.0°F | Ht 73.0 in | Wt 222.4 lb

## 2019-03-12 DIAGNOSIS — N184 Chronic kidney disease, stage 4 (severe): Secondary | ICD-10-CM

## 2019-03-12 DIAGNOSIS — N185 Chronic kidney disease, stage 5: Secondary | ICD-10-CM

## 2019-03-12 DIAGNOSIS — D508 Other iron deficiency anemias: Secondary | ICD-10-CM

## 2019-03-12 DIAGNOSIS — E119 Type 2 diabetes mellitus without complications: Secondary | ICD-10-CM | POA: Diagnosis not present

## 2019-03-12 DIAGNOSIS — I10 Essential (primary) hypertension: Secondary | ICD-10-CM

## 2019-03-12 DIAGNOSIS — E790 Hyperuricemia without signs of inflammatory arthritis and tophaceous disease: Secondary | ICD-10-CM

## 2019-03-12 DIAGNOSIS — N5089 Other specified disorders of the male genital organs: Secondary | ICD-10-CM

## 2019-03-12 DIAGNOSIS — I110 Hypertensive heart disease with heart failure: Secondary | ICD-10-CM

## 2019-03-12 DIAGNOSIS — R350 Frequency of micturition: Secondary | ICD-10-CM

## 2019-03-12 DIAGNOSIS — N401 Enlarged prostate with lower urinary tract symptoms: Secondary | ICD-10-CM

## 2019-03-12 DIAGNOSIS — Z23 Encounter for immunization: Secondary | ICD-10-CM

## 2019-03-12 DIAGNOSIS — I5042 Chronic combined systolic (congestive) and diastolic (congestive) heart failure: Secondary | ICD-10-CM

## 2019-03-12 DIAGNOSIS — E785 Hyperlipidemia, unspecified: Secondary | ICD-10-CM

## 2019-03-12 DIAGNOSIS — E1022 Type 1 diabetes mellitus with diabetic chronic kidney disease: Secondary | ICD-10-CM

## 2019-03-12 MED ORDER — LANTUS SOLOSTAR 100 UNIT/ML ~~LOC~~ SOPN: 12.0000 [IU] | PEN_INJECTOR | Freq: Every day | SUBCUTANEOUS | 1 refills | Status: DC

## 2019-03-12 MED ORDER — CARVEDILOL 25 MG PO TABS: ORAL_TABLET | ORAL | 3 refills | Status: AC

## 2019-03-12 MED ORDER — FLUTICASONE PROPIONATE 50 MCG/ACT NA SUSP: NASAL | 1 refills | Status: AC

## 2019-03-12 MED ORDER — DOXAZOSIN MESYLATE 4 MG PO TABS: 4.0000 mg | ORAL_TABLET | Freq: Two times a day (BID) | ORAL | 1 refills | Status: AC

## 2019-03-12 MED ORDER — GABAPENTIN 100 MG PO CAPS: ORAL_CAPSULE | ORAL | 3 refills | Status: AC

## 2019-03-12 MED ORDER — HYDRALAZINE HCL 100 MG PO TABS: 100.0000 mg | ORAL_TABLET | Freq: Three times a day (TID) | ORAL | 3 refills | Status: AC

## 2019-03-12 MED ORDER — FINASTERIDE 5 MG PO TABS: 5.0000 mg | ORAL_TABLET | Freq: Every day | ORAL | 3 refills | Status: AC

## 2019-03-12 MED ORDER — ROSUVASTATIN CALCIUM 20 MG PO TABS: 20.0000 mg | ORAL_TABLET | Freq: Every evening | ORAL | 1 refills | Status: AC

## 2019-03-12 MED ORDER — CLONIDINE HCL 0.1 MG PO TABS: ORAL_TABLET | ORAL | 1 refills | Status: AC

## 2019-03-12 MED ORDER — ISOSORBIDE DINITRATE 20 MG PO TABS: 40.0000 mg | ORAL_TABLET | Freq: Three times a day (TID) | ORAL | 2 refills | Status: AC

## 2019-03-12 MED ORDER — TAMSULOSIN HCL 0.4 MG PO CAPS: 0.8000 mg | ORAL_CAPSULE | Freq: Every day | ORAL | 11 refills | Status: AC

## 2019-03-12 MED ORDER — FUROSEMIDE 80 MG PO TABS: ORAL_TABLET | ORAL | 2 refills | Status: DC

## 2019-03-12 MED ORDER — AMLODIPINE BESYLATE 10 MG PO TABS: 10.0000 mg | ORAL_TABLET | Freq: Every day | ORAL | 1 refills | Status: AC

## 2019-03-12 MED ORDER — ALLOPURINOL 100 MG PO TABS: 100.0000 mg | ORAL_TABLET | Freq: Every day | ORAL | 1 refills | Status: AC

## 2019-03-12 NOTE — Progress Notes (Signed)
Subjective:  Patient ID: Jeff Ranks., male    DOB: September 03, 1945  Age: 74 y.o. MRN: [id number]  CC: Follow-up (6 weeks)   HPI Jeff Ranks. presents for follow-up on his chronic renal insufficiency.  At this time he is taking high-dose diuretics.  He is still having significant amount of swelling.  However he is not waking up in the night short of breath and does lay down to sleep.  The ulcer on the left leg has healed.  He says that he has been sluggish in the mornings.  He could not tolerate the iron so his renal specialist has been giving him iron infusions on Fridays for the last 2 weeks.  He believes that he will probably not need one this week.  Patient presents for follow-up on  thyroid. The patient has a history of hypothyroidism for many years. It has been stable recently. Pt. denies any change in  voice, loss of hair, heat or cold intolerance. Energy level has been adequate to good through the afternoon and evening, morning sluggishness noted as above seems more likely to be related to the anemia than to the thyroid however.. Patient denies constipation and diarrhea. No myxedema. Medication is as noted below. Verified that pt is taking it daily on an empty stomach. Well tolerated.  He started having scrotal swelling several days ago probably about 2 weeks he says.  It is uncomfortable but only minimally painful.  His urinary flow has been unchanged.  He is taking high doses of diuretics anyway and urinates frequently as result.  He has had not had any dysuria.  Depression screen [facility] 2/9 03/12/2019 01/29/2019 12/19/2018  Decreased Interest 0 0 0  Down, Depressed, Hopeless 0 0 0  PHQ - 2 Score 0 0 0  Altered sleeping - - -  Tired, decreased energy - - -  Change in appetite - - -  Feeling bad or failure about yourself  - - -  Trouble concentrating - - -  Moving slowly or fidgety/restless - - -  Suicidal thoughts - - -  PHQ-9 Score - - -  Difficult doing work/chores - - -     History Jonuel has a past medical history of Acute blood loss anemia, Acute pulmonary edema ([place]), CAD (coronary artery disease), native coronary artery, Carotid artery occlusion, Chronic kidney disease, Diabetes mellitus without complication ([place]), Hypertension, NSTEMI (non-ST elevated myocardial infarction) ([place]) (07/25/2016), and S/P CABG (coronary artery bypass graft).   He has a past surgical history that includes Lumbar fusion (2005); LEFT HEART CATH AND CORONARY ANGIOGRAPHY (N/A, 07/28/2016); Coronary artery bypass graft (N/A, 08/05/2016); TEE without cardioversion (N/A, 08/05/2016); Joint replacement (Bilateral); Appendectomy; and Tonsilectomy, adenoidectomy, bilateral myringotomy and tubes.   His family history includes Arthritis in his father, maternal grandfather, maternal grandmother, mother, paternal grandfather, and paternal grandmother; Asthma in his sister; Cancer in his brother, brother, father, and mother; Diabetes in his brother, father, and mother; Heart attack in his son; Heart disease in his mother; Hyperlipidemia in his brother, brother, brother, father, mother, sister, and sister; Hypertension in his brother, brother, brother, father, mother, sister, and sister; Kidney disease in his mother; Vision loss in his sister.He reports that he quit smoking about 41 years ago. He has never used smokeless tobacco. He reports that he does not drink alcohol or use drugs.    ROS Review of Systems  Constitutional: Negative.   HENT: Negative.   Eyes: Negative for visual disturbance.  Respiratory: Negative for  cough and shortness of breath.   Cardiovascular: Negative for chest pain and leg swelling.  Gastrointestinal: Negative for abdominal pain, diarrhea, nausea and vomiting.  Genitourinary: Positive for scrotal swelling and testicular pain. Negative for difficulty urinating and penile swelling.  Musculoskeletal: Negative for arthralgias and myalgias.  Skin: Negative for rash.   Neurological: Negative for headaches.  Psychiatric/Behavioral: Negative for sleep disturbance.    Objective:  BP 139/64   Pulse 63   Temp 98 F (36.7 C) (Temporal)   Ht 6' 1"  (1.854 m)   Wt 222 lb 6.4 oz (100.9 kg)   BMI 29.34 kg/m   BP Readings from Last 3 Encounters:  03/12/19 139/64  01/29/19 (!) 167/61  01/29/19 (!) 168/54    Wt Readings from Last 3 Encounters:  03/12/19 222 lb 6.4 oz (100.9 kg)  01/29/19 233 lb (105.7 kg)  01/29/19 233 lb (105.7 kg)     Physical Exam Constitutional:      General: He is not in acute distress.    Appearance: He is well-developed.  HENT:     Head: Normocephalic and atraumatic.     Right Ear: External ear normal.     Left Ear: External ear normal.     Nose: Nose normal.  Eyes:     Conjunctiva/sclera: Conjunctivae normal.     Pupils: Pupils are equal, round, and reactive to light.  Cardiovascular:     Rate and Rhythm: Normal rate and regular rhythm.     Heart sounds: Normal heart sounds. No murmur.  Pulmonary:     Effort: Pulmonary effort is normal. No respiratory distress.     Breath sounds: Normal breath sounds. No wheezing or rales.  Abdominal:     Palpations: Abdomen is soft.     Tenderness: There is no abdominal tenderness.  Genitourinary:    Testes:        Right: Tenderness and swelling (The swelling is mostly on the right side.  I could not appreciate a loop of bowel.  The right testicle could not be palpated adequately although its present was detected.  The left was palpable and normal in configuration.Marland Kitchen) present.        Left: Swelling present.  Musculoskeletal:        General: Normal range of motion.     Cervical back: Normal range of motion and neck supple.  Skin:    General: Skin is warm and dry.  Neurological:     Mental Status: He is alert and oriented to person, place, and time.     Deep Tendon Reflexes: Reflexes are normal and symmetric.  Psychiatric:        Behavior: Behavior normal.        Thought  Content: Thought content normal.        Judgment: Judgment normal.     Results for orders placed or performed in visit on 01/29/19  CBC  Result Value Ref Range   WBC 4.3 3.4 - 10.8 x10E3/uL   RBC 2.91 (L) 4.14 - 5.80 x10E6/uL   Hemoglobin 8.7 (LL) 13.0 - 17.7 g/dL   Hematocrit 27.3 (L) 37.5 - 51.0 %   MCV 94 79 - 97 fL   MCH 29.9 26.6 - 33.0 pg   MCHC 31.9 31.5 - 35.7 g/dL   RDW 14.8 11.6 - 15.4 %   Platelets 126 (L) 150 - 450 x10E3/uL   Neutrophils 77 Not Estab. %   Lymphs 11 Not Estab. %   Monocytes 7 Not Estab. %   Eos  3 Not Estab. %   Basos 1 Not Estab. %   Neutrophils Absolute 3.4 1.4 - 7.0 x10E3/uL   Lymphocytes Absolute 0.5 (L) 0.7 - 3.1 x10E3/uL   Monocytes Absolute 0.3 0.1 - 0.9 x10E3/uL   EOS (ABSOLUTE) 0.1 0.0 - 0.4 x10E3/uL   Basophils Absolute 0.0 0.0 - 0.2 x10E3/uL   Immature Granulocytes 1 Not Estab. %   Immature Grans (Abs) 0.0 0.0 - 0.1 x10E3/uL  CMP  Result Value Ref Range   Glucose 206 (H) 65 - 99 mg/dL   BUN 102 (HH) 8 - 27 mg/dL   Creatinine, Ser 4.40 (HH) 0.76 - 1.27 mg/dL   GFR calc non Af Amer 12 (L) >59 mL/min/1.73   GFR calc Af Amer 14 (L) >59 mL/min/1.73   BUN/Creatinine Ratio 23 10 - 24   Sodium 134 134 - 144 mmol/L   Potassium 4.1 3.5 - 5.2 mmol/L   Chloride 99 96 - 106 mmol/L   CO2 16 (L) 20 - 29 mmol/L   Calcium 9.3 8.6 - 10.2 mg/dL   Total Protein 7.6 6.0 - 8.5 g/dL   Albumin 3.9 3.7 - 4.7 g/dL   Globulin, Total 3.7 1.5 - 4.5 g/dL   Albumin/Globulin Ratio 1.1 (L) 1.2 - 2.2   Bilirubin Total 0.4 0.0 - 1.2 mg/dL   Alkaline Phosphatase 83 39 - 117 IU/L   AST 11 0 - 40 IU/L   ALT 8 0 - 44 IU/L     Assessment & Plan:   Amariyon was seen today for follow-up.  Diagnoses and all orders for this visit:  CKD (chronic kidney disease) stage 5, GFR less than 15 ml/min (HCC) -     CMP14+EGFR -     furosemide (LASIX) 80 MG tablet; Two on awakening in the morning and one at lunchtime  Diabetes mellitus type 2 in nonobese [facility]) -      CMP14+EGFR  Scrotal edema -     US SCROTUM W/DOPPLER; Future -     CMP14+EGFR  Essential hypertension -     CMP14+EGFR -     amLODipine (NORVASC) 10 MG tablet; Take 1 tablet (10 mg total) by mouth daily. -     carvedilol (COREG) 25 MG tablet; TAKE 1 & 1/2 TABLET BY MOUTH TWICE A DAY -     cloNIDine (CATAPRES) 0.1 MG tablet; TAKE 2 TABLETS BY MOUTH 3 TIMES A DAY. -     doxazosin (CARDURA) 4 MG tablet; Take 1 tablet (4 mg total) by mouth 2 (two) times daily. -     furosemide (LASIX) 80 MG tablet; Two on awakening in the morning and one at lunchtime -     hydrALAZINE (APRESOLINE) 100 MG tablet; Take 1 tablet (100 mg total) by mouth 4 (four) times daily -  before meals and at bedtime.  Other iron deficiency anemia -     CMP14+EGFR -     CBC with Differential/Platelet  Hyperuricemia -     Uric acid -     allopurinol (ZYLOPRIM) 100 MG tablet; Take 1 tablet (100 mg total) by mouth daily.  Hypertensive heart disease with chronic combined systolic and diastolic congestive heart failure (HCC) -     amLODipine (NORVASC) 10 MG tablet; Take 1 tablet (10 mg total) by mouth daily. -     carvedilol (COREG) 25 MG tablet; TAKE 1 & 1/2 TABLET BY MOUTH TWICE A DAY -     doxazosin (CARDURA) 4 MG tablet; Take 1 tablet (4 mg total) by mouth  2 (two) times daily. -     hydrALAZINE (APRESOLINE) 100 MG tablet; Take 1 tablet (100 mg total) by mouth 4 (four) times daily -  before meals and at bedtime. -     isosorbide dinitrate (ISORDIL) 20 MG tablet; Take 2 tablets (40 mg total) by mouth 3 (three) times daily.  Benign prostatic hyperplasia with urinary frequency -     doxazosin (CARDURA) 4 MG tablet; Take 1 tablet (4 mg total) by mouth 2 (two) times daily. -     finasteride (PROSCAR) 5 MG tablet; Take 1 tablet (5 mg total) by mouth daily. -     tamsulosin (FLOMAX) 0.4 MG CAPS capsule; Take 2 capsules (0.8 mg total) by mouth daily.  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 At 7 am  Dyslipidemia -     rosuvastatin (CRESTOR) 20 MG tablet; Take 1 tablet (20 mg total) by mouth every evening.  Other orders -     Tdap vaccine greater than or equal to 7yo IM -     fluticasone (FLONASE) 50 MCG/ACT nasal spray; USE 2 SPRAYS IN EACH NOSTRIL AT BEDTIME -     gabapentin (NEURONTIN) 100 MG capsule; TAKE 1 CAPSULE (100 MG TOTAL) BY MOUTH THREE (3) TIMES A DAY.       I have discontinued Thane L. Pohl Jr.'s ferrous sulfate and doxycycline. I have also changed his allopurinol, amLODipine, finasteride, hydrALAZINE, and tamsulosin. Additionally, I am having him maintain his acetaminophen, aspirin, glucose blood, Amitiza, levothyroxine, betamethasone valerate, carvedilol, cloNIDine, doxazosin, fluticasone, furosemide, gabapentin, Lantus SoloStar, rosuvastatin, and isosorbide dinitrate.  Allergies as of 03/12/2019      Reactions   Sulfa Antibiotics Hives      Medication List       Accurate as of March 12, 2019  3:16 PM. If you have any questions, ask your nurse or doctor.        STOP taking these medications   doxycycline 100 MG capsule Commonly known as: Vibramycin Stopped by: Claretta Fraise, MD   ferrous sulfate 325 (65 FE) MG tablet Stopped by: Claretta Fraise, MD     TAKE these medications   acetaminophen 325 MG tablet Commonly known as: TYLENOL Take 2 tablets (650 mg total) by mouth every 6 (six) hours as needed for mild pain.   allopurinol 100 MG tablet Commonly known as: ZYLOPRIM Take 1 tablet (100 mg total) by mouth daily.   Amitiza 24 MCG capsule Generic drug: lubiprostone Take 1 capsule (24 mcg total) by mouth 2 (two) times daily.   amLODipine 10 MG tablet Commonly known as: NORVASC Take 1 tablet (10 mg total) by mouth daily.   aspirin 81 MG EC tablet Take 81 mg by mouth daily. Swallow whole.   betamethasone valerate 0.1 % cream Commonly  known as: VALISONE USE AS DIRECTED   carvedilol 25 MG tablet Commonly known as: COREG TAKE 1 & 1/2 TABLET BY MOUTH TWICE A DAY   cloNIDine 0.1 MG tablet Commonly known as: CATAPRES TAKE 2 TABLETS BY MOUTH 3 TIMES A DAY.   doxazosin 4 MG tablet Commonly known as: CARDURA Take 1 tablet (4 mg total) by mouth 2 (two) times daily.   finasteride 5 MG tablet Commonly known as: PROSCAR Take 1 tablet (5 mg total) by mouth daily.   fluticasone 50 MCG/ACT nasal spray Commonly known as: FLONASE USE 2  SPRAYS IN EACH NOSTRIL AT BEDTIME   furosemide 80 MG tablet Commonly known as: LASIX Two on awakening in the morning and one at lunchtime   gabapentin 100 MG capsule Commonly known as: NEURONTIN TAKE 1 CAPSULE (100 MG TOTAL) BY MOUTH THREE (3) TIMES A DAY.   glucose blood test strip Commonly known as: ONE TOUCH ULTRA TEST Use as instructed   hydrALAZINE 100 MG tablet Commonly known as: APRESOLINE Take 1 tablet (100 mg total) by mouth 4 (four) times daily -  before meals and at bedtime.   isosorbide dinitrate 20 MG tablet Commonly known as: ISORDIL Take 2 tablets (40 mg total) by mouth 3 (three) times daily.   Lantus SoloStar 100 UNIT/ML Solostar Pen Generic drug: Insulin Glargine Inject 12 Units into the skin daily. At 7 am   levothyroxine 100 MCG tablet Commonly known as: SYNTHROID Take 1 tablet (100 mcg total) by mouth daily.   rosuvastatin 20 MG tablet Commonly known as: CRESTOR Take 1 tablet (20 mg total) by mouth every evening.   tamsulosin 0.4 MG Caps capsule Commonly known as: FLOMAX Take 2 capsules (0.8 mg total) by mouth daily.     Patient's multiple conditions appear to be stable at this time with regard to renal insufficiency diabetes and blood pressure.  His weight is down 11 pounds which is definitely a plus considering his tendency to significant edema.    He is to not do an A1c for few more days to weeks.  Since all else is stable at this time including  home blood sugars will go ahead and give him the full 6 weeks between his usual follow-ups.  The source of the chronic scrotal edema is not clear.  This may simply be fluid from his fluid overload state based on his kidney disease.  It may well be a hydrocele and/or varicocele.  I cannot without testing to rule out a loop of bowel from an inguinal hernia.  And in fact it may be a combination of any and all 3 of these.   Follow-up: Return in about 6 weeks (around 04/23/2019).  Claretta Fraise, M.D.

## 2019-03-13 ENCOUNTER — Telehealth: Payer: Self-pay | Admitting: Family Medicine

## 2019-03-13 LAB — CMP14+EGFR
ALT: 5 IU/L (ref 0–44)
AST: 9 IU/L (ref 0–40)
Albumin/Globulin Ratio: 1 — ABNORMAL LOW (ref 1.2–2.2)
Albumin: 4 g/dL (ref 3.7–4.7)
Alkaline Phosphatase: 91 IU/L (ref 39–117)
BUN/Creatinine Ratio: 21 (ref 10–24)
BUN: 92 mg/dL (ref 8–27)
Bilirubin Total: 0.3 mg/dL (ref 0.0–1.2)
CO2: 17 mmol/L — ABNORMAL LOW (ref 20–29)
Calcium: 9.4 mg/dL (ref 8.6–10.2)
Chloride: 98 mmol/L (ref 96–106)
Creatinine, Ser: 4.35 mg/dL (ref 0.76–1.27)
GFR calc Af Amer: 14 mL/min/{1.73_m2} — ABNORMAL LOW (ref >59)
GFR calc non Af Amer: 12 mL/min/{1.73_m2} — ABNORMAL LOW (ref >59)
Globulin, Total: 4.2 g/dL (ref 1.5–4.5)
Glucose: 193 mg/dL — ABNORMAL HIGH (ref 65–99)
Potassium: 4 mmol/L (ref 3.5–5.2)
Sodium: 135 mmol/L (ref 134–144)
Total Protein: 8.2 g/dL (ref 6.0–8.5)

## 2019-03-13 LAB — URIC ACID: Uric Acid: 8.6 mg/dL — ABNORMAL HIGH (ref 3.8–8.4)

## 2019-03-13 LAB — CBC WITH DIFFERENTIAL/PLATELET
Basophils Absolute: 0 10*3/uL (ref 0.0–0.2)
Basos: 1 %
EOS (ABSOLUTE): 0.2 10*3/uL (ref 0.0–0.4)
Eos: 3 %
Hematocrit: 32 % — ABNORMAL LOW (ref 37.5–51.0)
Hemoglobin: 10.2 g/dL — ABNORMAL LOW (ref 13.0–17.7)
Immature Grans (Abs): 0 10*3/uL (ref 0.0–0.1)
Immature Granulocytes: 1 %
Lymphocytes Absolute: 0.6 10*3/uL — ABNORMAL LOW (ref 0.7–3.1)
Lymphs: 10 %
MCH: 29.3 pg (ref 26.6–33.0)
MCHC: 31.9 g/dL (ref 31.5–35.7)
MCV: 92 fL (ref 79–97)
Monocytes Absolute: 0.4 10*3/uL (ref 0.1–0.9)
Monocytes: 6 %
Neutrophils Absolute: 4.5 10*3/uL (ref 1.4–7.0)
Neutrophils: 79 %
Platelets: 151 10*3/uL (ref 150–450)
RBC: 3.48 x10E6/uL — ABNORMAL LOW (ref 4.14–5.80)
RDW: 15.2 % (ref 11.6–15.4)
WBC: 5.6 10*3/uL (ref 3.4–10.8)

## 2019-03-13 NOTE — Telephone Encounter (Signed)
Critical- nurse aware to let provider know to check in basket.

## 2019-03-13 NOTE — Telephone Encounter (Signed)
That's his baseline- on verge of needing dialysis.

## 2019-03-13 NOTE — Telephone Encounter (Signed)
[facility] called to give lab result. Patients Creatinine level is 4.35

## 2019-03-13 NOTE — Telephone Encounter (Signed)
Patient aware and states Stacks told to follow up in 6 weeks.  If he needs to be seen sooner I told patient we will call him back.

## 2019-03-13 NOTE — Telephone Encounter (Signed)
6 weeks is good. Thanks, WS

## 2019-03-14 DIAGNOSIS — D509 Iron deficiency anemia, unspecified: Secondary | ICD-10-CM | POA: Diagnosis not present

## 2019-03-14 DIAGNOSIS — Z79899 Other long term (current) drug therapy: Secondary | ICD-10-CM | POA: Diagnosis not present

## 2019-03-14 DIAGNOSIS — D631 Anemia in chronic kidney disease: Secondary | ICD-10-CM | POA: Diagnosis not present

## 2019-03-14 DIAGNOSIS — E559 Vitamin D deficiency, unspecified: Secondary | ICD-10-CM | POA: Diagnosis not present

## 2019-03-14 DIAGNOSIS — R809 Proteinuria, unspecified: Secondary | ICD-10-CM | POA: Diagnosis not present

## 2019-03-14 DIAGNOSIS — N185 Chronic kidney disease, stage 5: Secondary | ICD-10-CM | POA: Diagnosis not present

## 2019-03-15 ENCOUNTER — Ambulatory Visit (HOSPITAL_COMMUNITY)
Admission: RE | Admit: 2019-03-15 | Discharge: 2019-03-15 | Disposition: A | Discharge status: Home / Self Care | Payer: Medicare Other | Source: Ambulatory Visit | Attending: Family Medicine | Admitting: Family Medicine

## 2019-03-15 ENCOUNTER — Other Ambulatory Visit: Payer: Self-pay

## 2019-03-15 DIAGNOSIS — N5089 Other specified disorders of the male genital organs: Secondary | ICD-10-CM

## 2019-03-18 NOTE — Progress Notes (Signed)
Hello Jarid,  Your lab result is normal and/or stable.Some minor variations that are not significant are commonly marked abnormal, but do not represent any medical problem for you.  Best regards, Kasidee Voisin, M.D.

## 2019-03-21 DIAGNOSIS — E559 Vitamin D deficiency, unspecified: Secondary | ICD-10-CM | POA: Diagnosis not present

## 2019-03-21 DIAGNOSIS — Z79899 Other long term (current) drug therapy: Secondary | ICD-10-CM | POA: Diagnosis not present

## 2019-03-21 DIAGNOSIS — D631 Anemia in chronic kidney disease: Secondary | ICD-10-CM | POA: Diagnosis not present

## 2019-03-21 DIAGNOSIS — N185 Chronic kidney disease, stage 5: Secondary | ICD-10-CM | POA: Diagnosis not present

## 2019-03-21 DIAGNOSIS — R809 Proteinuria, unspecified: Secondary | ICD-10-CM | POA: Diagnosis not present

## 2019-03-21 DIAGNOSIS — D509 Iron deficiency anemia, unspecified: Secondary | ICD-10-CM | POA: Diagnosis not present

## 2019-03-26 ENCOUNTER — Telehealth: Payer: Self-pay | Admitting: Family Medicine

## 2019-03-26 NOTE — Chronic Care Management (AMB) (Signed)
Chronic Care Management   Note  03/26/2019 Name: [name]. MRN: [id number] DOB: 1945-10-15  [name] is a 74 y.o. year old male who is a primary care patient of Stacks, Cletus Gash, MD. I reached out to [place]. by phone today in response to a referral sent by Mr. Benay Pillow Jr.'s health plan.     Mr. Schaller was given information about Chronic Care Management services today including:  1. CCM service includes personalized support from designated clinical staff supervised by his physician, including individualized plan of care and coordination with other care providers 2. 24/7 contact phone numbers for assistance for urgent and routine care needs. 3. Service will only be billed when office clinical staff spend 20 minutes or more in a month to coordinate care. 4. Only one practitioner may furnish and bill the service in a calendar month. 5. The patient may stop CCM services at any time (effective at the end of the month) by phone call to the office staff. 6. The patient will be responsible for cost sharing (co-pay) of up to 20% of the service fee (after annual deductible is met).  Patient did not agree to enrollment in care management services and does not wish to consider at this time.  Follow up plan: The patient has been provided with contact information for the care management team and has been advised to call with any health related questions or concerns.   Noreene Larsson, [address], [place], [place] [address] Direct Dial: [phone] [email or web address] Website: [email or web address]

## 2019-03-28 DIAGNOSIS — R809 Proteinuria, unspecified: Secondary | ICD-10-CM | POA: Diagnosis not present

## 2019-03-28 DIAGNOSIS — D631 Anemia in chronic kidney disease: Secondary | ICD-10-CM | POA: Diagnosis not present

## 2019-03-28 DIAGNOSIS — D509 Iron deficiency anemia, unspecified: Secondary | ICD-10-CM | POA: Diagnosis not present

## 2019-03-28 DIAGNOSIS — E559 Vitamin D deficiency, unspecified: Secondary | ICD-10-CM | POA: Diagnosis not present

## 2019-03-28 DIAGNOSIS — Z79899 Other long term (current) drug therapy: Secondary | ICD-10-CM | POA: Diagnosis not present

## 2019-03-28 DIAGNOSIS — N185 Chronic kidney disease, stage 5: Secondary | ICD-10-CM | POA: Diagnosis not present

## 2019-04-03 DIAGNOSIS — R600 Localized edema: Secondary | ICD-10-CM | POA: Diagnosis not present

## 2019-04-03 DIAGNOSIS — E871 Hypo-osmolality and hyponatremia: Secondary | ICD-10-CM | POA: Diagnosis not present

## 2019-04-03 DIAGNOSIS — N185 Chronic kidney disease, stage 5: Secondary | ICD-10-CM | POA: Diagnosis not present

## 2019-04-03 DIAGNOSIS — R809 Proteinuria, unspecified: Secondary | ICD-10-CM | POA: Diagnosis not present

## 2019-04-03 DIAGNOSIS — D631 Anemia in chronic kidney disease: Secondary | ICD-10-CM | POA: Diagnosis not present

## 2019-04-05 DIAGNOSIS — D509 Iron deficiency anemia, unspecified: Secondary | ICD-10-CM | POA: Diagnosis not present

## 2019-04-05 DIAGNOSIS — D631 Anemia in chronic kidney disease: Secondary | ICD-10-CM | POA: Diagnosis not present

## 2019-04-05 DIAGNOSIS — R809 Proteinuria, unspecified: Secondary | ICD-10-CM | POA: Diagnosis not present

## 2019-04-05 DIAGNOSIS — E559 Vitamin D deficiency, unspecified: Secondary | ICD-10-CM | POA: Diagnosis not present

## 2019-04-05 DIAGNOSIS — Z79899 Other long term (current) drug therapy: Secondary | ICD-10-CM | POA: Diagnosis not present

## 2019-04-05 DIAGNOSIS — N185 Chronic kidney disease, stage 5: Secondary | ICD-10-CM | POA: Diagnosis not present

## 2019-04-11 DIAGNOSIS — D631 Anemia in chronic kidney disease: Secondary | ICD-10-CM | POA: Diagnosis not present

## 2019-04-11 DIAGNOSIS — R809 Proteinuria, unspecified: Secondary | ICD-10-CM | POA: Diagnosis not present

## 2019-04-11 DIAGNOSIS — N185 Chronic kidney disease, stage 5: Secondary | ICD-10-CM | POA: Diagnosis not present

## 2019-04-11 DIAGNOSIS — Z79899 Other long term (current) drug therapy: Secondary | ICD-10-CM | POA: Diagnosis not present

## 2019-04-11 DIAGNOSIS — E559 Vitamin D deficiency, unspecified: Secondary | ICD-10-CM | POA: Diagnosis not present

## 2019-04-11 DIAGNOSIS — D509 Iron deficiency anemia, unspecified: Secondary | ICD-10-CM | POA: Diagnosis not present

## 2019-04-18 DIAGNOSIS — Z79899 Other long term (current) drug therapy: Secondary | ICD-10-CM | POA: Diagnosis not present

## 2019-04-18 DIAGNOSIS — N185 Chronic kidney disease, stage 5: Secondary | ICD-10-CM | POA: Diagnosis not present

## 2019-04-18 DIAGNOSIS — D631 Anemia in chronic kidney disease: Secondary | ICD-10-CM | POA: Diagnosis not present

## 2019-04-18 DIAGNOSIS — D509 Iron deficiency anemia, unspecified: Secondary | ICD-10-CM | POA: Diagnosis not present

## 2019-04-18 DIAGNOSIS — R809 Proteinuria, unspecified: Secondary | ICD-10-CM | POA: Diagnosis not present

## 2019-04-18 DIAGNOSIS — E559 Vitamin D deficiency, unspecified: Secondary | ICD-10-CM | POA: Diagnosis not present

## 2019-04-25 DIAGNOSIS — E559 Vitamin D deficiency, unspecified: Secondary | ICD-10-CM | POA: Diagnosis not present

## 2019-04-25 DIAGNOSIS — N185 Chronic kidney disease, stage 5: Secondary | ICD-10-CM | POA: Diagnosis not present

## 2019-04-25 DIAGNOSIS — D631 Anemia in chronic kidney disease: Secondary | ICD-10-CM | POA: Diagnosis not present

## 2019-04-25 DIAGNOSIS — D509 Iron deficiency anemia, unspecified: Secondary | ICD-10-CM | POA: Diagnosis not present

## 2019-04-25 DIAGNOSIS — Z79899 Other long term (current) drug therapy: Secondary | ICD-10-CM | POA: Diagnosis not present

## 2019-04-25 DIAGNOSIS — R809 Proteinuria, unspecified: Secondary | ICD-10-CM | POA: Diagnosis not present

## 2019-04-30 ENCOUNTER — Encounter: Payer: Self-pay | Admitting: Family Medicine

## 2019-04-30 ENCOUNTER — Other Ambulatory Visit: Payer: Self-pay

## 2019-04-30 ENCOUNTER — Ambulatory Visit (INDEPENDENT_AMBULATORY_CARE_PROVIDER_SITE_OTHER): Payer: Medicare Other | Admitting: Family Medicine

## 2019-04-30 VITALS — BP 139/58 | HR 59 | Temp 99.6°F | Ht 73.0 in | Wt 234.0 lb

## 2019-04-30 DIAGNOSIS — E119 Type 2 diabetes mellitus without complications: Secondary | ICD-10-CM | POA: Diagnosis not present

## 2019-04-30 DIAGNOSIS — E039 Hypothyroidism, unspecified: Secondary | ICD-10-CM | POA: Diagnosis not present

## 2019-04-30 DIAGNOSIS — R601 Generalized edema: Secondary | ICD-10-CM

## 2019-04-30 DIAGNOSIS — I10 Essential (primary) hypertension: Secondary | ICD-10-CM

## 2019-04-30 DIAGNOSIS — N185 Chronic kidney disease, stage 5: Secondary | ICD-10-CM

## 2019-04-30 DIAGNOSIS — I5042 Chronic combined systolic (congestive) and diastolic (congestive) heart failure: Secondary | ICD-10-CM

## 2019-04-30 DIAGNOSIS — I110 Hypertensive heart disease with heart failure: Secondary | ICD-10-CM | POA: Diagnosis not present

## 2019-04-30 LAB — BAYER DCA HB A1C WAIVED: HB A1C (BAYER DCA - WAIVED): 9.6 % — ABNORMAL HIGH (ref <7.0)

## 2019-04-30 MED ORDER — AMITIZA 24 MCG PO CAPS: 24.0000 ug | ORAL_CAPSULE | Freq: Two times a day (BID) | ORAL | 1 refills | Status: AC

## 2019-04-30 MED ORDER — LEVOTHYROXINE SODIUM 100 MCG PO TABS: 100.0000 ug | ORAL_TABLET | Freq: Every day | ORAL | 1 refills | Status: AC

## 2019-04-30 NOTE — Progress Notes (Signed)
Subjective:  Patient ID: [name]., male    DOB: 12-Nov-1945  Age: 74 y.o. MRN: [id number]  CC: Follow-up (6 week)   HPI [name]. presents for presents forFollow-up of diabetes. Patient checks blood sugar at home.   133 fasting and not checking postprandial Patient denies symptoms such as polyuria, polydipsia, excessive hunger, nausea No significant hypoglycemic spells noted. Medications reviewed. Pt reports taking them regularly without complication/adverse reaction being reported today.  Patient has the fatigue that goes with his chronic renal insufficiency.  He is noted to have some weight increase but has no complaints today with regard to shortness of breath swelling etc.  Depression screen [facility] 2/9 04/30/2019 03/12/2019 01/29/2019  Decreased Interest 0 0 0  Down, Depressed, Hopeless 0 0 0  PHQ - 2 Score 0 0 0  Altered sleeping - - -  Tired, decreased energy - - -  Change in appetite - - -  Feeling bad or failure about yourself  - - -  Trouble concentrating - - -  Moving slowly or fidgety/restless - - -  Suicidal thoughts - - -  PHQ-9 Score - - -  Difficult doing work/chores - - -    History Dontravious has a past medical history of Acute blood loss anemia, Acute pulmonary edema ([place]), CAD (coronary artery disease), native coronary artery, Carotid artery occlusion, Chronic kidney disease, Diabetes mellitus without complication ([place]), Hypertension, NSTEMI (non-ST elevated myocardial infarction) ([place]) (07/25/2016), and S/P CABG (coronary artery bypass graft).   He has a past surgical history that includes Lumbar fusion (2005); LEFT HEART CATH AND CORONARY ANGIOGRAPHY (N/A, 07/28/2016); Coronary artery bypass graft (N/A, 08/05/2016); TEE without cardioversion (N/A, 08/05/2016); Joint replacement (Bilateral); Appendectomy; and Tonsilectomy, adenoidectomy, bilateral myringotomy and tubes.   His family history includes Arthritis in his father, maternal grandfather, maternal  grandmother, mother, paternal grandfather, and paternal grandmother; Asthma in his sister; Cancer in his brother, brother, father, and mother; Diabetes in his brother, father, and mother; Heart attack in his son; Heart disease in his mother; Hyperlipidemia in his brother, brother, brother, father, mother, sister, and sister; Hypertension in his brother, brother, brother, father, mother, sister, and sister; Kidney disease in his mother; Vision loss in his sister.He reports that he quit smoking about 41 years ago. He has never used smokeless tobacco. He reports that he does not drink alcohol or use drugs.    ROS Review of Systems  Constitutional: Negative for fever.  Respiratory: Negative for shortness of breath.   Cardiovascular: Negative for chest pain.  Musculoskeletal: Negative for arthralgias.  Skin: Negative for rash.    Objective:  BP (!) 139/58   Pulse (!) 59   Temp 99.6 F (37.6 C) (Temporal)   Ht 6' 1"  (1.854 m)   Wt 234 lb (106.1 kg)   BMI 30.87 kg/m   BP Readings from Last 3 Encounters:  04/30/19 (!) 139/58  03/12/19 139/64  01/29/19 (!) 167/61    Wt Readings from Last 3 Encounters:  04/30/19 234 lb (106.1 kg)  03/12/19 222 lb 6.4 oz (100.9 kg)  01/29/19 233 lb (105.7 kg)     Physical Exam Constitutional:      General: He is not in acute distress.    Appearance: He is well-developed.  HENT:     Head: Normocephalic and atraumatic.     Right Ear: External ear normal.     Left Ear: External ear normal.     Nose: Nose normal.  Eyes:  Conjunctiva/sclera: Conjunctivae normal.     Pupils: Pupils are equal, round, and reactive to light.  Cardiovascular:     Rate and Rhythm: Normal rate and regular rhythm.     Heart sounds: Normal heart sounds. No murmur.  Pulmonary:     Effort: Pulmonary effort is normal. No respiratory distress.     Breath sounds: Normal breath sounds. No wheezing or rales.  Abdominal:     Palpations: Abdomen is soft.     Tenderness:  There is no abdominal tenderness.  Musculoskeletal:        General: Normal range of motion.     Cervical back: Normal range of motion and neck supple.  Skin:    General: Skin is warm and dry.  Neurological:     Mental Status: He is alert and oriented to person, place, and time.     Deep Tendon Reflexes: Reflexes are normal and symmetric.  Psychiatric:        Behavior: Behavior normal.        Thought Content: Thought content normal.        Judgment: Judgment normal.       Assessment & Plan:   Arias was seen today for follow-up.  Diagnoses and all orders for this visit:  Diabetes mellitus type 2 in nonobese ([place]) -     CBC with Differential/Platelet -     Bayer DCA Hb A1c Waived -     CMP14+EGFR  Hypothyroidism, unspecified type -     levothyroxine (SYNTHROID) 100 MCG tablet; Take 1 tablet (100 mcg total) by mouth daily. -     CBC with Differential/Platelet -     CMP14+EGFR -     TSH + free T4  CKD (chronic kidney disease) stage 5, GFR less than 15 ml/min (HCC) -     CBC with Differential/Platelet -     CMP14+EGFR  Essential hypertension -     CBC with Differential/Platelet -     CMP14+EGFR  Hypertensive heart disease with chronic combined systolic and diastolic congestive heart failure (HCC) -     CBC with Differential/Platelet -     CMP14+EGFR -     Brain natriuretic peptide  Generalized edema -     CBC with Differential/Platelet -     CMP14+EGFR -     Brain natriuretic peptide  Other orders -     AMITIZA 24 MCG capsule; Take 1 capsule (24 mcg total) by mouth 2 (two) times daily.       I am having Marlo L. Kimbler Jr. maintain his acetaminophen, aspirin, glucose blood, betamethasone valerate, allopurinol, amLODipine, carvedilol, cloNIDine, doxazosin, finasteride, fluticasone, furosemide, gabapentin, hydrALAZINE, Lantus SoloStar, rosuvastatin, isosorbide dinitrate, tamsulosin, sevelamer carbonate, levothyroxine, and Amitiza.  Allergies as of 04/30/2019       Reactions   Sulfa Antibiotics Hives      Medication List       Accurate as of April 30, 2019  6:10 PM. If you have any questions, ask your nurse or doctor.        acetaminophen 325 MG tablet Commonly known as: TYLENOL Take 2 tablets (650 mg total) by mouth every 6 (six) hours as needed for mild pain.   allopurinol 100 MG tablet Commonly known as: ZYLOPRIM Take 1 tablet (100 mg total) by mouth daily.   Amitiza 24 MCG capsule Generic drug: lubiprostone Take 1 capsule (24 mcg total) by mouth 2 (two) times daily.   amLODipine 10 MG tablet Commonly known as: NORVASC Take 1 tablet (10  mg total) by mouth daily.   aspirin 81 MG EC tablet Take 81 mg by mouth daily. Swallow whole.   betamethasone valerate 0.1 % cream Commonly known as: VALISONE USE AS DIRECTED   carvedilol 25 MG tablet Commonly known as: COREG TAKE 1 & 1/2 TABLET BY MOUTH TWICE A DAY   cloNIDine 0.1 MG tablet Commonly known as: CATAPRES TAKE 2 TABLETS BY MOUTH 3 TIMES A DAY.   doxazosin 4 MG tablet Commonly known as: CARDURA Take 1 tablet (4 mg total) by mouth 2 (two) times daily.   finasteride 5 MG tablet Commonly known as: PROSCAR Take 1 tablet (5 mg total) by mouth daily.   fluticasone 50 MCG/ACT nasal spray Commonly known as: FLONASE USE 2 SPRAYS IN EACH NOSTRIL AT BEDTIME   furosemide 80 MG tablet Commonly known as: LASIX Two on awakening in the morning and one at lunchtime   gabapentin 100 MG capsule Commonly known as: NEURONTIN TAKE 1 CAPSULE (100 MG TOTAL) BY MOUTH THREE (3) TIMES A DAY.   glucose blood test strip Commonly known as: ONE TOUCH ULTRA TEST Use as instructed   hydrALAZINE 100 MG tablet Commonly known as: APRESOLINE Take 1 tablet (100 mg total) by mouth 4 (four) times daily -  before meals and at bedtime.   isosorbide dinitrate 20 MG tablet Commonly known as: ISORDIL Take 2 tablets (40 mg total) by mouth 3 (three) times daily.   Lantus SoloStar 100 UNIT/ML Solostar  Pen Generic drug: insulin glargine Inject 12 Units into the skin daily. At 7 am   levothyroxine 100 MCG tablet Commonly known as: SYNTHROID Take 1 tablet (100 mcg total) by mouth daily.   rosuvastatin 20 MG tablet Commonly known as: CRESTOR Take 1 tablet (20 mg total) by mouth every evening.   sevelamer carbonate 800 MG tablet Commonly known as: RENVELA Take by mouth.   tamsulosin 0.4 MG Caps capsule Commonly known as: FLOMAX Take 2 capsules (0.8 mg total) by mouth daily.      Patient is currently an stage V renal insufficiency and his diabetes is out of control.  He is going to need to increase his Lantus to about 16 units a day.  Check his sugars twice daily.  Continue his medications for cholesterol and high blood pressure.  His renal function is the from my practice he has been encouraged to follow-up with his kidney specialist.  Follow-up: Return in about 3 months (around 07/31/2019).  Claretta Fraise, M.D.

## 2019-05-01 LAB — CBC WITH DIFFERENTIAL/PLATELET
Basophils Absolute: 0 10*3/uL (ref 0.0–0.2)
Basos: 1 %
EOS (ABSOLUTE): 0.6 10*3/uL — ABNORMAL HIGH (ref 0.0–0.4)
Eos: 11 %
Hematocrit: 29.1 % — ABNORMAL LOW (ref 37.5–51.0)
Hemoglobin: 9.4 g/dL — ABNORMAL LOW (ref 13.0–17.7)
Immature Grans (Abs): 0 10*3/uL (ref 0.0–0.1)
Immature Granulocytes: 0 %
Lymphocytes Absolute: 0.6 10*3/uL — ABNORMAL LOW (ref 0.7–3.1)
Lymphs: 11 %
MCH: 30.2 pg (ref 26.6–33.0)
MCHC: 32.3 g/dL (ref 31.5–35.7)
MCV: 94 fL (ref 79–97)
Monocytes Absolute: 0.4 10*3/uL (ref 0.1–0.9)
Monocytes: 8 %
Neutrophils Absolute: 3.7 10*3/uL (ref 1.4–7.0)
Neutrophils: 69 %
Platelets: 157 10*3/uL (ref 150–450)
RBC: 3.11 x10E6/uL — ABNORMAL LOW (ref 4.14–5.80)
RDW: 16.4 % — ABNORMAL HIGH (ref 11.6–15.4)
WBC: 5.3 10*3/uL (ref 3.4–10.8)

## 2019-05-01 LAB — CMP14+EGFR
ALT: 5 IU/L (ref 0–44)
AST: 6 IU/L (ref 0–40)
Albumin/Globulin Ratio: 0.9 — ABNORMAL LOW (ref 1.2–2.2)
Albumin: 3.4 g/dL — ABNORMAL LOW (ref 3.7–4.7)
Alkaline Phosphatase: 84 IU/L (ref 39–117)
BUN/Creatinine Ratio: 18 (ref 10–24)
BUN: 65 mg/dL — ABNORMAL HIGH (ref 8–27)
Bilirubin Total: 0.2 mg/dL (ref 0.0–1.2)
CO2: 17 mmol/L — ABNORMAL LOW (ref 20–29)
Calcium: 8.8 mg/dL (ref 8.6–10.2)
Chloride: 106 mmol/L (ref 96–106)
Creatinine, Ser: 3.59 mg/dL (ref 0.76–1.27)
GFR calc Af Amer: 18 mL/min/{1.73_m2} — ABNORMAL LOW (ref >59)
GFR calc non Af Amer: 16 mL/min/{1.73_m2} — ABNORMAL LOW (ref >59)
Globulin, Total: 3.8 g/dL (ref 1.5–4.5)
Glucose: 114 mg/dL — ABNORMAL HIGH (ref 65–99)
Potassium: 4.6 mmol/L (ref 3.5–5.2)
Sodium: 135 mmol/L (ref 134–144)
Total Protein: 7.2 g/dL (ref 6.0–8.5)

## 2019-05-01 LAB — BRAIN NATRIURETIC PEPTIDE: BNP: 672.3 pg/mL — ABNORMAL HIGH (ref 0.0–100.0)

## 2019-05-01 LAB — TSH+FREE T4
Free T4: 1.03 ng/dL (ref 0.82–1.77)
TSH: 2.76 u[IU]/mL (ref 0.450–4.500)

## 2019-05-02 DIAGNOSIS — Z79899 Other long term (current) drug therapy: Secondary | ICD-10-CM | POA: Diagnosis not present

## 2019-05-02 DIAGNOSIS — R809 Proteinuria, unspecified: Secondary | ICD-10-CM | POA: Diagnosis not present

## 2019-05-02 DIAGNOSIS — D631 Anemia in chronic kidney disease: Secondary | ICD-10-CM | POA: Diagnosis not present

## 2019-05-02 DIAGNOSIS — N185 Chronic kidney disease, stage 5: Secondary | ICD-10-CM | POA: Diagnosis not present

## 2019-05-02 DIAGNOSIS — E559 Vitamin D deficiency, unspecified: Secondary | ICD-10-CM | POA: Diagnosis not present

## 2019-05-02 DIAGNOSIS — D509 Iron deficiency anemia, unspecified: Secondary | ICD-10-CM | POA: Diagnosis not present

## 2019-05-09 DIAGNOSIS — Z79899 Other long term (current) drug therapy: Secondary | ICD-10-CM | POA: Diagnosis not present

## 2019-05-09 DIAGNOSIS — E559 Vitamin D deficiency, unspecified: Secondary | ICD-10-CM | POA: Diagnosis not present

## 2019-05-09 DIAGNOSIS — D631 Anemia in chronic kidney disease: Secondary | ICD-10-CM | POA: Diagnosis not present

## 2019-05-09 DIAGNOSIS — N185 Chronic kidney disease, stage 5: Secondary | ICD-10-CM | POA: Diagnosis not present

## 2019-05-09 DIAGNOSIS — D509 Iron deficiency anemia, unspecified: Secondary | ICD-10-CM | POA: Diagnosis not present

## 2019-05-09 DIAGNOSIS — R809 Proteinuria, unspecified: Secondary | ICD-10-CM | POA: Diagnosis not present

## 2019-05-16 DIAGNOSIS — E559 Vitamin D deficiency, unspecified: Secondary | ICD-10-CM | POA: Diagnosis not present

## 2019-05-16 DIAGNOSIS — Z79899 Other long term (current) drug therapy: Secondary | ICD-10-CM | POA: Diagnosis not present

## 2019-05-16 DIAGNOSIS — R809 Proteinuria, unspecified: Secondary | ICD-10-CM | POA: Diagnosis not present

## 2019-05-16 DIAGNOSIS — N185 Chronic kidney disease, stage 5: Secondary | ICD-10-CM | POA: Diagnosis not present

## 2019-05-16 DIAGNOSIS — D631 Anemia in chronic kidney disease: Secondary | ICD-10-CM | POA: Diagnosis not present

## 2019-05-16 DIAGNOSIS — D509 Iron deficiency anemia, unspecified: Secondary | ICD-10-CM | POA: Diagnosis not present

## 2019-05-22 DIAGNOSIS — D631 Anemia in chronic kidney disease: Secondary | ICD-10-CM | POA: Diagnosis not present

## 2019-05-22 DIAGNOSIS — I129 Hypertensive chronic kidney disease with stage 1 through stage 4 chronic kidney disease, or unspecified chronic kidney disease: Secondary | ICD-10-CM | POA: Diagnosis not present

## 2019-05-22 DIAGNOSIS — R809 Proteinuria, unspecified: Secondary | ICD-10-CM | POA: Diagnosis not present

## 2019-05-22 DIAGNOSIS — N185 Chronic kidney disease, stage 5: Secondary | ICD-10-CM | POA: Diagnosis not present

## 2019-05-22 DIAGNOSIS — E211 Secondary hyperparathyroidism, not elsewhere classified: Secondary | ICD-10-CM | POA: Diagnosis not present

## 2019-05-23 DIAGNOSIS — N185 Chronic kidney disease, stage 5: Secondary | ICD-10-CM | POA: Diagnosis not present

## 2019-05-23 DIAGNOSIS — D509 Iron deficiency anemia, unspecified: Secondary | ICD-10-CM | POA: Diagnosis not present

## 2019-05-23 DIAGNOSIS — D631 Anemia in chronic kidney disease: Secondary | ICD-10-CM | POA: Diagnosis not present

## 2019-05-23 DIAGNOSIS — Z79899 Other long term (current) drug therapy: Secondary | ICD-10-CM | POA: Diagnosis not present

## 2019-05-23 DIAGNOSIS — E559 Vitamin D deficiency, unspecified: Secondary | ICD-10-CM | POA: Diagnosis not present

## 2019-05-23 DIAGNOSIS — R809 Proteinuria, unspecified: Secondary | ICD-10-CM | POA: Diagnosis not present

## 2019-05-30 DIAGNOSIS — R809 Proteinuria, unspecified: Secondary | ICD-10-CM | POA: Diagnosis not present

## 2019-05-30 DIAGNOSIS — N185 Chronic kidney disease, stage 5: Secondary | ICD-10-CM | POA: Diagnosis not present

## 2019-05-30 DIAGNOSIS — E559 Vitamin D deficiency, unspecified: Secondary | ICD-10-CM | POA: Diagnosis not present

## 2019-05-30 DIAGNOSIS — D509 Iron deficiency anemia, unspecified: Secondary | ICD-10-CM | POA: Diagnosis not present

## 2019-05-30 DIAGNOSIS — Z79899 Other long term (current) drug therapy: Secondary | ICD-10-CM | POA: Diagnosis not present

## 2019-05-30 DIAGNOSIS — D631 Anemia in chronic kidney disease: Secondary | ICD-10-CM | POA: Diagnosis not present

## 2019-06-06 ENCOUNTER — Other Ambulatory Visit: Payer: Self-pay | Admitting: Family Medicine

## 2019-06-06 DIAGNOSIS — R809 Proteinuria, unspecified: Secondary | ICD-10-CM | POA: Diagnosis not present

## 2019-06-06 DIAGNOSIS — E559 Vitamin D deficiency, unspecified: Secondary | ICD-10-CM | POA: Diagnosis not present

## 2019-06-06 DIAGNOSIS — D631 Anemia in chronic kidney disease: Secondary | ICD-10-CM | POA: Diagnosis not present

## 2019-06-06 DIAGNOSIS — Z79899 Other long term (current) drug therapy: Secondary | ICD-10-CM | POA: Diagnosis not present

## 2019-06-06 DIAGNOSIS — D509 Iron deficiency anemia, unspecified: Secondary | ICD-10-CM | POA: Diagnosis not present

## 2019-06-06 DIAGNOSIS — N185 Chronic kidney disease, stage 5: Secondary | ICD-10-CM | POA: Diagnosis not present

## 2019-06-11 ENCOUNTER — Inpatient Hospital Stay (HOSPITAL_COMMUNITY): Payer: Medicare Other

## 2019-06-11 ENCOUNTER — Other Ambulatory Visit: Payer: Self-pay

## 2019-06-11 ENCOUNTER — Emergency Department (HOSPITAL_COMMUNITY): Payer: Medicare Other

## 2019-06-11 ENCOUNTER — Inpatient Hospital Stay (HOSPITAL_COMMUNITY)
Admission: EM | Admit: 2019-06-11 | Discharge: 2019-06-14 | DRG: 189 | Disposition: A | Discharge status: Hospice | Payer: Medicare Other | Attending: Family Medicine | Admitting: Family Medicine

## 2019-06-11 ENCOUNTER — Encounter (HOSPITAL_COMMUNITY): Payer: Self-pay

## 2019-06-11 DIAGNOSIS — E1122 Type 2 diabetes mellitus with diabetic chronic kidney disease: Secondary | ICD-10-CM | POA: Diagnosis present

## 2019-06-11 DIAGNOSIS — R0689 Other abnormalities of breathing: Secondary | ICD-10-CM | POA: Diagnosis not present

## 2019-06-11 DIAGNOSIS — J810 Acute pulmonary edema: Secondary | ICD-10-CM | POA: Diagnosis not present

## 2019-06-11 DIAGNOSIS — Z8249 Family history of ischemic heart disease and other diseases of the circulatory system: Secondary | ICD-10-CM | POA: Diagnosis not present

## 2019-06-11 DIAGNOSIS — Z5320 Procedure and treatment not carried out because of patient's decision for unspecified reasons: Secondary | ICD-10-CM | POA: Diagnosis present

## 2019-06-11 DIAGNOSIS — Z743 Need for continuous supervision: Secondary | ICD-10-CM | POA: Diagnosis not present

## 2019-06-11 DIAGNOSIS — N179 Acute kidney failure, unspecified: Secondary | ICD-10-CM

## 2019-06-11 DIAGNOSIS — R069 Unspecified abnormalities of breathing: Secondary | ICD-10-CM | POA: Diagnosis not present

## 2019-06-11 DIAGNOSIS — Z515 Encounter for palliative care: Secondary | ICD-10-CM

## 2019-06-11 DIAGNOSIS — Z87891 Personal history of nicotine dependence: Secondary | ICD-10-CM

## 2019-06-11 DIAGNOSIS — Z20822 Contact with and (suspected) exposure to covid-19: Secondary | ICD-10-CM | POA: Diagnosis not present

## 2019-06-11 DIAGNOSIS — E1129 Type 2 diabetes mellitus with other diabetic kidney complication: Secondary | ICD-10-CM | POA: Diagnosis not present

## 2019-06-11 DIAGNOSIS — Z794 Long term (current) use of insulin: Secondary | ICD-10-CM

## 2019-06-11 DIAGNOSIS — R0902 Hypoxemia: Secondary | ICD-10-CM

## 2019-06-11 DIAGNOSIS — E872 Acidosis: Secondary | ICD-10-CM | POA: Diagnosis present

## 2019-06-11 DIAGNOSIS — I10 Essential (primary) hypertension: Secondary | ICD-10-CM | POA: Diagnosis present

## 2019-06-11 DIAGNOSIS — Z882 Allergy status to sulfonamides status: Secondary | ICD-10-CM | POA: Diagnosis not present

## 2019-06-11 DIAGNOSIS — N400 Enlarged prostate without lower urinary tract symptoms: Secondary | ICD-10-CM | POA: Diagnosis present

## 2019-06-11 DIAGNOSIS — Z83438 Family history of other disorder of lipoprotein metabolism and other lipidemia: Secondary | ICD-10-CM

## 2019-06-11 DIAGNOSIS — I6529 Occlusion and stenosis of unspecified carotid artery: Secondary | ICD-10-CM | POA: Diagnosis not present

## 2019-06-11 DIAGNOSIS — Z79899 Other long term (current) drug therapy: Secondary | ICD-10-CM

## 2019-06-11 DIAGNOSIS — Z981 Arthrodesis status: Secondary | ICD-10-CM

## 2019-06-11 DIAGNOSIS — Z7189 Other specified counseling: Secondary | ICD-10-CM | POA: Diagnosis not present

## 2019-06-11 DIAGNOSIS — Z7982 Long term (current) use of aspirin: Secondary | ICD-10-CM

## 2019-06-11 DIAGNOSIS — J9601 Acute respiratory failure with hypoxia: Secondary | ICD-10-CM | POA: Diagnosis present

## 2019-06-11 DIAGNOSIS — I160 Hypertensive urgency: Secondary | ICD-10-CM | POA: Diagnosis not present

## 2019-06-11 DIAGNOSIS — Z951 Presence of aortocoronary bypass graft: Secondary | ICD-10-CM

## 2019-06-11 DIAGNOSIS — I252 Old myocardial infarction: Secondary | ICD-10-CM

## 2019-06-11 DIAGNOSIS — Z8261 Family history of arthritis: Secondary | ICD-10-CM

## 2019-06-11 DIAGNOSIS — I132 Hypertensive heart and chronic kidney disease with heart failure and with stage 5 chronic kidney disease, or end stage renal disease: Secondary | ICD-10-CM | POA: Diagnosis present

## 2019-06-11 DIAGNOSIS — L819 Disorder of pigmentation, unspecified: Secondary | ICD-10-CM | POA: Diagnosis present

## 2019-06-11 DIAGNOSIS — Z66 Do not resuscitate: Secondary | ICD-10-CM | POA: Diagnosis not present

## 2019-06-11 DIAGNOSIS — I2510 Atherosclerotic heart disease of native coronary artery without angina pectoris: Secondary | ICD-10-CM | POA: Diagnosis present

## 2019-06-11 DIAGNOSIS — N178 Other acute kidney failure: Secondary | ICD-10-CM | POA: Diagnosis not present

## 2019-06-11 DIAGNOSIS — J449 Chronic obstructive pulmonary disease, unspecified: Secondary | ICD-10-CM | POA: Diagnosis not present

## 2019-06-11 DIAGNOSIS — I5042 Chronic combined systolic (congestive) and diastolic (congestive) heart failure: Secondary | ICD-10-CM | POA: Diagnosis present

## 2019-06-11 DIAGNOSIS — Z79890 Hormone replacement therapy (postmenopausal): Secondary | ICD-10-CM

## 2019-06-11 DIAGNOSIS — E039 Hypothyroidism, unspecified: Secondary | ICD-10-CM | POA: Diagnosis present

## 2019-06-11 DIAGNOSIS — Z9981 Dependence on supplemental oxygen: Secondary | ICD-10-CM

## 2019-06-11 DIAGNOSIS — E785 Hyperlipidemia, unspecified: Secondary | ICD-10-CM | POA: Diagnosis present

## 2019-06-11 DIAGNOSIS — N186 End stage renal disease: Secondary | ICD-10-CM | POA: Diagnosis not present

## 2019-06-11 DIAGNOSIS — Z821 Family history of blindness and visual loss: Secondary | ICD-10-CM

## 2019-06-11 DIAGNOSIS — R062 Wheezing: Secondary | ICD-10-CM | POA: Diagnosis not present

## 2019-06-11 DIAGNOSIS — I5031 Acute diastolic (congestive) heart failure: Secondary | ICD-10-CM | POA: Diagnosis not present

## 2019-06-11 DIAGNOSIS — N185 Chronic kidney disease, stage 5: Secondary | ICD-10-CM | POA: Diagnosis not present

## 2019-06-11 DIAGNOSIS — Z841 Family history of disorders of kidney and ureter: Secondary | ICD-10-CM

## 2019-06-11 DIAGNOSIS — I959 Hypotension, unspecified: Secondary | ICD-10-CM | POA: Diagnosis not present

## 2019-06-11 DIAGNOSIS — Z833 Family history of diabetes mellitus: Secondary | ICD-10-CM

## 2019-06-11 DIAGNOSIS — R0602 Shortness of breath: Secondary | ICD-10-CM | POA: Diagnosis not present

## 2019-06-11 DIAGNOSIS — Z825 Family history of asthma and other chronic lower respiratory diseases: Secondary | ICD-10-CM

## 2019-06-11 DIAGNOSIS — A419 Sepsis, unspecified organism: Secondary | ICD-10-CM | POA: Diagnosis not present

## 2019-06-11 DIAGNOSIS — Z7401 Bed confinement status: Secondary | ICD-10-CM | POA: Diagnosis not present

## 2019-06-11 LAB — CBC WITH DIFFERENTIAL/PLATELET
Abs Immature Granulocytes: 0.03 10*3/uL (ref 0.00–0.07)
Basophils Absolute: 0 10*3/uL (ref 0.0–0.1)
Basophils Relative: 1 %
Eosinophils Absolute: 0.3 10*3/uL (ref 0.0–0.5)
Eosinophils Relative: 5 %
HCT: 25.9 % — ABNORMAL LOW (ref 39.0–52.0)
Hemoglobin: 8.1 g/dL — ABNORMAL LOW (ref 13.0–17.0)
Immature Granulocytes: 1 %
Lymphocytes Relative: 6 %
Lymphs Abs: 0.4 10*3/uL — ABNORMAL LOW (ref 0.7–4.0)
MCH: 31.8 pg (ref 26.0–34.0)
MCHC: 31.3 g/dL (ref 30.0–36.0)
MCV: 101.6 fL — ABNORMAL HIGH (ref 80.0–100.0)
Monocytes Absolute: 0.3 10*3/uL (ref 0.1–1.0)
Monocytes Relative: 4 %
Neutro Abs: 5.2 10*3/uL (ref 1.7–7.7)
Neutrophils Relative %: 83 %
Platelets: 90 10*3/uL — ABNORMAL LOW (ref 150–400)
RBC: 2.55 MIL/uL — ABNORMAL LOW (ref 4.22–5.81)
RDW: 17.5 % — ABNORMAL HIGH (ref 11.5–15.5)
WBC: 6.2 10*3/uL (ref 4.0–10.5)
nRBC: 0.5 % — ABNORMAL HIGH (ref 0.0–0.2)

## 2019-06-11 LAB — COMPREHENSIVE METABOLIC PANEL
ALT: 13 U/L (ref 0–44)
AST: 16 U/L (ref 15–41)
Albumin: 3.5 g/dL (ref 3.5–5.0)
Alkaline Phosphatase: 64 U/L (ref 38–126)
Anion gap: 13 (ref 5–15)
BUN: 112 mg/dL — ABNORMAL HIGH (ref 8–23)
CO2: 16 mmol/L — ABNORMAL LOW (ref 22–32)
Calcium: 8.5 mg/dL — ABNORMAL LOW (ref 8.9–10.3)
Chloride: 107 mmol/L (ref 98–111)
Creatinine, Ser: 4.91 mg/dL — ABNORMAL HIGH (ref 0.61–1.24)
GFR calc Af Amer: 12 mL/min — ABNORMAL LOW (ref >60)
GFR calc non Af Amer: 11 mL/min — ABNORMAL LOW (ref >60)
Glucose, Bld: 105 mg/dL — ABNORMAL HIGH (ref 70–99)
Potassium: 4.8 mmol/L (ref 3.5–5.1)
Sodium: 136 mmol/L (ref 135–145)
Total Bilirubin: 0.8 mg/dL (ref 0.3–1.2)
Total Protein: 8.3 g/dL — ABNORMAL HIGH (ref 6.5–8.1)

## 2019-06-11 LAB — GLUCOSE, CAPILLARY
Glucose-Capillary: 155 mg/dL — ABNORMAL HIGH (ref 70–99)
Glucose-Capillary: 169 mg/dL — ABNORMAL HIGH (ref 70–99)

## 2019-06-11 LAB — RESPIRATORY PANEL BY RT PCR (FLU A&B, COVID)
Influenza A by PCR: NEGATIVE
Influenza B by PCR: NEGATIVE
SARS Coronavirus 2 by RT PCR: NEGATIVE

## 2019-06-11 LAB — TROPONIN I (HIGH SENSITIVITY): Troponin I (High Sensitivity): 27 ng/L — ABNORMAL HIGH (ref <18)

## 2019-06-11 LAB — BRAIN NATRIURETIC PEPTIDE: B Natriuretic Peptide: 963 pg/mL — ABNORMAL HIGH (ref 0.0–100.0)

## 2019-06-11 LAB — HEMOGLOBIN A1C
Hgb A1c MFr Bld: 8.3 % — ABNORMAL HIGH (ref 4.8–5.6)
Mean Plasma Glucose: 191.51 mg/dL

## 2019-06-11 LAB — MRSA PCR SCREENING: MRSA by PCR: NEGATIVE

## 2019-06-11 MED ORDER — NITROGLYCERIN 2 % TD OINT
1.0000 [in_us] | TOPICAL_OINTMENT | Freq: Once | TRANSDERMAL | Status: AC
  Administered 2019-06-11: 1 [in_us] via TOPICAL
  Filled 2019-06-11: qty 1

## 2019-06-11 MED ORDER — ISOSORBIDE DINITRATE 20 MG PO TABS
40.0000 mg | ORAL_TABLET | Freq: Three times a day (TID) | ORAL | Status: DC
  Administered 2019-06-11 – 2019-06-14 (×9): 40 mg via ORAL
  Filled 2019-06-11 (×7): qty 2
  Filled 2019-06-11: qty 4
  Filled 2019-06-11: qty 2

## 2019-06-11 MED ORDER — NITROGLYCERIN IN D5W 200-5 MCG/ML-% IV SOLN
150.0000 ug/min | INTRAVENOUS | Status: DC
  Administered 2019-06-11: 21:00:00 150 ug/min via INTRAVENOUS
  Administered 2019-06-11: 185 ug/min via INTRAVENOUS
  Administered 2019-06-11: 08:00:00 10 ug/min via INTRAVENOUS
  Administered 2019-06-12 (×2): 150 ug/min via INTRAVENOUS
  Filled 2019-06-11 (×3): qty 250

## 2019-06-11 MED ORDER — FINASTERIDE 5 MG PO TABS
5.0000 mg | ORAL_TABLET | Freq: Every day | ORAL | Status: DC
  Administered 2019-06-11 – 2019-06-14 (×4): 5 mg via ORAL
  Filled 2019-06-11 (×4): qty 1

## 2019-06-11 MED ORDER — FUROSEMIDE 10 MG/ML IJ SOLN
100.0000 mg | Freq: Four times a day (QID) | INTRAVENOUS | Status: DC
  Administered 2019-06-11: 100 mg via INTRAVENOUS
  Filled 2019-06-11 (×5): qty 10

## 2019-06-11 MED ORDER — CLONIDINE HCL 0.2 MG PO TABS
0.3000 mg | ORAL_TABLET | Freq: Three times a day (TID) | ORAL | Status: DC
  Administered 2019-06-11 – 2019-06-14 (×8): 0.3 mg via ORAL
  Filled 2019-06-11 (×8): qty 1

## 2019-06-11 MED ORDER — CHLORHEXIDINE GLUCONATE CLOTH 2 % EX PADS
6.0000 | MEDICATED_PAD | Freq: Every day | CUTANEOUS | Status: DC
  Administered 2019-06-11 – 2019-06-14 (×4): 6 via TOPICAL

## 2019-06-11 MED ORDER — CLONIDINE HCL 0.2 MG PO TABS
0.2000 mg | ORAL_TABLET | Freq: Three times a day (TID) | ORAL | Status: DC
  Administered 2019-06-11: 16:00:00 0.2 mg via ORAL
  Filled 2019-06-11: qty 1

## 2019-06-11 MED ORDER — ALBUTEROL SULFATE HFA 108 (90 BASE) MCG/ACT IN AERS
8.0000 | INHALATION_SPRAY | Freq: Once | RESPIRATORY_TRACT | Status: AC
  Administered 2019-06-11: 8 via RESPIRATORY_TRACT
  Filled 2019-06-11: qty 6.7

## 2019-06-11 MED ORDER — FUROSEMIDE 10 MG/ML IJ SOLN
80.0000 mg | Freq: Once | INTRAMUSCULAR | Status: AC
  Administered 2019-06-11: 80 mg via INTRAVENOUS
  Filled 2019-06-11: qty 8

## 2019-06-11 MED ORDER — ACETAMINOPHEN 325 MG PO TABS
650.0000 mg | ORAL_TABLET | Freq: Four times a day (QID) | ORAL | Status: DC | PRN
  Administered 2019-06-13 – 2019-06-14 (×3): 650 mg via ORAL
  Filled 2019-06-11 (×3): qty 2

## 2019-06-11 MED ORDER — ACETAMINOPHEN 650 MG RE SUPP: 650.0000 mg | Freq: Four times a day (QID) | RECTAL | Status: DC | PRN

## 2019-06-11 MED ORDER — TAMSULOSIN HCL 0.4 MG PO CAPS
0.8000 mg | ORAL_CAPSULE | Freq: Every day | ORAL | Status: DC
  Administered 2019-06-11 – 2019-06-14 (×4): 0.8 mg via ORAL
  Filled 2019-06-11 (×4): qty 2

## 2019-06-11 MED ORDER — DOXAZOSIN MESYLATE 2 MG PO TABS
4.0000 mg | ORAL_TABLET | Freq: Two times a day (BID) | ORAL | Status: DC
  Administered 2019-06-11 – 2019-06-14 (×7): 4 mg via ORAL
  Filled 2019-06-11 (×7): qty 2

## 2019-06-11 MED ORDER — INSULIN ASPART 100 UNIT/ML ~~LOC~~ SOLN
0.0000 [IU] | Freq: Three times a day (TID) | SUBCUTANEOUS | Status: DC
  Administered 2019-06-11: 1 [IU] via SUBCUTANEOUS
  Administered 2019-06-12: 2 [IU] via SUBCUTANEOUS
  Administered 2019-06-12: 1 [IU] via SUBCUTANEOUS
  Administered 2019-06-12 – 2019-06-14 (×5): 2 [IU] via SUBCUTANEOUS
  Administered 2019-06-14: 10:00:00 1 [IU] via SUBCUTANEOUS

## 2019-06-11 MED ORDER — ALLOPURINOL 100 MG PO TABS
100.0000 mg | ORAL_TABLET | Freq: Every day | ORAL | Status: DC
  Administered 2019-06-11 – 2019-06-14 (×4): 100 mg via ORAL
  Filled 2019-06-11 (×4): qty 1

## 2019-06-11 MED ORDER — GABAPENTIN 100 MG PO CAPS
100.0000 mg | ORAL_CAPSULE | Freq: Two times a day (BID) | ORAL | Status: DC
  Administered 2019-06-11 – 2019-06-14 (×7): 100 mg via ORAL
  Filled 2019-06-11 (×7): qty 1

## 2019-06-11 MED ORDER — LABETALOL HCL 5 MG/ML IV SOLN
20.0000 mg | INTRAVENOUS | Status: DC | PRN
  Administered 2019-06-11 – 2019-06-13 (×6): 20 mg via INTRAVENOUS
  Filled 2019-06-11 (×6): qty 4

## 2019-06-11 MED ORDER — HEPARIN SODIUM (PORCINE) 5000 UNIT/ML IJ SOLN
5000.0000 [IU] | Freq: Three times a day (TID) | INTRAMUSCULAR | Status: DC
  Administered 2019-06-11 – 2019-06-14 (×10): 5000 [IU] via SUBCUTANEOUS
  Filled 2019-06-11 (×10): qty 1

## 2019-06-11 MED ORDER — MELATONIN 3 MG PO TABS
6.0000 mg | ORAL_TABLET | Freq: Every evening | ORAL | Status: DC | PRN
  Administered 2019-06-11 – 2019-06-13 (×2): 6 mg via ORAL
  Filled 2019-06-11 (×2): qty 2

## 2019-06-11 MED ORDER — FUROSEMIDE 10 MG/ML IJ SOLN
160.0000 mg | Freq: Three times a day (TID) | INTRAVENOUS | Status: DC
  Administered 2019-06-11 – 2019-06-12 (×2): 160 mg via INTRAVENOUS
  Filled 2019-06-11 (×6): qty 16

## 2019-06-11 MED ORDER — ROSUVASTATIN CALCIUM 20 MG PO TABS
20.0000 mg | ORAL_TABLET | Freq: Every evening | ORAL | Status: DC
  Administered 2019-06-11 – 2019-06-13 (×3): 20 mg via ORAL
  Filled 2019-06-11 (×3): qty 1

## 2019-06-11 MED ORDER — ONDANSETRON HCL 4 MG PO TABS: 4.0000 mg | ORAL_TABLET | Freq: Four times a day (QID) | ORAL | Status: DC | PRN

## 2019-06-11 MED ORDER — HYDRALAZINE HCL 25 MG PO TABS
100.0000 mg | ORAL_TABLET | Freq: Three times a day (TID) | ORAL | Status: DC
  Administered 2019-06-11 – 2019-06-14 (×12): 100 mg via ORAL
  Filled 2019-06-11 (×11): qty 4

## 2019-06-11 MED ORDER — SODIUM BICARBONATE 650 MG PO TABS
1300.0000 mg | ORAL_TABLET | Freq: Two times a day (BID) | ORAL | Status: DC
  Administered 2019-06-11 – 2019-06-12 (×3): 1300 mg via ORAL
  Filled 2019-06-11 (×5): qty 2

## 2019-06-11 MED ORDER — ASPIRIN EC 81 MG PO TBEC
81.0000 mg | DELAYED_RELEASE_TABLET | Freq: Every day | ORAL | Status: DC
  Administered 2019-06-11 – 2019-06-14 (×4): 81 mg via ORAL
  Filled 2019-06-11 (×4): qty 1

## 2019-06-11 MED ORDER — FLUTICASONE PROPIONATE 50 MCG/ACT NA SUSP
2.0000 | Freq: Every day | NASAL | Status: DC
  Administered 2019-06-11 – 2019-06-14 (×4): 2 via NASAL
  Filled 2019-06-11: qty 16

## 2019-06-11 MED ORDER — LEVOTHYROXINE SODIUM 100 MCG PO TABS
100.0000 ug | ORAL_TABLET | Freq: Every day | ORAL | Status: DC
  Administered 2019-06-11 – 2019-06-14 (×4): 100 ug via ORAL
  Filled 2019-06-11 (×4): qty 1

## 2019-06-11 MED ORDER — INSULIN ASPART 100 UNIT/ML ~~LOC~~ SOLN: 0.0000 [IU] | Freq: Every day | SUBCUTANEOUS | Status: DC

## 2019-06-11 MED ORDER — AMLODIPINE BESYLATE 5 MG PO TABS
10.0000 mg | ORAL_TABLET | Freq: Every day | ORAL | Status: DC
  Administered 2019-06-11 – 2019-06-14 (×4): 10 mg via ORAL
  Filled 2019-06-11 (×4): qty 2

## 2019-06-11 MED ORDER — ONDANSETRON HCL 4 MG/2ML IJ SOLN: 4.0000 mg | Freq: Four times a day (QID) | INTRAMUSCULAR | Status: DC | PRN

## 2019-06-11 MED ORDER — CARVEDILOL 12.5 MG PO TABS
25.0000 mg | ORAL_TABLET | Freq: Two times a day (BID) | ORAL | Status: DC
  Administered 2019-06-11 – 2019-06-14 (×6): 25 mg via ORAL
  Filled 2019-06-11 (×4): qty 2
  Filled 2019-06-11: qty 8
  Filled 2019-06-11: qty 2

## 2019-06-11 NOTE — ED Notes (Signed)
Per Dr. Reather Converse, increase nitro drip to 50 mcg/min now and then titrate by 10 mcg/3 minutes until pt BP systolically [id number].

## 2019-06-11 NOTE — H&P (Signed)
History and Physical  [name]. EVO:[id number] DOB: 1945-09-16 DOA: 06/11/2019   PCP: Claretta Fraise, MD   Patient coming from: Home  Chief Complaint: dyspnea  HPI:  [name]. is a 74 y.o. male with medical history of CKD stage V, coronary artery disease, COPD, diabetes mellitus type 2, hyperlipidemia, hypothyroidism, carotid stenosis presenting with 2-week history of shortness of breath that significantly worsened on the a.m. 06/11/2019.  The patient denies any fevers, chills, chest pain, nausea, vomiting, diarrhea, abdominal pain.  He also complains of increasing lower extremity edema, increasing abdominal girth, and orthopnea type symptoms.  He endorses compliance with all his medications.  He has a nonproductive cough, but denies hemoptysis.  He has told all his physicians that he is aware of his worsening renal function and does not want to pursue dialysis.  He came to the emergency department at the insistence of his spouse. In the emergency department, the patient was afebrile hemodynamically stable.  He required 4 L nasal cannula to keep his oxygen saturation near 100%.  Initial blood pressure was 202/81.  LFTs were unremarkable.  BMP showed a serum creatinine 4.91 and potassium 4.8.  WBC 6.2, hemoglobin 8.1, platelets 90,000.  BNP was 963.  Chest x-ray show pulmonary edema.  Assessment/Plan: Acute on chronic renal failure--CKD stage V -Nephrology consult -Patient has capacity to make his own decisions, and he has expressed he does not want to be initiated on dialysis -Start furosemide 160 mg IV every 8 hours -Accurate I's and O's  Acute respiratory failure with hypoxia -Secondary to pulmonary edema -Stable on 4 L nasal cannula  Hypertensive urgency -Continue home medications including clonidine, hydralazine, Isordil, carvedilol, amlodipine  Diabetes mellitus type 2 -Hemoglobin A1c -NovoLog sliding scale -Holding Lantus presently  BPH -Continue tamsulosin  and finasteride  Coronary artery disease -No chest pain presently -EKG without concerning ischemic changes -Continue aspirin, carvedilol  Hypothyroidism -continue Synthroid   Goals of care discussion -Patient has capacity to make his own decisions presently -DNR confirmed Advance care planning, including the explanation and discussion of advance directives was carried out with the patient and family.  Code status including explanations of "Full Code" and "DNR" and alternatives were discussed in detail.  Discussion of end-of-life issues including but not limited palliative care, hospice care and the concept of hospice, other end-of-life care options, power of attorney for health care decisions, living wills, and physician orders for life-sustaining treatment were also discussed with the patient and family.  Total face to face time 16 minutes.        Past Medical History:  Diagnosis Date  . Acute blood loss anemia   . Acute pulmonary edema (HCC)   . CAD (coronary artery disease), native coronary artery    s/p NSTEMI and CABG x 3 (LIMA to LAD, SVG to diagonal, SVG to dRCA) on 08/05/16  . Carotid artery occlusion   . Chronic kidney disease   . Diabetes mellitus without complication ([place])   . Hypertension   . NSTEMI (non-ST elevated myocardial infarction) ([place]) 07/25/2016  . S/P CABG (coronary artery bypass graft)    Past Surgical History:  Procedure Laterality Date  . APPENDECTOMY    . CORONARY ARTERY BYPASS GRAFT N/A 08/05/2016   Procedure: CORONARY ARTERY BYPASS GRAFTING (CABG) x 3, LIMA to LAD, SVG to DIAGONAL, SVG to OM1, SVG to DISTAL RCA, USING LEFT MAMMARY ARTERY AND RIGHT GREATER SAPHENOUS VEIN HARVESTED ENDOSCOPICALLY;  Surgeon: [name], MD;  Location: [facility] OR;  Service: Open Heart Surgery;  Laterality: N/A;  . JOINT REPLACEMENT Bilateral    hip replacements  . LEFT HEART CATH AND CORONARY ANGIOGRAPHY N/A 07/28/2016   Procedure: Left Heart Cath and Coronary  Angiography;  Surgeon: Jettie Booze, MD;  Location: [facility];  Service: Cardiovascular;  Laterality: N/A;  . LUMBAR FUSION  2005  . TEE WITHOUT CARDIOVERSION N/A 08/05/2016   Procedure: TRANSESOPHAGEAL ECHOCARDIOGRAM (TEE);  Surgeon: [name], MD;  Location: [facility];  Service: Open Heart Surgery;  Laterality: N/A;  . TONSILECTOMY, ADENOIDECTOMY, BILATERAL MYRINGOTOMY AND TUBES     Social History:  reports that he quit smoking about 41 years ago. He has never used smokeless tobacco. He reports that he does not drink alcohol or use drugs.   Family History  Problem Relation Age of Onset  . Heart disease Mother   . Hypertension Mother   . Arthritis Mother   . Cancer Mother   . Diabetes Mother   . Hyperlipidemia Mother   . Kidney disease Mother   . Hypertension Father   . Arthritis Father   . Cancer Father   . Diabetes Father   . Hyperlipidemia Father   . Hyperlipidemia Sister   . Hypertension Sister   . Vision loss Sister   . Hyperlipidemia Brother   . Hypertension Brother   . Arthritis Maternal Grandmother   . Arthritis Maternal Grandfather   . Arthritis Paternal Grandmother   . Arthritis Paternal Grandfather   . Asthma Sister   . Hyperlipidemia Sister   . Hypertension Sister   . Cancer Brother   . Diabetes Brother   . Hyperlipidemia Brother   . Hypertension Brother   . Cancer Brother   . Hyperlipidemia Brother   . Hypertension Brother   . Heart attack Son        45     Allergies  Allergen Reactions  . Sulfa Antibiotics Hives     Prior to Admission medications   Medication Sig Start Date End Date Taking? Authorizing Provider  acetaminophen (TYLENOL) 325 MG tablet Take 2 tablets (650 mg total) by mouth every 6 (six) hours as needed for mild pain. 08/16/16   Nani Skillern, PA-C  allopurinol (ZYLOPRIM) 100 MG tablet Take 1 tablet (100 mg total) by mouth daily. 03/12/19   Claretta Fraise, MD  AMITIZA 24 MCG capsule Take 1 capsule (24 mcg  total) by mouth 2 (two) times daily. 04/30/19   Claretta Fraise, MD  amLODipine (NORVASC) 10 MG tablet Take 1 tablet (10 mg total) by mouth daily. 03/12/19   Claretta Fraise, MD  aspirin 81 MG EC tablet Take 81 mg by mouth daily. Swallow whole.    [provider]  betamethasone valerate (VALISONE) 0.1 % cream USE AS DIRECTED 01/04/19   Claretta Fraise, MD  carvedilol (COREG) 25 MG tablet TAKE 1 & 1/2 TABLET BY MOUTH TWICE A DAY 03/12/19   Claretta Fraise, MD  cloNIDine (CATAPRES) 0.1 MG tablet TAKE 2 TABLETS BY MOUTH 3 TIMES A DAY. 03/12/19   Claretta Fraise, MD  doxazosin (CARDURA) 4 MG tablet Take 1 tablet (4 mg total) by mouth 2 (two) times daily. 03/12/19   Claretta Fraise, MD  finasteride (PROSCAR) 5 MG tablet Take 1 tablet (5 mg total) by mouth daily. 03/12/19   Claretta Fraise, MD  fluticasone Asencion Islam) 50 MCG/ACT nasal spray USE 2 SPRAYS IN EACH NOSTRIL AT BEDTIME 03/12/19   Claretta Fraise, MD  furosemide (LASIX) 80 MG  tablet Two on awakening in the morning and one at lunchtime 03/12/19   Claretta Fraise, MD  gabapentin (NEURONTIN) 100 MG capsule TAKE 1 CAPSULE (100 MG TOTAL) BY MOUTH THREE (3) TIMES A DAY. 03/12/19   Claretta Fraise, MD  hydrALAZINE (APRESOLINE) 100 MG tablet Take 1 tablet (100 mg total) by mouth 4 (four) times daily -  before meals and at bedtime. 03/12/19   Claretta Fraise, MD  Insulin Glargine (LANTUS SOLOSTAR) 100 UNIT/ML Solostar Pen Inject 12 Units into the skin daily. At 7 am 03/12/19   Claretta Fraise, MD  isosorbide dinitrate (ISORDIL) 20 MG tablet Take 2 tablets (40 mg total) by mouth 3 (three) times daily. 03/12/19   Claretta Fraise, MD  levothyroxine (SYNTHROID) 100 MCG tablet Take 1 tablet (100 mcg total) by mouth daily. 04/30/19   Claretta Fraise, MD  [facility] ULTRA test strip USE AS INSTRUCTED 06/06/19   Claretta Fraise, MD  rosuvastatin (CRESTOR) 20 MG tablet Take 1 tablet (20 mg total) by mouth every evening. 03/12/19   Claretta Fraise, MD  tamsulosin (FLOMAX) 0.4 MG CAPS  capsule Take 2 capsules (0.8 mg total) by mouth daily. 03/12/19   Claretta Fraise, MD    Review of Systems:  Constitutional:  No weight loss, night sweats, Fevers, chills, fatigue.  Head&Eyes: No headache.  No vision loss.  No eye pain or scotoma ENT:  No Difficulty swallowing,Tooth/dental problems,Sore throat,  No ear ache, post nasal drip,  Cardio-vascular:  No chest pain,dizziness, palpitations  GI:  No  abdominal pain, nausea, vomiting, diarrhea, loss of appetite, hematochezia, melena, heartburn, indigestion, Resp:  No shortness of breath with exertion or at rest. No cough. No coughing up of blood .No wheezing.No chest wall deformity  Skin:  no rash or lesions.  GU:  no dysuria, change in color of urine, no urgency or frequency. No flank pain.  Musculoskeletal:  No joint pain or swelling. No decreased range of motion. No back pain.  Psych:  No change in mood or affect. No depression or anxiety. Neurologic: No headache, no dysesthesia, no focal weakness, no vision loss. No syncope  Physical Exam: Vitals:   06/11/19 0830 06/11/19 0840 06/11/19 0845 06/11/19 0930  BP: (!) 177/85 (!) 206/67 (!) 196/71 (!) 196/71  Pulse: 68 69 64 69  Resp: 16 16 17  (!) 21  Temp:      TempSrc:      SpO2: 94% 100% 100% 99%  Weight:      Height:       General:  A&O x 3, NAD, nontoxic, pleasant/cooperative Head/Eye: No conjunctival hemorrhage, no icterus, [place]/AT, No nystagmus ENT:  No icterus,  No thrush, good dentition, no pharyngeal exudate Neck:  No masses, no lymphadenpathy, no bruits CV:  RRR, no rub, no gallop, no S3 Lung: Bilateral rales.  No wheezing. Abdomen: soft/NT, +BS, nondistended, no peritoneal signs Ext: No cyanosis, No rashes, No petechiae, No lymphangitis, 3+LE edema Neuro: CNII-XII intact, strength 4/5 in bilateral upper and lower extremities, no dysmetria  Labs on Admission:  Basic Metabolic Panel: Recent Labs  Lab 06/11/19 0744  NA 136  K 4.8  CL 107  CO2 16*    GLUCOSE 105*  BUN 112*  CREATININE 4.91*  CALCIUM 8.5*   Liver Function Tests: Recent Labs  Lab 06/11/19 0744  AST 16  ALT 13  ALKPHOS 64  BILITOT 0.8  PROT 8.3*  ALBUMIN 3.5   No results for input(s): LIPASE, AMYLASE in the last 168 hours. No results for input(s): AMMONIA  in the last 168 hours. CBC: Recent Labs  Lab 06/11/19 0744  WBC 6.2  NEUTROABS 5.2  HGB 8.1*  HCT 25.9*  MCV 101.6*  PLT 90*   Coagulation Profile: No results for input(s): INR, PROTIME in the last 168 hours. Cardiac Enzymes: No results for input(s): CKTOTAL, CKMB, CKMBINDEX, TROPONINI in the last 168 hours. BNP: Invalid input(s): POCBNP CBG: No results for input(s): GLUCAP in the last 168 hours. Urine analysis:    Component Value Date/Time   COLORURINE STRAW (A) 12/02/2016 0250   APPEARANCEUR Clear 12/19/2018 1100   LABSPEC 1.009 12/02/2016 0250   PHURINE 5.0 12/02/2016 0250   GLUCOSEU Negative 12/19/2018 1100   HGBUR NEGATIVE 12/02/2016 0250   BILIRUBINUR Negative 12/19/2018 1100   KETONESUR NEGATIVE 12/02/2016 0250   PROTEINUR 1+ (A) 12/19/2018 1100   PROTEINUR NEGATIVE 12/02/2016 0250   UROBILINOGEN 0.2 10/03/2006 1445   NITRITE Negative 12/19/2018 1100   NITRITE NEGATIVE 12/02/2016 0250   LEUKOCYTESUR Negative 12/19/2018 1100   Sepsis Labs: @LABRCNTIP (procalcitonin:4,lacticidven:4) ) Recent Results (from the past 240 hour(s))  Respiratory Panel by RT PCR (Flu A&B, Covid) - Nasopharyngeal Swab     Status: None   Collection Time: 06/11/19  8:15 AM   Specimen: Nasopharyngeal Swab  Result Value Ref Range Status   SARS Coronavirus 2 by RT PCR NEGATIVE NEGATIVE Final    Comment: (NOTE) SARS-CoV-2 target nucleic acids are NOT DETECTED. The SARS-CoV-2 RNA is generally detectable in upper respiratoy specimens during the acute phase of infection. The lowest concentration of SARS-CoV-2 viral copies this assay can detect is 131 copies/mL. A negative result does not preclude  SARS-Cov-2 infection and should not be used as the sole basis for treatment or other patient management decisions. A negative result may occur with  improper specimen collection/handling, submission of specimen other than nasopharyngeal swab, presence of viral mutation(s) within the areas targeted by this assay, and inadequate number of viral copies (<131 copies/mL). A negative result must be combined with clinical observations, patient history, and epidemiological information. The expected result is Negative. Fact Sheet for Patients:  PinkCheek.be Fact Sheet for Healthcare Providers:  GravelBags.it This test is not yet ap proved or cleared by the [place] FDA and  has been authorized for detection and/or diagnosis of SARS-CoV-2 by FDA under an Emergency Use Authorization (EUA). This EUA will remain  in effect (meaning this test can be used) for the duration of the COVID-19 declaration under Section 564(b)(1) of the Act, 21 U.S.C. section 360bbb-3(b)(1), unless the authorization is terminated or revoked sooner.    Influenza A by PCR NEGATIVE NEGATIVE Final   Influenza B by PCR NEGATIVE NEGATIVE Final    Comment: (NOTE) The Xpert Xpress SARS-CoV-2/FLU/RSV assay is intended as an aid in  the diagnosis of influenza from Nasopharyngeal swab specimens and  should not be used as a sole basis for treatment. Nasal washings and  aspirates are unacceptable for Xpert Xpress SARS-CoV-2/FLU/RSV  testing. Fact Sheet for Patients: PinkCheek.be Fact Sheet for Healthcare Providers: GravelBags.it This test is not yet approved or cleared by the [place] FDA and  has been authorized for detection and/or diagnosis of SARS-CoV-2 by  FDA under an Emergency Use Authorization (EUA). This EUA will remain  in effect (meaning this test can be used) for the duration of the  Covid-19  declaration under Section 564(b)(1) of the Act, 21  U.S.C. section 360bbb-3(b)(1), unless the authorization is  terminated or revoked. Performed at [facility], [address], [place], [place]  [place] on Admission: DG Chest Port 1 View  Result Date: 06/11/2019 CLINICAL DATA:  Shortness of breath EXAM: PORTABLE CHEST 1 VIEW COMPARISON:  12/02/2016 FINDINGS: Cardiomegaly. 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 Diffuse interstitial opacity with Kerley lines. Perihilar airspace disease that is likely alveolar edema. No visible effusion or pneumothorax. IMPRESSION: CHF. Electronically Signed   By: Monte Fantasia M.D.   On: 06/11/2019 07:20    EKG: Independently reviewed. Sinus, nonspecific twi    Time spent:60 minutes Code Status:   DNR Family Communication:  Spouse updated at bedside 4/27 Disposition Plan: expect 2-3 day hospitalization Consults called: renal; palliative DVT Prophylaxis: Gerald Stabs, DO  [facility] Pager [phone]  If 7PM-7AM, please contact night-coverage [email or web address] Password [facility] 06/11/2019, 9:53 AM

## 2019-06-11 NOTE — Progress Notes (Signed)
Spoke with Hospitalist regarding patients SBP being 180-206 despite titrating IV NTG and giving multiple ordered PO blood pressure medication. Stated goal SBP is < 180 - new order for medication adjustment and PRN medication received.

## 2019-06-11 NOTE — Progress Notes (Signed)
**Note De-Identified Malory Spurr Obfuscation** RT note: per Dr. Carles Collet, hold BIPAP.  RRT to continue to monitor.

## 2019-06-11 NOTE — ED Notes (Signed)
Pharmacy notified of patient Lasix drip due. Donna Christen, pharmacist, states they will bring down med.

## 2019-06-11 NOTE — Consult Note (Signed)
Consultation Note Date: 06/11/2019   Patient Name: [name].  DOB: Jun 10, 1945  MRN: [id number]  Age / Sex: 74 y.o., male  PCP: Claretta Fraise, MD Referring Physician: Orson Eva, MD  Reason for Consultation: Establishing goals of care and Psychosocial/spiritual support  HPI/Patient Profile: 74 y.o. male  with past medical history of chronic kidney disease stage V declines to take dialysis with multiple providers, DM, HTN, acute pulmonary edema, coronary artery disease NSTEMI with CABG three-vessel 2018, carotid artery occlusion, bilateral hip replacements, lumbar fusion 2005, admitted on 06/11/2019 with acute on chronic renal failure and acute respiratory failure with hypoxia.   Clinical Assessment and Goals of Care: I have reviewed medical records including EPIC notes, labs and imaging, received report from attending and bedside nursing, examined the patient and met at bedside to discuss diagnosis prognosis, GOC, EOL wishes, disposition and options.  I introduced Palliative Medicine as specialized medical care for people living with serious illness. It focuses on providing relief from the symptoms and stress of a serious illness.   We discussed a brief life review of the patient.  He has been married to [name] for 43 years, and they have 2 sons, [name] and [name].   We discussed her current illness and what it means in the larger context of her on-going co-morbidities.  Natural disease trajectory and expectations at EOL were discussed.  We do not talk about HD, and I reassure Mr. Sky that we understand and accept his choice to not have HD.    The difference between aggressive medical intervention and comfort care was considered in light of the patient's goals of care. Mr. Cory tells me that he is unsure that he will leave the hospital.  We talk about his acute illness and the treatment plan.    Advanced  directives, concepts specific to code status, were considered and discussed.  Mr. Kimberling tells me that he wants to treat the treatable but no CPR.  He tells me that his wife will accept this.   Hospice and Palliative Care services outpatient were explained and offered.  Questions and concerns were addressed.  Patient was encouraged to call with questions or concerns.   PMT to follow.     HCPOA   NEXT OF KIN - wife of 24 years, [name].  They have 2 sons [name] and "[employer], general practice.    SUMMARY OF RECOMMENDATIONS   Continue to treat the treatable, but no CPR or intubation NO hemodialysis.   Code Status/Advance Care Planning:  DNR  Symptom Management:   Per hospitalist, no additional needs at this time.  Palliative Prophylaxis:   Oral Care and Turn Reposition  Additional Recommendations (Limitations, Scope, Preferences):  No Hemodialysis and Treat the treatable but no CPR or intubation  Psycho-social/Spiritual:   Desire for further Chaplaincy support:no  Additional Recommendations: Caregiving  Support/Resources and Education on Hospice  Prognosis:   Unable to determine, based on outcomes.  Guarded for the next 24 to 48 hours.  If no turnaround from fluid overload,  likely days.  Discharge Planning: To be determined, based on outcomes.      Primary Diagnoses: Present on Admission: . Acute respiratory failure with hypoxia ([place]) . Essential hypertension   I have reviewed the medical record, interviewed the patient and family, and examined the patient. The following aspects are pertinent.  Past Medical History:  Diagnosis Date  . Acute blood loss anemia   . Acute pulmonary edema (H[facility])   . CAD (coronary artery disease), native coronary artery    s/p NSTEMI and CABG x 3 (LIMA to LAD, SVG to diagonal, SVG to dRCA) on 08/05/16  . Carotid artery occlusion   . Chronic kidney disease   . Diabetes mellitus without complication ([place])   . Hypertension   . NSTEMI (non-ST  elevated myocardial infarction) ([place]) 07/25/2016  . S/P CABG (coronary artery bypass graft)    Social History   Socioeconomic History  . Marital status: Married    Spouse name: [name]  . Number of children: 2  . Years of education: 41  . Highest education level: 11th grade  Occupational History  . Occupation: Retired    Comment: Chief Technology Officer  Tobacco Use  . Smoking status: Former Smoker    Quit date: 1980    Years since quitting: 41.3  . Smokeless tobacco: Never Used  Substance and Sexual Activity  . Alcohol use: No  . Drug use: No  . Sexual activity: Yes  Other Topics Concern  . Not on file  Social History Narrative  . Not on file   Social Determinants of Health   Financial Resource Strain:   . Difficulty of Paying Living Expenses:   Food Insecurity:   . Worried About Charity fundraiser in the Last Year:   . Arboriculturist in the Last Year:   Transportation Needs:   . Film/video editor (Medical):   Marland Kitchen Lack of Transportation (Non-Medical):   Physical Activity:   . Days of Exercise per Week:   . Minutes of Exercise per Session:   Stress:   . Feeling of Stress :   Social Connections:   . Frequency of Communication with Friends and Family:   . Frequency of Social Gatherings with Friends and Family:   . Attends Religious Services:   . Active Member of Clubs or Organizations:   . Attends Archivist Meetings:   Marland Kitchen Marital Status:    Family History  Problem Relation Age of Onset  . Heart disease Mother   . Hypertension Mother   . Arthritis Mother   . Cancer Mother   . Diabetes Mother   . Hyperlipidemia Mother   . Kidney disease Mother   . Hypertension Father   . Arthritis Father   . Cancer Father   . Diabetes Father   . Hyperlipidemia Father   . Hyperlipidemia Sister   . Hypertension Sister   . Vision loss Sister   . Hyperlipidemia Brother   . Hypertension Brother   . Arthritis Maternal Grandmother   . Arthritis  Maternal Grandfather   . Arthritis Paternal Grandmother   . Arthritis Paternal Grandfather   . Asthma Sister   . Hyperlipidemia Sister   . Hypertension Sister   . Cancer Brother   . Diabetes Brother   . Hyperlipidemia Brother   . Hypertension Brother   . Cancer Brother   . Hyperlipidemia Brother   . Hypertension Brother   . Heart attack Son        55  Scheduled Meds: . allopurinol  100 mg Oral Daily  . amLODipine  10 mg Oral Daily  . aspirin EC  81 mg Oral Daily  . carvedilol  25 mg Oral BID WC  . cloNIDine  0.2 mg Oral TID  . doxazosin  4 mg Oral BID  . finasteride  5 mg Oral Daily  . fluticasone  2 spray Each Nare Daily  . gabapentin  100 mg Oral BID  . heparin  5,000 Units Subcutaneous Q8H  . hydrALAZINE  100 mg Oral TID AC & HS  . insulin aspart  0-5 Units Subcutaneous QHS  . insulin aspart  0-6 Units Subcutaneous TID WC  . isosorbide dinitrate  40 mg Oral TID  . levothyroxine  100 mcg Oral Daily  . rosuvastatin  20 mg Oral QPM  . sodium bicarbonate  1,300 mg Oral BID  . tamsulosin  0.8 mg Oral Daily   Continuous Infusions: . furosemide    . nitroGLYCERIN 170 mcg/min (06/11/19 1402)   PRN Meds:.acetaminophen **OR** acetaminophen, ondansetron **OR** ondansetron (ZOFRAN) IV Medications Prior to Admission:  Prior to Admission medications   Medication Sig Start Date End Date Taking? Authorizing Provider  acetaminophen (TYLENOL) 325 MG tablet Take 2 tablets (650 mg total) by mouth every 6 (six) hours as needed for mild pain. 08/16/16  Yes Lars Pinks M, PA-C  allopurinol (ZYLOPRIM) 100 MG tablet Take 1 tablet (100 mg total) by mouth daily. 03/12/19  Yes Claretta Fraise, MD  AMITIZA 24 MCG capsule Take 1 capsule (24 mcg total) by mouth 2 (two) times daily. 04/30/19  Yes Stacks, Cletus Gash, MD  amLODipine (NORVASC) 10 MG tablet Take 1 tablet (10 mg total) by mouth daily. 03/12/19  Yes Claretta Fraise, MD  aspirin 81 MG EC tablet Take 81 mg by mouth daily. Swallow whole.    Yes [provider]  betamethasone valerate (VALISONE) 0.1 % cream USE AS DIRECTED Patient taking differently: Apply 1 application topically daily as needed.  01/04/19  Yes Stacks, Cletus Gash, MD  carvedilol (COREG) 25 MG tablet TAKE 1 & 1/2 TABLET BY MOUTH TWICE A DAY Patient taking differently: Take 37.5 mg by mouth in the morning and at bedtime. TAKE 1 & 1/2 TABLET BY MOUTH TWICE A DAY 03/12/19  Yes Stacks, Cletus Gash, MD  cloNIDine (CATAPRES) 0.1 MG tablet TAKE 2 TABLETS BY MOUTH 3 TIMES A DAY. Patient taking differently: Take 0.2 mg by mouth 3 (three) times daily. TAKE 2 TABLETS BY MOUTH 3 TIMES A DAY. 03/12/19  Yes Claretta Fraise, MD  doxazosin (CARDURA) 4 MG tablet Take 1 tablet (4 mg total) by mouth 2 (two) times daily. 03/12/19  Yes Claretta Fraise, MD  finasteride (PROSCAR) 5 MG tablet Take 1 tablet (5 mg total) by mouth daily. 03/12/19  Yes Stacks, Cletus Gash, MD  fluticasone (FLONASE) 50 MCG/ACT nasal spray USE 2 SPRAYS IN EACH NOSTRIL AT BEDTIME Patient taking differently: Place 2 sprays into both nostrils daily. USE 2 SPRAYS IN EACH NOSTRIL AT BEDTIME 03/12/19  Yes Claretta Fraise, MD  furosemide (LASIX) 80 MG tablet Two on awakening in the morning and one at lunchtime Patient taking differently: Take 80-160 mg by mouth 2 (two) times daily. Two on awakening in the morning and one at lunchtime 03/12/19  Yes Stacks, Cletus Gash, MD  gabapentin (NEURONTIN) 100 MG capsule TAKE 1 CAPSULE (100 MG TOTAL) BY MOUTH THREE (3) TIMES A DAY. Patient taking differently: Take 100 mg by mouth 3 (three) times daily. TAKE 1 CAPSULE (100 MG TOTAL) BY MOUTH  THREE (3) TIMES A DAY. 03/12/19  Yes Claretta Fraise, MD  hydrALAZINE (APRESOLINE) 100 MG tablet Take 1 tablet (100 mg total) by mouth 4 (four) times daily -  before meals and at bedtime. 03/12/19  Yes Stacks, Cletus Gash, MD  Insulin Glargine (LANTUS SOLOSTAR) 100 UNIT/ML Solostar Pen Inject 12 Units into the skin daily. At 7 am 03/12/19  Yes Stacks, Cletus Gash, MD  isosorbide  dinitrate (ISORDIL) 20 MG tablet Take 2 tablets (40 mg total) by mouth 3 (three) times daily. 03/12/19  Yes Stacks, Cletus Gash, MD  levothyroxine (SYNTHROID) 100 MCG tablet Take 1 tablet (100 mcg total) by mouth daily. 04/30/19  Yes Claretta Fraise, MD  [facility] ULTRA test strip USE AS INSTRUCTED 06/06/19  Yes Claretta Fraise, MD  rosuvastatin (CRESTOR) 20 MG tablet Take 1 tablet (20 mg total) by mouth every evening. 03/12/19  Yes Claretta Fraise, MD  tamsulosin (FLOMAX) 0.4 MG CAPS capsule Take 2 capsules (0.8 mg total) by mouth daily. Patient taking differently: Take 0.4 mg by mouth in the morning and at bedtime.  03/12/19  Yes Claretta Fraise, MD   Allergies  Allergen Reactions  . Sulfa Antibiotics Hives   Review of Systems  Unable to perform ROS: Acuity of condition    Physical Exam Vitals and nursing note reviewed.  Constitutional:      General: He is not in acute distress.    Appearance: He is obese. He is ill-appearing.  HENT:     Head: Atraumatic.  Cardiovascular:     Rate and Rhythm: Normal rate.  Pulmonary:     Effort: Pulmonary effort is normal.  Abdominal:     Palpations: Abdomen is soft.  Skin:    General: Skin is warm and dry.  Neurological:     Mental Status: He is alert and oriented to person, place, and time.  Psychiatric:        Mood and Affect: Mood normal. Mood is not anxious.        Behavior: Behavior normal.     Vital Signs: BP (!) 184/59   Pulse 70   Temp (!) 97.5 F (36.4 C) (Oral)   Resp 18   Ht _0  (1.854 m)   Wt 106.1 kg   SpO2 100%   BMI 30.86 kg/m  Pain Scale: 0-10   Pain Score: 0-No pain   SpO2: SpO2: 100 % O2 Device:SpO2: 100 % O2 Flow Rate: .O2 Flow Rate (L/min): 4 L/min  IO: Intake/output summary:   Intake/Output Summary (Last 24 hours) at 06/11/2019 1417 Last data filed at 06/11/2019 1228 Gross per 24 hour  Intake 105.83 ml  Output -  Net 105.83 ml    LBM:   Baseline Weight: Weight: 106.1 kg Most recent weight: Weight: 106.1 kg      Palliative Assessment/Data:   Flowsheet Rows     Most Recent Value  Intake Tab  Referral Department  Hospitalist  Unit at Time of Referral  Intermediate Care Unit  Palliative Care Primary Diagnosis  Pulmonary  Date Notified  06/11/19  Palliative Care Type  New Palliative care  Date of Admission  06/11/19  Date first seen by Palliative Care  06/11/19  # of days Palliative referral response time  0 Day(s)  # of days IP prior to Palliative referral  0  Clinical Assessment  Palliative Performance Scale Score  20%  Pain Max last 24 hours  Not able to report  Pain Min Last 24 hours  Not able to report  Dyspnea Max Last 24  Hours  Not able to report  Dyspnea Min Last 24 hours  Not able to report  Psychosocial & Spiritual Assessment  Palliative Care Outcomes      Time In: 1430 Time Out: 1500  Time Total: 30 minutes   Greater than 50%  of this time was spent counseling and coordinating care related to the above assessment and plan.  Signed by: Drue Novel, NP   Please contact Palliative Medicine Team phone at [phone] for questions and concerns.  For individual provider: See Shea Evans

## 2019-06-11 NOTE — ED Provider Notes (Signed)
[facility] Provider Note   CSN: [id number] Arrival date & time: 06/11/19  7371     History Chief Complaint  Patient presents with  . Shortness of Breath    [name]. is a 74 y.o. male.  Patient with end-stage renal disease has been told he needs dialysis however he refuses, history of pulmonary edema, coronary artery disease, follows with local cardiology, diabetes presents with worsening shortness of breath since yesterday.  Patient feels more edematous unsure weight gain.  Patient reportedly is taking his medications.  Patient still produces urine.  Patient does not know his detailed medication list however review of meds does show 80 mg of Lasix.  Patient has significant shortness of breath since this morning worse with lying flat.  No significant cough no fever.  No Covid contacts or diagnosis recently.        Past Medical History:  Diagnosis Date  . Acute blood loss anemia   . Acute pulmonary edema (HCC)   . CAD (coronary artery disease), native coronary artery    s/p NSTEMI and CABG x 3 (LIMA to LAD, SVG to diagonal, SVG to dRCA) on 08/05/16  . Carotid artery occlusion   . Chronic kidney disease   . Diabetes mellitus without complication ([place])   . Hypertension   . NSTEMI (non-ST elevated myocardial infarction) ([place]) 07/25/2016  . S/P CABG (coronary artery bypass graft)     Patient Active Problem List   Diagnosis Date Noted  . Right inguinal hernia 11/12/2018  . Cardiac murmur 10/09/2017  . CKD (chronic kidney disease) stage 5, GFR less than 15 ml/min (HCC) 07/17/2017  . Hypertensive heart disease with chronic combined systolic and diastolic congestive heart failure ([place])   . Hx of CABG   . Diabetes mellitus type 2 in nonobese (HCC)   . Essential hypertension 07/25/2016  . 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 Hypothyroidism 07/25/2016  . Dyslipidemia 07/25/2016    Past Surgical History:  Procedure  Laterality Date  . APPENDECTOMY    . CORONARY ARTERY BYPASS GRAFT N/A 08/05/2016   Procedure: CORONARY ARTERY BYPASS GRAFTING (CABG) x 3, LIMA to LAD, SVG to DIAGONAL, SVG to OM1, SVG to DISTAL RCA, USING LEFT MAMMARY ARTERY AND RIGHT GREATER SAPHENOUS VEIN HARVESTED ENDOSCOPICALLY;  Surgeon: [name], MD;  Location: [facility];  Service: Open Heart Surgery;  Laterality: N/A;  . JOINT REPLACEMENT Bilateral    hip replacements  . LEFT HEART CATH AND CORONARY ANGIOGRAPHY N/A 07/28/2016   Procedure: Left Heart Cath and Coronary Angiography;  Surgeon: Jettie Booze, MD;  Location: [facility];  Service: Cardiovascular;  Laterality: N/A;  . LUMBAR FUSION  2005  . TEE WITHOUT CARDIOVERSION N/A 08/05/2016   Procedure: TRANSESOPHAGEAL ECHOCARDIOGRAM (TEE);  Surgeon: [name], MD;  Location: [facility];  Service: Open Heart Surgery;  Laterality: N/A;  . TONSILECTOMY, ADENOIDECTOMY, BILATERAL MYRINGOTOMY AND TUBES         Family History  Problem Relation Age of Onset  . Heart disease Mother   . Hypertension Mother   . Arthritis Mother   . Cancer Mother   . Diabetes Mother   . Hyperlipidemia Mother   . Kidney disease Mother   . Hypertension Father   . Arthritis Father   . Cancer Father   . Diabetes Father   . Hyperlipidemia Father   . Hyperlipidemia Sister   . Hypertension Sister   . Vision loss Sister   .  Hyperlipidemia Brother   . Hypertension Brother   . Arthritis Maternal Grandmother   . Arthritis Maternal Grandfather   . Arthritis Paternal Grandmother   . Arthritis Paternal Grandfather   . Asthma Sister   . Hyperlipidemia Sister   . Hypertension Sister   . Cancer Brother   . Diabetes Brother   . Hyperlipidemia Brother   . Hypertension Brother   . Cancer Brother   . Hyperlipidemia Brother   . Hypertension Brother   . Heart attack Son        70    Social History   Tobacco Use  . Smoking status: Former Smoker    Quit date: 1980    Years since  quitting: 41.3  . Smokeless tobacco: Never Used  Substance Use Topics  . Alcohol use: No  . Drug use: No    Home Medications Prior to Admission medications   Medication Sig Start Date End Date Taking? Authorizing Provider  acetaminophen (TYLENOL) 325 MG tablet Take 2 tablets (650 mg total) by mouth every 6 (six) hours as needed for mild pain. 08/16/16   Nani Skillern, PA-C  allopurinol (ZYLOPRIM) 100 MG tablet Take 1 tablet (100 mg total) by mouth daily. 03/12/19   Claretta Fraise, MD  AMITIZA 24 MCG capsule Take 1 capsule (24 mcg total) by mouth 2 (two) times daily. 04/30/19   Claretta Fraise, MD  amLODipine (NORVASC) 10 MG tablet Take 1 tablet (10 mg total) by mouth daily. 03/12/19   Claretta Fraise, MD  aspirin 81 MG EC tablet Take 81 mg by mouth daily. Swallow whole.    [provider]  betamethasone valerate (VALISONE) 0.1 % cream USE AS DIRECTED 01/04/19   Claretta Fraise, MD  carvedilol (COREG) 25 MG tablet TAKE 1 & 1/2 TABLET BY MOUTH TWICE A DAY 03/12/19   Claretta Fraise, MD  cloNIDine (CATAPRES) 0.1 MG tablet TAKE 2 TABLETS BY MOUTH 3 TIMES A DAY. 03/12/19   Claretta Fraise, MD  doxazosin (CARDURA) 4 MG tablet Take 1 tablet (4 mg total) by mouth 2 (two) times daily. 03/12/19   Claretta Fraise, MD  finasteride (PROSCAR) 5 MG tablet Take 1 tablet (5 mg total) by mouth daily. 03/12/19   Claretta Fraise, MD  fluticasone Asencion Islam) 50 MCG/ACT nasal spray USE 2 SPRAYS IN EACH NOSTRIL AT BEDTIME 03/12/19   Claretta Fraise, MD  furosemide (LASIX) 80 MG tablet Two on awakening in the morning and one at lunchtime 03/12/19   Claretta Fraise, MD  gabapentin (NEURONTIN) 100 MG capsule TAKE 1 CAPSULE (100 MG TOTAL) BY MOUTH THREE (3) TIMES A DAY. 03/12/19   Claretta Fraise, MD  hydrALAZINE (APRESOLINE) 100 MG tablet Take 1 tablet (100 mg total) by mouth 4 (four) times daily -  before meals and at bedtime. 03/12/19   Claretta Fraise, MD  Insulin Glargine (LANTUS SOLOSTAR) 100 UNIT/ML Solostar Pen Inject  12 Units into the skin daily. At 7 am 03/12/19   Claretta Fraise, MD  isosorbide dinitrate (ISORDIL) 20 MG tablet Take 2 tablets (40 mg total) by mouth 3 (three) times daily. 03/12/19   Claretta Fraise, MD  levothyroxine (SYNTHROID) 100 MCG tablet Take 1 tablet (100 mcg total) by mouth daily. 04/30/19   Claretta Fraise, MD  [facility] ULTRA test strip USE AS INSTRUCTED 06/06/19   Claretta Fraise, MD  rosuvastatin (CRESTOR) 20 MG tablet Take 1 tablet (20 mg total) by mouth every evening. 03/12/19   Claretta Fraise, MD  tamsulosin (FLOMAX) 0.4 MG CAPS capsule Take 2 capsules (  0.8 mg total) by mouth daily. 03/12/19   Claretta Fraise, MD    Allergies    Sulfa antibiotics  Review of Systems   Review of Systems  Constitutional: Negative for chills and fever.  HENT: Negative for congestion.   Eyes: Negative for visual disturbance.  Respiratory: Positive for shortness of breath.   Cardiovascular: Positive for leg swelling. Negative for chest pain.  Gastrointestinal: Negative for abdominal pain and vomiting.  Genitourinary: Negative for dysuria and flank pain.  Musculoskeletal: Negative for back pain, neck pain and neck stiffness.  Skin: Negative for rash.  Neurological: Negative for light-headedness and headaches.    Physical Exam Updated Vital Signs BP (!) 196/71   Pulse 64   Temp (!) 97.5 F (36.4 C) (Oral)   Resp 17   Ht 6\' 1"  (1.854 m)   Wt 106.1 kg   SpO2 100%   BMI 30.86 kg/m   Physical Exam Vitals and nursing note reviewed.  Constitutional:      Appearance: He is well-developed.  HENT:     Head: Normocephalic and atraumatic.  Eyes:     General:        Right eye: No discharge.        Left eye: No discharge.     Conjunctiva/sclera: Conjunctivae normal.  Neck:     Trachea: No tracheal deviation.  Cardiovascular:     Rate and Rhythm: Normal rate and regular rhythm.  Pulmonary:     Effort: Tachypnea present.     Breath sounds: Examination of the right-middle field reveals rhonchi.  Examination of the left-middle field reveals rhonchi. Examination of the right-lower field reveals rhonchi. Examination of the left-lower field reveals rhonchi. Rhonchi present.  Abdominal:     General: There is no distension.     Palpations: Abdomen is soft.     Tenderness: There is no abdominal tenderness. There is no guarding.  Musculoskeletal:     Cervical back: Normal range of motion and neck supple.     Right lower leg: Edema present.     Left lower leg: Edema present.  Skin:    General: Skin is warm.     Findings: No rash.  Neurological:     Mental Status: He is alert and oriented to person, place, and time.  Psychiatric:        Mood and Affect: Mood normal.     ED Results / Procedures / Treatments   Labs (all labs ordered are listed, but only abnormal results are displayed) Labs Reviewed  COMPREHENSIVE METABOLIC PANEL - Abnormal; Notable for the following components:      Result Value   CO2 16 (*)    Glucose, Bld 105 (*)    BUN 112 (*)    Creatinine, Ser 4.91 (*)    Calcium 8.5 (*)    Total Protein 8.3 (*)    GFR calc non Af Amer 11 (*)    GFR calc Af Amer 12 (*)    All other components within normal limits  CBC WITH DIFFERENTIAL/PLATELET - Abnormal; Notable for the following components:   RBC 2.55 (*)    Hemoglobin 8.1 (*)    HCT 25.9 (*)    MCV 101.6 (*)    RDW 17.5 (*)    Platelets 90 (*)    nRBC 0.5 (*)    Lymphs Abs 0.4 (*)    All other components within normal limits  BRAIN NATRIURETIC PEPTIDE - Abnormal; Notable for the following components:   B Natriuretic Peptide 963.0 (*)  All other components within normal limits  TROPONIN I (HIGH SENSITIVITY) - Abnormal; Notable for the following components:   Troponin I (High Sensitivity) 27 (*)    All other components within normal limits  RESPIRATORY PANEL BY RT PCR (FLU A&B, COVID)    EKG EKG Interpretation  Date/Time:  Tuesday June 11 2019 06:30:18 EDT Ventricular Rate:  68 PR Interval:    QRS  Duration: 115 QT Interval:  436 QTC Calculation: 464 R Axis:   94 Text Interpretation: Sinus rhythm Prolonged PR interval Nonspecific intraventricular conduction delay Anteroseptal infarct, old Abnormal T, consider ischemia, diffuse leads No significant change since last tracing 02 Dec 2016 Confirmed by Rolland Porter [phone]) on 06/11/2019 6:49:51 AM   Radiology DG Chest Port 1 View  Result Date: 06/11/2019 CLINICAL DATA:  Shortness of breath EXAM: PORTABLE CHEST 1 VIEW COMPARISON:  12/02/2016 FINDINGS: Cardiomegaly. 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 Diffuse interstitial opacity with Kerley lines. Perihilar airspace disease that is likely alveolar edema. No visible effusion or pneumothorax. IMPRESSION: CHF. Electronically Signed   By: Monte Fantasia M.D.   On: 06/11/2019 07:20    Procedures .Critical Care Performed by: Elnora Morrison, MD Authorized by: Elnora Morrison, MD   Critical care provider statement:    Critical care time (minutes):  75   Critical care start time:  06/11/2019 7:35 AM   Critical care end time:  06/11/2019 8:40 AM   Critical care time was exclusive of:  Separately billable procedures and treating other patients   Critical care was necessary to treat or prevent imminent or life-threatening deterioration of the following conditions:  Cardiac failure   Critical care was time spent personally by me on the following activities:  Evaluation of patient's response to treatment, examination of patient, ordering and performing treatments and interventions, ordering and review of laboratory studies, ordering and review of radiographic studies, pulse oximetry, re-evaluation of patient's condition, obtaining history from patient or surrogate and review of old charts   (including critical care time)  Medications Ordered in ED Medications  nitroGLYCERIN 50 mg in dextrose 5 % 250 mL (0.2 mg/mL) infusion (30 mcg/min Intravenous Rate/Dose Verify 06/11/19 0848)  furosemide (LASIX) injection 80 mg (has  no administration in time range)  albuterol (VENTOLIN HFA) 108 (90 Base) MCG/ACT inhaler 8 puff (8 puffs Inhalation Given 06/11/19 0652)  furosemide (LASIX) injection 80 mg (80 mg Intravenous Given 06/11/19 0652)  nitroGLYCERIN (NITROGLYN) 2 % ointment 1 inch (1 inch Topical Given 06/11/19 6045)    ED Course  I have reviewed the triage vital signs and the nursing notes.  Pertinent labs & imaging results that were available during my care of the patient were reviewed by me and considered in my medical decision making (see chart for details).    MDM Rules/Calculators/A&P                     Patient with significant cardiac and renal history presents with acute worsening shortness of breath this morning.  Clinically patient is tachypneic, rales diffuse and audible from the bedside, sitting up with few word sentences.  Reviewed recent medical records including cardiology note and nephrology note with significant cardiac history and worsening renal disease.  Clarified with patient and he does not want dialysis even if it is to save his life.  Patient does not want CPR.  Patient said he would accept BiPAP mask and intubation if needed.  Patient on 4 L nasal cannula initially however with persistent increased work of breathing,  elevated blood pressure and concern for pulmonary edema BiPAP ordered.  For completeness Covid test ordered.  Patient will need admission for further treatment and management.  Nitro drip ordered.  Discussed with lab requiring blood work soon as possible to check potassium kidney function.  EKG reviewed T wave changes similar to previous.  Patient improved on reassessment.  Plan for trial BiPAP after Covid test negative.  Blood work reviewed kidney function worsening 4.9 creatinine, bicarb 15, hemoglobin 8.  Potassium normal.  BNP 963 consistent with his pulmonary edema.  Plan to titrate nitro drip, Lasix IV 80 ordered.  Paged hospitalist for admission.  [name] was  evaluated in Emergency Department on 06/11/2019 for the symptoms described in the history of present illness. He was evaluated in the context of the global COVID-19 pandemic, which necessitated consideration that the patient might be at risk for infection with the SARS-CoV-2 virus that causes COVID-19. Institutional protocols and algorithms that pertain to the evaluation of patients at risk for COVID-19 are in a state of rapid change based on information released by regulatory bodies including the CDC and federal and state organizations. These policies and algorithms were followed during the patient's care in the ED.  Final Clinical Impression(s) / ED Diagnoses Final diagnoses:  Acute pulmonary edema ([place])  Hypoxia  Acute renal failure, unspecified acute renal failure type [facility])    Rx / DC Orders ED Discharge Orders    None       Elnora Morrison, MD 06/11/19 [phone]

## 2019-06-11 NOTE — Consult Note (Addendum)
[name] Nephrology Consultation Note  Requesting MD: Dr Orson Eva Reason for consult: CKD5  HPI:  [name]. is a 74 y.o. male with history of hypertension, diabetes, CAD status post CABG, CKD stage V followed by Dr. Lowanda Foster, admitted for acute pulmonary edema, seen as a consultation at the request of Dr. Carles Collet for CKD 5 and fluid overload.  Patient has longstanding CKD with recent baseline creatinine seems to be around 3.5-4.  Patient said he was seen by Dr. Lowanda Foster around 2 weeks ago.  He is very clear about not wanting dialysis.  Apparently his brother was on dialysis and then passed away many years ago.  He was brought in by EMS to the ER for worsening shortness of breath.  He was initially refusing to come to the ER however family insisted on coming to the hospital.  In the ER, the blood pressure was 201/75, required about 4 L of oxygen to maintain saturation.  The chest x-ray consistent with pulmonary edema/CHF.  The labs showed creatinine level 4.91, BUN 112, CO2 16, potassium 4.8, BNP 963, hemoglobin 8.1.  The patient informed the admitting physician, Dr. Carles Collet that he would not want dialysis.  We are consulted to confirm patient's decision of not wanting dialysis.  His wife at bedside.  I have discussed about his worsening kidney function and importance of initiating dialysis in order to manage volume.  Patient said he has had discussion about this many times in the past.  He does not want dialysis and he agreed with palliative care consult.  He denies chest pain however has shortness of breath and weakness.  Denies headache, dizziness, nausea, vomiting, tremor.  PMHx:   Past Medical History:  Diagnosis Date  . Acute blood loss anemia   . Acute pulmonary edema (HCC)   . CAD (coronary artery disease), native coronary artery    s/p NSTEMI and CABG x 3 (LIMA to LAD, SVG to diagonal, SVG to dRCA) on 08/05/16  . Carotid artery occlusion   . Chronic kidney  disease   . Diabetes mellitus without complication ([place])   . Hypertension   . NSTEMI (non-ST elevated myocardial infarction) ([place]) 07/25/2016  . S/P CABG (coronary artery bypass graft)     Past Surgical History:  Procedure Laterality Date  . APPENDECTOMY    . CORONARY ARTERY BYPASS GRAFT N/A 08/05/2016   Procedure: CORONARY ARTERY BYPASS GRAFTING (CABG) x 3, LIMA to LAD, SVG to DIAGONAL, SVG to OM1, SVG to DISTAL RCA, USING LEFT MAMMARY ARTERY AND RIGHT GREATER SAPHENOUS VEIN HARVESTED ENDOSCOPICALLY;  Surgeon: [name], MD;  Location: [facility];  Service: Open Heart Surgery;  Laterality: N/A;  . JOINT REPLACEMENT Bilateral    hip replacements  . LEFT HEART CATH AND CORONARY ANGIOGRAPHY N/A 07/28/2016   Procedure: Left Heart Cath and Coronary Angiography;  Surgeon: Jettie Booze, MD;  Location: [facility];  Service: Cardiovascular;  Laterality: N/A;  . LUMBAR FUSION  2005  . TEE WITHOUT CARDIOVERSION N/A 08/05/2016   Procedure: TRANSESOPHAGEAL ECHOCARDIOGRAM (TEE);  Surgeon: [name], MD;  Location: [facility];  Service: Open Heart Surgery;  Laterality: N/A;  . TONSILECTOMY, ADENOIDECTOMY, BILATERAL MYRINGOTOMY AND TUBES      Family Hx:  Family History  Problem Relation Age of Onset  . Heart disease Mother   . Hypertension Mother   . Arthritis Mother   . Cancer Mother   . Diabetes Mother   . Hyperlipidemia Mother   .  Kidney disease Mother   . Hypertension Father   . Arthritis Father   . Cancer Father   . Diabetes Father   . Hyperlipidemia Father   . Hyperlipidemia Sister   . Hypertension Sister   . Vision loss Sister   . Hyperlipidemia Brother   . Hypertension Brother   . Arthritis Maternal Grandmother   . Arthritis Maternal Grandfather   . Arthritis Paternal Grandmother   . Arthritis Paternal Grandfather   . Asthma Sister   . Hyperlipidemia Sister   . Hypertension Sister   . Cancer Brother   . Diabetes Brother   . Hyperlipidemia Brother   .  Hypertension Brother   . Cancer Brother   . Hyperlipidemia Brother   . Hypertension Brother   . Heart attack Son        42    Social History:  reports that he quit smoking about 41 years ago. He has never used smokeless tobacco. He reports that he does not drink alcohol or use drugs.  Allergies:  Allergies  Allergen Reactions  . Sulfa Antibiotics Hives    Medications: Prior to Admission medications   Medication Sig Start Date End Date Taking? Authorizing Provider  acetaminophen (TYLENOL) 325 MG tablet Take 2 tablets (650 mg total) by mouth every 6 (six) hours as needed for mild pain. 08/16/16   Nani Skillern, PA-C  allopurinol (ZYLOPRIM) 100 MG tablet Take 1 tablet (100 mg total) by mouth daily. 03/12/19   Claretta Fraise, MD  AMITIZA 24 MCG capsule Take 1 capsule (24 mcg total) by mouth 2 (two) times daily. 04/30/19   Claretta Fraise, MD  amLODipine (NORVASC) 10 MG tablet Take 1 tablet (10 mg total) by mouth daily. 03/12/19   Claretta Fraise, MD  aspirin 81 MG EC tablet Take 81 mg by mouth daily. Swallow whole.    [provider]  betamethasone valerate (VALISONE) 0.1 % cream USE AS DIRECTED 01/04/19   Claretta Fraise, MD  carvedilol (COREG) 25 MG tablet TAKE 1 & 1/2 TABLET BY MOUTH TWICE A DAY 03/12/19   Claretta Fraise, MD  cloNIDine (CATAPRES) 0.1 MG tablet TAKE 2 TABLETS BY MOUTH 3 TIMES A DAY. 03/12/19   Claretta Fraise, MD  doxazosin (CARDURA) 4 MG tablet Take 1 tablet (4 mg total) by mouth 2 (two) times daily. 03/12/19   Claretta Fraise, MD  finasteride (PROSCAR) 5 MG tablet Take 1 tablet (5 mg total) by mouth daily. 03/12/19   Claretta Fraise, MD  fluticasone Asencion Islam) 50 MCG/ACT nasal spray USE 2 SPRAYS IN EACH NOSTRIL AT BEDTIME 03/12/19   Claretta Fraise, MD  furosemide (LASIX) 80 MG tablet Two on awakening in the morning and one at lunchtime 03/12/19   Claretta Fraise, MD  gabapentin (NEURONTIN) 100 MG capsule TAKE 1 CAPSULE (100 MG TOTAL) BY MOUTH THREE (3) TIMES A DAY.  03/12/19   Claretta Fraise, MD  hydrALAZINE (APRESOLINE) 100 MG tablet Take 1 tablet (100 mg total) by mouth 4 (four) times daily -  before meals and at bedtime. 03/12/19   Claretta Fraise, MD  Insulin Glargine (LANTUS SOLOSTAR) 100 UNIT/ML Solostar Pen Inject 12 Units into the skin daily. At 7 am 03/12/19   Claretta Fraise, MD  isosorbide dinitrate (ISORDIL) 20 MG tablet Take 2 tablets (40 mg total) by mouth 3 (three) times daily. 03/12/19   Claretta Fraise, MD  levothyroxine (SYNTHROID) 100 MCG tablet Take 1 tablet (100 mcg total) by mouth daily. 04/30/19   Claretta Fraise, MD  [facility] ULTRA test  strip USE AS INSTRUCTED 06/06/19   Claretta Fraise, MD  rosuvastatin (CRESTOR) 20 MG tablet Take 1 tablet (20 mg total) by mouth every evening. 03/12/19   Claretta Fraise, MD  tamsulosin (FLOMAX) 0.4 MG CAPS capsule Take 2 capsules (0.8 mg total) by mouth daily. 03/12/19   Claretta Fraise, MD    I have reviewed the patient's current medications.  Labs:  Results for orders placed or performed during the hospital encounter of 06/11/19 (from the past 48 hour(s))  Comprehensive metabolic panel     Status: Abnormal   Collection Time: 06/11/19  7:44 AM  Result Value Ref Range   Sodium 136 135 - 145 mmol/L   Potassium 4.8 3.5 - 5.1 mmol/L   Chloride 107 98 - 111 mmol/L   CO2 16 (L) 22 - 32 mmol/L   Glucose, Bld 105 (H) 70 - 99 mg/dL    Comment: Glucose reference range applies only to samples taken after fasting for at least 8 hours.   BUN 112 (H) 8 - 23 mg/dL    Comment: RESULTS CONFIRMED BY MANUAL DILUTION   Creatinine, Ser 4.91 (H) 0.61 - 1.24 mg/dL   Calcium 8.5 (L) 8.9 - 10.3 mg/dL   Total Protein 8.3 (H) 6.5 - 8.1 g/dL   Albumin 3.5 3.5 - 5.0 g/dL   AST 16 15 - 41 U/L   ALT 13 0 - 44 U/L   Alkaline Phosphatase 64 38 - 126 U/L   Total Bilirubin 0.8 0.3 - 1.2 mg/dL   GFR calc non Af Amer 11 (L) >60 mL/min   GFR calc Af Amer 12 (L) >60 mL/min   Anion gap 13 5 - 15    Comment: Performed at [facility], [address] [place], [place] [address]  CBC with Differential     Status: Abnormal   Collection Time: 06/11/19  7:44 AM  Result Value Ref Range   WBC 6.2 4.0 - 10.5 K/uL   RBC 2.55 (L) 4.22 - 5.81 MIL/uL   Hemoglobin 8.1 (L) 13.0 - 17.0 g/dL   HCT 25.9 (L) 39.0 - 52.0 %   MCV 101.6 (H) 80.0 - 100.0 fL   MCH 31.8 26.0 - 34.0 pg   MCHC 31.3 30.0 - 36.0 g/dL   RDW 17.5 (H) 11.5 - 15.5 %   Platelets 90 (L) 150 - 400 K/uL    Comment: Immature Platelet Fraction may be clinically indicated, consider ordering this additional test [id number]    nRBC 0.5 (H) 0.0 - 0.2 %   Neutrophils Relative % 83 %   Neutro Abs 5.2 1.7 - 7.7 K/uL   Lymphocytes Relative 6 %   Lymphs Abs 0.4 (L) 0.7 - 4.0 K/uL   Monocytes Relative 4 %   Monocytes Absolute 0.3 0.1 - 1.0 K/uL   Eosinophils Relative 5 %   Eosinophils Absolute 0.3 0.0 - 0.5 K/uL   Basophils Relative 1 %   Basophils Absolute 0.0 0.0 - 0.1 K/uL   Immature Granulocytes 1 %   Abs Immature Granulocytes 0.03 0.00 - 0.07 K/uL    Comment: Performed at [facility], [address], [place], [place] [address]  Brain natriuretic peptide     Status: Abnormal   Collection Time: 06/11/19  7:44 AM  Result Value Ref Range   B Natriuretic Peptide 963.0 (H) 0.0 - 100.0 pg/mL    Comment: Performed at [facility], [address] [place], [place] [address]  Troponin I (High Sensitivity)     Status: Abnormal  Collection Time: 06/11/19  7:44 AM  Result Value Ref Range   Troponin I (High Sensitivity) 27 (H) <18 ng/L    Comment: (NOTE) Elevated high sensitivity troponin I (hsTnI) values and significant  changes across serial measurements may suggest ACS but many other  chronic and acute conditions are known to elevate hsTnI results.  Refer to the "Links" section for chest pain algorithms and additional  guidance. Performed at [facility], [address], [place], [place] [address]   Respiratory Panel by RT PCR (Flu A&B, Covid) - Nasopharyngeal Swab      Status: None   Collection Time: 06/11/19  8:15 AM   Specimen: Nasopharyngeal Swab  Result Value Ref Range   SARS Coronavirus 2 by RT PCR NEGATIVE NEGATIVE    Comment: (NOTE) SARS-CoV-2 target nucleic acids are NOT DETECTED. The SARS-CoV-2 RNA is generally detectable in upper respiratoy specimens during the acute phase of infection. The lowest concentration of SARS-CoV-2 viral copies this assay can detect is 131 copies/mL. A negative result does not preclude SARS-Cov-2 infection and should not be used as the sole basis for treatment or other patient management decisions. A negative result may occur with  improper specimen collection/handling, submission of specimen other than nasopharyngeal swab, presence of viral mutation(s) within the areas targeted by this assay, and inadequate number of viral copies (<131 copies/mL). A negative result must be combined with clinical observations, patient history, and epidemiological information. The expected result is Negative. Fact Sheet for Patients:  PinkCheek.be Fact Sheet for Healthcare Providers:  GravelBags.it This test is not yet ap proved or cleared by the [place] FDA and  has been authorized for detection and/or diagnosis of SARS-CoV-2 by FDA under an Emergency Use Authorization (EUA). This EUA will remain  in effect (meaning this test can be used) for the duration of the COVID-19 declaration under Section 564(b)(1) of the Act, 21 U.S.C. section 360bbb-3(b)(1), unless the authorization is terminated or revoked sooner.    Influenza A by PCR NEGATIVE NEGATIVE   Influenza B by PCR NEGATIVE NEGATIVE    Comment: (NOTE) The Xpert Xpress SARS-CoV-2/FLU/RSV assay is intended as an aid in  the diagnosis of influenza from Nasopharyngeal swab specimens and  should not be used as a sole basis for treatment. Nasal washings and  aspirates are unacceptable for Xpert Xpress  SARS-CoV-2/FLU/RSV  testing. Fact Sheet for Patients: PinkCheek.be Fact Sheet for Healthcare Providers: GravelBags.it This test is not yet approved or cleared by the [place] FDA and  has been authorized for detection and/or diagnosis of SARS-CoV-2 by  FDA under an Emergency Use Authorization (EUA). This EUA will remain  in effect (meaning this test can be used) for the duration of the  Covid-19 declaration under Section 564(b)(1) of the Act, 21  U.S.C. section 360bbb-3(b)(1), unless the authorization is  terminated or revoked. Performed at [facility], [address] [place], [place] [address]      ROS:  Pertinent items noted in HPI and remainder of comprehensive ROS otherwise negative.  Physical Exam: Vitals:   06/11/19 0845 06/11/19 0930  BP: (!) 196/71 (!) 196/71  Pulse: 64 69  Resp: 17 (!) 21  Temp:    SpO2: 100% 99%     General exam: Sitting on bed, using around 4 L of oxygen, not in apparent distress. Respiratory system: Bibasal coarse breath sound, respiratory effort normal Cardiovascular system: S1 & S2 heard, RRR, no rubs.  Bilateral lower extremity pitting edema++ Gastrointestinal system: Abdomen is nondistended, soft and  nontender. Normal bowel sounds heard. Central nervous system: Alert and oriented. No focal neurological deficits. Extremities: No cyanosis or clubbing however has chronic stasis changes and edema. Skin: No rashes, lesions or ulcers Psychiatry: Judgement and insight appear normal. Mood & affect appropriate.   Assessment/Plan:  #CKD stage V with acute pulmonary edema/metabolic acidosis: Dialysis is recommended at this point however patient is clear about not wanting dialysis.  I will start IV Lasix 100 mg every 8 hourly to manage volume.  Hopefully he will respond with IV diuresis. Recommend palliative care consult to discuss goals of care. Monitor BMP, strict ins and out.  #Acute  pulmonary edema/fluid overload: Continue IV Lasix as above.  #Metabolic acidosis: Start sodium bicarbonate.  #Hypertensive urgency: Resume home medication.  Diuretics as above.  Monitor BP.  #Anemia of CKD: Check iron studies.  Monitor CBC.  #CKD-MBD: Check phosphorus level.  #Disposition: As he is refusing dialysis.  Palliative care consult is appropriate at this time.  Uriah Trueba Tanna Furry 06/11/2019, 9:53 AM  [employer].

## 2019-06-11 NOTE — ED Provider Notes (Signed)
Patient has end-stage renal disease and has been refusing dialysis.  He reports he got acutely short of breath this morning.  He denies chest pain but states he has been having swelling and weakness and muscle cramps.  He allowed EMS to give him 1 albuterol nebulizer treatment but refused anything else.  Patient is in obvious respiratory distress.  He has audible rales.  He has puffiness of his face and eyes.  On lung exam he has diffuse rales especially at the bases.  His abdomen appears to be distended but there is no pitting edema noted.  His lower extremities appear to have some swelling, the right is worse than the left and he has some chronic changes of his lower extremities.  He has some hyperpigmentation, and also some erythema.   EKG Interpretation  Date/Time:  Tuesday June 11 2019 06:30:18 EDT Ventricular Rate:  68 PR Interval:    QRS Duration: 115 QT Interval:  436 QTC Calculation: 464 R Axis:   94 Text Interpretation: Sinus rhythm Prolonged PR interval Nonspecific intraventricular conduction delay Anteroseptal infarct, old Abnormal T, consider ischemia, diffuse leads No significant change since last tracing 02 Dec 2016 Confirmed by Rolland Porter [phone]) on 06/11/2019 6:49:51 AM      Review of his prior labs shows his last creatinine 3 months ago was 4.35 with a GFR of 14.  Medications  albuterol (VENTOLIN HFA) 108 (90 Base) MCG/ACT inhaler 8 puff (has no administration in time range)  furosemide (LASIX) injection 80 mg (has no administration in time range)  nitroGLYCERIN (NITROGLYN) 2 % ointment 1 inch (has no administration in time range)   Patient turned over to day shift at change of shift.   Rolland Porter, MD 06/11/19 [phone]

## 2019-06-11 NOTE — ED Triage Notes (Addendum)
Pt arrives via ems from home for c/o sob.  Originally refused to come to hospital  History of COPD, kidney failure but refuses dialysis.   Received albuterol neb with ems but refused any other treatment   22g r hand.   Family made him come to hospital he did not want to come because he has been here so many times

## 2019-06-12 ENCOUNTER — Inpatient Hospital Stay (HOSPITAL_COMMUNITY): Payer: Medicare Other

## 2019-06-12 DIAGNOSIS — N178 Other acute kidney failure: Secondary | ICD-10-CM

## 2019-06-12 DIAGNOSIS — J810 Acute pulmonary edema: Principal | ICD-10-CM

## 2019-06-12 DIAGNOSIS — J9601 Acute respiratory failure with hypoxia: Secondary | ICD-10-CM

## 2019-06-12 DIAGNOSIS — I5031 Acute diastolic (congestive) heart failure: Secondary | ICD-10-CM

## 2019-06-12 DIAGNOSIS — I10 Essential (primary) hypertension: Secondary | ICD-10-CM

## 2019-06-12 LAB — RENAL FUNCTION PANEL
Albumin: 3.2 g/dL — ABNORMAL LOW (ref 3.5–5.0)
Anion gap: 14 (ref 5–15)
BUN: 118 mg/dL — ABNORMAL HIGH (ref 8–23)
CO2: 15 mmol/L — ABNORMAL LOW (ref 22–32)
Calcium: 8.8 mg/dL — ABNORMAL LOW (ref 8.9–10.3)
Chloride: 106 mmol/L (ref 98–111)
Creatinine, Ser: 4.84 mg/dL — ABNORMAL HIGH (ref 0.61–1.24)
GFR calc Af Amer: 13 mL/min — ABNORMAL LOW (ref >60)
GFR calc non Af Amer: 11 mL/min — ABNORMAL LOW (ref >60)
Glucose, Bld: 227 mg/dL — ABNORMAL HIGH (ref 70–99)
Phosphorus: 6.5 mg/dL — ABNORMAL HIGH (ref 2.5–4.6)
Potassium: 4 mmol/L (ref 3.5–5.1)
Sodium: 135 mmol/L (ref 135–145)

## 2019-06-12 LAB — GLUCOSE, CAPILLARY
Glucose-Capillary: 233 mg/dL — ABNORMAL HIGH (ref 70–99)
Glucose-Capillary: 186 mg/dL — ABNORMAL HIGH (ref 70–99)
Glucose-Capillary: 234 mg/dL — ABNORMAL HIGH (ref 70–99)
Glucose-Capillary: 167 mg/dL — ABNORMAL HIGH (ref 70–99)

## 2019-06-12 LAB — ECHOCARDIOGRAM COMPLETE
Height: 73 in
Weight: 3742.53 oz

## 2019-06-12 LAB — CBC
HCT: 23.3 % — ABNORMAL LOW (ref 39.0–52.0)
Hemoglobin: 7.2 g/dL — ABNORMAL LOW (ref 13.0–17.0)
MCH: 30.9 pg (ref 26.0–34.0)
MCHC: 30.9 g/dL (ref 30.0–36.0)
MCV: 100 fL (ref 80.0–100.0)
Platelets: 83 10*3/uL — ABNORMAL LOW (ref 150–400)
RBC: 2.33 MIL/uL — ABNORMAL LOW (ref 4.22–5.81)
RDW: 17.4 % — ABNORMAL HIGH (ref 11.5–15.5)
WBC: 4.7 10*3/uL (ref 4.0–10.5)
nRBC: 0 % (ref 0.0–0.2)

## 2019-06-12 LAB — IRON AND TIBC
Iron: 21 ug/dL — ABNORMAL LOW (ref 45–182)
Saturation Ratios: 11 % — ABNORMAL LOW (ref 17.9–39.5)
TIBC: 194 ug/dL — ABNORMAL LOW (ref 250–450)
UIBC: 173 ug/dL

## 2019-06-12 LAB — FERRITIN: Ferritin: 235 ng/mL (ref 24–336)

## 2019-06-12 MED ORDER — METOLAZONE 5 MG PO TABS
5.0000 mg | ORAL_TABLET | Freq: Once | ORAL | Status: AC
  Administered 2019-06-12: 5 mg via ORAL
  Filled 2019-06-12: qty 1

## 2019-06-12 MED ORDER — FUROSEMIDE 10 MG/ML IJ SOLN
120.0000 mg | Freq: Three times a day (TID) | INTRAVENOUS | Status: DC
  Administered 2019-06-12 – 2019-06-14 (×7): 120 mg via INTRAVENOUS
  Filled 2019-06-12 (×16): qty 12

## 2019-06-12 MED ORDER — DARBEPOETIN ALFA 100 MCG/0.5ML IJ SOSY
100.0000 ug | PREFILLED_SYRINGE | Freq: Once | INTRAMUSCULAR | Status: AC
  Administered 2019-06-12: 17:00:00 100 ug via SUBCUTANEOUS
  Filled 2019-06-12: qty 0.5

## 2019-06-12 MED ORDER — CALCIUM ACETATE (PHOS BINDER) 667 MG PO CAPS
667.0000 mg | ORAL_CAPSULE | Freq: Three times a day (TID) | ORAL | Status: DC
  Administered 2019-06-12 – 2019-06-14 (×7): 667 mg via ORAL
  Filled 2019-06-12 (×7): qty 1

## 2019-06-12 MED ORDER — SODIUM BICARBONATE 650 MG PO TABS
1300.0000 mg | ORAL_TABLET | Freq: Three times a day (TID) | ORAL | Status: DC
  Administered 2019-06-12 – 2019-06-14 (×6): 1300 mg via ORAL
  Filled 2019-06-12 (×6): qty 2

## 2019-06-12 MED ORDER — SODIUM CHLORIDE 0.9 % IV SOLN
250.0000 mg | Freq: Every day | INTRAVENOUS | Status: AC
  Administered 2019-06-12 – 2019-06-14 (×3): 250 mg via INTRAVENOUS
  Filled 2019-06-12: qty 20
  Filled 2019-06-12: qty 15
  Filled 2019-06-12: qty 20

## 2019-06-12 NOTE — Progress Notes (Addendum)
[facility] NEPHROLOGY PROGRESS NOTE  Assessment/ Plan: Pt is a 74 y.o. yo male  with history of hypertension, diabetes, CAD status post CABG, CKD stage V followed by Dr. Lowanda Foster, admitted for acute pulmonary edema, seen for CKD 5 and fluid overload  #CKD stage V with acute pulmonary edema/metabolic acidosis: Patient is very clear about not wanting dialysis.  Seen by palliative care and he is now DNR.  At this time, the plan to manage CKD conservatively. Minimal response with IV diuretics, around 1.1 L of urine output recorded. Change IV Lasix to 120 mg every 8 hour and add a dose of metolazone 5 mg to augment diuresis.   #Acute pulmonary edema/fluid overload: Continue IV Lasix as above.  #Metabolic acidosis: Increase sodium bicarbonate to 3 times daily.  #Hypertensive urgency: Currently on amlodipine, carvedilol, clonidine, Cardura, hydralazine, Isordil and labetalol as needed.  Plan to wean nitroglycerin drip gradually.  Discussed with ICU nurse.  #Anemia of CKD: Iron saturation 11%.  I will order IV iron and ESA.  Monitor CBC.  #CKD-MBD: Phosphorus level elevated, start PhosLo.  #Disposition: As he is refusing dialysis.  Seen by palliative care.  Recommend hospice care.  Subjective: Seen and examined in ICU.  Eating breakfast.  Reports shortness of breath is better.  Denies nausea vomiting chest pain shortness of breath.  Urine output is only 1100 cc on IV diuretics.  Still on nitroglycerin drip.  No other new event.  Continue to decline dialysis. Objective Vital signs in last 24 hours: Vitals:   06/12/19 0728 06/12/19 0800 06/12/19 0817 06/12/19 0818  BP:   (!) 196/67   Pulse: 79 81 84 83  Resp: (!) 23 (!) 21 20 (!) 26  Temp: 98.1 F (36.7 C)     TempSrc: Oral     SpO2: 94% 93% 93% 96%  Weight:      Height:       Weight change: 0 kg  Intake/Output Summary (Last 24 hours) at 06/12/2019 0854 Last data filed at 06/12/2019 0740 Gross per 24 hour  Intake  1772.35 ml  Output 1100 ml  Net 672.35 ml       Labs: Basic Metabolic Panel: Recent Labs  Lab 06/11/19 0744 06/12/19 0408  NA 136 135  K 4.8 4.0  CL 107 106  CO2 16* 15*  GLUCOSE 105* 227*  BUN 112* 118*  CREATININE 4.91* 4.84*  CALCIUM 8.5* 8.8*  PHOS  --  6.5*   Liver Function Tests: Recent Labs  Lab 06/11/19 0744 06/12/19 0408  AST 16  --   ALT 13  --   ALKPHOS 64  --   BILITOT 0.8  --   PROT 8.3*  --   ALBUMIN 3.5 3.2*   No results for input(s): LIPASE, AMYLASE in the last 168 hours. No results for input(s): AMMONIA in the last 168 hours. CBC: Recent Labs  Lab 06/11/19 0744 06/12/19 0408  WBC 6.2 4.7  NEUTROABS 5.2  --   HGB 8.1* 7.2*  HCT 25.9* 23.3*  MCV 101.6* 100.0  PLT 90* 83*   Cardiac Enzymes: No results for input(s): CKTOTAL, CKMB, CKMBINDEX, TROPONINI in the last 168 hours. CBG: Recent Labs  Lab 06/11/19 1641 06/11/19 2034 06/12/19 0727  GLUCAP 155* 169* 233*    Iron Studies:  Recent Labs    06/12/19 0408  IRON 21*  TIBC 194*  FERRITIN 235   Studies/Results: US RENAL  Result Date: 06/11/2019 CLINICAL DATA:  Acute exacerbation of renal failure. There is  underlying chronic renal failure. EXAM: RENAL / URINARY TRACT ULTRASOUND COMPLETE COMPARISON:  August 09, 2016 FINDINGS: Right Kidney: Renal measurements: 12.4 x 4.5 x 5.0 cm = volume: 140 mL . Echogenicity mildly increased. Renal cortical thickness low normal. No perinephric fluid or hydronephrosis visualized. There is a cyst arising from the upper pole of the right kidney measuring 2.1 x 1.6 x 1.8 cm. A cyst in the lower pole region on the right measures 1.6 x 1.5 x 1.6 cm. No sonographically demonstrable calculus or ureterectasis. Left Kidney: Renal measurements: 9.6 x 4.2 x 4.6 cm = volume: 96 mL. Echogenicity is increased. There is renal cortical thinning. No perinephric fluid or hydronephrosis visualized. There is a cyst arising from the upper pole left kidney measuring 2.6 x 2.7  x 2.2 cm. No sonographically demonstrable calculus or ureterectasis. Bladder: Appears normal for degree of bladder distention. Other: There is a right pleural effusion. IMPRESSION: 1. Kidneys are echogenic with renal cortical thinning on the left and low normal thickness on the right. These are findings indicative of medical renal disease. No obstructing focus in either kidney. 2.  Cysts in each kidney. 3. Left kidney is smaller than right kidney. Etiology for this finding is uncertain. This finding potentially may indicate renal artery stenosis on the left. In this regard, question whether patient is hypertensive. 4.  Right pleural effusion. Electronically Signed   By: Lowella Grip III M.D.   On: 06/11/2019 17:07   DG Chest Port 1 View  Result Date: 06/11/2019 CLINICAL DATA:  Shortness of breath EXAM: PORTABLE CHEST 1 VIEW COMPARISON:  12/02/2016 FINDINGS: Cardiomegaly. 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 Diffuse interstitial opacity with Kerley lines. Perihilar airspace disease that is likely alveolar edema. No visible effusion or pneumothorax. IMPRESSION: CHF. Electronically Signed   By: Monte Fantasia M.D.   On: 06/11/2019 07:20    Medications: Infusions: . furosemide Stopped (06/12/19 0600)  . nitroGLYCERIN 150 mcg/min (06/12/19 0740)    Scheduled Medications: . allopurinol  100 mg Oral Daily  . amLODipine  10 mg Oral Daily  . aspirin EC  81 mg Oral Daily  . carvedilol  25 mg Oral BID WC  . Chlorhexidine Gluconate Cloth  6 each Topical Daily  . cloNIDine  0.3 mg Oral TID  . doxazosin  4 mg Oral BID  . finasteride  5 mg Oral Daily  . fluticasone  2 spray Each Nare Daily  . gabapentin  100 mg Oral BID  . heparin  5,000 Units Subcutaneous Q8H  . hydrALAZINE  100 mg Oral TID AC & HS  . insulin aspart  0-5 Units Subcutaneous QHS  . insulin aspart  0-6 Units Subcutaneous TID WC  . isosorbide dinitrate  40 mg Oral TID  . levothyroxine  100 mcg Oral Daily  . rosuvastatin  20 mg Oral QPM  . sodium  bicarbonate  1,300 mg Oral BID  . tamsulosin  0.8 mg Oral Daily    have reviewed scheduled and prn medications.  Physical Exam: General:NAD, comfortable Heart:RRR, s1s2 nl, no rubs Lungs: Bibasal coarse breath sound, no wheezing Abdomen:soft, Non-tender, non-distended Extremities: Lower extremity chronic venous stasis changes.  Edema++ Neurology: Alert awake and following commands.  Dawne Casali Prasad Ioannis Schuh 06/12/2019,8:54 AM  LOS: 1 day  Pager: [phone]

## 2019-06-12 NOTE — Progress Notes (Signed)
06/12/2019 4:13 PM  Updated wife by telephone.  Pt will let us know tomorrow if he desires to return home with hospice or transition over to a residential hospice facility.   Wife verbalizes understanding.   Murvin Natal MD

## 2019-06-12 NOTE — Progress Notes (Addendum)
PROGRESS NOTE   [name].  GGE:[id number] DOB: 1945/09/13 DOA: 06/11/2019 PCP: Jeff Fraise, MD   Chief Complaint  Patient presents with  . Shortness of Breath    Brief Narrative:  [name]. is a 74 y.o. male with medical history of CKD stage V, coronary artery disease, COPD, diabetes mellitus type 2, hyperlipidemia, hypothyroidism, carotid stenosis presenting with 2-week history of shortness of breath that significantly worsened on the a.m. 06/11/2019.  The patient denies any fevers, chills, chest pain, nausea, vomiting, diarrhea, abdominal pain.  He also complains of increasing lower extremity edema, increasing abdominal girth, and orthopnea type symptoms.  He endorses compliance with all his medications.  He has a nonproductive cough, but denies hemoptysis.  He has told all his physicians that he is aware of his worsening renal function and does not want to pursue dialysis.  He came to the emergency department at the insistence of his spouse.  Assessment & Plan:   Active Problems:   Essential hypertension   Acute renal failure superimposed on stage 5 chronic kidney disease, not on chronic dialysis (HCC)   Acute pulmonary edema (HCC)   Acute respiratory failure with hypoxia (HCC)   Goals of care, counseling/discussion   Palliative care by specialist    Assessment/Plan: Acute on chronic renal failure--CKD stage V -Nephrology consult -Patient has capacity to make his own decisions, and he has expressed he does not want to be initiated on dialysis -furosemide 160 mg IV every 8 hours per nephrology  -Pt has progressed to ESRD but does not desire dialysis of any kind. He likely will not have any meaningful improvement.  Transfer out of ICU.  I spoke with palliative medicine team as he is now residential hospice appropriate however I suspect he will elect to go home with hospice.   Acute respiratory failure with hypoxia -Secondary to pulmonary edema -Stable on 4 L  nasal cannula  Hypertensive urgency -Continue home medications including clonidine, hydralazine, Isordil, carvedilol, amlodipine - DC IV nitroglycerin drip  Diabetes mellitus type 2 -Hemoglobin A1c 8.3%  -NovoLog sliding scale -Holding Lantus temporarily  BPH -Continue tamsulosin and finasteride  Coronary artery disease -No chest pain presently -EKG without concerning ischemic changes -Continue aspirin, carvedilol  Hypothyroidism -continue Synthroid   Goals of care discussion -Patient has capacity to make his own decisions presently -DNR confirmed Advance care planning, including the explanation and discussion of advance directives was carried out with the patient and family.  Code status including explanations of "Full Code" and "DNR" and alternatives were discussed in detail.  Discussion of end-of-life issues including but not limited palliative care, hospice care and the concept of hospice, other end-of-life care options, power of attorney for health care decisions, living wills, and physician orders for life-sustaining treatment were also discussed with the patient and family.   DVT prophylaxis:  Heparin  Code Status: DNR  Family Communication: multiple telephone calls to spouse and son, no answer, unable to leave message Disposition:   Status is: Inpatient  Remains inpatient appropriate because:IV treatments appropriate due to intensity of illness or inability to take PO   Dispo: The patient is from: Home              Anticipated d/c is to: Home              Anticipated d/c date is: 1 day              Patient currently is not medically stable to  d/c.   Consultants:   Nephrology  Palliative medicine  Procedures:     Antimicrobials:     Subjective: Pt reporting that he has no SOB or chest pain.  He has no appetite.   Objective: Vitals:   06/12/19 1230 06/12/19 1300 06/12/19 1330 06/12/19 1400  BP: (!) 180/62 (!) 180/62 (!) 181/63 (!) 179/67    Pulse: 73 77 75 75  Resp: (!) 24 15 17  (!) 37  Temp:      TempSrc:      SpO2: (!) 89% 90% 90% (!) 87%  Weight:      Height:        Intake/Output Summary (Last 24 hours) at 06/12/2019 1404 Last data filed at 06/12/2019 0948 Gross per 24 hour  Intake 1758.01 ml  Output 1100 ml  Net 658.01 ml   Filed Weights   06/11/19 0623 06/11/19 1347  Weight: 106.1 kg 106.1 kg    Examination:  General exam: Appears calm and comfortable  Respiratory system: Clear to auscultation. Respiratory effort normal. Cardiovascular system: S1 & S2 heard, RRR. No JVD, murmurs, rubs, gallops or clicks. No pedal edema. Gastrointestinal system: Abdomen is nondistended, soft and nontender. No organomegaly or masses felt. Normal bowel sounds heard. Central nervous system: Alert and oriented. No focal neurological deficits. Extremities: 1+ edema BLEs.  Symmetric 5 x 5 power. Skin: No rashes, lesions or ulcers Psychiatry: Judgement and insight appear normal. Mood & affect appropriate.    Data Reviewed: I have personally reviewed following labs and imaging studies  CBC: Recent Labs  Lab 06/11/19 0744 06/12/19 0408  WBC 6.2 4.7  NEUTROABS 5.2  --   HGB 8.1* 7.2*  HCT 25.9* 23.3*  MCV 101.6* 100.0  PLT 90* 83*    Basic Metabolic Panel: Recent Labs  Lab 06/11/19 0744 06/12/19 0408  NA 136 135  K 4.8 4.0  CL 107 106  CO2 16* 15*  GLUCOSE 105* 227*  BUN 112* 118*  CREATININE 4.91* 4.84*  CALCIUM 8.5* 8.8*  PHOS  --  6.5*    GFR: Estimated Creatinine Clearance: 17.1 mL/min (A) (by C-G formula based on SCr of 4.84 mg/dL (H)).  Liver Function Tests: Recent Labs  Lab 06/11/19 0744 06/12/19 0408  AST 16  --   ALT 13  --   ALKPHOS 64  --   BILITOT 0.8  --   PROT 8.3*  --   ALBUMIN 3.5 3.2*    CBG: Recent Labs  Lab 06/11/19 1641 06/11/19 2034 06/12/19 0727 06/12/19 1115  GLUCAP 155* 169* 233* 186*     Recent Results (from the past 240 hour(s))  Respiratory Panel by RT PCR  (Flu A&B, Covid) - Nasopharyngeal Swab     Status: None   Collection Time: 06/11/19  8:15 AM   Specimen: Nasopharyngeal Swab  Result Value Ref Range Status   SARS Coronavirus 2 by RT PCR NEGATIVE NEGATIVE Final    Comment: (NOTE) SARS-CoV-2 target nucleic acids are NOT DETECTED. The SARS-CoV-2 RNA is generally detectable in upper respiratoy specimens during the acute phase of infection. The lowest concentration of SARS-CoV-2 viral copies this assay can detect is 131 copies/mL. A negative result does not preclude SARS-Cov-2 infection and should not be used as the sole basis for treatment or other patient management decisions. A negative result may occur with  improper specimen collection/handling, submission of specimen other than nasopharyngeal swab, presence of viral mutation(s) within the areas targeted by this assay, and inadequate number of viral copies (<131 copies/mL).  A negative result must be combined with clinical observations, patient history, and epidemiological information. The expected result is Negative. Fact Sheet for Patients:  PinkCheek.be Fact Sheet for Healthcare Providers:  GravelBags.it This test is not yet ap proved or cleared by the [place] FDA and  has been authorized for detection and/or diagnosis of SARS-CoV-2 by FDA under an Emergency Use Authorization (EUA). This EUA will remain  in effect (meaning this test can be used) for the duration of the COVID-19 declaration under Section 564(b)(1) of the Act, 21 U.S.C. section 360bbb-3(b)(1), unless the authorization is terminated or revoked sooner.    Influenza A by PCR NEGATIVE NEGATIVE Final   Influenza B by PCR NEGATIVE NEGATIVE Final    Comment: (NOTE) The Xpert Xpress SARS-CoV-2/FLU/RSV assay is intended as an aid in  the diagnosis of influenza from Nasopharyngeal swab specimens and  should not be used as a sole basis for treatment. Nasal  washings and  aspirates are unacceptable for Xpert Xpress SARS-CoV-2/FLU/RSV  testing. Fact Sheet for Patients: PinkCheek.be Fact Sheet for Healthcare Providers: GravelBags.it This test is not yet approved or cleared by the [place] FDA and  has been authorized for detection and/or diagnosis of SARS-CoV-2 by  FDA under an Emergency Use Authorization (EUA). This EUA will remain  in effect (meaning this test can be used) for the duration of the  Covid-19 declaration under Section 564(b)(1) of the Act, 21  U.S.C. section 360bbb-3(b)(1), unless the authorization is  terminated or revoked. Performed at [facility], [address], [place], [place] [address]   MRSA PCR Screening     Status: None   Collection Time: 06/11/19  2:18 PM   Specimen: Nasal Mucosa; Nasopharyngeal  Result Value Ref Range Status   MRSA by PCR NEGATIVE NEGATIVE Final    Comment:        The GeneXpert MRSA Assay (FDA approved for NASAL specimens only), is one component of a comprehensive MRSA colonization surveillance program. It is not intended to diagnose MRSA infection nor to guide or monitor treatment for MRSA infections. Performed at [facility], [address] [place], [place] [address]     Radiology Studies: US RENAL  Result Date: 06/11/2019 CLINICAL DATA:  Acute exacerbation of renal failure. There is underlying chronic renal failure. EXAM: RENAL / URINARY TRACT ULTRASOUND COMPLETE COMPARISON:  August 09, 2016 FINDINGS: Right Kidney: Renal measurements: 12.4 x 4.5 x 5.0 cm = volume: 140 mL . Echogenicity mildly increased. Renal cortical thickness low normal. No perinephric fluid or hydronephrosis visualized. There is a cyst arising from the upper pole of the right kidney measuring 2.1 x 1.6 x 1.8 cm. A cyst in the lower pole region on the right measures 1.6 x 1.5 x 1.6 cm. No sonographically demonstrable calculus or ureterectasis. Left Kidney:  Renal measurements: 9.6 x 4.2 x 4.6 cm = volume: 96 mL. Echogenicity is increased. There is renal cortical thinning. No perinephric fluid or hydronephrosis visualized. There is a cyst arising from the upper pole left kidney measuring 2.6 x 2.7 x 2.2 cm. No sonographically demonstrable calculus or ureterectasis. Bladder: Appears normal for degree of bladder distention. Other: There is a right pleural effusion. IMPRESSION: 1. Kidneys are echogenic with renal cortical thinning on the left and low normal thickness on the right. These are findings indicative of medical renal disease. No obstructing focus in either kidney. 2.  Cysts in each kidney. 3. Left kidney is smaller than right kidney. Etiology for this finding is uncertain. This finding  potentially may indicate renal artery stenosis on the left. In this regard, question whether patient is hypertensive. 4.  Right pleural effusion. Electronically Signed   By: Lowella Grip III M.D.   On: 06/11/2019 17:07   DG Chest Port 1 View  Result Date: 06/11/2019 CLINICAL DATA:  Shortness of breath EXAM: PORTABLE CHEST 1 VIEW COMPARISON:  12/02/2016 FINDINGS: Cardiomegaly. 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 Diffuse interstitial opacity with Kerley lines. Perihilar airspace disease that is likely alveolar edema. No visible effusion or pneumothorax. IMPRESSION: CHF. Electronically Signed   By: Monte Fantasia M.D.   On: 06/11/2019 07:20   ECHOCARDIOGRAM COMPLETE  Result Date: 06/12/2019    ECHOCARDIOGRAM REPORT   Patient Name:   Dusten Ellinwood. Date of Exam: 06/12/2019 Medical Rec #:  [id number]         Height:       73.0 in Accession #:    [id number]        Weight:       233.9 lb Date of Birth:  Jun 15, 1945        BSA:          2.300 m Patient Age:    34 years          BP:           180/63 mmHg Patient Gender: M                 HR:           78 bpm. Exam Location:  [facility] Procedure: 2D Echo Indications:    CHF-Acute Diastolic 431.54 / M08.67  History:        Patient has prior  history of Echocardiogram examinations, most                 recent 10/18/2017. Prior CABG; Risk Factors:Hypertension, Former                 Smoker, Diabetes and Dyslipidemia. Acute renal failure, Acute                 respiratory failure.  Sonographer:    Leavy Cella RDCS (AE) Referring Phys: [phone] DAVID TAT IMPRESSIONS  1. Left ventricular ejection fraction, by estimation, is 60 to 65%. The left ventricle has normal function. The left ventricle has no regional wall motion abnormalities. There is severe left ventricular hypertrophy. Left ventricular diastolic parameters  are consistent with Grade II diastolic dysfunction (pseudonormalization).  2. Right ventricular systolic function is normal. The right ventricular size is normal. There is moderately elevated pulmonary artery systolic pressure.  3. Left atrial size was mildly dilated.  4. The mitral valve is normal in structure. Trivial mitral valve regurgitation. No evidence of mitral stenosis.  5. The aortic valve is tricuspid. Aortic valve regurgitation is not visualized. No aortic stenosis is present.  6. The inferior vena cava is dilated in size with <50% respiratory variability, suggesting right atrial pressure of 15 mmHg. FINDINGS  Left Ventricle: Left ventricular ejection fraction, by estimation, is 60 to 65%. The left ventricle has normal function. The left ventricle has no regional wall motion abnormalities. The left ventricular internal cavity size was normal in size. There is  severe left ventricular hypertrophy. Left ventricular diastolic parameters are consistent with Grade II diastolic dysfunction (pseudonormalization). Right Ventricle: The right ventricular size is normal. No increase in right ventricular wall thickness. Right ventricular systolic function is normal. There is moderately elevated pulmonary artery systolic pressure. The tricuspid regurgitant velocity is 3.04  m/s, and with an assumed right atrial pressure of 10 mmHg, the estimated  right ventricular systolic pressure is 95.0 mmHg. Left Atrium: Left atrial size was mildly dilated. Right Atrium: Right atrial size was normal in size. Pericardium: There is no evidence of pericardial effusion. Mitral Valve: The mitral valve is normal in structure. Trivial mitral valve regurgitation. No evidence of mitral valve stenosis. Tricuspid Valve: The tricuspid valve is normal in structure. Tricuspid valve regurgitation is mild . No evidence of tricuspid stenosis. Aortic Valve: The aortic valve is tricuspid. . There is mild thickening and mild calcification of the aortic valve. Aortic valve regurgitation is not visualized. No aortic stenosis is present. Mild aortic valve annular calcification. There is mild thickening of the aortic valve. There is mild calcification of the aortic valve. Aortic valve mean gradient measures 4.6 mmHg. Aortic valve peak gradient measures 8.2 mmHg. Aortic valve area, by VTI measures 2.13 cm. Pulmonic Valve: The pulmonic valve was not well visualized. Pulmonic valve regurgitation is mild. No evidence of pulmonic stenosis. Aorta: The aortic root is normal in size and structure. Pulmonary Artery: Moderate pulmonary HTN, PASP is 52 mmHg. Venous: The inferior vena cava is dilated in size with less than 50% respiratory variability, suggesting right atrial pressure of 15 mmHg. IAS/Shunts: No atrial level shunt detected by color flow Doppler.  LEFT VENTRICLE PLAX 2D LVIDd:         4.91 cm  Diastology LVIDs:         2.87 cm  LV e' lateral:   6.96 cm/s LV PW:         1.68 cm  LV E/e' lateral: 20.0 LV IVS:        1.56 cm  LV e' medial:    5.11 cm/s LVOT diam:     1.90 cm  LV E/e' medial:  27.2 LV SV:         73 LV SV Index:   32 LVOT Area:     2.84 cm  RIGHT VENTRICLE TAPSE (M-mode): 1.5 cm LEFT ATRIUM             Index LA diam:        4.40 cm 1.91 cm/m LA Vol (A2C):   70.0 ml 30.44 ml/m LA Vol (A4C):   61.0 ml 26.52 ml/m LA Biplane Vol: 70.3 ml 30.57 ml/m  AORTIC VALVE AV Area (Vmax):     2.14 cm AV Area (Vmean):   2.17 cm AV Area (VTI):     2.13 cm AV Vmax:           143.58 cm/s AV Vmean:          100.878 cm/s AV VTI:            0.341 m AV Peak Grad:      8.2 mmHg AV Mean Grad:      4.6 mmHg LVOT Vmax:         108.34 cm/s LVOT Vmean:        77.286 cm/s LVOT VTI:          0.256 m LVOT/AV VTI ratio: 0.75  AORTA Ao Root diam: 3.50 cm MITRAL VALVE                TRICUSPID VALVE MV Area (PHT): 4.71 cm     TR Peak grad:   37.0 mmHg MV Decel Time: 161 msec     TR Vmax:        304.00 cm/s MV E velocity: 139.00 cm/s MV A  velocity: 81.40 cm/s   SHUNTS MV E/A ratio:  1.71         Systemic VTI:  0.26 m                             Systemic Diam: 1.90 cm Carlyle Dolly MD Electronically signed by Carlyle Dolly MD Signature Date/Time: 06/12/2019/10:37:27 AM    Final    Scheduled Meds: . allopurinol  100 mg Oral Daily  . amLODipine  10 mg Oral Daily  . aspirin EC  81 mg Oral Daily  . calcium acetate  667 mg Oral TID WC  . carvedilol  25 mg Oral BID WC  . Chlorhexidine Gluconate Cloth  6 each Topical Daily  . cloNIDine  0.3 mg Oral TID  . darbepoetin (ARANESP) injection - NON-DIALYSIS  100 mcg Subcutaneous Once  . doxazosin  4 mg Oral BID  . finasteride  5 mg Oral Daily  . fluticasone  2 spray Each Nare Daily  . gabapentin  100 mg Oral BID  . heparin  5,000 Units Subcutaneous Q8H  . hydrALAZINE  100 mg Oral TID AC & HS  . insulin aspart  0-5 Units Subcutaneous QHS  . insulin aspart  0-6 Units Subcutaneous TID WC  . isosorbide dinitrate  40 mg Oral TID  . levothyroxine  100 mcg Oral Daily  . rosuvastatin  20 mg Oral QPM  . sodium bicarbonate  1,300 mg Oral TID  . tamsulosin  0.8 mg Oral Daily   Continuous Infusions: . ferric gluconate (FERRLECIT/NULECIT) IV 250 mg (06/12/19 1344)  . furosemide 120 mg (06/12/19 1349)     LOS: 1 day    Time spent: 35 minutes   Deidrick Rainey Wynetta Emery, MD [facility]   To contact the attending provider between 7A-7P or the covering  provider during after hours 7P-7A, please log into the web site [email or web address] and access using universal [place] password for that web site. If you do not have the password, please call the hospital operator.  06/12/2019, 2:04 PM

## 2019-06-12 NOTE — Progress Notes (Signed)
*  PRELIMINARY RESULTS* Echocardiogram 2D Echocardiogram has been performed.  [name] 06/12/2019, 9:40 AM

## 2019-06-12 NOTE — Progress Notes (Signed)
Palliative: Mr. Haik is lying quietly in bed.  He is sleeping soundly, but wakes easily as I enter.  He is alert and oriented, but tells me he is sleepy and would like to keep his eyes closed.  He is able to make his basic needs known.  There is no family at bedside at this time.   Mr. Lashomb tells me that he feels his breathing has improved, and he understands he will be returning to his home tomorrow.    We talked about the benefits of at home hospice for symptom management.  At this point he tells me he would consider at home hospice care for treat the treatable care, but he is unsure if his wife would be in agreement.  Conference with attending, bedside nursing staff related to patient condition, needs, goals of care.  Plan: Mr. Minihan declines to take hemodialysis, he knows that this will likely shorten his life.  Anticipate discharge home, likely tomorrow.  Mr. Helminiak states that he would like to be rehospitalized as needed if/when his breathing worsens again.  24 minutes Quinn Axe, NP Palliative Medicine Team Team Phone # [phone] Greater than 50% of this time was spent counseling and coordinating care related to the above assessment and plan.

## 2019-06-13 DIAGNOSIS — Z7189 Other specified counseling: Secondary | ICD-10-CM

## 2019-06-13 LAB — GLUCOSE, CAPILLARY
Glucose-Capillary: 248 mg/dL — ABNORMAL HIGH (ref 70–99)
Glucose-Capillary: 210 mg/dL — ABNORMAL HIGH (ref 70–99)
Glucose-Capillary: 170 mg/dL — ABNORMAL HIGH (ref 70–99)
Glucose-Capillary: 158 mg/dL — ABNORMAL HIGH (ref 70–99)

## 2019-06-13 MED ORDER — SODIUM BICARBONATE 650 MG PO TABS: 1300.0000 mg | ORAL_TABLET | Freq: Three times a day (TID) | ORAL | 0 refills | Status: AC

## 2019-06-13 MED ORDER — CALCIUM ACETATE (PHOS BINDER) 667 MG PO CAPS: 667.0000 mg | ORAL_CAPSULE | Freq: Three times a day (TID) | ORAL | 0 refills | Status: AC

## 2019-06-13 NOTE — Discharge Summary (Addendum)
Physician Discharge Summary  [name]. BHA:[id number] DOB: [birth year] DOA: 06/11/2019  PCP: Claretta Fraise, MD  Admit date: 06/11/2019 Discharge date: 06/13/2019  Admitted From:  Home  Disposition:  Home with hospice   Discharge Condition: HOSPICE   CODE STATUS: DNR    Brief Hospitalization Summary: Please see all hospital notes, images, labs for full details of the hospitalization. ADMISSION HPI:  Jeff Manley. is a 74 y.o. male with medical history of CKD stage V, coronary artery disease, COPD, diabetes mellitus type 2, hyperlipidemia, hypothyroidism, carotid stenosis presenting with 2-week history of shortness of breath that significantly worsened on the a.m. 06/11/2019.  The patient denies any fevers, chills, chest pain, nausea, vomiting, diarrhea, abdominal pain.  He also complains of increasing lower extremity edema, increasing abdominal girth, and orthopnea type symptoms.  He endorses compliance with all his medications.  He has a nonproductive cough, but denies hemoptysis.  He has told all his physicians that he is aware of his worsening renal function and does not want to pursue dialysis.  He came to the emergency department at the insistence of his spouse.  In the emergency department, the patient was afebrile hemodynamically stable.  He required 4 L nasal cannula to keep his oxygen saturation near 100%.  Initial blood pressure was 202/81.  LFTs were unremarkable.  BMP showed a serum creatinine 4.91 and potassium 4.8.  WBC 6.2, hemoglobin 8.1, platelets 90,000.  BNP was 963.  Chest x-ray show pulmonary edema.     Brief Narrative:  [name]. is a 74 y.o. male with medical history of CKD stage V, coronary artery disease, COPD, diabetes mellitus type 2, hyperlipidemia, hypothyroidism, carotid stenosis presenting with 2-week history of shortness of breath that significantly worsened on the a.m. 06/11/2019.  The patient denies any fevers, chills, chest pain, nausea, vomiting,  diarrhea, abdominal pain.  He also complains of increasing lower extremity edema, increasing abdominal girth, and orthopnea type symptoms.  He endorses compliance with all his medications.  He has a nonproductive cough, but denies hemoptysis.  He has told all his physicians that he is aware of his worsening renal function and does not want to pursue dialysis.  He came to the emergency department at the insistence of his spouse.   Assessment & Plan:   Active Problems:   Essential hypertension   Acute renal failure superimposed on stage 5 chronic kidney disease, not on chronic dialysis (HCC)   Acute pulmonary edema (HCC)   Acute respiratory failure with hypoxia (HCC)   Goals of care, counseling/discussion   Palliative care by specialist   Assessment/Plan: CKD stage V has now progressed to [place] -Nephrology consult appreciated, patient has adamantly declined renal replacement therapy -Patient has capacity to make his own decisions, and he has expressed he does not want to be initiated on dialysis -pt was initially treated with furosemide 160 mg IV every 8 hours per nephrology  -Pt has progressed to ESRD but does not desire dialysis of any kind. He likely will not have any meaningful improvement.   I spoke with palliative medicine team and patient desires to return home with hospice and would transition to residential hospice if he needed more support.    Acute respiratory failure with hypoxia -Secondary to pulmonary edema -Stable on 2-4 L nasal cannula   Hypertensive urgency - resolved  -Continue home medications including clonidine, hydralazine, Isordil, carvedilol, amlodipine   Diabetes mellitus type 2 -Hemoglobin A1c 8.3%  -NovoLog sliding  scale -Holding Lantus due to high risk for hypoglycemia   BPH -Continue tamsulosin and finasteride   Coronary artery disease -No chest pain presently -EKG without concerning ischemic changes -Continue aspirin, carvedilol    Hypothyroidism -continue Synthroid     Goals of care discussion -Patient has capacity to make his own decisions presently -DNR confirmed Advance care planning, including the explanation and discussion of advance directives was carried out with the patient and family.  Code status including explanations of "Full Code" and "DNR" and alternatives were discussed in detail.  Discussion of end-of-life issues including but not limited palliative care, hospice care and the concept of hospice, other end-of-life care options, power of attorney for health care decisions, living wills, and physician orders for life-sustaining treatment were also discussed with the patient and family.     DVT prophylaxis:  Heparin  Code Status: DNR  Family Communication: wife and son updated, verbalized understanding Disposition: Home with hospice   Discharge Diagnoses:  Active Problems:   Essential hypertension   Acute renal failure superimposed on stage 5 chronic kidney disease, not on chronic dialysis ([place])   Acute pulmonary edema (HCC)   Acute respiratory failure with hypoxia (HCC)   Goals of care, counseling/discussion   Palliative care by specialist   Discharge Instructions:   Allergies as of 06/13/2019       Reactions   Sulfa Antibiotics Hives        Medication List     STOP taking these medications    Lantus SoloStar 100 UNIT/ML Solostar Pen Generic drug: insulin glargine       TAKE these medications    acetaminophen 325 MG tablet Commonly known as: TYLENOL Take 2 tablets (650 mg total) by mouth every 6 (six) hours as needed for mild pain.   allopurinol 100 MG tablet Commonly known as: ZYLOPRIM Take 1 tablet (100 mg total) by mouth daily.   Amitiza 24 MCG capsule Generic drug: lubiprostone Take 1 capsule (24 mcg total) by mouth 2 (two) times daily.   amLODipine 10 MG tablet Commonly known as: NORVASC Take 1 tablet (10 mg total) by mouth daily.   aspirin 81 MG EC tablet Take  81 mg by mouth daily. Swallow whole.   betamethasone valerate 0.1 % cream Commonly known as: VALISONE USE AS DIRECTED What changed:  how much to take how to take this when to take this reasons to take this   calcium acetate 667 MG capsule Commonly known as: PHOSLO Take 1 capsule (667 mg total) by mouth 3 (three) times daily with meals.   carvedilol 25 MG tablet Commonly known as: COREG TAKE 1 & 1/2 TABLET BY MOUTH TWICE A DAY What changed:  how much to take how to take this when to take this   cloNIDine 0.1 MG tablet Commonly known as: CATAPRES TAKE 2 TABLETS BY MOUTH 3 TIMES A DAY. What changed:  how much to take how to take this when to take this   doxazosin 4 MG tablet Commonly known as: CARDURA Take 1 tablet (4 mg total) by mouth 2 (two) times daily.   finasteride 5 MG tablet Commonly known as: PROSCAR Take 1 tablet (5 mg total) by mouth daily.   fluticasone 50 MCG/ACT nasal spray Commonly known as: FLONASE USE 2 SPRAYS IN EACH NOSTRIL AT BEDTIME What changed:  how much to take how to take this when to take this   furosemide 80 MG tablet Commonly known as: LASIX Two on awakening in the morning and  one at lunchtime What changed:  how much to take how to take this when to take this   gabapentin 100 MG capsule Commonly known as: NEURONTIN TAKE 1 CAPSULE (100 MG TOTAL) BY MOUTH THREE (3) TIMES A DAY. What changed:  how much to take how to take this when to take this   hydrALAZINE 100 MG tablet Commonly known as: APRESOLINE Take 1 tablet (100 mg total) by mouth 4 (four) times daily -  before meals and at bedtime.   isosorbide dinitrate 20 MG tablet Commonly known as: ISORDIL Take 2 tablets (40 mg total) by mouth 3 (three) times daily.   levothyroxine 100 MCG tablet Commonly known as: SYNTHROID Take 1 tablet (100 mcg total) by mouth daily.   OneTouch Ultra test strip Generic drug: glucose blood USE AS INSTRUCTED   rosuvastatin 20 MG  tablet Commonly known as: CRESTOR Take 1 tablet (20 mg total) by mouth every evening.   sodium bicarbonate 650 MG tablet Take 2 tablets (1,300 mg total) by mouth 3 (three) times daily.   tamsulosin 0.4 MG Caps capsule Commonly known as: FLOMAX Take 2 capsules (0.8 mg total) by mouth daily. What changed:  how much to take when to take this               Durable Medical Equipment  (From admission, onward)           Start     Ordered   06/13/19 1050  For home use only DME Hospital bed  Once    Question Answer Comment  Length of Need Lifetime   The above medical condition requires: Patient requires the ability to reposition frequently   Head must be elevated greater than: 30 degrees   Bed type Semi-electric      06/13/19 1049             Allergies  Allergen Reactions   Sulfa Antibiotics Hives   Allergies as of 06/13/2019       Reactions   Sulfa Antibiotics Hives        Medication List     STOP taking these medications    Lantus SoloStar 100 UNIT/ML Solostar Pen Generic drug: insulin glargine       TAKE these medications    acetaminophen 325 MG tablet Commonly known as: TYLENOL Take 2 tablets (650 mg total) by mouth every 6 (six) hours as needed for mild pain.   allopurinol 100 MG tablet Commonly known as: ZYLOPRIM Take 1 tablet (100 mg total) by mouth daily.   Amitiza 24 MCG capsule Generic drug: lubiprostone Take 1 capsule (24 mcg total) by mouth 2 (two) times daily.   amLODipine 10 MG tablet Commonly known as: NORVASC Take 1 tablet (10 mg total) by mouth daily.   aspirin 81 MG EC tablet Take 81 mg by mouth daily. Swallow whole.   betamethasone valerate 0.1 % cream Commonly known as: VALISONE USE AS DIRECTED What changed:  how much to take how to take this when to take this reasons to take this   calcium acetate 667 MG capsule Commonly known as: PHOSLO Take 1 capsule (667 mg total) by mouth 3 (three) times daily with  meals.   carvedilol 25 MG tablet Commonly known as: COREG TAKE 1 & 1/2 TABLET BY MOUTH TWICE A DAY What changed:  how much to take how to take this when to take this   cloNIDine 0.1 MG tablet Commonly known as: CATAPRES TAKE 2 TABLETS BY MOUTH 3 TIMES  A DAY. What changed:  how much to take how to take this when to take this   doxazosin 4 MG tablet Commonly known as: CARDURA Take 1 tablet (4 mg total) by mouth 2 (two) times daily.   finasteride 5 MG tablet Commonly known as: PROSCAR Take 1 tablet (5 mg total) by mouth daily.   fluticasone 50 MCG/ACT nasal spray Commonly known as: FLONASE USE 2 SPRAYS IN EACH NOSTRIL AT BEDTIME What changed:  how much to take how to take this when to take this   furosemide 80 MG tablet Commonly known as: LASIX Two on awakening in the morning and one at lunchtime What changed:  how much to take how to take this when to take this   gabapentin 100 MG capsule Commonly known as: NEURONTIN TAKE 1 CAPSULE (100 MG TOTAL) BY MOUTH THREE (3) TIMES A DAY. What changed:  how much to take how to take this when to take this   hydrALAZINE 100 MG tablet Commonly known as: APRESOLINE Take 1 tablet (100 mg total) by mouth 4 (four) times daily -  before meals and at bedtime.   isosorbide dinitrate 20 MG tablet Commonly known as: ISORDIL Take 2 tablets (40 mg total) by mouth 3 (three) times daily.   levothyroxine 100 MCG tablet Commonly known as: SYNTHROID Take 1 tablet (100 mcg total) by mouth daily.   OneTouch Ultra test strip Generic drug: glucose blood USE AS INSTRUCTED   rosuvastatin 20 MG tablet Commonly known as: CRESTOR Take 1 tablet (20 mg total) by mouth every evening.   sodium bicarbonate 650 MG tablet Take 2 tablets (1,300 mg total) by mouth 3 (three) times daily.   tamsulosin 0.4 MG Caps capsule Commonly known as: FLOMAX Take 2 capsules (0.8 mg total) by mouth daily. What changed:  how much to take when to take  this               Durable Medical Equipment  (From admission, onward)           Start     Ordered   06/13/19 1050  For home use only DME Hospital bed  Once    Question Answer Comment  Length of Need Lifetime   The above medical condition requires: Patient requires the ability to reposition frequently   Head must be elevated greater than: 30 degrees   Bed type Semi-electric      06/13/19 1049            Procedures/Studies: US RENAL  Result Date: 06/11/2019 CLINICAL DATA:  Acute exacerbation of renal failure. There is underlying chronic renal failure. EXAM: RENAL / URINARY TRACT ULTRASOUND COMPLETE COMPARISON:  August 09, 2016 FINDINGS: Right Kidney: Renal measurements: 12.4 x 4.5 x 5.0 cm = volume: 140 mL . Echogenicity mildly increased. Renal cortical thickness low normal. No perinephric fluid or hydronephrosis visualized. There is a cyst arising from the upper pole of the right kidney measuring 2.1 x 1.6 x 1.8 cm. A cyst in the lower pole region on the right measures 1.6 x 1.5 x 1.6 cm. No sonographically demonstrable calculus or ureterectasis. Left Kidney: Renal measurements: 9.6 x 4.2 x 4.6 cm = volume: 96 mL. Echogenicity is increased. There is renal cortical thinning. No perinephric fluid or hydronephrosis visualized. There is a cyst arising from the upper pole left kidney measuring 2.6 x 2.7 x 2.2 cm. No sonographically demonstrable calculus or ureterectasis. Bladder: Appears normal for degree of bladder distention. Other: There is a right  pleural effusion. IMPRESSION: 1. Kidneys are echogenic with renal cortical thinning on the left and low normal thickness on the right. These are findings indicative of medical renal disease. No obstructing focus in either kidney. 2.  Cysts in each kidney. 3. Left kidney is smaller than right kidney. Etiology for this finding is uncertain. This finding potentially may indicate renal artery stenosis on the left. In this regard, question  whether patient is hypertensive. 4.  Right pleural effusion. Electronically Signed   By: Lowella Grip III M.D.   On: 06/11/2019 17:07   DG Chest Port 1 View  Result Date: 06/11/2019 CLINICAL DATA:  Shortness of breath EXAM: PORTABLE CHEST 1 VIEW COMPARISON:  12/02/2016 FINDINGS: Cardiomegaly. 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 Diffuse interstitial opacity with Kerley lines. Perihilar airspace disease that is likely alveolar edema. No visible effusion or pneumothorax. IMPRESSION: CHF. Electronically Signed   By: Monte Fantasia M.D.   On: 06/11/2019 07:20   ECHOCARDIOGRAM COMPLETE  Result Date: 06/12/2019    ECHOCARDIOGRAM REPORT   Patient Name:   Jeff Petteway. Date of Exam: 06/12/2019 Medical Rec #:  [id number]         Height:       73.0 in Accession #:    [id number]        Weight:       233.9 lb Date of Birth:  08/05/1945        BSA:          2.300 m Patient Age:    58 years          BP:           180/63 mmHg Patient Gender: M                 HR:           78 bpm. Exam Location:  [facility] Procedure: 2D Echo Indications:    CHF-Acute Diastolic 470.96 / G83.66  History:        Patient has prior history of Echocardiogram examinations, most                 recent 10/18/2017. Prior CABG; Risk Factors:Hypertension, Former                 Smoker, Diabetes and Dyslipidemia. Acute renal failure, Acute                 respiratory failure.  Sonographer:    Leavy Cella RDCS (AE) Referring Phys: [phone] DAVID TAT IMPRESSIONS  1. Left ventricular ejection fraction, by estimation, is 60 to 65%. The left ventricle has normal function. The left ventricle has no regional wall motion abnormalities. There is severe left ventricular hypertrophy. Left ventricular diastolic parameters  are consistent with Grade II diastolic dysfunction (pseudonormalization).  2. Right ventricular systolic function is normal. The right ventricular size is normal. There is moderately elevated pulmonary artery systolic pressure.  3. Left atrial size was  mildly dilated.  4. The mitral valve is normal in structure. Trivial mitral valve regurgitation. No evidence of mitral stenosis.  5. The aortic valve is tricuspid. Aortic valve regurgitation is not visualized. No aortic stenosis is present.  6. The inferior vena cava is dilated in size with <50% respiratory variability, suggesting right atrial pressure of 15 mmHg. FINDINGS  Left Ventricle: Left ventricular ejection fraction, by estimation, is 60 to 65%. The left ventricle has normal function. The left ventricle has no regional wall motion abnormalities. The left ventricular internal cavity size  was normal in size. There is  severe left ventricular hypertrophy. Left ventricular diastolic parameters are consistent with Grade II diastolic dysfunction (pseudonormalization). Right Ventricle: The right ventricular size is normal. No increase in right ventricular wall thickness. Right ventricular systolic function is normal. There is moderately elevated pulmonary artery systolic pressure. The tricuspid regurgitant velocity is 3.04 m/s, and with an assumed right atrial pressure of 10 mmHg, the estimated right ventricular systolic pressure is 40.1 mmHg. Left Atrium: Left atrial size was mildly dilated. Right Atrium: Right atrial size was normal in size. Pericardium: There is no evidence of pericardial effusion. Mitral Valve: The mitral valve is normal in structure. Trivial mitral valve regurgitation. No evidence of mitral valve stenosis. Tricuspid Valve: The tricuspid valve is normal in structure. Tricuspid valve regurgitation is mild . No evidence of tricuspid stenosis. Aortic Valve: The aortic valve is tricuspid. . There is mild thickening and mild calcification of the aortic valve. Aortic valve regurgitation is not visualized. No aortic stenosis is present. Mild aortic valve annular calcification. There is mild thickening of the aortic valve. There is mild calcification of the aortic valve. Aortic valve mean gradient  measures 4.6 mmHg. Aortic valve peak gradient measures 8.2 mmHg. Aortic valve area, by VTI measures 2.13 cm. Pulmonic Valve: The pulmonic valve was not well visualized. Pulmonic valve regurgitation is mild. No evidence of pulmonic stenosis. Aorta: The aortic root is normal in size and structure. Pulmonary Artery: Moderate pulmonary HTN, PASP is 52 mmHg. Venous: The inferior vena cava is dilated in size with less than 50% respiratory variability, suggesting right atrial pressure of 15 mmHg. IAS/Shunts: No atrial level shunt detected by color flow Doppler.  LEFT VENTRICLE PLAX 2D LVIDd:         4.91 cm  Diastology LVIDs:         2.87 cm  LV e' lateral:   6.96 cm/s LV PW:         1.68 cm  LV E/e' lateral: 20.0 LV IVS:        1.56 cm  LV e' medial:    5.11 cm/s LVOT diam:     1.90 cm  LV E/e' medial:  27.2 LV SV:         73 LV SV Index:   32 LVOT Area:     2.84 cm  RIGHT VENTRICLE TAPSE (M-mode): 1.5 cm LEFT ATRIUM             Index LA diam:        4.40 cm 1.91 cm/m LA Vol (A2C):   70.0 ml 30.44 ml/m LA Vol (A4C):   61.0 ml 26.52 ml/m LA Biplane Vol: 70.3 ml 30.57 ml/m  AORTIC VALVE AV Area (Vmax):    2.14 cm AV Area (Vmean):   2.17 cm AV Area (VTI):     2.13 cm AV Vmax:           143.58 cm/s AV Vmean:          100.878 cm/s AV VTI:            0.341 m AV Peak Grad:      8.2 mmHg AV Mean Grad:      4.6 mmHg LVOT Vmax:         108.34 cm/s LVOT Vmean:        77.286 cm/s LVOT VTI:          0.256 m LVOT/AV VTI ratio: 0.75  AORTA Ao Root diam: 3.50 cm MITRAL VALVE  TRICUSPID VALVE MV Area (PHT): 4.71 cm     TR Peak grad:   37.0 mmHg MV Decel Time: 161 msec     TR Vmax:        304.00 cm/s MV E velocity: 139.00 cm/s MV A velocity: 81.40 cm/s   SHUNTS MV E/A ratio:  1.71         Systemic VTI:  0.26 m                             Systemic Diam: 1.90 cm Carlyle Dolly MD Electronically signed by Carlyle Dolly MD Signature Date/Time: 06/12/2019/10:37:27 AM    Final      Subjective: Pt says he desires to  go home with hospice.  He feels fine today. No complaints.    Discharge Exam: Vitals:   06/13/19 1030 06/13/19 1045  BP:    Pulse: 69 66  Resp: 18 20  Temp:    SpO2: 98% 95%   Vitals:   06/13/19 0815 06/13/19 0845 06/13/19 1030 06/13/19 1045  BP:      Pulse: 84 77 69 66  Resp: (!) 24 (!) 22 18 20   Temp:      TempSrc:      SpO2: 94% (!) 89% 98% 95%  Weight:      Height:       General exam: Appears calm and comfortable  Respiratory system: Clear to auscultation. Respiratory effort normal. Cardiovascular system: S1 & S2 heard, RRR. No JVD, murmurs, rubs, gallops or clicks. No pedal edema. Gastrointestinal system: Abdomen is nondistended, soft and nontender. No organomegaly or masses felt. Normal bowel sounds heard. Central nervous system: Alert and oriented. No focal neurological deficits. Extremities: 1+ edema BLEs.  Symmetric 5 x 5 power. Skin: No rashes, lesions or ulcers Psychiatry: Judgement and insight appear normal. Mood & affect appropriate.    The results of significant diagnostics from this hospitalization (including imaging, microbiology, ancillary and laboratory) are listed below for reference.     Microbiology: Recent Results (from the past 240 hour(s))  Respiratory Panel by RT PCR (Flu A&B, Covid) - Nasopharyngeal Swab     Status: None   Collection Time: 06/11/19  8:15 AM   Specimen: Nasopharyngeal Swab  Result Value Ref Range Status   SARS Coronavirus 2 by RT PCR NEGATIVE NEGATIVE Final    Comment: (NOTE) SARS-CoV-2 target nucleic acids are NOT DETECTED. The SARS-CoV-2 RNA is generally detectable in upper respiratoy specimens during the acute phase of infection. The lowest concentration of SARS-CoV-2 viral copies this assay can detect is 131 copies/mL. A negative result does not preclude SARS-Cov-2 infection and should not be used as the sole basis for treatment or other patient management decisions. A negative result may occur with  improper specimen  collection/handling, submission of specimen other than nasopharyngeal swab, presence of viral mutation(s) within the areas targeted by this assay, and inadequate number of viral copies (<131 copies/mL). A negative result must be combined with clinical observations, patient history, and epidemiological information. The expected result is Negative. Fact Sheet for Patients:  PinkCheek.be Fact Sheet for Healthcare Providers:  GravelBags.it This test is not yet ap proved or cleared by the [place] FDA and  has been authorized for detection and/or diagnosis of SARS-CoV-2 by FDA under an Emergency Use Authorization (EUA). This EUA will remain  in effect (meaning this test can be used) for the duration of the COVID-19 declaration under Section 564(b)(1) of the Act,  21 U.S.C. section 360bbb-3(b)(1), unless the authorization is terminated or revoked sooner.    Influenza A by PCR NEGATIVE NEGATIVE Final   Influenza B by PCR NEGATIVE NEGATIVE Final    Comment: (NOTE) The Xpert Xpress SARS-CoV-2/FLU/RSV assay is intended as an aid in  the diagnosis of influenza from Nasopharyngeal swab specimens and  should not be used as a sole basis for treatment. Nasal washings and  aspirates are unacceptable for Xpert Xpress SARS-CoV-2/FLU/RSV  testing. Fact Sheet for Patients: PinkCheek.be Fact Sheet for Healthcare Providers: GravelBags.it This test is not yet approved or cleared by the [place] FDA and  has been authorized for detection and/or diagnosis of SARS-CoV-2 by  FDA under an Emergency Use Authorization (EUA). This EUA will remain  in effect (meaning this test can be used) for the duration of the  Covid-19 declaration under Section 564(b)(1) of the Act, 21  U.S.C. section 360bbb-3(b)(1), unless the authorization is  terminated or revoked. Performed at [facility],  [address] [place], [place] [address]   MRSA PCR Screening     Status: None   Collection Time: 06/11/19  2:18 PM   Specimen: Nasal Mucosa; Nasopharyngeal  Result Value Ref Range Status   MRSA by PCR NEGATIVE NEGATIVE Final    Comment:        The GeneXpert MRSA Assay (FDA approved for NASAL specimens only), is one component of a comprehensive MRSA colonization surveillance program. It is not intended to diagnose MRSA infection nor to guide or monitor treatment for MRSA infections. Performed at [facility], [address], [place], [place] [address]      Labs: BNP (last 3 results) Recent Labs    04/30/19 0919 06/11/19 0744  BNP 672.3* 169.6*   Basic Metabolic Panel: Recent Labs  Lab 06/11/19 0744 06/12/19 0408  NA 136 135  K 4.8 4.0  CL 107 106  CO2 16* 15*  GLUCOSE 105* 227*  BUN 112* 118*  CREATININE 4.91* 4.84*  CALCIUM 8.5* 8.8*  PHOS  --  6.5*   Liver Function Tests: Recent Labs  Lab 06/11/19 0744 06/12/19 0408  AST 16  --   ALT 13  --   ALKPHOS 64  --   BILITOT 0.8  --   PROT 8.3*  --   ALBUMIN 3.5 3.2*   No results for input(s): LIPASE, AMYLASE in the last 168 hours. No results for input(s): AMMONIA in the last 168 hours. CBC: Recent Labs  Lab 06/11/19 0744 06/12/19 0408  WBC 6.2 4.7  NEUTROABS 5.2  --   HGB 8.1* 7.2*  HCT 25.9* 23.3*  MCV 101.6* 100.0  PLT 90* 83*   Cardiac Enzymes: No results for input(s): CKTOTAL, CKMB, CKMBINDEX, TROPONINI in the last 168 hours. BNP: Invalid input(s): POCBNP CBG: Recent Labs  Lab 06/12/19 0727 06/12/19 1115 06/12/19 1625 06/12/19 2133 06/13/19 0723  GLUCAP 233* 186* 234* 167* 248*   D-Dimer No results for input(s): DDIMER in the last 72 hours. Hgb A1c Recent Labs    06/11/19 0744  HGBA1C 8.3*   Lipid Profile No results for input(s): CHOL, HDL, LDLCALC, TRIG, CHOLHDL, LDLDIRECT in the last 72 hours. Thyroid function studies No results for input(s): TSH, T4TOTAL, T3FREE, THYROIDAB in  the last 72 hours.  Invalid input(s): FREET3 Anemia work up Recent Labs    06/12/19 0408  FERRITIN 235  TIBC 194*  IRON 21*   Urinalysis    Component Value Date/Time   COLORURINE STRAW (A) 12/02/2016 0250   APPEARANCEUR  Clear 12/19/2018 1100   LABSPEC 1.009 12/02/2016 0250   PHURINE 5.0 12/02/2016 0250   GLUCOSEU Negative 12/19/2018 1100   HGBUR NEGATIVE 12/02/2016 0250   BILIRUBINUR Negative 12/19/2018 1100   KETONESUR NEGATIVE 12/02/2016 0250   PROTEINUR 1+ (A) 12/19/2018 1100   PROTEINUR NEGATIVE 12/02/2016 0250   UROBILINOGEN 0.2 10/03/2006 1445   NITRITE Negative 12/19/2018 1100   NITRITE NEGATIVE 12/02/2016 0250   LEUKOCYTESUR Negative 12/19/2018 1100   Sepsis Labs Invalid input(s): PROCALCITONIN,  WBC,  LACTICIDVEN Microbiology Recent Results (from the past 240 hour(s))  Respiratory Panel by RT PCR (Flu A&B, Covid) - Nasopharyngeal Swab     Status: None   Collection Time: 06/11/19  8:15 AM   Specimen: Nasopharyngeal Swab  Result Value Ref Range Status   SARS Coronavirus 2 by RT PCR NEGATIVE NEGATIVE Final    Comment: (NOTE) SARS-CoV-2 target nucleic acids are NOT DETECTED. The SARS-CoV-2 RNA is generally detectable in upper respiratoy specimens during the acute phase of infection. The lowest concentration of SARS-CoV-2 viral copies this assay can detect is 131 copies/mL. A negative result does not preclude SARS-Cov-2 infection and should not be used as the sole basis for treatment or other patient management decisions. A negative result may occur with  improper specimen collection/handling, submission of specimen other than nasopharyngeal swab, presence of viral mutation(s) within the areas targeted by this assay, and inadequate number of viral copies (<131 copies/mL). A negative result must be combined with clinical observations, patient history, and epidemiological information. The expected result is Negative. Fact Sheet for Patients:   PinkCheek.be Fact Sheet for Healthcare Providers:  GravelBags.it This test is not yet ap proved or cleared by the [place] FDA and  has been authorized for detection and/or diagnosis of SARS-CoV-2 by FDA under an Emergency Use Authorization (EUA). This EUA will remain  in effect (meaning this test can be used) for the duration of the COVID-19 declaration under Section 564(b)(1) of the Act, 21 U.S.C. section 360bbb-3(b)(1), unless the authorization is terminated or revoked sooner.    Influenza A by PCR NEGATIVE NEGATIVE Final   Influenza B by PCR NEGATIVE NEGATIVE Final    Comment: (NOTE) The Xpert Xpress SARS-CoV-2/FLU/RSV assay is intended as an aid in  the diagnosis of influenza from Nasopharyngeal swab specimens and  should not be used as a sole basis for treatment. Nasal washings and  aspirates are unacceptable for Xpert Xpress SARS-CoV-2/FLU/RSV  testing. Fact Sheet for Patients: PinkCheek.be Fact Sheet for Healthcare Providers: GravelBags.it This test is not yet approved or cleared by the [place] FDA and  has been authorized for detection and/or diagnosis of SARS-CoV-2 by  FDA under an Emergency Use Authorization (EUA). This EUA will remain  in effect (meaning this test can be used) for the duration of the  Covid-19 declaration under Section 564(b)(1) of the Act, 21  U.S.C. section 360bbb-3(b)(1), unless the authorization is  terminated or revoked. Performed at [facility], [address], [place], [place] [address]   MRSA PCR Screening     Status: None   Collection Time: 06/11/19  2:18 PM   Specimen: Nasal Mucosa; Nasopharyngeal  Result Value Ref Range Status   MRSA by PCR NEGATIVE NEGATIVE Final    Comment:        The GeneXpert MRSA Assay (FDA approved for NASAL specimens only), is one component of a comprehensive MRSA  colonization surveillance program. It is not intended to diagnose MRSA infection nor to guide or monitor treatment for MRSA  infections. Performed at [facility], [address], [place], [place] [address]    Time coordinating discharge: 36 mins  SIGNED:  Irwin Brakeman, MD  [facility] 06/13/2019, 10:57 AM How to contact the [facility] Attending or Consulting provider [place] or covering provider during after hours [place], for this patient?  Check the care team in [facility] and look for a) attending/consulting TRH provider listed and b) the [facility] team listed Log into [email or web address] and use [place]'s universal password to access. If you do not have the password, please contact the hospital operator. Locate the [facility] provider you are looking for under [facility] and page to a number that you can be directly reached. If you still have difficulty reaching the provider, please page the [facility] (Director on Call) for the Hospitalists listed on amion for assistance.

## 2019-06-13 NOTE — Discharge Instructions (Signed)
End-Stage Kidney Disease End-stage kidney disease occurs when the kidneys are so damaged that they cannot function and cannot get better. This condition may also be referred to as end-stage renal disease or ESRD. The kidneys are two organs that do many important jobs in the body, including:  Removing wastes and extra fluids from the blood.  Making hormones that maintain the amount of fluid in your tissues and blood vessels.  Maintaining the right amount of fluids and chemicals in the body. Without functioning kidneys, toxins build up in the blood and life-threatening complications can occur. What are the causes? This condition usually occurs when a long-term (chronic) kidney disease gets worse and results in permanent damage to the kidneys. It may also be caused by sudden damage to the kidneys (acute kidney injury). Causes of this condition include:  Having a family history of chronic kidney disease (CKD).  Having chronic kidney disease for many years.  Chronic medical conditions that affect the kidneys, such as: ? Cardiovascular disease, including high blood pressure. ? Diabetes. ? Certain diseases that affect the body's disease-fighting (immune) system.  Overuse of over-the-counter pain medicines.  Being around or being in contact with poisonous (toxic) substances. What increases the risk? The following factors may make you more likely to develop this condition:  Being older than 60.  Being male.  Being of African-American, Asian, Native American, [place], or Hispanic descent.  Smoking or a history of smoking.  Obesity. What are the signs or symptoms? Symptoms of this condition include:  Swelling (edema) of the face, legs, ankles, or feet.  Numbness, tingling, or loss of feeling in the hands or feet.  Tiredness (lethargy).  Nausea or vomiting.  Confusion, trouble concentrating, or loss of consciousness.  Chest pain.  Shortness of breath.  Passing  little or no urine.  Muscle twitches and cramps, especially in the legs.  Dry, itchy skin.  Loss of appetite.  Pale skin due to anemia, including the skin and tissue around the eye (conjunctiva).  Headaches.  Abnormally dark or light skin.  Decrease in muscle size (muscle wasting).  Easy bruising.  Frequent hiccups.  Stopping of the monthly period in women.  Jerky movements (seizures). How is this diagnosed? This condition may be diagnosed based on:  A physical exam, including blood pressure measurements.  Urine tests.  Blood tests.  Imaging tests.  A test in which a sample of tissue is removed from the kidneys to be examined under a microscope (kidney biopsy). How is this treated? This condition may be treated with:  A procedure that removes toxic wastes from the body (dialysis). There are two types of dialysis: ? Dialysis that is done through your abdomen (peritoneal dialysis). This may be done several times a day. ? Dialysis that is done by a machine (hemodialysis). This may be done several times a week.  Surgery to receive a new kidney (kidney transplant). In addition to having dialysis or a kidney transplant, you may need to take medicines:  To control high blood pressure (hypertension).  To control high cholesterol.  To treat diabetes.  To maintain healthy levels of minerals in the blood (electrolytes). You may also be given a specific meal plan to follow that includes requirements or limits for:  Salt (sodium).  Protein.  Phosphorous.  Potassium.  Calcium. Follow these instructions at home: Medicines  Take over-the-counter and prescription medicines only as told by your health care provider.  Do not take any new medicines, vitamins, or mineral supplements unless  approved by your health care provider. Many medicines and supplements can worsen kidney damage.  Follow instructions from your health care provider about adjusting the doses of any  medicines you take. Lifestyle  Do not use any products that contain nicotine or tobacco, such as cigarettes and e-cigarettes. If you need help quitting, ask your health care provider.  Achieve and maintain a healthy weight. If you need help with this, ask your health care provider.  Start or continue an exercise plan. Exercise at least 30 minutes a day, 5 days a week.  Follow your prescribed meal plan. General instructions  Stay current with your shots (immunizations) as told by your health care provider.  Keep track of your blood pressure. Report changes in your blood pressure as told by your health care provider.  If you are being treated for diabetes, monitor and track your blood sugar (blood glucose) levels as told by your health care provider.  Keep all follow-up visits as told by your health care provider. This is important. Where to find more information  American Association of Kidney Patients: BombTimer.gl  National Kidney Foundation: www.kidney.[place]: https://mathis.com/  Life Options [facility]: www.lifeoptions.org and www.kidneyschool.org Contact a health care provider if:  Your symptoms get worse.  You develop new symptoms. Get help right away if:  You have weakness in an arm or leg on one side of your body.  You have difficulty speaking or you are slurring your speech.  You have a sudden change in your vision.  You have a sudden, severe headache.  You have a sudden weight increase.  You have difficulty breathing.  Your symptoms suddenly get worse. Summary  End-stage kidney disease occurs when the kidneys are so damaged that they cannot function and cannot get better.  Without functioning kidneys, toxins build up in the blood and life-threatening complications can occur.  Treatment may include dialysis or a kidney transplant along with medicines and lifestyle changes. This information is not intended to replace advice  given to you by your health care provider. Make sure you discuss any questions you have with your health care provider. Document Revised: 01/13/2017 Document Reviewed: 03/08/2016 Elsevier Patient Education  2020 [employer].

## 2019-06-13 NOTE — TOC Progression Note (Signed)
Transition of Care (TOC) - Progression Note    Patient Details  Name: [name]. MRN: [id number] Date of Birth: 08/07/1945  Transition [facility]) CM/SW Contact  Shade Flood, LCSW Phone Number: 06/13/2019, 4:19 PM  Clinical Narrative:     Pt referred to [facility] earlier today at pt/family request. Plan is for home with hospice care. [place] delivered all DME except hospital bed to pt's home today. [place] providing extended length bed due to pt's height and the bed will not be available until tomorrow. Family aware.  Updated RN and MD. Donella Stade will follow up tomorrow.    Barriers to Discharge: Equipment Delay  Expected Discharge Plan and Services           Expected Discharge Date: 06/13/19                                     Social Determinants of Health (SDOH) Interventions    Readmission Risk Interventions No flowsheet data found.

## 2019-06-13 NOTE — Progress Notes (Signed)
Palliative: [name] is lying quietly in bed.  He is resting comfortably, but wakes easily.  Today at bedside his wife [name] and son [name].  We talked about [name] chronic kidney disease, we also talked about flash pulmonary edema.  We talked about fluid restrictions.  We talked about disposition home versus transition to residential hospice.  It seems that [name] got up to the [facility] chair this morning with his walker and 1 person assist.  I share that at this point, he does not qualify for residential hospice.  We talked about transitioning to residential hospice once he declines.  Patient and family are in agreement.  Patient and family are requesting hospice of [facility] for in-home hospice services.  We talked about equipment needs.  They would like a hospital bed.  I encourage family to work with hospice for any further needs, which can usually be met quickly.  We talked about logistics of delivery and transfer.  We talked about prognosis.  I share that weeks, (4-8) would be anticipated, but it also would not be surprising if [name] developed pulmonary edema again.  We talked about calling hospice for symptom management first.  Conference with attending, bedside nursing staff, transition of care team related to patient condition, needs, goals of care, disposition with hospice care.  Plan:    Patient and family are requesting hospice of [facility] for in-home hospice services, transition to residential hospice when appropriate.  36 minutes Jeff Axe, NP Palliative Medicine Team Team Phone # [phone] Greater than 50% of this time was spent counseling and coordinating care related to the above assessment and plan.

## 2019-06-13 NOTE — Evaluation (Addendum)
Physical Therapy Evaluation Patient Details Name: [name]. MRN: [id number] DOB: Jul 01, 1945 Today's Date: 06/13/2019   History of Present Illness  [name]. is a 74 y.o. male with medical history of CKD stage V, coronary artery disease, COPD, diabetes mellitus type 2, hyperlipidemia, hypothyroidism, carotid stenosis presenting with 2-week history of shortness of breath that significantly worsened on the a.m. 06/11/2019.  The patient denies any fevers, chills, chest pain, nausea, vomiting, diarrhea, abdominal pain.  He also complains of increasing lower extremity edema, increasing abdominal girth, and orthopnea type symptoms.  He endorses compliance with all his medications.  He has a nonproductive cough, but denies hemoptysis.  He has told all his physicians that he is aware of his worsening renal function and does not want to pursue dialysis.  He came to the emergency department at the insistence of his spouse.In the emergency department, the patient was afebrile hemodynamically stable.  He required 4 L nasal cannula to keep his oxygen saturation near 100%.  Initial blood pressure was 202/81.  LFTs were unremarkable.  BMP showed a serum creatinine 4.91 and potassium 4.8.  WBC 6.2, hemoglobin 8.1, platelets 90,000.  BNP was 963.  Chest x-ray show pulmonary edema.    Clinical Impression  Patient unsteady on feet with leaning to the left and at high risk for falls, limited to a few slow unsteady steps at bedside and tolerated sitting up in chair after therapy - RN notified.  Plan:  Patient to be discharged home today on hospice care and discharged from physical therapy to care of nursing for OOB to chair daily as tolerated for length of stay.     Follow Up Recommendations SNF;Supervision for mobility/OOB;Supervision - Intermittent    Equipment Recommendations  None recommended by PT    Recommendations for Other Services       Precautions / Restrictions Precautions Precautions:  Fall Restrictions Weight Bearing Restrictions: No      Mobility  Bed Mobility Overal bed mobility: Needs Assistance Bed Mobility: Supine to Sit     Supine to sit: Mod assist     General bed mobility comments: increased time, labored movement  Transfers Overall transfer level: Needs assistance Equipment used: Rolling walker (2 wheeled) Transfers: Sit to/from [facility] Sit to Stand: Mod assist Stand pivot transfers: Mod assist       General transfer comment: slow labored movement  Ambulation/Gait Ambulation/Gait assistance: Mod assist;Max assist Gait Distance (Feet): 4 Feet Assistive device: Rolling walker (2 wheeled) Gait Pattern/deviations: Decreased step length - right;Decreased step length - left;Decreased stride length Gait velocity: decreased   General Gait Details: limited to 4-5 slow unsteady labored steps with leaning to the left, limited secondary to fatigue/poor standing balance  Stairs            Wheelchair Mobility    Modified Rankin (Stroke Patients Only)       Balance Overall balance assessment: Needs assistance Sitting-balance support: Feet supported;No upper extremity supported Sitting balance-Leahy Scale: Fair Sitting balance - Comments: seated at EOB Postural control: Left lateral lean Standing balance support: During functional activity;Bilateral upper extremity supported Standing balance-Leahy Scale: Poor Standing balance comment: fair/poor using RW                             Pertinent Vitals/Pain Pain Assessment: Faces Faces Pain Scale: Hurts little more Pain Location: posterior neck Pain Descriptors / Indicators: Aching;Sore Pain Intervention(s): Limited activity within patient's tolerance;Monitored  during session    [place] expects to be discharged to:: Private residence Living Arrangements: Spouse/significant other Available Help at Discharge: Family;Available 24  hours/day Type of Home: Apartment Home Access: Stairs to enter Entrance Stairs-Rails: None Entrance Stairs-Number of Steps: 3 Home Layout: One level Home Equipment: Walker - 2 wheels;Cane - single point      Prior Function Level of Independence: Independent with assistive device(s)         Comments: [place] ambulator with SPC, uses RW for longer distances     Hand Dominance   Dominant Hand: Right    Extremity/Trunk Assessment   Upper Extremity Assessment Upper Extremity Assessment: Generalized weakness    Lower Extremity Assessment Lower Extremity Assessment: Generalized weakness    Cervical / Trunk Assessment Cervical / Trunk Assessment: Normal  Communication   Communication: No difficulties  Cognition Arousal/Alertness: Awake/alert Behavior During Therapy: WFL for tasks assessed/performed Overall Cognitive Status: Within Functional Limits for tasks assessed                                        General Comments      Exercises     Assessment/Plan    PT Assessment Patient needs continued PT services  PT Problem List Decreased strength;Decreased activity tolerance;Decreased balance;Decreased mobility       PT Treatment Interventions Gait training;Stair training;Functional mobility training;Therapeutic activities;Therapeutic exercise;Patient/family education;Balance training    PT Goals (Current goals can be found in the Care Plan section)  Acute Rehab PT Goals Patient Stated Goal: return home with family to assist PT Goal Formulation: With patient Time For Goal Achievement: 06/27/19 Potential to Achieve Goals: Fair    Frequency Min 3X/week   Barriers to discharge        Co-evaluation               AM-PAC PT "6 Clicks" Mobility  Outcome Measure Help needed turning from your back to your side while in a flat bed without using bedrails?: A Lot Help needed moving from lying on your back to sitting on the side of a flat bed  without using bedrails?: A Lot Help needed moving to and from a bed to a chair (including a wheelchair)?: A Lot Help needed standing up from a chair using your arms (e.g., wheelchair or bedside chair)?: A Lot Help needed to walk in hospital room?: A Lot Help needed climbing 3-5 steps with a railing? : Total 6 Click Score: 11    End of Session Equipment Utilized During Treatment: Oxygen Activity Tolerance: Patient tolerated treatment well;Patient limited by fatigue Patient left: in chair;with call bell/phone within reach Nurse Communication: Mobility status PT Visit Diagnosis: Unsteadiness on feet (R26.81);Other abnormalities of gait and mobility (R26.89);Muscle weakness (generalized) (M62.81)    Time: 0830-0900 PT Time Calculation (min) (ACUTE ONLY): 30 min   Charges:   PT Evaluation $PT Eval Moderate Complexity: 1 Mod PT Treatments $Therapeutic Activity: 23-37 mins        11:05 AM, 06/13/19 Lonell Grandchild, MPT Physical Therapist with [facility] 336 [phone] office [phone] mobile phone

## 2019-06-13 NOTE — Progress Notes (Signed)
Pt has order to be Med surge- bed request placed. Will hold d/t pts BP 189/64 after Labetalol given. Will pass on to [facility]

## 2019-06-13 NOTE — Progress Notes (Signed)
Pt is to go home with hospice as he does not wish to pursue renal replacement therapy.  Nothing further to add and will sign off.  Please call with questions or concerns.

## 2019-06-14 LAB — GLUCOSE, CAPILLARY
Glucose-Capillary: 180 mg/dL — ABNORMAL HIGH (ref 70–99)
Glucose-Capillary: 211 mg/dL — ABNORMAL HIGH (ref 70–99)

## 2019-06-14 NOTE — Care Management Important Message (Signed)
Important Message  Patient Details  Name: [name]. MRN: [id number] Date of Birth: Feb 14, 1946   Medicare Important Message Given:  Yes     Tommy Medal 06/14/2019, 10:39 AM

## 2019-06-14 NOTE — TOC Progression Note (Signed)
Transition of Care (TOC) - Progression Note    Patient Details  Name: [name]. MRN: [id number] Date of Birth: 1945/11/07  Transition of Care [facility]) CM/SW [place], LCSW Phone Number: 06/14/2019, 12:58 PM  Clinical Narrative:   CSW in contact with Cassandra from [facility] Cassandra informed CSW that the hospital bed would be arriving today by 2pm.   Patient will be discharging home today with hospice. CSW will contact family and make them aware as well.   Will continue to follow.  [place] Transitions of Care  Clinical Social Worker  Ph: [phone]      Barriers to Discharge: Equipment Delay  Expected Discharge Plan and Services           Expected Discharge Date: 06/13/19                                     Social Determinants of Health (SDOH) Interventions    Readmission Risk Interventions No flowsheet data found.

## 2019-06-15 DIAGNOSIS — Z743 Need for continuous supervision: Secondary | ICD-10-CM | POA: Diagnosis not present

## 2019-06-15 DIAGNOSIS — I10 Essential (primary) hypertension: Secondary | ICD-10-CM | POA: Diagnosis not present

## 2019-06-16 ENCOUNTER — Other Ambulatory Visit: Payer: Self-pay | Admitting: Family Medicine

## 2019-06-16 DIAGNOSIS — I10 Essential (primary) hypertension: Secondary | ICD-10-CM

## 2019-06-16 DIAGNOSIS — N185 Chronic kidney disease, stage 5: Secondary | ICD-10-CM

## 2019-06-20 ENCOUNTER — Telehealth: Payer: Self-pay | Admitting: Family Medicine

## 2019-07-16 DEATH — deceased

## 2019-07-30 ENCOUNTER — Ambulatory Visit: Payer: Medicare Other | Admitting: Family Medicine
